# Patient Record
Sex: Male | Born: 1975 | Race: White | Hispanic: No | Marital: Married | Smoking: Current every day smoker
Health system: Southern US, Community
[De-identification: ages and names within clinical notes are randomized; demographics above are authoritative.]

## PROBLEM LIST (undated history)

## (undated) DIAGNOSIS — F1010 Alcohol abuse, uncomplicated: Secondary | ICD-10-CM

## (undated) DIAGNOSIS — F32A Depression, unspecified: Secondary | ICD-10-CM

## (undated) DIAGNOSIS — J189 Pneumonia, unspecified organism: Secondary | ICD-10-CM

## (undated) DIAGNOSIS — K219 Gastro-esophageal reflux disease without esophagitis: Secondary | ICD-10-CM

## (undated) DIAGNOSIS — J40 Bronchitis, not specified as acute or chronic: Secondary | ICD-10-CM

## (undated) DIAGNOSIS — Z87442 Personal history of urinary calculi: Secondary | ICD-10-CM

## (undated) DIAGNOSIS — F329 Major depressive disorder, single episode, unspecified: Secondary | ICD-10-CM

## (undated) DIAGNOSIS — E119 Type 2 diabetes mellitus without complications: Secondary | ICD-10-CM

## (undated) DIAGNOSIS — F419 Anxiety disorder, unspecified: Secondary | ICD-10-CM

## (undated) DIAGNOSIS — Z8489 Family history of other specified conditions: Secondary | ICD-10-CM

## (undated) DIAGNOSIS — F1910 Other psychoactive substance abuse, uncomplicated: Secondary | ICD-10-CM

## (undated) DIAGNOSIS — I10 Essential (primary) hypertension: Secondary | ICD-10-CM

## (undated) DIAGNOSIS — M109 Gout, unspecified: Secondary | ICD-10-CM

## (undated) DIAGNOSIS — R45851 Suicidal ideations: Secondary | ICD-10-CM

## (undated) HISTORY — PX: OTHER SURGICAL HISTORY: SHX169

## (undated) HISTORY — PX: UPPER GASTROINTESTINAL ENDOSCOPY: SHX188

## (undated) NOTE — *Deleted (*Deleted)
01/25/2020 8:50 AM   Alan Eaton 07/18/1975 [id number]  Referring provider: Barbette Reichmann, MD [address] [facility] [place],  [place] [address] No chief complaint on file.   HPI: Alan Eaton is a 40 y.o. male who presents today for evaluation and management of nocturnal enuresis.   -The patient saw Dr. Marcello Fennel back in 09/2019 for bilateral testicle pain. -Pain was better with elevation.  -Exam noted scrotum only had minimal swelling but no erythema, warmth, or tendernes -He reported going to the ED for testicle pain but imaging was stable with some chronic inflammatory changes from prior orchitis.  -He reported some dysuria and no hematuria. -He had some urinary frequency.  -Patient was treated with Levaquin 500 mg daily x 7 days. STI testing was negative.   1. Nocturnal enuresis   PMH: Past Medical History:  Diagnosis Date  . Alcohol abuse   . Bronchitis   . Depression   . Diabetes mellitus without complication (HCC)    lost weight diet mgmt at present  . Drug abuse (HCC)   . Family history of adverse reaction to anesthesia   . GERD (gastroesophageal reflux disease)   . Gout   . Hypertension   . Morbid obesity (HCC)   . Suicide ideation     Surgical History: Past Surgical History:  Procedure Laterality Date  . none    . TRICEPS TENDON REPAIR Right 09/26/2018   Procedure: Debridement and primary repair of partial-thickness right distal triceps tendon tear. ;  Surgeon: Christena Flake, MD;  Location: [facility] ORS;  Service: Orthopedics;  Laterality: Right;  . UPPER GASTROINTESTINAL ENDOSCOPY      Home Medications:  Allergies as of 01/25/2020      Reactions   Bee Venom Swelling   Bactrim [sulfamethoxazole-trimethoprim] Swelling, Rash      Medication List       Accurate as of January 24, 2020  8:50 AM. If you have any questions, ask your nurse or doctor.        albuterol 108 (90 Base) MCG/ACT inhaler Commonly known as: Ventolin HFA  Inhale 2 puffs into the lungs every 4 (four) hours as needed for wheezing or shortness of breath.   allopurinol 100 MG tablet Commonly known as: ZYLOPRIM Take 100 mg by mouth daily.   allopurinol 300 MG tablet Commonly known as: ZYLOPRIM Take 300 mg by mouth daily.   amLODipine 5 MG tablet Commonly known as: NORVASC Take 5 mg by mouth daily.   cloNIDine 0.1 MG tablet Commonly known as: CATAPRES Take 0.1 mg by mouth 2 (two) times daily.   colchicine 0.6 MG tablet Take 1 tablet (0.6 mg total) by mouth daily.   etodolac 500 MG tablet Commonly known as: LODINE Take 500 mg by mouth 2 (two) times daily.   furosemide 80 MG tablet Commonly known as: LASIX Take 80 mg by mouth daily.   gabapentin 300 MG capsule Commonly known as: NEURONTIN TAKE 1 CAPSULE BY MOUTH 3 TIMES DAILY   hydrOXYzine 50 MG tablet Commonly known as: ATARAX/VISTARIL Take 60 mg by mouth daily.   metoprolol succinate 50 MG 24 hr tablet Commonly known as: TOPROL-XL TAKE 1 TABLET BY MOUTH ONCE DAILY   NIFEdipine 60 MG 24 hr tablet Commonly known as: PROCARDIA XL/NIFEDICAL XL Take 1 tablet (60 mg total) by mouth daily.   pantoprazole 40 MG tablet Commonly known as: PROTONIX Take 1 tablet (40 mg total) by mouth daily.   promethazine 25 MG tablet Commonly known as:  PHENERGAN Take 25 mg by mouth as needed for nausea or vomiting.   traZODone 50 MG tablet Commonly known as: DESYREL TAKE 1 TABLET BY MOUTH AT BEDTIME AS NEEDED SLEEP   vitamin B-12 1000 MCG tablet Commonly known as: CYANOCOBALAMIN Take 1 tablet (1,000 mcg total) by mouth daily.   Vivitrol 380 MG Susr Generic drug: Naltrexone 380 mg every 30 (thirty) days.       Allergies:  Allergies  Allergen Reactions  . Bee Venom Swelling  . Bactrim [Sulfamethoxazole-Trimethoprim] Swelling and Rash    Family History: Family History  Problem Relation Age of Onset  . Hypertension Other   . Diabetes Mellitus II Other     Social History:   reports that he has been smoking cigarettes. He has been smoking about 1.00 pack per day. His smokeless tobacco use includes snuff. He reports previous alcohol use. He reports current drug use. Frequency: 1.00 time per week. Drugs: Marijuana and Cocaine.   Physical Exam: There were no vitals taken for this visit.  Constitutional:  Alert and oriented, No acute distress. HEENT: [place] AT, moist mucus membranes.  Trachea midline, no masses. Cardiovascular: No clubbing, cyanosis, or edema. Respiratory: Normal respiratory effort, no increased work of breathing. GI: Abdomen is soft, nontender, nondistended, no abdominal masses GU: No CVA tenderness Lymph: No cervical or inguinal lymphadenopathy. Skin: No rashes, bruises or suspicious lesions. Neurologic: Grossly intact, no focal deficits, moving all 4 extremities. Psychiatric: Normal mood and affect.  Laboratory Data:  Lab Results  Component Value Date   CREATININE 1.49 (H) 10/14/2019    No results found for: PSA  No results found for: TESTOSTERONE  Lab Results  Component Value Date   HGBA1C 6.0 06/14/2016    Urinalysis   Pertinent Imaging: *** No results found for this or any previous visit.  No results found for this or any previous visit.  No results found for this or any previous visit.  No results found for this or any previous visit.  No results found for this or any previous visit.  No results found for this or any previous visit.  No results found for this or any previous visit.  No results found for this or any previous visit.   Assessment & Plan:     No follow-ups on file.  [facility] [address], [address] [place], [place] [address] [phone]  I, Theador Hawthorne, am acting as a scribe for Dr. Lorin Picket C. Stoioff,  {Add Holiday representative

---

## 2007-05-08 ENCOUNTER — Ambulatory Visit: Payer: Self-pay | Admitting: Gastroenterology

## 2007-05-15 ENCOUNTER — Ambulatory Visit: Payer: Self-pay | Admitting: Gastroenterology

## 2008-02-22 ENCOUNTER — Emergency Department: Payer: Self-pay | Admitting: Unknown Physician Specialty

## 2009-04-06 ENCOUNTER — Emergency Department: Payer: Self-pay | Admitting: Emergency Medicine

## 2009-09-01 ENCOUNTER — Emergency Department: Payer: Self-pay | Admitting: Emergency Medicine

## 2010-02-10 ENCOUNTER — Emergency Department: Payer: Self-pay | Admitting: Emergency Medicine

## 2010-02-23 ENCOUNTER — Emergency Department: Payer: Self-pay | Admitting: Emergency Medicine

## 2010-09-12 ENCOUNTER — Emergency Department: Payer: Self-pay | Admitting: Emergency Medicine

## 2012-04-02 ENCOUNTER — Emergency Department: Payer: Self-pay | Admitting: Emergency Medicine

## 2012-08-03 ENCOUNTER — Ambulatory Visit: Payer: Self-pay | Admitting: Gastroenterology

## 2012-08-04 LAB — PATHOLOGY REPORT

## 2012-08-30 ENCOUNTER — Ambulatory Visit: Payer: Self-pay | Admitting: Bariatrics

## 2012-08-30 LAB — CBC WITH DIFFERENTIAL/PLATELET
Basophil #: 0 10*3/uL (ref 0.0–0.1)
Basophil %: 0.2 %
Eosinophil %: 4 %
HCT: 41.1 % (ref 40.0–52.0)
HGB: 13.4 g/dL (ref 13.0–18.0)
Lymphocyte #: 2.7 10*3/uL (ref 1.0–3.6)
Lymphocyte %: 19.3 %
MCH: 28.5 pg (ref 26.0–34.0)
MCHC: 32.7 g/dL (ref 32.0–36.0)
MCV: 87 fL (ref 80–100)
Monocyte %: 6.6 %
Neutrophil #: 9.8 10*3/uL — ABNORMAL HIGH (ref 1.4–6.5)
RBC: 4.71 10*6/uL (ref 4.40–5.90)
WBC: 13.9 10*3/uL — ABNORMAL HIGH (ref 3.8–10.6)

## 2012-08-30 LAB — COMPREHENSIVE METABOLIC PANEL
Albumin: 3.7 g/dL (ref 3.4–5.0)
Alkaline Phosphatase: 77 U/L (ref 50–136)
Calcium, Total: 9.4 mg/dL (ref 8.5–10.1)
Chloride: 105 mmol/L (ref 98–107)
Co2: 27 mmol/L (ref 21–32)
Creatinine: 1.28 mg/dL (ref 0.60–1.30)
EGFR (Non-African Amer.): >60
Glucose: 121 mg/dL — ABNORMAL HIGH (ref 65–99)
Osmolality: 275 (ref 275–301)
Potassium: 3.6 mmol/L (ref 3.5–5.1)
SGOT(AST): 17 U/L (ref 15–37)
SGPT (ALT): 38 U/L (ref 12–78)
Total Protein: 8.4 g/dL — ABNORMAL HIGH (ref 6.4–8.2)

## 2012-08-30 LAB — APTT: Activated PTT: 31.3 secs (ref 23.6–35.9)

## 2012-08-30 LAB — IRON AND TIBC
Iron Bind.Cap.(Total): 339 ug/dL (ref 250–450)
Iron Saturation: 6 %
Iron: 22 ug/dL — ABNORMAL LOW (ref 65–175)
Unbound Iron-Bind.Cap.: 317 ug/dL

## 2012-08-30 LAB — FOLATE: Folic Acid: 40.2 ng/mL (ref 3.1–100.0)

## 2012-08-30 LAB — HEMOGLOBIN A1C: Hemoglobin A1C: 6.3 % (ref 4.2–6.3)

## 2012-08-30 LAB — PROTIME-INR: INR: 1

## 2012-08-30 LAB — MAGNESIUM: Magnesium: 1.5 mg/dL — ABNORMAL LOW

## 2012-08-30 LAB — TSH: Thyroid Stimulating Horm: 1.95 u[IU]/mL

## 2012-08-30 LAB — LIPASE, BLOOD: Lipase: 206 U/L (ref 73–393)

## 2012-09-10 ENCOUNTER — Emergency Department: Payer: Self-pay | Admitting: Internal Medicine

## 2012-09-15 ENCOUNTER — Ambulatory Visit: Payer: Self-pay | Admitting: Family Medicine

## 2012-10-22 ENCOUNTER — Inpatient Hospital Stay: Payer: Self-pay | Admitting: Psychiatry

## 2012-10-22 LAB — URINALYSIS, COMPLETE
Bilirubin,UR: NEGATIVE
Blood: NEGATIVE
Glucose,UR: NEGATIVE mg/dL (ref 0–75)
Ketone: NEGATIVE
Leukocyte Esterase: NEGATIVE
Nitrite: NEGATIVE
Specific Gravity: 1.003 (ref 1.003–1.030)

## 2012-10-22 LAB — CBC
HGB: 14.3 g/dL (ref 13.0–18.0)
MCHC: 33.9 g/dL (ref 32.0–36.0)
MCV: 85 fL (ref 80–100)
Platelet: 333 10*3/uL (ref 150–440)
Platelet: 336 10*3/uL (ref 150–440)
RBC: 4.77 10*6/uL (ref 4.40–5.90)
RDW: 15.5 % — ABNORMAL HIGH (ref 11.5–14.5)
WBC: 12.1 10*3/uL — ABNORMAL HIGH (ref 3.8–10.6)

## 2012-10-22 LAB — COMPREHENSIVE METABOLIC PANEL
Albumin: 3.6 g/dL (ref 3.4–5.0)
Anion Gap: 7 (ref 7–16)
BUN: 6 mg/dL — ABNORMAL LOW (ref 7–18)
Chloride: 108 mmol/L — ABNORMAL HIGH (ref 98–107)
Co2: 27 mmol/L (ref 21–32)
Creatinine: 1.29 mg/dL (ref 0.60–1.30)
EGFR (African American): >60
EGFR (African American): >60
EGFR (Non-African Amer.): >60
EGFR (Non-African Amer.): >60
Glucose: 102 mg/dL — ABNORMAL HIGH (ref 65–99)
Osmolality: 281 (ref 275–301)
Potassium: 3.7 mmol/L (ref 3.5–5.1)
Sodium: 142 mmol/L (ref 136–145)
Total Protein: 8.4 g/dL — ABNORMAL HIGH (ref 6.4–8.2)

## 2012-10-22 LAB — DRUG SCREEN, URINE
MDMA (Ecstasy)Ur Screen: NEGATIVE (ref ?–500)
Methadone, Ur Screen: NEGATIVE (ref ?–300)
Opiate, Ur Screen: NEGATIVE (ref ?–300)

## 2012-11-29 ENCOUNTER — Other Ambulatory Visit: Payer: Self-pay | Admitting: Bariatrics

## 2012-11-29 DIAGNOSIS — R12 Heartburn: Secondary | ICD-10-CM

## 2012-12-05 ENCOUNTER — Other Ambulatory Visit: Payer: Self-pay

## 2012-12-26 ENCOUNTER — Other Ambulatory Visit: Payer: Self-pay

## 2013-01-05 ENCOUNTER — Inpatient Hospital Stay: Admission: RE | Admit: 2013-01-05 | Payer: Self-pay | Source: Ambulatory Visit

## 2013-02-02 ENCOUNTER — Ambulatory Visit: Payer: Self-pay | Admitting: Bariatrics

## 2013-08-17 ENCOUNTER — Ambulatory Visit: Payer: Self-pay | Admitting: Bariatrics

## 2013-10-12 ENCOUNTER — Emergency Department: Payer: Self-pay | Admitting: Emergency Medicine

## 2013-10-12 LAB — CBC
HCT: 41.5 % (ref 40.0–52.0)
HGB: 13.4 g/dL (ref 13.0–18.0)
MCH: 28.9 pg (ref 26.0–34.0)
MCHC: 32.3 g/dL (ref 32.0–36.0)
MCV: 89 fL (ref 80–100)
Platelet: 350 10*3/uL (ref 150–440)
RBC: 4.64 10*6/uL (ref 4.40–5.90)
RDW: 16.1 % — ABNORMAL HIGH (ref 11.5–14.5)
WBC: 14.7 10*3/uL — ABNORMAL HIGH (ref 3.8–10.6)

## 2013-10-12 LAB — COMPREHENSIVE METABOLIC PANEL
ALBUMIN: 3.4 g/dL (ref 3.4–5.0)
ALT: 41 U/L
ANION GAP: 8 (ref 7–16)
Alkaline Phosphatase: 65 U/L
BUN: 5 mg/dL — ABNORMAL LOW (ref 7–18)
Bilirubin,Total: 0.2 mg/dL (ref 0.2–1.0)
Calcium, Total: 9 mg/dL (ref 8.5–10.1)
Chloride: 106 mmol/L (ref 98–107)
Co2: 26 mmol/L (ref 21–32)
Creatinine: 1.29 mg/dL (ref 0.60–1.30)
GLUCOSE: 75 mg/dL (ref 65–99)
Osmolality: 275 (ref 275–301)
Potassium: 3.4 mmol/L — ABNORMAL LOW (ref 3.5–5.1)
SGOT(AST): 28 U/L (ref 15–37)
SODIUM: 140 mmol/L (ref 136–145)
Total Protein: 8.3 g/dL — ABNORMAL HIGH (ref 6.4–8.2)

## 2013-10-12 LAB — URINALYSIS, COMPLETE
BILIRUBIN, UR: NEGATIVE
Bacteria: NONE SEEN
Blood: NEGATIVE
GLUCOSE, UR: NEGATIVE mg/dL (ref 0–75)
Ketone: NEGATIVE
LEUKOCYTE ESTERASE: NEGATIVE
Nitrite: NEGATIVE
Ph: 6 (ref 4.5–8.0)
Protein: NEGATIVE
RBC,UR: NONE SEEN /HPF (ref 0–5)
Specific Gravity: 1.001 (ref 1.003–1.030)
Squamous Epithelial: NONE SEEN
WBC UR: <1 /HPF (ref 0–5)

## 2013-10-12 LAB — CBC WITH DIFFERENTIAL/PLATELET
BASOS ABS: 0.1 10*3/uL (ref 0.0–0.1)
BASOS PCT: 0.7 %
EOS ABS: 0.8 10*3/uL — AB (ref 0.0–0.7)
EOS PCT: 5.4 %
LYMPHS ABS: 3.8 10*3/uL — AB (ref 1.0–3.6)
Lymphocyte %: 25.9 %
Monocyte #: 1.2 x10 3/mm — ABNORMAL HIGH (ref 0.2–1.0)
Monocyte %: 8.2 %
NEUTROS PCT: 59.8 %
Neutrophil #: 8.8 10*3/uL — ABNORMAL HIGH (ref 1.4–6.5)

## 2013-10-12 LAB — ETHANOL
ETHANOL %: 0.161 % — AB (ref 0.000–0.080)
ETHANOL LVL: 161 mg/dL

## 2013-10-12 LAB — PROTIME-INR
INR: 1
Prothrombin Time: 13.1 secs (ref 11.5–14.7)

## 2013-10-12 LAB — PRO B NATRIURETIC PEPTIDE: B-TYPE NATIURETIC PEPTID: 32 pg/mL (ref 0–125)

## 2013-10-12 LAB — TROPONIN I: Troponin-I: <0.02 ng/mL

## 2013-10-12 LAB — D-DIMER(ARMC): D-DIMER: 2047 ng/mL

## 2014-01-26 ENCOUNTER — Emergency Department: Payer: Self-pay | Admitting: Emergency Medicine

## 2014-06-12 DIAGNOSIS — Z008 Encounter for other general examination: Secondary | ICD-10-CM | POA: Diagnosis not present

## 2014-07-10 ENCOUNTER — Ambulatory Visit
Admit: 2014-07-10 | Disposition: A | Discharge status: Home / Self Care | Payer: Self-pay | Attending: Family Medicine | Admitting: Family Medicine

## 2014-07-12 NOTE — Discharge Summary (Signed)
PATIENT NAME:  Alan Eaton, Alan Eaton MR#:  [id number] DATE OF BIRTH:  Jan 17, 1976  HOSPITAL COURSE: See dictated history and physical for details of admission. This 39 year old gentleman was admitted to through the Emergency Room intoxicated and making hostile, agitated statements. The patient was irritable and uncooperative at first. He was admitted and treated for alcohol withdrawal. He was able to detox without difficulty, not requiring any significant amount of medication. No sign of seizures or delirium.   At first, he stayed withdrawn and somewhat isolated and was irritated when he spoke to his family on the phone. I spoke with his mother who reports that the patient has a long-standing problem with drinking and of acting angry and hostile when he is intoxicated. There is no evident history of suicidal behavior. No history of psychosis.   The patient is not reporting a syndrome of major depression and does not appear to be psychotic. No psychiatric medication was started for mood or anxiety treatment during his hospital treatment stay.   The patient was engaged in groups for substance abuse education and had a social work evaluation. The patient also has been educated about the fact that if he continues to drink, he is unlikely to be a candidate for gastric bypass surgery, which is something he had been looking forward to. He expresses an understanding of that.   At the time of discharge, he has calmed down and is no longer making hostile angry statements. His affect is more appropriate. He is agreeable to going to outpatient treatment at [facility] in their intensive outpatient program for substance abuse (Dictation Anomaly)<<He show improvedMISSING TEXT>> insight. He is looking forward to getting home and being with his girlfriend and family.   MENTAL STATUS: Casually dressed, somewhat disheveled gentleman, looks his stated age, cooperative but passive in the interview. Eye contact normal. Psychomotor  activity a little slow. Speech decreased in total amount but easy to understand, more than when he came in. Affect is euthymic and appropriate. Mood is stated as being better. Thoughts appear to be lucid. No loosening of associations or evidence of psychotic thinking. Denies hallucinations. Denies suicidal or homicidal ideation. Judgment and insight are improved. Intelligence probably low average. Alert and oriented x4.   DISCHARGE MEDICATIONS: Reglan 10 mg one tablet with each meal and at bedtime, metoprolol extended release 25 mg once a day, Pepcid 40 mg at night, omeprazole 40 mg at night.   LABORATORY RESULTS: Admission labs showed an alcohol level of 187. TSH normal at 2.0. Chemistry panel showed a slightly elevated glucose at 102, chloride slightly elevated 108, total protein 8.4. Drug screen was negative. CBC: Elevated white count at 13.2 which improved to 12.1 on follow-up draw. Urinalysis unremarkable.   DISPOSITION: The patient is to be discharged home and will follow up with [facility].   DIAGNOSIS, PRINCIPAL AND PRIMARY:  AXIS I: Alcohol dependence.   SECONDARY DIAGNOSES:  AXIS I: Substance-induced mood disorder.  AXIS II: Rule out developmental disorder.  AXIS III: Morbid obesity, gastric motility issues, chronic reflux.  AXIS IV: Severe from chronic impairment and poor relationships with family.  AXIS V: Functioning at time of discharge is 53.    ____________________________ Gonzella Lex, MD jtc:np D: 10/25/2012 14:54:57 ET T: 10/25/2012 20:57:27 ET JOB#: [id number]  cc: Gonzella Lex, MD, <Dictator> Gonzella Lex MD ELECTRONICALLY SIGNED 10/26/2012 11:18

## 2014-07-12 NOTE — H&P (Signed)
PATIENT NAME:  Alan Eaton, Alan Eaton MR#:  [id number] DATE OF BIRTH:  05-23-1975  DATE OF ADMISSION:  10/22/2012  IDENTIFYING INFORMATION AND CHIEF COMPLAINT: A 39 year old man came into the Emergency Room intoxicated making suicidal statements. He tells me today, "I'm just under a lot of stress."   HISTORY OF PRESENT ILLNESS: Information is obtained from the patient and the chart. The patient came into the Emergency Room last night intoxicated. He was stating that he felt like he was under a lot of stress, was very anxious, was at the end of his rope and was having suicidal thoughts. He tells me today that he was having serious suicidal thoughts yesterday but that he is not sure about it today. His mood has been feeling stressed and anxious recently. He blames his anxiety primarily on his interaction with his girlfriend and her family. He is living with his girlfriend and they are both living off his Social Security check. He argues with her and with her family and it gets on his nerves. He has been drinking a little bit more. He tended to minimize it a bit to me but admitted that he was drinking a lot yesterday and says that he drinks whenever he gets his check at the beginning of the month. His mood has been anxious and depressed. His sleep is intermittently poor. He feels chronically sick and has problems with nausea of unclear cause. He is not currently getting any outpatient psychiatric treatment.   PAST PSYCHIATRIC HISTORY: No previous psych admissions. No history of suicide attempts. Denies history of violence. He has been to our hospital multiple times in the past intoxicated. He has been to [facility] for outpatient evaluation but has not followed up with treatment. He has never been in any kind of substance abuse treatment regularly. He says he has never really seriously tried to stop drinking in the past. He denies any history of seizures or delirium tremens. He says that once he was told that he  might have pancreatitis, which worried him, but he has continued drinking. He does not report ever being diagnosed or treated for any other psychiatric condition.   SUBSTANCE ABUSE HISTORY: Been drinking since he was a teenager. Says that he usually binge drinks just a few times a month but when he does gets quite sick and intoxicated. Questionable history of pancreatitis, although he has never been clearly diagnosed. Denies that he abuses any other drugs.   SOCIAL HISTORY: The patient does not work. He gets Disability. He lives with his girlfriend. He has been married in the past, has no children.  PAST MEDICAL HISTORY: The patient is morbidly obese and apparently is scheduled to have a gastric bypass operation coming up sometime soon. He suffers from chronic nausea and vomiting of unclear etiology. He takes several medicines for it, also has high blood pressure.   FAMILY HISTORY: No family history identified.   CURRENT MEDICATIONS: Zofran 8 mg p.o. q. 8 hours p.r.n. nausea, omeprazole 40 mg once a day, Pepcid 40 mg once a day, Phenergan 25 mg p.o. p.r.n. nausea and vomiting, metoclopramide 10 mg at bedtime and atenolol 25 mg once a day.   ALLERGIES: BACTRIM.   REVIEW OF SYSTEMS: Complains of feeling sick to his stomach nauseated, anxious, nervous, feeling unsafe about going home, denies hallucinations. Denies acute suicidal intent or plan.   MENTAL STATUS EXAMINATION: Disheveled overweight gentleman interviewed in a hospital room. He was asleep when I went in but woke up. He was only  passively cooperative with the interview. Eye contact was poor. Psychomotor activity sluggish. Speech quite and decreased in amount. Affect flat. Mood stated as being stressed. Thoughts are lucid but somewhat slow. Denies any hallucinations. No evidence of delusions or delirium. Denies suicidal or homicidal ideation currently. Baseline intelligence somewhat impaired. He said that he has been diagnosed as being "slow" in  the past. Alert and oriented. Judgment and insight adequate at least for the moment.   PHYSICAL EXAMINATION: GENERAL: Morbidly obese gentleman, weighs 360 pounds.  SKIN: No acute skin lesions identified.  HEENT: Pupils equal and reactive. Face symmetric. Oral mucosa dry. Full range of motion at all extremities. Normal gait. Normal strength and reflexes symmetrically and cranial nerves symmetric and normal.  LUNGS: Clear with no wheezes.  HEART: Regular rate and rhythm.  ABDOMEN: Soft, nontender, normal bowel sounds.  VITAL SIGNS: Most recent show a temperature of 97.9, pulse 116, respirations 19, blood pressure 119/67.   LABORATORY RESULTS: Alcohol level when he came in last night 187. TSH normal at 2. Chemistry shows elevated protein 8.4, elevated chloride 108, BUN low at 6. CBC shows white count elevated at 13.2. No anemia. Drug screen negative. Urinalysis unremarkable.  ASSESSMENT: A 39 year old man with alcohol dependence who presented to the hospital making suicidal statements. He is still tachycardiac and feeling a little unsteady and anxious and overwhelmed. He has not been able to stop drinking with referrals for outpatient treatment. The patient will be admitted to the hospital for detox and stabilization.   TREATMENT PLAN: Usual detox protocol in place. Continue outpatient medicine. Try and get collateral history, if possible. Engage him in groups and activities especially focused on substance abuse treatment and discuss further substance abuse options with him.   DIAGNOSIS, PRINCIPAL AND PRIMARY:  AXIS I: Alcohol dependence.   SECONDARY DIAGNOSES: AXIS I: Substance-induced anxiety.   AXIS II: Deferred.   AXIS III: Obesity, nausea chronically of unclear cause.   AXIS IV: Severe from lack of resources and conflicts at home.   AXIS V: Functioning at time of evaluation 65.    ____________________________ Gonzella Lex, MD jtc:cb D: 10/22/2012 16:30:16 ET T: 10/22/2012  17:40:48 ET JOB#: [id number]  cc: Gonzella Lex, MD, <Dictator> Gonzella Lex MD ELECTRONICALLY SIGNED 10/22/2012 18:33

## 2014-07-16 DIAGNOSIS — I10 Essential (primary) hypertension: Secondary | ICD-10-CM | POA: Diagnosis not present

## 2014-07-16 DIAGNOSIS — J302 Other seasonal allergic rhinitis: Secondary | ICD-10-CM | POA: Diagnosis not present

## 2014-07-16 DIAGNOSIS — E119 Type 2 diabetes mellitus without complications: Secondary | ICD-10-CM | POA: Diagnosis not present

## 2014-07-16 DIAGNOSIS — J439 Emphysema, unspecified: Secondary | ICD-10-CM | POA: Diagnosis not present

## 2014-08-08 ENCOUNTER — Other Ambulatory Visit: Payer: Self-pay | Admitting: Family Medicine

## 2014-08-08 DIAGNOSIS — J439 Emphysema, unspecified: Secondary | ICD-10-CM

## 2014-08-15 ENCOUNTER — Ambulatory Visit: Payer: Medicare Other | Attending: Family Medicine

## 2014-10-28 ENCOUNTER — Other Ambulatory Visit: Payer: Self-pay | Admitting: Family Medicine

## 2014-10-28 MED ORDER — METFORMIN HCL 500 MG PO TABS: 500.0000 mg | ORAL_TABLET | Freq: Every day | ORAL | Status: DC

## 2014-11-26 ENCOUNTER — Other Ambulatory Visit: Payer: Self-pay

## 2014-11-26 ENCOUNTER — Other Ambulatory Visit: Payer: Self-pay | Admitting: Family Medicine

## 2014-11-26 DIAGNOSIS — K219 Gastro-esophageal reflux disease without esophagitis: Secondary | ICD-10-CM

## 2014-11-26 MED ORDER — OMEPRAZOLE 20 MG PO CPDR: DELAYED_RELEASE_CAPSULE | ORAL | Status: DC

## 2014-11-26 NOTE — Progress Notes (Signed)
This medication was refilled to [facility]

## 2014-12-23 ENCOUNTER — Other Ambulatory Visit: Payer: Self-pay | Admitting: Family Medicine

## 2015-01-21 ENCOUNTER — Other Ambulatory Visit: Payer: Self-pay | Admitting: Family Medicine

## 2015-01-23 ENCOUNTER — Emergency Department: Payer: Medicare Other

## 2015-01-23 ENCOUNTER — Encounter: Payer: Self-pay | Admitting: Emergency Medicine

## 2015-01-23 ENCOUNTER — Inpatient Hospital Stay
Admission: EM | Admit: 2015-01-23 | Discharge: 2015-01-24 | DRG: 872 | Disposition: A | Discharge status: Home / Self Care | Payer: Medicare Other | Attending: Internal Medicine | Admitting: Internal Medicine

## 2015-01-23 DIAGNOSIS — A419 Sepsis, unspecified organism: Principal | ICD-10-CM | POA: Diagnosis present

## 2015-01-23 DIAGNOSIS — M19072 Primary osteoarthritis, left ankle and foot: Secondary | ICD-10-CM | POA: Diagnosis not present

## 2015-01-23 DIAGNOSIS — L03116 Cellulitis of left lower limb: Secondary | ICD-10-CM | POA: Diagnosis present

## 2015-01-23 DIAGNOSIS — I10 Essential (primary) hypertension: Secondary | ICD-10-CM | POA: Diagnosis not present

## 2015-01-23 DIAGNOSIS — Z6838 Body mass index (BMI) 38.0-38.9, adult: Secondary | ICD-10-CM | POA: Diagnosis not present

## 2015-01-23 DIAGNOSIS — M6281 Muscle weakness (generalized): Secondary | ICD-10-CM | POA: Diagnosis not present

## 2015-01-23 DIAGNOSIS — M79605 Pain in left leg: Secondary | ICD-10-CM | POA: Diagnosis not present

## 2015-01-23 DIAGNOSIS — K219 Gastro-esophageal reflux disease without esophagitis: Secondary | ICD-10-CM | POA: Diagnosis present

## 2015-01-23 DIAGNOSIS — R079 Chest pain, unspecified: Secondary | ICD-10-CM | POA: Diagnosis not present

## 2015-01-23 DIAGNOSIS — M79602 Pain in left arm: Secondary | ICD-10-CM

## 2015-01-23 DIAGNOSIS — E119 Type 2 diabetes mellitus without complications: Secondary | ICD-10-CM | POA: Diagnosis present

## 2015-01-23 DIAGNOSIS — F17210 Nicotine dependence, cigarettes, uncomplicated: Secondary | ICD-10-CM | POA: Diagnosis present

## 2015-01-23 DIAGNOSIS — M1000 Idiopathic gout, unspecified site: Secondary | ICD-10-CM | POA: Diagnosis present

## 2015-01-23 DIAGNOSIS — M10072 Idiopathic gout, left ankle and foot: Secondary | ICD-10-CM | POA: Diagnosis not present

## 2015-01-23 HISTORY — DX: Type 2 diabetes mellitus without complications: E11.9

## 2015-01-23 HISTORY — DX: Essential (primary) hypertension: I10

## 2015-01-23 HISTORY — DX: Gout, unspecified: M10.9

## 2015-01-23 HISTORY — DX: Gastro-esophageal reflux disease without esophagitis: K21.9

## 2015-01-23 LAB — LACTIC ACID, PLASMA
Lactic Acid, Venous: 1.6 mmol/L (ref 0.5–2.0)
Lactic Acid, Venous: 0.7 mmol/L (ref 0.5–2.0)

## 2015-01-23 LAB — CBC
HCT: 37.2 % — ABNORMAL LOW (ref 40.0–52.0)
Hemoglobin: 12.1 g/dL — ABNORMAL LOW (ref 13.0–18.0)
MCH: 29.2 pg (ref 26.0–34.0)
MCHC: 32.7 g/dL (ref 32.0–36.0)
MCV: 89.3 fL (ref 80.0–100.0)
PLATELETS: 284 10*3/uL (ref 150–440)
RBC: 4.16 MIL/uL — ABNORMAL LOW (ref 4.40–5.90)
RDW: 15.3 % — AB (ref 11.5–14.5)
WBC: 15.6 10*3/uL — AB (ref 3.8–10.6)

## 2015-01-23 LAB — BASIC METABOLIC PANEL
Anion gap: 4 — ABNORMAL LOW (ref 5–15)
BUN: 13 mg/dL (ref 6–20)
CHLORIDE: 106 mmol/L (ref 101–111)
CO2: 30 mmol/L (ref 22–32)
CREATININE: 1.22 mg/dL (ref 0.61–1.24)
Calcium: 8.9 mg/dL (ref 8.9–10.3)
GFR calc Af Amer: >60 mL/min (ref >60)
GFR calc non Af Amer: >60 mL/min (ref >60)
GLUCOSE: 106 mg/dL — AB (ref 65–99)
Potassium: 3.8 mmol/L (ref 3.5–5.1)
SODIUM: 140 mmol/L (ref 135–145)

## 2015-01-23 LAB — TROPONIN I
Troponin I: <0.03 ng/mL (ref <0.031)
Troponin I: <0.03 ng/mL (ref <0.031)

## 2015-01-23 LAB — HEMOGLOBIN A1C: Hgb A1c MFr Bld: 6.4 % — ABNORMAL HIGH (ref 4.0–6.0)

## 2015-01-23 LAB — GLUCOSE, CAPILLARY
Glucose-Capillary: 110 mg/dL — ABNORMAL HIGH (ref 65–99)
Glucose-Capillary: 150 mg/dL — ABNORMAL HIGH (ref 65–99)
Glucose-Capillary: 97 mg/dL (ref 65–99)

## 2015-01-23 LAB — URIC ACID: Uric Acid, Serum: 9 mg/dL — ABNORMAL HIGH (ref 4.4–7.6)

## 2015-01-23 LAB — TSH: TSH: 2.132 u[IU]/mL (ref 0.350–4.500)

## 2015-01-23 MED ORDER — SODIUM CHLORIDE 0.9 % IJ SOLN
3.0000 mL | Freq: Two times a day (BID) | INTRAMUSCULAR | Status: DC
  Administered 2015-01-23 – 2015-01-24 (×2): 3 mL via INTRAVENOUS

## 2015-01-23 MED ORDER — DOCUSATE SODIUM 100 MG PO CAPS
100.0000 mg | ORAL_CAPSULE | Freq: Two times a day (BID) | ORAL | Status: DC
  Administered 2015-01-23 – 2015-01-24 (×2): 100 mg via ORAL
  Filled 2015-01-23 (×3): qty 1

## 2015-01-23 MED ORDER — LORATADINE 10 MG PO TABS
10.0000 mg | ORAL_TABLET | Freq: Every day | ORAL | Status: DC
  Administered 2015-01-23 – 2015-01-24 (×2): 10 mg via ORAL
  Filled 2015-01-23 (×2): qty 1

## 2015-01-23 MED ORDER — ONDANSETRON HCL 4 MG/2ML IJ SOLN
4.0000 mg | Freq: Four times a day (QID) | INTRAMUSCULAR | Status: DC | PRN
Start: 2015-01-23 — End: 2015-01-24

## 2015-01-23 MED ORDER — LOSARTAN POTASSIUM 50 MG PO TABS
100.0000 mg | ORAL_TABLET | Freq: Every day | ORAL | Status: DC
  Administered 2015-01-23 – 2015-01-24 (×2): 100 mg via ORAL
  Filled 2015-01-23 (×2): qty 2

## 2015-01-23 MED ORDER — IPRATROPIUM-ALBUTEROL 0.5-2.5 (3) MG/3ML IN SOLN
3.0000 mL | Freq: Four times a day (QID) | RESPIRATORY_TRACT | Status: DC
  Administered 2015-01-23 – 2015-01-24 (×3): 3 mL via RESPIRATORY_TRACT
  Filled 2015-01-23 (×4): qty 3

## 2015-01-23 MED ORDER — MORPHINE SULFATE (PF) 2 MG/ML IV SOLN
2.0000 mg | INTRAVENOUS | Status: DC | PRN
  Administered 2015-01-23: 11:00:00 2 mg via INTRAVENOUS
  Filled 2015-01-23: qty 1

## 2015-01-23 MED ORDER — METOPROLOL SUCCINATE ER 50 MG PO TB24
50.0000 mg | ORAL_TABLET | Freq: Every day | ORAL | Status: DC
Start: 2015-01-23 — End: 2015-01-24
  Administered 2015-01-23 – 2015-01-24 (×2): 50 mg via ORAL
  Filled 2015-01-23 (×2): qty 1

## 2015-01-23 MED ORDER — NICOTINE 21 MG/24HR TD PT24
21.0000 mg | MEDICATED_PATCH | Freq: Every day | TRANSDERMAL | Status: DC
  Filled 2015-01-23 (×2): qty 1

## 2015-01-23 MED ORDER — ENOXAPARIN SODIUM 30 MG/0.3ML ~~LOC~~ SOLN
30.0000 mg | Freq: Two times a day (BID) | SUBCUTANEOUS | Status: DC
  Administered 2015-01-23: 11:00:00 30 mg via SUBCUTANEOUS
  Filled 2015-01-23: qty 0.3

## 2015-01-23 MED ORDER — SODIUM CHLORIDE 0.9 % IV BOLUS (SEPSIS): 1000.0000 mL | Freq: Once | INTRAVENOUS | Status: DC

## 2015-01-23 MED ORDER — VANCOMYCIN HCL 10 G IV SOLR
1500.0000 mg | Freq: Once | INTRAVENOUS | Status: AC
  Administered 2015-01-23: 1500 mg via INTRAVENOUS
  Filled 2015-01-23: qty 1500

## 2015-01-23 MED ORDER — ACETAMINOPHEN 650 MG RE SUPP: 650.0000 mg | Freq: Four times a day (QID) | RECTAL | Status: DC | PRN

## 2015-01-23 MED ORDER — ASPIRIN EC 81 MG PO TBEC
81.0000 mg | DELAYED_RELEASE_TABLET | Freq: Every day | ORAL | Status: DC
  Administered 2015-01-23 – 2015-01-24 (×2): 81 mg via ORAL
  Filled 2015-01-23 (×2): qty 1

## 2015-01-23 MED ORDER — DICYCLOMINE HCL 20 MG PO TABS
20.0000 mg | ORAL_TABLET | Freq: Every day | ORAL | Status: DC
  Administered 2015-01-23 – 2015-01-24 (×2): 20 mg via ORAL
  Filled 2015-01-23 (×2): qty 1

## 2015-01-23 MED ORDER — PANTOPRAZOLE SODIUM 40 MG PO TBEC
40.0000 mg | DELAYED_RELEASE_TABLET | Freq: Two times a day (BID) | ORAL | Status: DC
  Administered 2015-01-23 – 2015-01-24 (×2): 40 mg via ORAL
  Filled 2015-01-23 (×2): qty 1

## 2015-01-23 MED ORDER — PROMETHAZINE HCL 25 MG PO TABS: 25.0000 mg | ORAL_TABLET | Freq: Four times a day (QID) | ORAL | Status: DC | PRN

## 2015-01-23 MED ORDER — PROMETHAZINE HCL 25 MG PO TABS
25.0000 mg | ORAL_TABLET | Freq: Two times a day (BID) | ORAL | Status: DC
  Administered 2015-01-23 – 2015-01-24 (×2): 25 mg via ORAL
  Filled 2015-01-23 (×2): qty 1

## 2015-01-23 MED ORDER — FAMOTIDINE 20 MG PO TABS
40.0000 mg | ORAL_TABLET | Freq: Every day | ORAL | Status: DC
  Administered 2015-01-23: 23:00:00 40 mg via ORAL
  Filled 2015-01-23: qty 2

## 2015-01-23 MED ORDER — ENOXAPARIN SODIUM 40 MG/0.4ML ~~LOC~~ SOLN
40.0000 mg | Freq: Two times a day (BID) | SUBCUTANEOUS | Status: DC
  Administered 2015-01-23 – 2015-01-24 (×2): 40 mg via SUBCUTANEOUS
  Filled 2015-01-23 (×2): qty 0.4

## 2015-01-23 MED ORDER — BENZONATATE 100 MG PO CAPS
100.0000 mg | ORAL_CAPSULE | Freq: Three times a day (TID) | ORAL | Status: DC
  Administered 2015-01-23 – 2015-01-24 (×3): 100 mg via ORAL
  Filled 2015-01-23 (×3): qty 1

## 2015-01-23 MED ORDER — ACETAMINOPHEN 325 MG PO TABS
650.0000 mg | ORAL_TABLET | Freq: Four times a day (QID) | ORAL | Status: DC | PRN
  Administered 2015-01-24: 650 mg via ORAL
  Filled 2015-01-23: qty 2

## 2015-01-23 MED ORDER — INSULIN ASPART 100 UNIT/ML ~~LOC~~ SOLN
0.0000 [IU] | Freq: Three times a day (TID) | SUBCUTANEOUS | Status: DC
  Administered 2015-01-23: 18:00:00 2 [IU] via SUBCUTANEOUS
  Filled 2015-01-23: qty 2

## 2015-01-23 MED ORDER — PANTOPRAZOLE SODIUM 40 MG PO TBEC
40.0000 mg | DELAYED_RELEASE_TABLET | Freq: Every day | ORAL | Status: DC
  Administered 2015-01-23: 11:00:00 40 mg via ORAL
  Filled 2015-01-23: qty 1

## 2015-01-23 MED ORDER — SODIUM CHLORIDE 0.9 % IV BOLUS (SEPSIS)
1000.0000 mL | Freq: Once | INTRAVENOUS | Status: AC
Start: 2015-01-23 — End: 2015-01-23
  Administered 2015-01-23: 1000 mL via INTRAVENOUS
  Filled 2015-01-23: qty 1000

## 2015-01-23 MED ORDER — SODIUM CHLORIDE 0.9 % IV SOLN
INTRAVENOUS | Status: DC
  Administered 2015-01-23 (×2): via INTRAVENOUS

## 2015-01-23 MED ORDER — ONDANSETRON HCL 4 MG PO TABS: 4.0000 mg | ORAL_TABLET | Freq: Four times a day (QID) | ORAL | Status: DC | PRN

## 2015-01-23 MED ORDER — SODIUM CHLORIDE 0.9 % IV SOLN
1500.0000 mg | Freq: Two times a day (BID) | INTRAVENOUS | Status: DC
  Administered 2015-01-23 – 2015-01-24 (×3): 1500 mg via INTRAVENOUS
  Filled 2015-01-23 (×4): qty 1500

## 2015-01-23 MED ORDER — MORPHINE SULFATE (PF) 4 MG/ML IV SOLN
4.0000 mg | Freq: Once | INTRAVENOUS | Status: AC
  Administered 2015-01-23: 4 mg via INTRAVENOUS
  Filled 2015-01-23: qty 1

## 2015-01-23 NOTE — H&P (Signed)
Alan Eaton is an 39 y.o. male.   Chief Complaint: Foot pain HPI: The patient presents emergency department complaining of a tender left foot that has been swelling for the last 3 days. He denies fever, nausea or vomiting. He admits that he walks around barefoot and may have injured his foot but does not specifically recall an incident in which he sustained a wound or laceration. The emergency department the patient was found to have elevated white blood cell count as well as persistent tachycardia which prompted emergency department staff to call for admission.  Past Medical History  Diagnosis Date  . Gout   . GERD (gastroesophageal reflux disease)   . Hypertension     Past Surgical History  Procedure Laterality Date  . None      Family History  Problem Relation Age of Onset  . Hypertension    . Diabetes Mellitus II     Social History:  reports that he has been smoking Cigarettes.  He has been smoking about 0.50 packs per day. He does not have any smokeless tobacco history on file. He reports that he drinks alcohol. His drug history is not on file.  Allergies:  Allergies  Allergen Reactions  . Bactrim [Sulfamethoxazole-Trimethoprim]     Prior to Admission medications   Medication Sig Start Date End Date Taking? Authorizing Provider  dicyclomine (BENTYL) 20 MG tablet TAKE 1 TABLET BY MOUTH ONCE DAILY. 12/23/14  Yes Arlis Porta., MD  losartan (COZAAR) 100 MG tablet TAKE 1 TABLET BY MOUTH ONCE DAILY. 01/21/15  Yes Arlis Porta., MD  metFORMIN (GLUCOPHAGE) 500 MG tablet Take 1 tablet (500 mg total) by mouth daily with breakfast. 10/28/14  Yes Arlis Porta., MD  omeprazole (PRILOSEC) 20 MG capsule Take 1 capsule by mouth twice daily 11/26/14  Yes Arlis Porta., MD  promethazine (PHENERGAN) 25 MG tablet Take 25 mg by mouth every 6 (six) hours as needed for nausea or vomiting.   Yes Historical Provider, MD    Results for orders placed or performed during the  hospital encounter of 01/23/15 (from the past 48 hour(s))  Basic metabolic panel     Status: Abnormal   Collection Time: 01/23/15  2:06 AM  Result Value Ref Range   Sodium 140 135 - 145 mmol/L   Potassium 3.8 3.5 - 5.1 mmol/L   Chloride 106 101 - 111 mmol/L   CO2 30 22 - 32 mmol/L   Glucose, Bld 106 (H) 65 - 99 mg/dL   BUN 13 6 - 20 mg/dL   Creatinine, Ser 1.22 0.61 - 1.24 mg/dL   Calcium 8.9 8.9 - 10.3 mg/dL   GFR calc non Af Amer >60 >60 mL/min   GFR calc Af Amer >60 >60 mL/min    Comment: (NOTE) The eGFR has been calculated using the CKD EPI equation. This calculation has not been validated in all clinical situations. eGFR's persistently <60 mL/min signify possible Chronic Kidney Disease.    Anion gap 4 (L) 5 - 15  CBC     Status: Abnormal   Collection Time: 01/23/15  2:06 AM  Result Value Ref Range   WBC 15.6 (H) 3.8 - 10.6 K/uL   RBC 4.16 (L) 4.40 - 5.90 MIL/uL   Hemoglobin 12.1 (L) 13.0 - 18.0 g/dL   HCT 37.2 (L) 40.0 - 52.0 %   MCV 89.3 80.0 - 100.0 fL   MCH 29.2 26.0 - 34.0 pg   MCHC 32.7 32.0 - 36.0  g/dL   RDW 15.3 (H) 11.5 - 14.5 %   Platelets 284 150 - 440 K/uL  Troponin I     Status: None   Collection Time: 01/23/15  2:06 AM  Result Value Ref Range   Troponin I <0.03 <0.031 ng/mL    Comment:        NO INDICATION OF MYOCARDIAL INJURY.   Uric acid     Status: Abnormal   Collection Time: 01/23/15  2:06 AM  Result Value Ref Range   Uric Acid, Serum 9.0 (H) 4.4 - 7.6 mg/dL  Lactic acid, plasma     Status: None   Collection Time: 01/23/15  4:56 AM  Result Value Ref Range   Lactic Acid, Venous 1.6 0.5 - 2.0 mmol/L  Troponin I     Status: None   Collection Time: 01/23/15  5:10 AM  Result Value Ref Range   Troponin I <0.03 <0.031 ng/mL    Comment:        NO INDICATION OF MYOCARDIAL INJURY.    Dg Chest 2 View  01/23/2015  CLINICAL DATA:  Left upper extremity pain radiating into the chest. EXAM: CHEST  2 VIEW COMPARISON:  01/26/2014 FINDINGS: The heart size  and mediastinal contours are within normal limits. Both lungs are clear. The visualized skeletal structures are unremarkable. IMPRESSION: No active cardiopulmonary disease. Electronically Signed   By: Andreas Newport M.D.   On: 01/23/2015 02:20   Dg Foot Complete Left  01/23/2015  CLINICAL DATA:  Pain and swelling the left foot. EXAM: LEFT FOOT - COMPLETE 3+ VIEW COMPARISON:  None. FINDINGS: Negative for acute fracture or dislocation. Moderate midfoot degenerative changes are present involving the intertarsal and tarsometatarsal articulations. No bone lesion or bony destruction is evident. IMPRESSION: Midfoot degenerative changes.  No acute findings. Electronically Signed   By: Andreas Newport M.D.   On: 01/23/2015 05:00    Review of Systems  Constitutional: Negative for fever and chills.  HENT: Negative for sore throat and tinnitus.   Eyes: Negative for blurred vision and redness.  Respiratory: Negative for cough and shortness of breath.   Cardiovascular: Negative for chest pain, palpitations, orthopnea and PND.  Gastrointestinal: Negative for nausea, vomiting, abdominal pain and diarrhea.  Genitourinary: Negative for dysuria, urgency and frequency.  Musculoskeletal: Negative for myalgias and joint pain.  Skin: Negative for rash.       No lesions  Neurological: Negative for speech change, focal weakness and weakness.  Endo/Heme/Allergies: Does not bruise/bleed easily.       No temperature intolerance  Psychiatric/Behavioral: Negative for depression and suicidal ideas.    Blood pressure 159/79, pulse 110, temperature 98.6 F (37 C), temperature source Oral, resp. rate 22, height 6' (1.829 m), weight 127.007 kg (280 lb), SpO2 100 %. Physical Exam  Nursing note and vitals reviewed. Constitutional: He is oriented to person, place, and time. He appears well-developed and well-nourished. No distress.  HENT:  Head: Normocephalic and atraumatic.  Mouth/Throat: Oropharynx is clear and  moist.  Eyes: Conjunctivae and EOM are normal. Pupils are equal, round, and reactive to light. No scleral icterus.  Neck: Normal range of motion. Neck supple. No JVD present. No tracheal deviation present. No thyromegaly present.  Cardiovascular: Regular rhythm and normal heart sounds.  Exam reveals no gallop and no friction rub.   No murmur heard. Respiratory: Effort normal and breath sounds normal.  GI: Soft. Bowel sounds are normal. He exhibits no distension. There is no tenderness.  Musculoskeletal: Normal range of motion.  He exhibits tenderness. He exhibits no edema.  Lymphadenopathy:    He has no cervical adenopathy.  Neurological: He is alert and oriented to person, place, and time. No cranial nerve deficit.  Skin: Skin is warm and dry. No rash noted. There is erythema.  Psychiatric: He has a normal mood and affect. His behavior is normal. Judgment and thought content normal.     Assessment/Plan This is a 39 year old Caucasian male admitted for sepsis and cellulitis of the left foot. 1. Cellulitis: Limited to dorsum and lateral aspect of left foot at this time. Patient reports a history of gout but can't remember the last time had a gout flare or was taking gout medication. He is received vancomycin in the emergency department which we will continue. 2. Sepsis: The patient meets criteria via leukocytosis and tachycardia. He is hemodynamically stable. We will continue to fluid resuscitate. Follow blood cultures for growth and sensitivities. 3. Diabetes mellitus type II: Check hemoglobin A1c. Hold metformin; sliding scale insulin while hospitalized 4. Hypertension: Continue losartan 5. Morbid obesity: BMI is 38.1; encouraged healthy diet and exercise 6. DVT prophylaxis: Lovenox 7. GI prophylaxis: None The patient is a full code. Time spent on admission orders and patient care possibly 35 minutes.  Harrie Foreman 01/23/2015, 7:10 AM

## 2015-01-23 NOTE — ED Notes (Addendum)
Pt to triage via w/c with no distress noted; reports hx gout; having pain & swelling to left foot for last few days as well as left arm pain radiating into chest; denies any other accomp symptoms

## 2015-01-23 NOTE — Progress Notes (Signed)
Pharmacy Note - Enoxaparin  Patient with orders for enoxaparin 30mg  SQ Q12H for VTE prophylaxis  Body mass index is 45.28 kg/(m^2). Estimated Creatinine Clearance: 127.7 mL/min (by C-G formula based on Cr of 1.22).  Changed to enoxaparin 40mg  SQ M19Q per policy for BMI > [place], PharmD Clinical Pharmacist  01/23/2015 12:07 PM

## 2015-01-23 NOTE — Progress Notes (Signed)
[facility] [place] NAME: Alan Eaton    MR#:  [id number]  DATE OF BIRTH:  November 24, 1975  SUBJECTIVE:  CHIEF COMPLAINT:   Chief Complaint  Patient presents with  . Foot Pain  . Chest Pain  Feeling better.  REVIEW OF SYSTEMS:  Review of Systems  Constitutional: Negative for fever, weight loss, malaise/fatigue and diaphoresis.  HENT: Negative for ear discharge, ear pain, hearing loss, nosebleeds, sore throat and tinnitus.   Eyes: Negative for blurred vision and pain.  Respiratory: Positive for cough and shortness of breath. Negative for hemoptysis and wheezing.   Cardiovascular: Negative for chest pain, palpitations, orthopnea and leg swelling.  Gastrointestinal: Negative for heartburn, nausea, vomiting, abdominal pain, diarrhea, constipation and blood in stool.  Genitourinary: Negative for dysuria, urgency and frequency.  Musculoskeletal: Negative for myalgias and back pain.  Skin: Positive for rash. Negative for itching.  Neurological: Negative for dizziness, tingling, tremors, focal weakness, seizures, weakness and headaches.  Psychiatric/Behavioral: Negative for depression. The patient is not nervous/anxious.    DRUG ALLERGIES:   Allergies  Allergen Reactions  . Bee Venom Swelling  . Bactrim [Sulfamethoxazole-Trimethoprim] Swelling and Rash   VITALS:  Blood pressure 153/92, pulse 111, temperature 97.8 F (36.6 C), temperature source Oral, resp. rate 20, height 6\' 2"  (1.88 m), weight 160.029 kg (352 lb 12.8 oz), SpO2 96 %. PHYSICAL EXAMINATION:  Physical Exam  Constitutional: He is oriented to person, place, and time and well-developed, well-nourished, and in no distress.  HENT:  Head: Normocephalic and atraumatic.  Eyes: Conjunctivae and EOM are normal. Pupils are equal, round, and reactive to light.  Neck: Normal range of motion. Neck supple. No tracheal deviation present. No thyromegaly present.  Cardiovascular:  Normal rate, regular rhythm and normal heart sounds.   Pulmonary/Chest: Effort normal and breath sounds normal. No respiratory distress. He has no wheezes. He exhibits no tenderness.  Abdominal: Soft. Bowel sounds are normal. He exhibits no distension. There is no tenderness.  Musculoskeletal: Normal range of motion.  Neurological: He is alert and oriented to person, place, and time. No cranial nerve deficit.  Skin: Skin is warm and dry. Rash noted. There is erythema.     Psychiatric: Mood and affect normal.   LABORATORY PANEL:   CBC  Recent Labs Lab 01/23/15 0206  WBC 15.6*  HGB 12.1*  HCT 37.2*  PLT 284   ------------------------------------------------------------------------------------------------------------------ Chemistries   Recent Labs Lab 01/23/15 0206  NA 140  K 3.8  CL 106  CO2 30  GLUCOSE 106*  BUN 13  CREATININE 1.22  CALCIUM 8.9   RADIOLOGY:  Dg Chest 2 View  01/23/2015  CLINICAL DATA:  Left upper extremity pain radiating into the chest. EXAM: CHEST  2 VIEW COMPARISON:  01/26/2014 FINDINGS: The heart size and mediastinal contours are within normal limits. Both lungs are clear. The visualized skeletal structures are unremarkable. IMPRESSION: No active cardiopulmonary disease. Electronically Signed   By: Andreas Newport M.D.   On: 01/23/2015 02:20   Dg Foot Complete Left  01/23/2015  CLINICAL DATA:  Pain and swelling the left foot. EXAM: LEFT FOOT - COMPLETE 3+ VIEW COMPARISON:  None. FINDINGS: Negative for acute fracture or dislocation. Moderate midfoot degenerative changes are present involving the intertarsal and tarsometatarsal articulations. No bone lesion or bony destruction is evident. IMPRESSION: Midfoot degenerative changes.  No acute findings. Electronically Signed   By: Andreas Newport M.D.   On: 01/23/2015 05:00   ASSESSMENT AND PLAN:  This is a 39 year old Caucasian male admitted for sepsis and cellulitis of the left foot.  1. Cellulitis:  Limited to dorsum and lateral aspect of left foot at this time. Patient reports a history of gout but can't remember the last time had a gout flare or was taking gout medication. Continue vancomycin, may have gout?   2. Sepsis: present on admission. continue to fluid resuscitate. Follow blood cultures for growth and sensitivities.  3. Diabetes mellitus type II:  hemoglobin A1c 6.4. Hold metformin; sliding scale insulin while hospitalized  4. Hypertension: Continue losartan  5. Morbid obesity: BMI is 38.1; encouraged healthy diet and exercise  6. DVT prophylaxis: Lovenox    All the records are reviewed and case discussed with Care Management/Social Worker. Management plans discussed with the patient, family and they are in agreement.  CODE STATUS: Full Code  TOTAL TIME TAKING CARE OF THIS PATIENT: 35 minutes.   More than 50% of the time was spent in counseling/coordination of care: YES  POSSIBLE D/C IN AM, DEPENDING ON CLINICAL CONDITION.   [facility], Sharee Sturdy M.D on 01/23/2015 at 5:24 PM  Between 7am to 6pm - Pager - [phone]  After 6pm go to [email or web address] - password [email or web address] [email or web address]  Office  [phone]  CC:  Primary care physician; Dicky Doe, MD

## 2015-01-23 NOTE — ED Notes (Signed)
Patient transported to X-ray 

## 2015-01-23 NOTE — ED Notes (Signed)
Pt reports left foot pain for 3 days ,history of gout, reports pain does not feel like in the past, pt reports upon arrival to ER he was having left arm pain, denies any chest pain at present, pt denies any shortness of breath. Pt talks in complete sentences no respiratory distress noted. Pt on cardiac monitor tachycardic, denies any symptoms MD aware.

## 2015-01-23 NOTE — ED Provider Notes (Signed)
[facility] Emergency Department Provider Note REMINDER - THIS NOTE IS NOT A FINAL MEDICAL RECORD UNTIL IT IS SIGNED. UNTIL THEN, THE CONTENT BELOW MAY REFLECT INFORMATION FROM A DOCUMENTATION TEMPLATE, NOT THE ACTUAL PATIENT VISIT. ____________________________________________  Time seen: Approximately 6:02 AM  I have reviewed the triage vital signs and the nursing notes.   HISTORY  Chief Complaint Foot Pain and Chest Pain    HPI Alan Eaton is a 39 y.o. male previous history of hypertension and gout.He presents today with a concern of increasing pain in his left foot for the last 3 days. He states that he has had gout in his foot, but this seems different in the skin and the foot seems red and he wonders if he could have infection. He states that he has not had a falls or injury, but he does notice that he's been having pain across the foot with some significant discomfort with walking. Denies numbness or tingling in the foot. No cold or blue foot.  Patient also reports that a brief episode lasted less than a couple of hours earlier today of pain in his left arm that was shooting in nature, but this is resolved. He denies that he had "chest pain" and has not a shortness of breath. His primary concern at this point is that he is either having gout flare; on his foot.  Patient is notably diabetic. Denies any tick bites or known injury or insect bite.  Past Medical History  Diagnosis Date  . Gout   . GERD (gastroesophageal reflux disease)   . Hypertension     There are no active problems to display for this patient.   History reviewed. No pertinent past surgical history.  Current Outpatient Rx  Name  Route  Sig  Dispense  Refill  . dicyclomine (BENTYL) 20 MG tablet      TAKE 1 TABLET BY MOUTH ONCE DAILY.   30 tablet   6   . losartan (COZAAR) 100 MG tablet      TAKE 1 TABLET BY MOUTH ONCE DAILY.   90 tablet   1   . metFORMIN (GLUCOPHAGE) 500  MG tablet   Oral   Take 1 tablet (500 mg total) by mouth daily with breakfast.   90 tablet   3   . omeprazole (PRILOSEC) 20 MG capsule      Take 1 capsule by mouth twice daily   60 capsule   12   . promethazine (PHENERGAN) 25 MG tablet   Oral   Take 25 mg by mouth every 6 (six) hours as needed for nausea or vomiting.           Allergies Bactrim  No family history on file.  Social History Social History  Substance Use Topics  . Smoking status: Current Every Day Smoker -- 0.50 packs/day    Types: Cigarettes  . Smokeless tobacco: None  . Alcohol Use: Yes     Comment: occasional    Review of Systems Constitutional: No fever/chills Eyes: No visual changes. ENT: No sore throat. Cardiovascular: Denies chest pain. Please see history of present illness Respiratory: Denies shortness of breath. Gastrointestinal: No abdominal pain.  No nausea, no vomiting.  No diarrhea.  No constipation. Genitourinary: Negative for dysuria. Musculoskeletal: Negative for back pain. Skin: Negative for rash. Neurological: Negative for headaches, focal weakness or numbness.  10-point ROS otherwise negative.  ____________________________________________   PHYSICAL EXAM:  VITAL SIGNS: ED Triage Vitals  Enc Vitals Group  BP 01/23/15 0153 160/100 mmHg     Pulse Rate 01/23/15 0153 127     Resp 01/23/15 0153 24     Temp 01/23/15 0153 97.9 F (36.6 C)     Temp Source 01/23/15 0153 Oral     SpO2 01/23/15 0153 95 %     Weight 01/23/15 0153 280 lb (127.007 kg)     Height 01/23/15 0153 6' (1.829 m)     Head Cir --      Peak Flow --      Pain Score 01/23/15 0151 10     Pain Loc --      Pain Edu? --      Excl. in [place]? --    Constitutional: Alert and oriented. Well appearing and in no acute distress. Eyes: Conjunctivae are normal. PERRL. EOMI. Head: Atraumatic. Nose: No congestion/rhinnorhea. Mouth/Throat: Mucous membranes are moist.  Oropharynx non-erythematous. Neck: No stridor.    Cardiovascular: Normal rate, regular rhythm. Grossly normal heart sounds.  Good peripheral circulation. Respiratory: Normal respiratory effort.  No retractions. Lungs CTAB. Gastrointestinal: Soft and nontender. No distention. No abdominal bruits. No CVA tenderness. Musculoskeletal: No lower extremity tenderness nor edema except along the dorsal surface and the lateral aspect of the left foot and ankle. Patient has erythema with mild 1+ edema involving the left foot. Dorsalis pedis pulses and capillary refill normal bilateral. Calf, knee normal. There is no notable joint effusion at the left knee. No joint effusions. Neurologic:  Normal speech and language. No gross focal neurologic deficits are appreciated. Skin:  Skin is warm, dry and intact. No rash noted. Psychiatric: Mood and affect are normal. Speech and behavior are normal.  ____________________________________________   LABS (all labs ordered are listed, but only abnormal results are displayed)  Labs Reviewed  BASIC METABOLIC PANEL - Abnormal; Notable for the following:    Glucose, Bld 106 (*)    Anion gap 4 (*)    All other components within normal limits  CBC - Abnormal; Notable for the following:    WBC 15.6 (*)    RBC 4.16 (*)    Hemoglobin 12.1 (*)    HCT 37.2 (*)    RDW 15.3 (*)    All other components within normal limits  URIC ACID - Abnormal; Notable for the following:    Uric Acid, Serum 9.0 (*)    All other components within normal limits  CULTURE, BLOOD (ROUTINE X 2)  CULTURE, BLOOD (ROUTINE X 2)  TROPONIN I  LACTIC ACID, PLASMA  LACTIC ACID, PLASMA  TROPONIN I   ____________________________________________  EKG  Reviewed and interpreted by me EKG time 2 AM Sinus tachycardia Ventricular rate 140 QRS 65 QTc 410 Sinus tachycardia, no acute ischemic T-wave changes are noted ____________________________________________  RADIOLOGY  DG Foot Complete Left (Final result) Result time: 01/23/15 05:00:47    Final result by Rad Results In Interface (01/23/15 05:00:47)   Narrative:   CLINICAL DATA: Pain and swelling the left foot.  EXAM: LEFT FOOT - COMPLETE 3+ VIEW  COMPARISON: None.  FINDINGS: Negative for acute fracture or dislocation. Moderate midfoot degenerative changes are present involving the intertarsal and tarsometatarsal articulations. No bone lesion or bony destruction is evident.  IMPRESSION: Midfoot degenerative changes. No acute findings.   Electronically Signed By: Andreas Newport M.D. On: 01/23/2015 05:00          DG Chest 2 View (Final result) Result time: 01/23/15 02:20:45   Final result by Rad Results In Interface (01/23/15 02:20:45)  Narrative:   CLINICAL DATA: Left upper extremity pain radiating into the chest.  EXAM: CHEST 2 VIEW  COMPARISON: 01/26/2014  FINDINGS: The heart size and mediastinal contours are within normal limits. Both lungs are clear. The visualized skeletal structures are unremarkable.  IMPRESSION: No active cardiopulmonary disease.     ____________________________________________   PROCEDURES  Procedure(s) performed: None  Critical Care performed: No  ____________________________________________   INITIAL IMPRESSION / ASSESSMENT AND PLAN / ED COURSE  Pertinent labs & imaging results that were available during my care of the patient were reviewed by me and considered in my medical decision making (see chart for details).  No evidence of neurovascular compromise involving the left foot, but concern for infection/cellulitis.  Patient presents with increasing pain over the left foot for 3 days and also a short episode of sharp pain in the left arm, which now seems to be resolved with no elevation of initial troponin and nonischemic appearing 12-lead. Do not believe this represents acute coronary syndrome. My primary concern now is that the patient does have what appears to be cellulitis involving the  left lateral aspect of the foot with associated tachycardia and leukocytosis. He does not have a notable fever, but he is noted to be a diabetic. Based on his leukocytosis, tachycardia which remains after 1 L fluid admit to the hospital though blood cultures have been performed for rule out sepsis and treatment with antibiotics and further evaluation via the hospitalist service. Patient agreeable with the plan. Pain is much better after morphine. ____________________________________________   FINAL CLINICAL IMPRESSION(S) / ED DIAGNOSES  Final diagnoses:  Left arm pain  Cellulitis of left lower extremity  Sepsis, due to unspecified organism [facility])      Delman Kitten, MD 01/23/15 [phone]

## 2015-01-23 NOTE — ED Notes (Signed)
Called pharmacy for Vancomycin

## 2015-01-23 NOTE — Progress Notes (Signed)
ANTIBIOTIC CONSULT NOTE - INITIAL  Pharmacy Consult for vancomycin Indication: cellulitis  Allergies  Allergen Reactions  . Bactrim [Sulfamethoxazole-Trimethoprim]   . Bee Venom Swelling    Patient Measurements: Height: 6\' 2"  (188 cm) Weight: (!) 352 lb 12.8 oz (160.029 kg) IBW/kg (Calculated) : 82.2 Adjusted Body Weight: 113  Vital Signs: Temp: 97.8 F (36.6 C) (11/03 0952) Temp Source: Oral (11/03 0952) BP: 153/92 mmHg (11/03 0952) Pulse Rate: 111 (11/03 0952)  Labs:  Recent Labs  01/23/15 0206  WBC 15.6*  HGB 12.1*  PLT 284  CREATININE 1.22   Estimated Creatinine Clearance: 127.7 mL/min (by C-G formula based on Cr of 1.22). No results for input(s): VANCOTROUGH, VANCOPEAK, VANCORANDOM, GENTTROUGH, GENTPEAK, GENTRANDOM, TOBRATROUGH, TOBRAPEAK, TOBRARND, AMIKACINPEAK, AMIKACINTROU, AMIKACIN in the last 72 hours.   Microbiology: Recent Results (from the past 720 hour(s))  Culture, blood (routine x 2)     Status: None (Preliminary result)   Collection Time: 01/23/15  4:56 AM  Result Value Ref Range Status   Specimen Description BLOOD LEFT HAND  Final   Special Requests BOTTLES DRAWN AEROBIC AND ANAEROBIC 5ML  Final   Culture NO GROWTH < 12 HOURS  Final   Report Status PENDING  Incomplete  Culture, blood (routine x 2)     Status: None (Preliminary result)   Collection Time: 01/23/15  5:08 AM  Result Value Ref Range Status   Specimen Description BLOOD RIGHT HAND  Final   Special Requests BOTTLES DRAWN AEROBIC AND ANAEROBIC 5ML  Final   Culture NO GROWTH < 12 HOURS  Final   Report Status PENDING  Incomplete   Assessment: Pharmacy consulted to dose vancomycin for sepsis/cellulitis in this 39 year old male  PK parameters: SCr: 1.22, CrCl (via normalized CG): 32ml/min Ke: 0.071, t1/2: 9.7h, Vd: 79L (with AdjBW)  Goal of Therapy:  Vancomycin trough level 15-20 mcg/ml  Plan:  Patient received vancomycin 1500mg  IV x 1 this AM in ER. Will follow with vancomycin  1500mg  IV Q12H to start at 12:30 for stacked dosing. Will check trough prior to fourth dose, which should be approaching steady state.  Patient is at high risk for accumulation of drug due to obesity. Will follow closely. Careful dose adjustment and monitoring of renal function.  Pharmacy to follow per consult  Rexene Edison, PharmD Clinical Pharmacist  01/23/2015 11:50 AM

## 2015-01-24 LAB — GLUCOSE, CAPILLARY
Glucose-Capillary: 93 mg/dL (ref 65–99)
Glucose-Capillary: 114 mg/dL — ABNORMAL HIGH (ref 65–99)

## 2015-01-24 LAB — CBC
HCT: 35 % — ABNORMAL LOW (ref 40.0–52.0)
Hemoglobin: 11.2 g/dL — ABNORMAL LOW (ref 13.0–18.0)
MCH: 28.6 pg (ref 26.0–34.0)
MCHC: 32 g/dL (ref 32.0–36.0)
MCV: 89.4 fL (ref 80.0–100.0)
PLATELETS: 266 10*3/uL (ref 150–440)
RBC: 3.92 MIL/uL — AB (ref 4.40–5.90)
RDW: 15.7 % — AB (ref 11.5–14.5)
WBC: 13.7 10*3/uL — AB (ref 3.8–10.6)

## 2015-01-24 LAB — CREATININE, SERUM
Creatinine, Ser: 1.02 mg/dL (ref 0.61–1.24)
GFR calc Af Amer: >60 mL/min (ref >60)
GFR calc non Af Amer: >60 mL/min (ref >60)

## 2015-01-24 MED ORDER — CLINDAMYCIN HCL 300 MG PO CAPS: 300.0000 mg | ORAL_CAPSULE | Freq: Three times a day (TID) | ORAL | Status: DC

## 2015-01-24 MED ORDER — METOPROLOL SUCCINATE ER 100 MG PO TB24: 100.0000 mg | ORAL_TABLET | Freq: Every day | ORAL | Status: DC
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
## 2015-01-24 NOTE — Care Management Important Message (Signed)
Important Message  Patient Details  Name: Alan Eaton MRN: [id number] Date of Birth: 1975/05/28   Medicare Important Message Given:  Yes-second notification given    Shelbie Ammons, RN 01/24/2015, 12:20 PM

## 2015-01-24 NOTE — Plan of Care (Signed)
Problem: Education: Goal: Knowledge of [facility] Education information/materials will improve Outcome: Progressing Education provided on s/s of sepsis/infection.  Handout given to patient on Vancomycin.  Problem: Pain Managment: Goal: General experience of comfort will improve Outcome: Progressing Patient monitored for pain.  No complaints of pain this shift.    Problem: Physical Regulation: Goal: Ability to maintain clinical measurements within normal limits will improve Outcome: Progressing Patient with cellulitis to left ankle/calf area.  Black area marked around cellulitis areas.  Vancomycin given this shift per eMAR.  Redness and areas are improving and diminishing.  Problem: Activity: Goal: Risk for activity intolerance will decrease Outcome: Progressing Patient is a moderate fall risk.  He is up to the bedside commode independently this shift.  Tolerating well.

## 2015-01-24 NOTE — Progress Notes (Signed)
MD making rounds. Discharge orders received. Appointment Scheduled. Instructed to schedule appointment with Dr. Jefm Bryant due to office being closed. IV removed. Prescriptions E-Prescribed to Pharmacy. Discharge paperwork provided, explained, signed and witnessed. Education handouts provided to patient. No unanswered questions. Escorted via wheelchair by [employer] representative. All belongings sent with patient and family.

## 2015-01-24 NOTE — Discharge Instructions (Signed)
Cellulitis Cellulitis is an infection of the skin and the tissue under the skin. The infected area is usually red and tender. This happens most often in the arms and lower legs. HOME CARE   Take your antibiotic medicine as told. Finish the medicine even if you start to feel better.  Keep the infected arm or leg raised (elevated).  Put a warm cloth on the area up to 4 times per day.  Only take medicines as told by your doctor.  Keep all doctor visits as told. GET HELP IF:  You see red streaks on the skin coming from the infected area.  Your red area gets bigger or turns a dark color.  Your bone or joint under the infected area is painful after the skin heals.  Your infection comes back in the same area or different area.  You have a puffy (swollen) bump in the infected area.  You have new symptoms.  You have a fever. GET HELP RIGHT AWAY IF:   You feel very sleepy.  You throw up (vomit) or have watery poop (diarrhea).  You feel sick and have muscle aches and pains.   This information is not intended to replace advice given to you by your health care provider. Make sure you discuss any questions you have with your health care provider.   Document Released: 08/25/2007 Document Revised: 11/27/2014 Document Reviewed: 05/24/2011 Elsevier Interactive Patient Education 2016 [place].  Gout Gout is when your joints become red, sore, and swell (inflamed). This is caused by the buildup of uric acid crystals in the joints. Uric acid is a chemical that is normally in the blood. If the level of uric acid gets too high in the blood, these crystals form in your joints and tissues. Over time, these crystals can form into masses near the joints and tissues. These masses can destroy bone and cause the bone to look misshapen (deformed). HOME CARE   Do not take aspirin for pain.  Only take medicine as told by your doctor.  Rest the joint as much as you can. When in bed, keep sheets  and blankets off painful areas.  Keep the sore joints raised (elevated).  Put warm or cold packs on painful joints. Use of warm or cold packs depends on which works best for you.  Use crutches if the painful joint is in your leg.  Drink enough fluids to keep your pee (urine) clear or pale yellow. Limit alcohol, sugary drinks, and drinks with fructose in them.  Follow your diet instructions. Pay careful attention to how much protein you eat. Include fruits, vegetables, whole grains, and fat-free or low-fat milk products in your daily diet. Talk to your doctor or dietitian about the use of coffee, vitamin C, and cherries. These may help lower uric acid levels.  Keep a healthy body weight. GET HELP RIGHT AWAY IF:   You have watery poop (diarrhea), throw up (vomit), or have any side effects from medicines.  You do not feel better in 24 hours, or you are getting worse.  Your joint becomes suddenly more tender, and you have chills or a fever. MAKE SURE YOU:   Understand these instructions.  Will watch your condition.  Will get help right away if you are not doing well or get worse.   This information is not intended to replace advice given to you by your health care provider. Make sure you discuss any questions you have with your health care provider.   Document  Released: 12/16/2007 Document Revised: 03/29/2014 Document Reviewed: 10/20/2011 Elsevier Interactive Patient Education [employer].

## 2015-01-24 NOTE — Care Management (Signed)
Admitted to this facility with the diagnosis of sepsis (left ankle cellulitis). Lives with girlfriend, [name] [phone]). Last seen Dr. Luan Pulling Tuesday before last. Gets disability for mental problems. No home health. No skilled facility. Uses a cane to aid in ambulation. Got a rolling walker per [place] [facility] during this admission. No falls., Good appetite. Takes care of both basic and instrumental activities of daily living himself, still drives.  "Mother or someone will transport." Discharge today per Dr. Manuella Ghazi, Shelbie Ammons RN MSN CCM Care Management [phone]

## 2015-01-27 MED ORDER — ALLOPURINOL 100 MG PO TABS: 100.0000 mg | ORAL_TABLET | Freq: Every day | ORAL | Status: DC

## 2015-01-27 MED ORDER — COLCHICINE 0.6 MG PO TABS: 0.6000 mg | ORAL_TABLET | Freq: Every day | ORAL | Status: DC

## 2015-01-27 MED ORDER — COLCHICINE 0.6 MG PO TABS: 0.6000 mg | ORAL_TABLET | Freq: Two times a day (BID) | ORAL | Status: DC

## 2015-01-27 NOTE — Discharge Summary (Signed)
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 Gout   . GERD (gastroesophageal reflux disease)   . Hypertension   . Diabetes mellitus without complication [facility])    HOSPITAL COURSE:  39 year old Caucasian male admitted for sepsis and cellulitis of the left foot. Please see Dr Tinnie Gens dictated [facility] & [facility] for further details. He was slowly improving with IV Abx. 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 He recommended Starting Colcrys 0.6 mg twice a day and allopurinol 100 mg by mouth daily after flair has improved. Patient was in agreement with same.  He was feeling much better and was D/C home in stable condition. DISCHARGE CONDITIONS:  stable CONSULTS OBTAINED:  Treatment Team:  Emmaline Kluver., MD DRUG ALLERGIES:   Allergies  Allergen Reactions  . Bee Venom Swelling  . Bactrim [Sulfamethoxazole-Trimethoprim] Swelling and Rash   DISCHARGE MEDICATIONS:   Discharge Medication List as of 01/24/2015  2:29 PM    START taking these medications   Details  clindamycin (CLEOCIN) 300 MG capsule Take 1 capsule (300 mg total) by mouth 3 (three) times daily., Starting 01/24/2015, Until Discontinued, Normal      CONTINUE  these medications which have CHANGED   Details  metoprolol succinate (TOPROL XL) 100 MG 24 hr tablet Take 1 tablet (100 mg total) by mouth daily. Take with or immediately following a meal., Starting 01/24/2015, Until Discontinued, Normal      CONTINUE these medications which have NOT CHANGED   Details  aspirin 81 MG tablet Take 81 mg by mouth daily., Until Discontinued, Historical Med    B Complex-C (B-COMPLEX WITH VITAMIN C) tablet Take 1 tablet by mouth daily., Until Discontinued, Historical Med    dicyclomine (BENTYL) 20 MG tablet TAKE 1 TABLET BY MOUTH ONCE DAILY., Normal    famotidine (PEPCID) 40 MG tablet Take 40 mg by mouth at bedtime., Until Discontinued, Historical Med    losartan (COZAAR) 100 MG tablet TAKE 1 TABLET BY MOUTH ONCE DAILY., Normal    metFORMIN (GLUCOPHAGE) 500 MG tablet Take 1 tablet (500 mg total) by mouth daily with breakfast., Starting 10/28/2014, Until Discontinued, Normal    Multiple Vitamins-Minerals (MULTIVITAMIN WITH MINERALS) tablet Take 1 tablet by mouth daily., Until Discontinued, Historical Med    omeprazole (PRILOSEC) 20 MG capsule Take 1 capsule by mouth twice daily, Normal    promethazine (PHENERGAN) 25 MG tablet Take 25 mg by mouth 2 (two) times daily. , Until Discontinued, Historical Med       DISCHARGE INSTRUCTIONS:   DIET:  Regular diet DISCHARGE CONDITION:  Good ACTIVITY:  Activity as tolerated OXYGEN:  Home Oxygen: No.  Oxygen Delivery: room air DISCHARGE LOCATION:  home   If you experience worsening of your admission symptoms, develop shortness of  breath, life threatening emergency, suicidal or homicidal thoughts you must seek medical attention immediately by calling 911 or calling your MD immediately  if symptoms less severe.  You Must read complete instructions/literature along with all the possible adverse reactions/side effects for all the Medicines you take and that have been prescribed to you. Take any new Medicines after you  have completely understood and accpet all the possible adverse reactions/side effects.   Please note  You were cared for by a hospitalist during your hospital stay. If you have any questions about your discharge medications or the care you received while you were in the hospital after you are discharged, you can call the unit and asked to speak with the hospitalist on call if the hospitalist that took care of you is not available. Once you are discharged, your primary care physician will handle any further medical issues. Please note that NO REFILLS for any discharge medications will be authorized once you are discharged, as it is imperative that you return to your primary care physician (or establish a relationship with a primary care physician if you do not have one) for your aftercare needs so that they can reassess your need for medications and monitor your lab values.    On the day of Discharge: VITAL SIGNS:  Blood pressure 160/94, pulse 99, temperature 98.2 F (36.8 C), temperature source Oral, resp. rate 20, height 6\' 2"  (1.88 m), weight 160.483 kg (353 lb 12.8 oz), SpO2 100 %. PHYSICAL EXAMINATION:  GENERAL:  39 y.o.-year-old patient lying in the bed with no acute distress.  EYES: Pupils equal, round, reactive to light and accommodation. No scleral icterus. Extraocular muscles intact.  HEENT: Head atraumatic, normocephalic. Oropharynx and nasopharynx clear.  NECK:  Supple, no jugular venous distention. No thyroid enlargement, no tenderness.  LUNGS: Normal breath sounds bilaterally, no wheezing, rales,rhonchi or crepitation. No use of accessory muscles of respiration.  CARDIOVASCULAR: S1, S2 normal. No murmurs, rubs, or gallops.  ABDOMEN: Soft, non-tender, non-distended. Bowel sounds present. No organomegaly or mass.  EXTREMITIES: No pedal edema, cyanosis, or clubbing.  NEUROLOGIC: Cranial nerves II through XII are intact. Muscle strength 5/5 in all extremities. Sensation intact. Gait not  checked.  PSYCHIATRIC: The patient is alert and oriented x 3.  SKIN: No obvious rash, lesion, or ulcer.  DATA REVIEW:   CBC  Recent Labs Lab 01/24/15 0422  WBC 13.7*  HGB 11.2*  HCT 35.0*  PLT 266    Chemistries   Recent Labs Lab 01/23/15 0206 01/24/15 0422  NA 140  --   K 3.8  --   CL 106  --   CO2 30  --   GLUCOSE 106*  --   BUN 13  --   CREATININE 1.22 1.02  CALCIUM 8.9  --     Management plans discussed with the patient, family and they are in agreement.  CODE STATUS: Full Code  TOTAL TIME TAKING CARE OF THIS PATIENT: 55 minutes.    [facility], Haylin Camilli M.D on 01/27/2015 at 10:59 AM  Between 7am to 6pm - Pager - [phone]  After 6pm go to [email or web address] - password [email or web address] [facility]  Office  [phone]  CC: Primary care physician; Dicky Doe, MD Liliane Bade MD

## 2015-01-28 LAB — CULTURE, BLOOD (ROUTINE X 2)
CULTURE: NO GROWTH
Culture: NO GROWTH

## 2015-01-29 ENCOUNTER — Inpatient Hospital Stay: Payer: Self-pay | Admitting: Family Medicine

## 2015-01-30 ENCOUNTER — Telehealth: Payer: Self-pay | Admitting: Family Medicine

## 2015-01-30 NOTE — Telephone Encounter (Signed)
On 10/28/14 #90 of Metformin was sent to local pharmacy. Patient only takes 1 tablet daily. There were 3 additional refills left. Patient should not be out of medication. Patient has hfu scheduled on 02/03/15.

## 2015-01-30 NOTE — Telephone Encounter (Signed)
Kim at [facility] said a refill request was faxed over for metformin.  Her call back number is [phone] ext [phone]

## 2015-02-03 ENCOUNTER — Ambulatory Visit (INDEPENDENT_AMBULATORY_CARE_PROVIDER_SITE_OTHER): Payer: Medicare Other | Admitting: Family Medicine

## 2015-02-03 ENCOUNTER — Encounter: Payer: Self-pay | Admitting: Family Medicine

## 2015-02-03 VITALS — BP 126/87 | HR 103 | Temp 98.4°F | Resp 16 | Ht 74.0 in | Wt 344.0 lb

## 2015-02-03 DIAGNOSIS — Z23 Encounter for immunization: Secondary | ICD-10-CM | POA: Diagnosis not present

## 2015-02-03 DIAGNOSIS — L03116 Cellulitis of left lower limb: Secondary | ICD-10-CM | POA: Diagnosis not present

## 2015-02-03 NOTE — Progress Notes (Signed)
Name: Alan Eaton   MRN: [id number]    DOB: January 07, 1976   Date:02/03/2015       Progress Note  Subjective  Chief Complaint  Chief Complaint  Patient presents with  . Recurrent Skin Infections    HPI Here for f/u of hospitalization for cellulitis of L foot.  Hosp[  For 2 days.   Discharged on 01/29/15/(?).  [facility]ew has finished antibiotic (Clindamycin).  Has appt. With Dr. Lindon Romp to f/u L foot and R knee.  Feeling well.  BSs at home running  120-125.  No problem-specific assessment & plan notes found for this encounter.   Past Medical History  Diagnosis Date  . Gout   . GERD (gastroesophageal reflux disease)   . Hypertension   . Diabetes mellitus without complication [facility])     Social History  Substance Use Topics  . Smoking status: Current Every Day Smoker -- 0.50 packs/day    Types: Cigarettes  . Smokeless tobacco: Not on file  . Alcohol Use: Yes     Comment: occasional     Current outpatient prescriptions:  .  aspirin 81 MG tablet, Take 81 mg by mouth daily., Disp: , Rfl:  .  B Complex-C (B-COMPLEX WITH VITAMIN C) tablet, Take 1 tablet by mouth daily., Disp: , Rfl:  .  dicyclomine (BENTYL) 20 MG tablet, TAKE 1 TABLET BY MOUTH ONCE DAILY., Disp: 30 tablet, Rfl: 6 .  famotidine (PEPCID) 40 MG tablet, Take 40 mg by mouth at bedtime., Disp: , Rfl:  .  losartan (COZAAR) 100 MG tablet, TAKE 1 TABLET BY MOUTH ONCE DAILY., Disp: 90 tablet, Rfl: 1 .  metFORMIN (GLUCOPHAGE) 500 MG tablet, Take 1 tablet (500 mg total) by mouth daily with breakfast., Disp: 90 tablet, Rfl: 3 .  metoprolol succinate (TOPROL XL) 100 MG 24 hr tablet, Take 1 tablet (100 mg total) by mouth daily. Take with or immediately following a meal., Disp: 30 tablet, Rfl: 0 .  Multiple Vitamins-Minerals (MULTIVITAMIN WITH MINERALS) tablet, Take 1 tablet by mouth daily., Disp: , Rfl:  .  omeprazole (PRILOSEC) 20 MG capsule, Take 1 capsule by mouth twice daily, Disp: 60 capsule, Rfl: 12 .  promethazine (PHENERGAN)  25 MG tablet, Take 25 mg by mouth 2 (two) times daily. , Disp: , Rfl:   Allergies  Allergen Reactions  . Bee Venom Swelling  . Bactrim [Sulfamethoxazole-Trimethoprim] Swelling and Rash    Review of Systems  Constitutional: Positive for weight loss (desired). Negative for fever, chills and malaise/fatigue.  HENT: Negative for hearing loss.   Eyes: Negative for blurred vision and double vision.  Respiratory: Negative for cough, sputum production, shortness of breath and wheezing.   Cardiovascular: Negative for chest pain, palpitations and leg swelling.  Gastrointestinal: Negative for heartburn, abdominal pain and blood in stool.  Genitourinary: Negative for dysuria, urgency and frequency.  Skin: Negative for rash.       Foot cellulitis healing  Neurological: Negative for weakness and headaches.      Objective  Filed Vitals:   02/03/15 1526  BP: 126/87  Pulse: 103  Temp: 98.4 F (36.9 C)  Resp: 16  Height: 6' 2"  (1.88 m)  Weight: 344 lb (156.037 kg)     Physical Exam  Constitutional: He is well-developed, well-nourished, and in no distress. No distress.  HENT:  Head: Normocephalic and atraumatic.  Cardiovascular: Normal rate, regular rhythm and normal heart sounds.  Exam reveals no gallop and no friction rub.   No murmur heard. Pulmonary/Chest: Effort normal  and breath sounds normal. No respiratory distress. He has no wheezes. He has no rales.  Abdominal: He exhibits no mass. There is no tenderness. There is no rebound and no guarding.  Obese.  Stomach muscular cramp that came with sitting up from exam table and finally slowly resolved.  Musculoskeletal: He exhibits no edema.  Skin:  Cellulitis of L foot has totally resolved.  No heat, swelling, tenderness, redness.      Recent Results (from the past 2160 hour(s))  Basic metabolic panel     Status: Abnormal   Collection Time: 01/23/15  2:06 AM  Result Value Ref Range   Sodium 140 135 - 145 mmol/L   Potassium 3.8  3.5 - 5.1 mmol/L   Chloride 106 101 - 111 mmol/L   CO2 30 22 - 32 mmol/L   Glucose, Bld 106 (H) 65 - 99 mg/dL   BUN 13 6 - 20 mg/dL   Creatinine, Ser 1.22 0.61 - 1.24 mg/dL   Calcium 8.9 8.9 - 10.3 mg/dL   GFR calc non Af Amer >60 >60 mL/min   GFR calc Af Amer >60 >60 mL/min    Comment: (NOTE) The eGFR has been calculated using the CKD EPI equation. This calculation has not been validated in all clinical situations. eGFR's persistently <60 mL/min signify possible Chronic Kidney Disease.    Anion gap 4 (L) 5 - 15  CBC     Status: Abnormal   Collection Time: 01/23/15  2:06 AM  Result Value Ref Range   WBC 15.6 (H) 3.8 - 10.6 K/uL   RBC 4.16 (L) 4.40 - 5.90 MIL/uL   Hemoglobin 12.1 (L) 13.0 - 18.0 g/dL   HCT 37.2 (L) 40.0 - 52.0 %   MCV 89.3 80.0 - 100.0 fL   MCH 29.2 26.0 - 34.0 pg   MCHC 32.7 32.0 - 36.0 g/dL   RDW 15.3 (H) 11.5 - 14.5 %   Platelets 284 150 - 440 K/uL  Troponin I     Status: None   Collection Time: 01/23/15  2:06 AM  Result Value Ref Range   Troponin I <0.03 <0.031 ng/mL    Comment:        NO INDICATION OF MYOCARDIAL INJURY.   Uric acid     Status: Abnormal   Collection Time: 01/23/15  2:06 AM  Result Value Ref Range   Uric Acid, Serum 9.0 (H) 4.4 - 7.6 mg/dL  TSH     Status: None   Collection Time: 01/23/15  2:06 AM  Result Value Ref Range   TSH 2.132 0.350 - 4.500 uIU/mL  Hemoglobin A1c     Status: Abnormal   Collection Time: 01/23/15  2:06 AM  Result Value Ref Range   Hgb A1c MFr Bld 6.4 (H) 4.0 - 6.0 %  Culture, blood (routine x 2)     Status: None   Collection Time: 01/23/15  4:56 AM  Result Value Ref Range   Specimen Description BLOOD LEFT HAND    Special Requests BOTTLES DRAWN AEROBIC AND ANAEROBIC 5ML    Culture NO GROWTH 5 DAYS    Report Status 01/28/2015 FINAL   Lactic acid, plasma     Status: None   Collection Time: 01/23/15  4:56 AM  Result Value Ref Range   Lactic Acid, Venous 1.6 0.5 - 2.0 mmol/L  Culture, blood (routine x 2)      Status: None   Collection Time: 01/23/15  5:08 AM  Result Value Ref Range   Specimen Description  BLOOD RIGHT HAND    Special Requests BOTTLES DRAWN AEROBIC AND ANAEROBIC 5ML    Culture NO GROWTH 5 DAYS    Report Status 01/28/2015 FINAL   Troponin I     Status: None   Collection Time: 01/23/15  5:10 AM  Result Value Ref Range   Troponin I <0.03 <0.031 ng/mL    Comment:        NO INDICATION OF MYOCARDIAL INJURY.   Lactic acid, plasma     Status: None   Collection Time: 01/23/15 10:22 AM  Result Value Ref Range   Lactic Acid, Venous 0.7 0.5 - 2.0 mmol/L  Glucose, capillary     Status: Abnormal   Collection Time: 01/23/15 10:33 AM  Result Value Ref Range   Glucose-Capillary 110 (H) 65 - 99 mg/dL  Glucose, capillary     Status: Abnormal   Collection Time: 01/23/15  5:08 PM  Result Value Ref Range   Glucose-Capillary 150 (H) 65 - 99 mg/dL  Glucose, capillary     Status: None   Collection Time: 01/23/15  9:37 PM  Result Value Ref Range   Glucose-Capillary 97 65 - 99 mg/dL  Creatinine, serum     Status: None   Collection Time: 01/24/15  4:22 AM  Result Value Ref Range   Creatinine, Ser 1.02 0.61 - 1.24 mg/dL   GFR calc non Af Amer >60 >60 mL/min   GFR calc Af Amer >60 >60 mL/min    Comment: (NOTE) The eGFR has been calculated using the CKD EPI equation. This calculation has not been validated in all clinical situations. eGFR's persistently <60 mL/min signify possible Chronic Kidney Disease.   CBC     Status: Abnormal   Collection Time: 01/24/15  4:22 AM  Result Value Ref Range   WBC 13.7 (H) 3.8 - 10.6 K/uL   RBC 3.92 (L) 4.40 - 5.90 MIL/uL   Hemoglobin 11.2 (L) 13.0 - 18.0 g/dL   HCT 35.0 (L) 40.0 - 52.0 %   MCV 89.4 80.0 - 100.0 fL   MCH 28.6 26.0 - 34.0 pg   MCHC 32.0 32.0 - 36.0 g/dL   RDW 15.7 (H) 11.5 - 14.5 %   Platelets 266 150 - 440 K/uL  Glucose, capillary     Status: None   Collection Time: 01/24/15  8:11 AM  Result Value Ref Range   Glucose-Capillary 93  65 - 99 mg/dL  Glucose, capillary     Status: Abnormal   Collection Time: 01/24/15 11:13 AM  Result Value Ref Range   Glucose-Capillary 114 (H) 65 - 99 mg/dL     Assessment & Plan  1. Cellulitis of left lower extremity -resolved  2. Need for influenza vaccination  - Flu Vaccine QUAD 36+ mos PF IM (Fluarix & Fluzone Quad PF)

## 2015-02-03 NOTE — Patient Instructions (Signed)
Continue his current meds and cont. to lose some weight.

## 2015-02-06 ENCOUNTER — Telehealth: Payer: Self-pay | Admitting: *Deleted

## 2015-02-06 NOTE — Telephone Encounter (Signed)
Called patient to see if he requested brace from outside company. Patient girlfriend called back and stated yes.

## 2015-02-24 ENCOUNTER — Other Ambulatory Visit: Payer: Self-pay | Admitting: Family Medicine

## 2015-03-03 DIAGNOSIS — M1A9XX1 Chronic gout, unspecified, with tophus (tophi): Secondary | ICD-10-CM | POA: Diagnosis not present

## 2015-03-03 DIAGNOSIS — Z6841 Body Mass Index (BMI) 40.0 and over, adult: Secondary | ICD-10-CM | POA: Diagnosis not present

## 2015-04-14 DIAGNOSIS — M1A9XX1 Chronic gout, unspecified, with tophus (tophi): Secondary | ICD-10-CM | POA: Diagnosis not present

## 2015-04-24 ENCOUNTER — Other Ambulatory Visit: Payer: Self-pay | Admitting: Family Medicine

## 2015-04-24 ENCOUNTER — Telehealth: Payer: Self-pay

## 2015-04-24 MED ORDER — PROMETHAZINE HCL 25 MG PO TABS: 25.0000 mg | ORAL_TABLET | Freq: Two times a day (BID) | ORAL | Status: DC

## 2015-04-24 NOTE — Telephone Encounter (Signed)
Please review this

## 2015-04-24 NOTE — Telephone Encounter (Signed)
Patients girlfriend called requesting a refill on [name] promethazine 25mg .  Patient has appt on 05/06/15.  Please advise patient with completed. Alan Eaton

## 2015-04-24 NOTE — Telephone Encounter (Signed)
Done.-jh 

## 2015-05-01 ENCOUNTER — Telehealth: Payer: Self-pay | Admitting: Family Medicine

## 2015-05-01 NOTE — Telephone Encounter (Signed)
Dr. Luan Pulling changed the number of times he wanted pt to test.  [employer] had faxed a form for Dr. Luan Pulling to sigh and it was sent back.  He needs to go back and initial and date step 2.  Can you find that form in pt's chart and have it corrected so it can be faxed back

## 2015-05-02 NOTE — Telephone Encounter (Signed)
Pt girlfriend called and advised her the same thing and fax number was given to her waiting for paperwork Nisha.

## 2015-05-02 NOTE — Telephone Encounter (Signed)
Call pt they were faxing at wrong number as per Alan Eaton so advised to fax the form back to [place] at right number.

## 2015-05-05 ENCOUNTER — Other Ambulatory Visit: Payer: Self-pay | Admitting: Emergency Medicine

## 2015-05-05 DIAGNOSIS — S0990XA Unspecified injury of head, initial encounter: Secondary | ICD-10-CM

## 2015-05-05 DIAGNOSIS — J209 Acute bronchitis, unspecified: Secondary | ICD-10-CM | POA: Diagnosis not present

## 2015-05-05 DIAGNOSIS — S060X1A Concussion with loss of consciousness of 30 minutes or less, initial encounter: Secondary | ICD-10-CM | POA: Diagnosis not present

## 2015-05-05 NOTE — Telephone Encounter (Signed)
Will await form

## 2015-05-06 ENCOUNTER — Ambulatory Visit: Payer: Self-pay | Admitting: Family Medicine

## 2015-05-06 ENCOUNTER — Ambulatory Visit: Admission: RE | Admit: 2015-05-06 | Payer: Medicare Other | Source: Ambulatory Visit

## 2015-05-06 ENCOUNTER — Other Ambulatory Visit: Payer: Self-pay | Admitting: Family Medicine

## 2015-05-15 DIAGNOSIS — M1A00X0 Idiopathic chronic gout, unspecified site, without tophus (tophi): Secondary | ICD-10-CM | POA: Diagnosis not present

## 2015-05-23 ENCOUNTER — Emergency Department: Payer: Medicare Other

## 2015-05-23 ENCOUNTER — Inpatient Hospital Stay
Admission: EM | Admit: 2015-05-23 | Discharge: 2015-05-26 | DRG: 917 | Disposition: A | Discharge status: Home / Self Care | Payer: Medicare Other | Attending: Internal Medicine | Admitting: Internal Medicine

## 2015-05-23 ENCOUNTER — Inpatient Hospital Stay: Payer: Medicare Other

## 2015-05-23 DIAGNOSIS — Z91030 Bee allergy status: Secondary | ICD-10-CM

## 2015-05-23 DIAGNOSIS — J9601 Acute respiratory failure with hypoxia: Secondary | ICD-10-CM | POA: Diagnosis not present

## 2015-05-23 DIAGNOSIS — T426X1A Poisoning by other antiepileptic and sedative-hypnotic drugs, accidental (unintentional), initial encounter: Principal | ICD-10-CM | POA: Diagnosis present

## 2015-05-23 DIAGNOSIS — I10 Essential (primary) hypertension: Secondary | ICD-10-CM | POA: Diagnosis present

## 2015-05-23 DIAGNOSIS — Z833 Family history of diabetes mellitus: Secondary | ICD-10-CM | POA: Diagnosis not present

## 2015-05-23 DIAGNOSIS — Z7982 Long term (current) use of aspirin: Secondary | ICD-10-CM | POA: Diagnosis not present

## 2015-05-23 DIAGNOSIS — Z79899 Other long term (current) drug therapy: Secondary | ICD-10-CM

## 2015-05-23 DIAGNOSIS — Z23 Encounter for immunization: Secondary | ICD-10-CM

## 2015-05-23 DIAGNOSIS — F329 Major depressive disorder, single episode, unspecified: Secondary | ICD-10-CM | POA: Diagnosis not present

## 2015-05-23 DIAGNOSIS — Y906 Blood alcohol level of 120-199 mg/100 ml: Secondary | ICD-10-CM | POA: Diagnosis present

## 2015-05-23 DIAGNOSIS — E662 Morbid (severe) obesity with alveolar hypoventilation: Secondary | ICD-10-CM | POA: Diagnosis not present

## 2015-05-23 DIAGNOSIS — R0603 Acute respiratory distress: Secondary | ICD-10-CM

## 2015-05-23 DIAGNOSIS — R0682 Tachypnea, not elsewhere classified: Secondary | ICD-10-CM | POA: Diagnosis not present

## 2015-05-23 DIAGNOSIS — K219 Gastro-esophageal reflux disease without esophagitis: Secondary | ICD-10-CM | POA: Diagnosis not present

## 2015-05-23 DIAGNOSIS — M109 Gout, unspecified: Secondary | ICD-10-CM | POA: Diagnosis present

## 2015-05-23 DIAGNOSIS — Z818 Family history of other mental and behavioral disorders: Secondary | ICD-10-CM

## 2015-05-23 DIAGNOSIS — Z915 Personal history of self-harm: Secondary | ICD-10-CM | POA: Diagnosis not present

## 2015-05-23 DIAGNOSIS — F1290 Cannabis use, unspecified, uncomplicated: Secondary | ICD-10-CM | POA: Diagnosis not present

## 2015-05-23 DIAGNOSIS — F1010 Alcohol abuse, uncomplicated: Secondary | ICD-10-CM | POA: Diagnosis present

## 2015-05-23 DIAGNOSIS — G9340 Encephalopathy, unspecified: Secondary | ICD-10-CM | POA: Diagnosis not present

## 2015-05-23 DIAGNOSIS — E119 Type 2 diabetes mellitus without complications: Secondary | ICD-10-CM | POA: Diagnosis not present

## 2015-05-23 DIAGNOSIS — Z888 Allergy status to other drugs, medicaments and biological substances status: Secondary | ICD-10-CM

## 2015-05-23 DIAGNOSIS — T50902A Poisoning by unspecified drugs, medicaments and biological substances, intentional self-harm, initial encounter: Secondary | ICD-10-CM | POA: Diagnosis not present

## 2015-05-23 DIAGNOSIS — Z8249 Family history of ischemic heart disease and other diseases of the circulatory system: Secondary | ICD-10-CM | POA: Diagnosis not present

## 2015-05-23 DIAGNOSIS — G4730 Sleep apnea, unspecified: Secondary | ICD-10-CM | POA: Diagnosis present

## 2015-05-23 DIAGNOSIS — F1994 Other psychoactive substance use, unspecified with psychoactive substance-induced mood disorder: Secondary | ICD-10-CM | POA: Diagnosis not present

## 2015-05-23 DIAGNOSIS — Z6841 Body Mass Index (BMI) 40.0 and over, adult: Secondary | ICD-10-CM | POA: Diagnosis not present

## 2015-05-23 DIAGNOSIS — T590X1A Toxic effect of nitrogen oxides, accidental (unintentional), initial encounter: Secondary | ICD-10-CM | POA: Diagnosis not present

## 2015-05-23 DIAGNOSIS — J9600 Acute respiratory failure, unspecified whether with hypoxia or hypercapnia: Secondary | ICD-10-CM | POA: Diagnosis not present

## 2015-05-23 DIAGNOSIS — R000 Tachycardia, unspecified: Secondary | ICD-10-CM | POA: Diagnosis present

## 2015-05-23 DIAGNOSIS — R0600 Dyspnea, unspecified: Secondary | ICD-10-CM | POA: Diagnosis present

## 2015-05-23 DIAGNOSIS — J9602 Acute respiratory failure with hypercapnia: Secondary | ICD-10-CM | POA: Diagnosis not present

## 2015-05-23 DIAGNOSIS — F17210 Nicotine dependence, cigarettes, uncomplicated: Secondary | ICD-10-CM | POA: Diagnosis present

## 2015-05-23 DIAGNOSIS — R0602 Shortness of breath: Secondary | ICD-10-CM | POA: Diagnosis not present

## 2015-05-23 DIAGNOSIS — T4272XA Poisoning by unspecified antiepileptic and sedative-hypnotic drugs, intentional self-harm, initial encounter: Secondary | ICD-10-CM | POA: Diagnosis not present

## 2015-05-23 DIAGNOSIS — F10229 Alcohol dependence with intoxication, unspecified: Secondary | ICD-10-CM | POA: Diagnosis not present

## 2015-05-23 DIAGNOSIS — F332 Major depressive disorder, recurrent severe without psychotic features: Secondary | ICD-10-CM | POA: Diagnosis present

## 2015-05-23 DIAGNOSIS — G92 Toxic encephalopathy: Secondary | ICD-10-CM | POA: Diagnosis not present

## 2015-05-23 DIAGNOSIS — T50904A Poisoning by unspecified drugs, medicaments and biological substances, undetermined, initial encounter: Secondary | ICD-10-CM | POA: Diagnosis not present

## 2015-05-23 DIAGNOSIS — T1491 Suicide attempt: Secondary | ICD-10-CM | POA: Diagnosis not present

## 2015-05-23 HISTORY — DX: Alcohol abuse, uncomplicated: F10.10

## 2015-05-23 HISTORY — DX: Suicidal ideations: R45.851

## 2015-05-23 HISTORY — DX: Depression, unspecified: F32.A

## 2015-05-23 HISTORY — DX: Major depressive disorder, single episode, unspecified: F32.9

## 2015-05-23 HISTORY — DX: Morbid (severe) obesity due to excess calories: E66.01

## 2015-05-23 LAB — URINE DRUG SCREEN, QUALITATIVE (ARMC ONLY)
AMPHETAMINES, UR SCREEN: NOT DETECTED
Barbiturates, Ur Screen: NOT DETECTED
Benzodiazepine, Ur Scrn: POSITIVE — AB
CANNABINOID 50 NG, UR ~~LOC~~: NOT DETECTED
COCAINE METABOLITE, UR ~~LOC~~: NOT DETECTED
MDMA (ECSTASY) UR SCREEN: NOT DETECTED
Methadone Scn, Ur: NOT DETECTED
Opiate, Ur Screen: NOT DETECTED
PHENCYCLIDINE (PCP) UR S: NOT DETECTED
Tricyclic, Ur Screen: NOT DETECTED

## 2015-05-23 LAB — CBC WITH DIFFERENTIAL/PLATELET
BASOS ABS: 0.1 10*3/uL (ref 0–0.1)
Basophils Relative: 1 %
Eosinophils Absolute: 0.5 10*3/uL (ref 0–0.7)
Eosinophils Relative: 4 %
HCT: 43.3 % (ref 40.0–52.0)
HEMOGLOBIN: 14.5 g/dL (ref 13.0–18.0)
LYMPHS ABS: 3.7 10*3/uL — AB (ref 1.0–3.6)
LYMPHS PCT: 31 %
MCH: 29.6 pg (ref 26.0–34.0)
MCHC: 33.5 g/dL (ref 32.0–36.0)
MCV: 88.3 fL (ref 80.0–100.0)
Monocytes Absolute: 1 10*3/uL (ref 0.2–1.0)
Monocytes Relative: 8 %
NEUTROS ABS: 6.6 10*3/uL — AB (ref 1.4–6.5)
NEUTROS PCT: 56 %
Platelets: 254 10*3/uL (ref 150–440)
RBC: 4.9 MIL/uL (ref 4.40–5.90)
RDW: 17 % — ABNORMAL HIGH (ref 11.5–14.5)
WBC: 12 10*3/uL — AB (ref 3.8–10.6)

## 2015-05-23 LAB — URINALYSIS COMPLETE WITH MICROSCOPIC (ARMC ONLY)
Bilirubin Urine: NEGATIVE
GLUCOSE, UA: NEGATIVE mg/dL
HGB URINE DIPSTICK: NEGATIVE
KETONES UR: NEGATIVE mg/dL
LEUKOCYTES UA: NEGATIVE
NITRITE: NEGATIVE
Protein, ur: 100 mg/dL — AB
SPECIFIC GRAVITY, URINE: 1.017 (ref 1.005–1.030)
pH: 5 (ref 5.0–8.0)

## 2015-05-23 LAB — COMPREHENSIVE METABOLIC PANEL
ALBUMIN: 3.9 g/dL (ref 3.5–5.0)
ALT: 68 U/L — ABNORMAL HIGH (ref 17–63)
AST: 49 U/L — AB (ref 15–41)
Alkaline Phosphatase: 52 U/L (ref 38–126)
Anion gap: 7 (ref 5–15)
BILIRUBIN TOTAL: 0.2 mg/dL — AB (ref 0.3–1.2)
BUN: 10 mg/dL (ref 6–20)
CHLORIDE: 106 mmol/L (ref 101–111)
CO2: 28 mmol/L (ref 22–32)
Calcium: 9 mg/dL (ref 8.9–10.3)
Creatinine, Ser: 1.22 mg/dL (ref 0.61–1.24)
GFR calc Af Amer: >60 mL/min (ref >60)
GFR calc non Af Amer: >60 mL/min (ref >60)
GLUCOSE: 110 mg/dL — AB (ref 65–99)
POTASSIUM: 3.7 mmol/L (ref 3.5–5.1)
SODIUM: 141 mmol/L (ref 135–145)
Total Protein: 8 g/dL (ref 6.5–8.1)

## 2015-05-23 LAB — BLOOD GAS, VENOUS
ACID-BASE EXCESS: 0.6 mmol/L (ref 0.0–3.0)
Bicarbonate: 27.4 mEq/L (ref 21.0–28.0)
FIO2: 28
O2 SAT: 81.1 %
PCO2 VEN: 52 mmHg (ref 44.0–60.0)
PH VEN: 7.33 (ref 7.320–7.430)
Patient temperature: 37
pO2, Ven: <31 mmHg (ref 30.0–45.0)

## 2015-05-23 LAB — TROPONIN I
Troponin I: <0.03 ng/mL (ref <0.031)
Troponin I: <0.03 ng/mL (ref <0.031)

## 2015-05-23 LAB — GLUCOSE, CAPILLARY
GLUCOSE-CAPILLARY: 107 mg/dL — AB (ref 65–99)
GLUCOSE-CAPILLARY: 97 mg/dL (ref 65–99)
Glucose-Capillary: 81 mg/dL (ref 65–99)
Glucose-Capillary: 101 mg/dL — ABNORMAL HIGH (ref 65–99)

## 2015-05-23 LAB — ACETAMINOPHEN LEVEL
Acetaminophen (Tylenol), Serum: <10 ug/mL — ABNORMAL LOW (ref 10–30)
Acetaminophen (Tylenol), Serum: <10 ug/mL — ABNORMAL LOW (ref 10–30)

## 2015-05-23 LAB — SALICYLATE LEVEL: Salicylate Lvl: <4 mg/dL (ref 2.8–30.0)

## 2015-05-23 LAB — BRAIN NATRIURETIC PEPTIDE: B NATRIURETIC PEPTIDE 5: 122 pg/mL — AB (ref 0.0–100.0)

## 2015-05-23 LAB — FIBRIN DERIVATIVES D-DIMER (ARMC ONLY): Fibrin derivatives D-dimer (ARMC): 1001 — ABNORMAL HIGH (ref 0–499)

## 2015-05-23 LAB — MRSA PCR SCREENING: MRSA BY PCR: NEGATIVE

## 2015-05-23 LAB — ETHANOL: Alcohol, Ethyl (B): 150 mg/dL — ABNORMAL HIGH (ref <5)

## 2015-05-23 MED ORDER — FOLIC ACID 1 MG PO TABS
1.0000 mg | ORAL_TABLET | Freq: Every day | ORAL | Status: DC
  Administered 2015-05-24 – 2015-05-26 (×3): 1 mg via ORAL
  Filled 2015-05-23 (×4): qty 1

## 2015-05-23 MED ORDER — POLYETHYLENE GLYCOL 3350 17 G PO PACK: 17.0000 g | PACK | Freq: Every day | ORAL | Status: DC | PRN

## 2015-05-23 MED ORDER — IOHEXOL 350 MG/ML SOLN
100.0000 mL | Freq: Once | INTRAVENOUS | Status: AC | PRN
  Administered 2015-05-23: 100 mL via INTRAVENOUS

## 2015-05-23 MED ORDER — IPRATROPIUM-ALBUTEROL 0.5-2.5 (3) MG/3ML IN SOLN: 3.0000 mL | RESPIRATORY_TRACT | Status: DC | PRN

## 2015-05-23 MED ORDER — METOPROLOL TARTRATE 25 MG PO TABS
25.0000 mg | ORAL_TABLET | Freq: Two times a day (BID) | ORAL | Status: DC
  Administered 2015-05-23 – 2015-05-26 (×6): 25 mg via ORAL
  Filled 2015-05-23 (×8): qty 1

## 2015-05-23 MED ORDER — LABETALOL HCL 5 MG/ML IV SOLN
10.0000 mg | INTRAVENOUS | Status: DC | PRN
  Filled 2015-05-23: qty 4

## 2015-05-23 MED ORDER — IPRATROPIUM-ALBUTEROL 0.5-2.5 (3) MG/3ML IN SOLN
RESPIRATORY_TRACT | Status: AC
  Administered 2015-05-23: 3 mL via RESPIRATORY_TRACT
  Filled 2015-05-23: qty 3

## 2015-05-23 MED ORDER — PANTOPRAZOLE SODIUM 40 MG IV SOLR
40.0000 mg | INTRAVENOUS | Status: DC
  Administered 2015-05-23 – 2015-05-25 (×3): 40 mg via INTRAVENOUS
  Filled 2015-05-23 (×3): qty 40

## 2015-05-23 MED ORDER — DIAZEPAM 5 MG/ML IJ SOLN: 5.0000 mg | INTRAMUSCULAR | Status: DC | PRN

## 2015-05-23 MED ORDER — ENOXAPARIN SODIUM 40 MG/0.4ML ~~LOC~~ SOLN: 40.0000 mg | SUBCUTANEOUS | Status: DC

## 2015-05-23 MED ORDER — ACETAMINOPHEN 325 MG PO TABS
650.0000 mg | ORAL_TABLET | Freq: Four times a day (QID) | ORAL | Status: DC | PRN
  Administered 2015-05-24: 650 mg via ORAL
  Filled 2015-05-23: qty 2

## 2015-05-23 MED ORDER — ENOXAPARIN SODIUM 40 MG/0.4ML ~~LOC~~ SOLN
40.0000 mg | Freq: Two times a day (BID) | SUBCUTANEOUS | Status: DC
  Administered 2015-05-24 – 2015-05-26 (×6): 40 mg via SUBCUTANEOUS
  Filled 2015-05-23 (×6): qty 0.4

## 2015-05-23 MED ORDER — IPRATROPIUM-ALBUTEROL 0.5-2.5 (3) MG/3ML IN SOLN
3.0000 mL | Freq: Once | RESPIRATORY_TRACT | Status: AC
  Administered 2015-05-23: 3 mL via RESPIRATORY_TRACT

## 2015-05-23 MED ORDER — INSULIN ASPART 100 UNIT/ML ~~LOC~~ SOLN: 0.0000 [IU] | SUBCUTANEOUS | Status: DC

## 2015-05-23 MED ORDER — VITAMIN B-1 100 MG PO TABS
100.0000 mg | ORAL_TABLET | Freq: Every day | ORAL | Status: DC
  Administered 2015-05-24 – 2015-05-26 (×3): 100 mg via ORAL
  Filled 2015-05-23 (×3): qty 1

## 2015-05-23 MED ORDER — SODIUM CHLORIDE 0.9 % IV SOLN
Freq: Once | INTRAVENOUS | Status: AC
  Administered 2015-05-23: 12:00:00 via INTRAVENOUS

## 2015-05-23 MED ORDER — CETYLPYRIDINIUM CHLORIDE 0.05 % MT LIQD
7.0000 mL | Freq: Two times a day (BID) | OROMUCOSAL | Status: DC
  Administered 2015-05-23: 7 mL via OROMUCOSAL

## 2015-05-23 MED ORDER — SODIUM CHLORIDE 0.9% FLUSH
3.0000 mL | Freq: Two times a day (BID) | INTRAVENOUS | Status: DC
  Administered 2015-05-23 – 2015-05-25 (×5): 3 mL via INTRAVENOUS

## 2015-05-23 MED ORDER — DEXMEDETOMIDINE HCL IN NACL 400 MCG/100ML IV SOLN
0.2000 ug/kg/h | INTRAVENOUS | Status: DC
  Administered 2015-05-23: 0.5 ug/kg/h via INTRAVENOUS
  Administered 2015-05-23: 0.3 ug/kg/h via INTRAVENOUS
  Filled 2015-05-23 (×3): qty 100

## 2015-05-23 MED ORDER — NICOTINE 21 MG/24HR TD PT24
21.0000 mg | MEDICATED_PATCH | Freq: Every day | TRANSDERMAL | Status: DC
  Administered 2015-05-23 – 2015-05-24 (×3): 21 mg via TRANSDERMAL
  Filled 2015-05-23 (×4): qty 1

## 2015-05-23 MED ORDER — PNEUMOCOCCAL VAC POLYVALENT 25 MCG/0.5ML IJ INJ
0.5000 mL | INJECTION | INTRAMUSCULAR | Status: DC
Start: 2015-05-24 — End: 2015-05-26
  Filled 2015-05-23: qty 0.5

## 2015-05-23 MED ORDER — CHLORHEXIDINE GLUCONATE 0.12 % MT SOLN
15.0000 mL | Freq: Two times a day (BID) | OROMUCOSAL | Status: DC
  Administered 2015-05-23 (×2): 15 mL via OROMUCOSAL
  Filled 2015-05-23 (×2): qty 15

## 2015-05-23 MED ORDER — MORPHINE SULFATE (PF) 2 MG/ML IV SOLN
2.0000 mg | INTRAVENOUS | Status: DC | PRN
  Administered 2015-05-23: 2 mg via INTRAVENOUS
  Filled 2015-05-23: qty 1

## 2015-05-23 MED ORDER — PIPERACILLIN-TAZOBACTAM 3.375 G IVPB
3.3750 g | Freq: Once | INTRAVENOUS | Status: AC
  Administered 2015-05-23: 3.375 g via INTRAVENOUS
  Filled 2015-05-23: qty 50

## 2015-05-23 MED ORDER — ACETAMINOPHEN 650 MG RE SUPP: 650.0000 mg | Freq: Four times a day (QID) | RECTAL | Status: DC | PRN

## 2015-05-23 MED ORDER — INFLUENZA VAC SPLIT QUAD 0.5 ML IM SUSY: 0.5000 mL | PREFILLED_SYRINGE | INTRAMUSCULAR | Status: DC

## 2015-05-23 MED ORDER — ADULT MULTIVITAMIN W/MINERALS CH
1.0000 | ORAL_TABLET | Freq: Every day | ORAL | Status: DC
  Administered 2015-05-24 – 2015-05-26 (×3): 1 via ORAL
  Filled 2015-05-23 (×3): qty 1

## 2015-05-23 MED ORDER — SODIUM CHLORIDE 0.9 % IV BOLUS (SEPSIS)
1000.0000 mL | Freq: Once | INTRAVENOUS | Status: AC
  Administered 2015-05-23: 1000 mL via INTRAVENOUS

## 2015-05-23 NOTE — Progress Notes (Signed)
Patient resting at this time and calm. Alert talking to sitter who is at bedside.  Precedex drip infusing.  Alert to self and place but has confused conversations with slurred/garbled speech. Stach per cardiac monitor.  O2 sats upper 90's on 3L nasal cannula. RR low to mid 30's.  Patient to wear bipap at night.
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
## 2015-05-23 NOTE — ED Notes (Signed)
IVC/Patient admitted to ICU

## 2015-05-23 NOTE — ED Notes (Signed)
Pts O2 stats dropping to 88% when pt sleeping, Pt placed on 2L, O2 at 97%.

## 2015-05-23 NOTE — ED Notes (Signed)
Pt placed on bipap  
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
## 2015-05-23 NOTE — ED Notes (Signed)
Pt taken to toilet, which is stopped up. Pt defecated and asked for assistance with wiping. This nurse provided assistance as needed, changed linens, and provided pt with clean, dry clothing.

## 2015-05-23 NOTE — ED Provider Notes (Signed)
[facility] Emergency Department Provider Note  ____________________________________________  Time seen: Approximately 2:10 AM  I have reviewed the triage vital signs and the nursing notes.   HISTORY  Chief Complaint Drug Overdose    HPI Alan Eaton is a 40 y.o. male under involuntary commitment for a suicide attempt.  Patienttells me that he's been arguing with his "wife" of 20 years. He tells me that tonight he attempted suicide by taking over 20 Ambien tablets as well as several Lortab tablets. He currently reports that they have done nothing to him. He reports he is suicidal. He reports previous history of suicidality.  Denies any fevers chills or cough. Denies any chest pain nausea or vomiting. Denies any confusion or weakness. Does affirm suicidality.    Past Medical History  Diagnosis Date  . Gout   . GERD (gastroesophageal reflux disease)   . Hypertension   . Diabetes mellitus without complication ([place])   . Depression     Patient Active Problem List   Diagnosis Date Noted  . Sepsis ([place]) 01/23/2015    Past Surgical History  Procedure Laterality Date  . None      Current Outpatient Rx  Name  Route  Sig  Dispense  Refill  . aspirin 81 MG tablet   Oral   Take 81 mg by mouth daily.         . B Complex-C (B-COMPLEX WITH VITAMIN C) tablet   Oral   Take 1 tablet by mouth daily.         Marland Kitchen dicyclomine (BENTYL) 20 MG tablet      TAKE 1 TABLET BY MOUTH ONCE DAILY.   30 tablet   6   . famotidine (PEPCID) 40 MG tablet      TAKE 1 TABLET BY MOUTH AT BEDTIME FOR STOMACH ACID.   30 tablet   6   . losartan (COZAAR) 100 MG tablet      TAKE 1 TABLET BY MOUTH ONCE DAILY.   90 tablet   1   . metFORMIN (GLUCOPHAGE) 500 MG tablet   Oral   Take 1 tablet (500 mg total) by mouth daily with breakfast.   90 tablet   3   . metoprolol succinate (TOPROL XL) 100 MG 24 hr tablet   Oral   Take 1 tablet (100 mg total) by mouth daily.  Take with or immediately following a meal.   30 tablet   0   . Multiple Vitamins-Minerals (MULTIVITAMIN WITH MINERALS) tablet   Oral   Take 1 tablet by mouth daily.         Marland Kitchen omeprazole (PRILOSEC) 20 MG capsule      Take 1 capsule by mouth twice daily   60 capsule   12   . promethazine (PHENERGAN) 25 MG tablet   Oral   Take 1 tablet (25 mg total) by mouth 2 (two) times daily.   60 tablet   3     Allergies Bee venom and Bactrim  Family History  Problem Relation Age of Onset  . Hypertension    . Diabetes Mellitus II      Social History Social History  Substance Use Topics  . Smoking status: Current Every Day Smoker -- 0.50 packs/day    Types: Cigarettes  . Smokeless tobacco: Not on file  . Alcohol Use: Yes     Comment: occasional    Review of Systems Constitutional: No fever/chills Eyes: No visual changes. ENT: No sore throat. Cardiovascular: Denies chest  pain. Respiratory: Denies shortness of breath. Gastrointestinal: No abdominal pain.  No nausea, no vomiting.  No diarrhea.  No constipation. Genitourinary: Negative for dysuria. Musculoskeletal: Negative for back pain. Skin: Negative for rash. Neurological: Negative for headaches, focal weakness or numbness.  Patient does report feeling suicidal. He also reports he uses alcohol regularly and had some tonight.  10-point ROS otherwise negative.  ____________________________________________   PHYSICAL EXAM:  VITAL SIGNS: ED Triage Vitals  Enc Vitals Group     BP 05/23/15 0145 165/110 mmHg     Pulse Rate 05/23/15 0145 133     Resp --      Temp 05/23/15 0145 98.3 F (36.8 C)     Temp Source 05/23/15 0145 Oral     SpO2 05/23/15 0145 96 %     Weight 05/23/15 0145 325 lb (147.419 kg)     Height 05/23/15 0145 6\' 2"  (1.88 m)     Head Cir --      Peak Flow --      Pain Score 05/23/15 0145 10     Pain Loc --      Pain Edu? --      Excl. in [place]? --    Constitutional: Alert and oriented. Disheveled,  but in no distress seated at edge of the bed Eyes: Conjunctivae are normal. PERRL. EOMI. Head: Atraumatic. Nose: No congestion/rhinnorhea. Mouth/Throat: Mucous membranes are moist.  Oropharynx non-erythematous. Neck: No stridor.   Cardiovascular: Tachycardic rate, regular rhythm. Grossly normal heart sounds.  Good peripheral circulation. Respiratory: Normal respiratory effort.  No retractions. Lungs CTAB. Gastrointestinal: Soft and nontender. Morbidly obese. Musculoskeletal: No lower extremity tenderness nor edema.  No joint effusions. Neurologic:  Just slightly slurred speech and language. No gross focal neurologic deficits are appreciated. Skin:  Skin is warm, dry and intact. No rash noted. Psychiatric: Mood and affect are normal. Speech and behavior are normal.  ____________________________________________   LABS (all labs ordered are listed, but only abnormal results are displayed)  Labs Reviewed  COMPREHENSIVE METABOLIC PANEL - Abnormal; Notable for the following:    Glucose, Bld 110 (*)    AST 49 (*)    ALT 68 (*)    Total Bilirubin 0.2 (*)    All other components within normal limits  ETHANOL - Abnormal; Notable for the following:    Alcohol, Ethyl (B) 150 (*)    All other components within normal limits  ACETAMINOPHEN LEVEL - Abnormal; Notable for the following:    Acetaminophen (Tylenol), Serum <10 (*)    All other components within normal limits  CBC WITH DIFFERENTIAL/PLATELET - Abnormal; Notable for the following:    WBC 12.0 (*)    RDW 17.0 (*)    Neutro Abs 6.6 (*)    Lymphs Abs 3.7 (*)    All other components within normal limits  SALICYLATE LEVEL  URINE DRUG SCREEN, QUALITATIVE ([facility] ONLY)  ACETAMINOPHEN LEVEL  SALICYLATE LEVEL  URINALYSIS COMPLETEWITH MICROSCOPIC ([facility] ONLY)   ____________________________________________  EKG  Reviewed and interpreted by me at 1:45 AM Sinus tachycardia Heart rate 135 QRS 80 QTc 440 No evidence of acute ischemic  abnormality is noted, possible old anterior MI. No evidence of acute ST elevation or significant depressions. QTC is normal. No significant elevation in aVR greater than 4 mm. ____________________________________________  RADIOLOGY  DG Chest Port 1 View (Final result) Result time: 05/23/15 02:35:42   Final result by Rad Results In Interface (05/23/15 02:35:42)   Narrative:   CLINICAL DATA: Drug overdose, possible sepsis.  History of hypertension, diabetes.  EXAM: PORTABLE CHEST 1 VIEW  COMPARISON: Chest radiograph January 23, 2015  FINDINGS: Cardiomediastinal silhouette is unremarkable for this low inspiratory portable examination with crowded vasculature markings. The lungs are clear without pleural effusions or focal consolidations. RIGHT costophrenic angle incompletely imaged. Trachea projects midline and there is no pneumothorax. Included soft tissue planes and osseous structures are non-suspicious. Large body habitus  IMPRESSION: Negative portable chest radiograph.   Electronically Signed By: Elon Alas M.D. On: 05/23/2015 02:35    ____________________________________________   PROCEDURES  Procedure(s) performed: None  Critical Care performed: No  ____________________________________________   INITIAL IMPRESSION / ASSESSMENT AND PLAN / ED COURSE  Pertinent labs & imaging results that were available during my care of the patient were reviewed by me and considered in my medical decision making (see chart for details).   Patient presents for suicide attempt. Does also report using alcohol and reportedly overdosing on Ambien and Lortab approximately 2 hours ago, however by clinical exam his speech is just slightly slurred but no acute neurologic deficits, he is fully awake and alert, he is not somnolent, he is not bradypnea, his pupils are not pinpoint. He does not show evidence of obvious acute overdose at this time, however we will check  acetaminophen level now as well as repeat at 4 hours to assure no change or elevation in the setting of a reported Lortab overdose though again the patient does not appear to be somnolent. No clear toxidrome is noted though speech is just slightly slurred and he does report he was drinking.  Given the patient's tachycardia I will obtain a chest x-ray as well as urinalysis to evaluate for infection. He does have a history of sepsis secondary to cellulitis though this is not apparent on today's examination. We will screen carefully and follow the patient closely clinically. The present time he is awake alert and in no distress, compliant with care and cooperative.  Patient under involuntary commitment.  ----------------------------------------- 3:09 AM on 05/23/2015 -----------------------------------------  Ongoing care and disposition assigned to Dr. Marcelene Butte of the ace side. Initial labs are reassuring with negative acetaminophen and Tylenol levels. There is a minimal transaminitis however the patient does have a history of alcohol use and noted current intoxication (ethanol 150) likely explains his slight slurred speech on presentation. The patient will continue to be monitored carefully, I have ordered repeat vital signs, repeat acetaminophen and Tylenol level at 6 AM. Psychiatric consultation has been requested.  Chest x-rays obtained does not demonstrate acute disease.  Plan of care above and ongoing observation by Dr. Marcelene Butte. ____________________________________________   FINAL CLINICAL IMPRESSION(S) / ED DIAGNOSES  Final diagnoses:  Severe episode of recurrent major depressive disorder, without psychotic features (HCC)  Overdose, intentional self-harm, initial encounter (HCC)      Delman Kitten, MD 05/23/15 [phone]

## 2015-05-23 NOTE — ED Notes (Signed)
Resumed care from Teresita. Pt is snoring loudly and his leads, oxygen cannula, etc removed, as pt is moving around in bed a lot.Pt responds to questions, although states he does not know what he took.

## 2015-05-23 NOTE — ED Provider Notes (Addendum)
Patient with evidence of sleep apnea. I will obtain a venous blood gas and temporarily place him on BiPAP.  Patient appears improved, however tachypnea And has obvious sleep apnea and airway obstruction due to his body habitus. Patient may have aspirated due to his overdose, I will give IV Zosyn and recommend admission. CRITICAL CARE Performed by: Earleen Newport    Total critical care time: 30 minutes  Critical care time was exclusive of separately billable procedures and treating other patients.  Critical care was necessary to treat or prevent imminent or life-threatening deterioration.  Critical care was time spent personally by me on the following activities: development of treatment plan with patient and/or surrogate as well as nursing, discussions with consultants, evaluation of patient's response to treatment, examination of patient, obtaining history from patient or surrogate, ordering and performing treatments and interventions, ordering and review of laboratory studies, ordering and review of radiographic studies, pulse oximetry and re-evaluation of patient's condition.   Earleen Newport, MD 05/23/15 1000  Earleen Newport, MD 05/23/15 [phone]

## 2015-05-23 NOTE — H&P (Signed)
[place] [facility] [place] NAME: Alan Eaton    MR#:  [id number]  DATE OF BIRTH:  1975/08/26  DATE OF ADMISSION:  05/23/2015  PRIMARY CARE PHYSICIAN: Alan Doe, MD   REQUESTING/REFERRING PHYSICIAN: Dr. Jimmye Eaton  CHIEF COMPLAINT:   Chief Complaint  Patient presents with  . Drug Overdose    HISTORY OF PRESENT ILLNESS:  Alan Eaton  is a 40 y.o. male with a known history of diabetes, hypertension, morbid obesity, depression with prior suicide attempts and alcohol abuse present. To the emergency room after overdose on Lortab and Ambien. Patient has an IVC in place. He was noticed to be tachycardic and tachypneic. Placed on a BiPAP. Chest x-ray was clear and venous blood gas was fairly normal. Patient is presently drowsy and unable to give me much history. When patient was removed on the BiPAP he continued to be tachypneic. Also his alcohol level was 150.  PAST MEDICAL HISTORY:   Past Medical History  Diagnosis Date  . Gout   . GERD (gastroesophageal reflux disease)   . Hypertension   . Diabetes mellitus without complication ([place])   . Depression   . Alcohol abuse   . Morbid obesity ([place])     PAST SURGICAL HISTORY:   Past Surgical History  Procedure Laterality Date  . None      SOCIAL HISTORY:   Social History  Substance Use Topics  . Smoking status: Current Every Day Smoker -- 0.50 packs/day    Types: Cigarettes  . Smokeless tobacco: Not on file  . Alcohol Use: 0.0 oz/week    0 Standard drinks or equivalent per week     Comment: occasional    FAMILY HISTORY:   Family History  Problem Relation Age of Onset  . Hypertension    . Diabetes Mellitus II      DRUG ALLERGIES:   Allergies  Allergen Reactions  . Bee Venom Swelling  . Bactrim [Sulfamethoxazole-Trimethoprim] Swelling and Rash    REVIEW OF SYSTEMS:   Review of Systems  Unable to perform ROS: mental status change    MEDICATIONS AT HOME:    Prior to Admission medications   Medication Sig Start Date End Date Taking? Authorizing Provider  allopurinol (ZYLOPRIM) 100 MG tablet Take 200 mg by mouth daily.   Yes Historical Provider, MD  famotidine (PEPCID) 40 MG tablet TAKE 1 TABLET BY MOUTH AT BEDTIME FOR STOMACH ACID. 05/06/15  Yes Alan Eaton., MD  metoprolol succinate (TOPROL-XL) 50 MG 24 hr tablet Take 50 mg by mouth daily. Take with or immediately following a meal.   Yes Historical Provider, MD  omeprazole (PRILOSEC) 20 MG capsule Take 1 capsule by mouth twice daily 11/26/14  Yes Alan Eaton., MD  promethazine (PHENERGAN) 25 MG tablet Take 1 tablet (25 mg total) by mouth 2 (two) times daily. 04/24/15  Yes Alan Eaton., MD  aspirin 81 MG tablet Take 81 mg by mouth daily.    Historical Provider, MD  B Complex-C (B-COMPLEX WITH VITAMIN C) tablet Take 1 tablet by mouth daily.    Historical Provider, MD  dicyclomine (BENTYL) 20 MG tablet TAKE 1 TABLET BY MOUTH ONCE DAILY. 12/23/14   Alan Eaton., MD  losartan (COZAAR) 100 MG tablet TAKE 1 TABLET BY MOUTH ONCE DAILY. 01/21/15   Alan Eaton., MD  metFORMIN (GLUCOPHAGE) 500 MG tablet Take 1 tablet (500 mg total) by mouth daily with breakfast. 10/28/14  Alan Eaton., MD  metoprolol succinate (TOPROL XL) 100 MG 24 hr tablet Take 1 tablet (100 mg total) by mouth daily. Take with or immediately following a meal. 01/24/15   Alan Sane, MD  Multiple Vitamins-Minerals (MULTIVITAMIN WITH MINERALS) tablet Take 1 tablet by mouth daily.    Historical Provider, MD     VITAL SIGNS:  Blood pressure 145/79, pulse 116, temperature 98.2 F (36.8 C), temperature source Axillary, resp. rate 33, height 6\' 2"  (1.88 m), weight 147.419 kg (325 lb), SpO2 100 %.  PHYSICAL EXAMINATION:  Physical Exam  GENERAL:  40 y.o.-year-old patient lying in the bed , drowsy and tachypneic. Morbidly obese EYES: Pupils equal, round, reactive to light and accommodation. No scleral icterus.  Extraocular muscles intact.  HEENT: Head atraumatic, normocephalic. Oropharynx and nasopharynx clear. No oropharyngeal erythema, moist oral mucosa  NECK:  Supple, no jugular venous distention. No thyroid enlargement, no tenderness.  LUNGS: Increased work of breathing. Clear on both sides. CARDIOVASCULAR: S1, S2 normal. Tachycardia ABDOMEN: Soft, nontender, nondistended. Bowel sounds present. No organomegaly or mass.  EXTREMITIES: No pedal edema, cyanosis, or clubbing. + 2 pedal & radial pulses b/l.   NEUROLOGIC: Cranial nerves II through XII are intact. No focal Motor or sensory deficits appreciated b/l PSYCHIATRIC: The patient is drowzy SKIN: No obvious rash, lesion, or ulcer.   LABORATORY PANEL:   CBC  Recent Labs Lab 05/23/15 0214  WBC 12.0*  HGB 14.5  HCT 43.3  PLT 254   ------------------------------------------------------------------------------------------------------------------  Chemistries   Recent Labs Lab 05/23/15 0214  NA 141  K 3.7  CL 106  CO2 28  GLUCOSE 110*  BUN 10  CREATININE 1.22  CALCIUM 9.0  AST 49*  ALT 68*  ALKPHOS 52  BILITOT 0.2*   ------------------------------------------------------------------------------------------------------------------  Cardiac Enzymes No results for input(s): TROPONINI in the last 168 hours. ------------------------------------------------------------------------------------------------------------------  RADIOLOGY:  Dg Chest Port 1 View  05/23/2015  CLINICAL DATA:  Drug overdose, possible sepsis. History of hypertension, diabetes. EXAM: PORTABLE CHEST 1 VIEW COMPARISON:  Chest radiograph January 23, 2015 FINDINGS: Cardiomediastinal silhouette is unremarkable for this low inspiratory portable examination with crowded vasculature markings. The lungs are clear without pleural effusions or focal consolidations. RIGHT costophrenic angle incompletely imaged. Trachea projects midline and there is no pneumothorax.  Included soft tissue planes and osseous structures are non-suspicious. Large body habitus IMPRESSION: Negative portable chest radiograph. Electronically Signed   By: Elon Alas M.D.   On: 05/23/2015 02:35     IMPRESSION AND PLAN:   * Acute encephalopathy due to overdose on Ambien and Lortab IVC in place. Consult psychiatry. Admitted to stepdown.  * Acute respiratory failure Will check CTA chest for PE. Tachycardia and tachypnea. Discussed with Dr. Mortimer Fries of pulmonology. Continue BiPAP.  * Diabetes mellitus Sliding scale insulin  * Hypertension Medications on hold as patient is nothing by mouth   * DVT prophylaxis with Lovenox   All the records are reviewed and case discussed with ED provider. Management plans discussed with the patient, family and they are in agreement.  CODE STATUS: FULL  TOTAL CC TIME TAKING CARE OF THIS PATIENT: 40 minutes.   Hillary Bow R M.D on 05/23/2015 at 12:10 PM  Between 7am to 6pm - Pager - [phone]  After 6pm go to [email or web address] - password [email or web address] [facility]  Office  [phone]  CC: Primary care physician; Alan Doe, MD  Note: This dictation was prepared with Dragon dictation along with smaller phrase technology. Any  transcriptional errors that result from this process are unintentional.

## 2015-05-23 NOTE — ED Notes (Signed)
Consult to TTS put on hold due to pts inability to articulate sentences/clear speech. Dr. Wilford Grist (psych) notified.

## 2015-05-23 NOTE — Consult Note (Signed)
PULMONARY / CRITICAL CARE MEDICINE   Name: Alan Eaton MRN: [id number] DOB: [birth year]    ADMISSION DATE:  05/23/2015   CONSULTATION DATE:  05/23/2015  REFERRING MD:  Dr. Darvin Neighbours  CHIEF COMPLAINT:  Drug overdose  HISTORY OF PRESENT ILLNESS: This is a 40 year old white male with a past medical history of hypertension, morbid obesity, type 2 diabetes, alcohol abuse, and depression who presents with a potential drug overdose. History is obtained from ED records, as well as from patient. Per ED records, patient has been arguing with his wife. Patient has been arguing with his wife and decided to attempt suicide by taking about 31 pills including Ambien and Lortab and also drunk a lot of liquor. He has a history of previous suicide attempts.  His toxicology report was negative for acetaminophen, salicylates,but positive for benzodiazepine. At the ED, he dropped his oxygen saturation to the low 90s. He was placed on BiPAP and admitted to the ICU. CCM was consulted for further management. Patient is awake on BiPAP. He denies pain, chest pain, palpitations, nausea and vomiting. He is requesting to have the BiPAP mask removed.   PAST MEDICAL HISTORY :  He  has a past medical history of Gout; GERD (gastroesophageal reflux disease); Hypertension; Diabetes mellitus without complication ([place]); Depression; Alcohol abuse; and Morbid obesity ([place]).  PAST SURGICAL HISTORY: He  has past surgical history that includes none.  Allergies  Allergen Reactions  . Bee Venom Swelling  . Bactrim [Sulfamethoxazole-Trimethoprim] Swelling and Rash    No current facility-administered medications on file prior to encounter.   Current Outpatient Prescriptions on File Prior to Encounter  Medication Sig  . famotidine (PEPCID) 40 MG tablet TAKE 1 TABLET BY MOUTH AT BEDTIME FOR STOMACH ACID.  Marland Kitchen omeprazole (PRILOSEC) 20 MG capsule Take 1 capsule by mouth twice daily  . promethazine (PHENERGAN) 25 MG tablet Take 1  tablet (25 mg total) by mouth 2 (two) times daily.  Marland Kitchen aspirin 81 MG tablet Take 81 mg by mouth daily.  . B Complex-C (B-COMPLEX WITH VITAMIN C) tablet Take 1 tablet by mouth daily.  Marland Kitchen dicyclomine (BENTYL) 20 MG tablet TAKE 1 TABLET BY MOUTH ONCE DAILY.  Marland Kitchen losartan (COZAAR) 100 MG tablet TAKE 1 TABLET BY MOUTH ONCE DAILY.  . metFORMIN (GLUCOPHAGE) 500 MG tablet Take 1 tablet (500 mg total) by mouth daily with breakfast.  . metoprolol succinate (TOPROL XL) 100 MG 24 hr tablet Take 1 tablet (100 mg total) by mouth daily. Take with or immediately following a meal.  . Multiple Vitamins-Minerals (MULTIVITAMIN WITH MINERALS) tablet Take 1 tablet by mouth daily.    FAMILY HISTORY:  His has no family status information on file.   SOCIAL HISTORY: He  reports that he has been smoking Cigarettes.  He has been smoking about 0.50 packs per day. He does not have any smokeless tobacco history on file. He reports that he drinks alcohol. He reports that he uses illicit drugs (Marijuana) about once per week.  REVIEW OF SYSTEMS:   All systems reviewed. Pertinent positives include mild dyspnea, depression, anxiety, and suicidal ideation. All other systems are negative   SUBJECTIVE:   VITAL SIGNS: BP 125/105 mmHg  Pulse 115  Temp(Src) 98.1 F (36.7 C) (Axillary)  Resp 23  Ht 6\' 2"  (1.88 m)  Wt 343 lb 11.2 oz (155.9 kg)  BMI 44.11 kg/m2  SpO2 100%  HEMODYNAMICS:    VENTILATOR SETTINGS: Vent Mode:  [-]  FiO2 (%):  [35 %] 35 %  INTAKE / OUTPUT:    PHYSICAL EXAMINATION: General: Morbidly obese, in mild respiratory distress Neuro: Alert and oriented 3, moves all extremities, no focal deficits HEENT:  normocephalic and nontraumatic, sclera anicteric, PERRLA, oral mucosa dry Cardiovascular:  apical pulse palpable, tachycardic, S1, S2, no murmur, regurg and gallop. Lungs:  mild increase in work of breathing, bilateral airflow, no wheezing or rhonchi Abdomen: Obese, normal bowel sounds, no palpable  organomegaly  Musculoskeletal:  positive range of motion in bilateral upper and lower extremities Skin:  warm and dry  Extremities: +2 pulses bilaterally, no edema  LABS:  BMET  Recent Labs Lab 05/23/15 0214  NA 141  K 3.7  CL 106  CO2 28  BUN 10  CREATININE 1.22  GLUCOSE 110*    Electrolytes  Recent Labs Lab 05/23/15 0214  CALCIUM 9.0    CBC  Recent Labs Lab 05/23/15 0214  WBC 12.0*  HGB 14.5  HCT 43.3  PLT 254    Coag's No results for input(s): APTT, INR in the last 168 hours.  Sepsis Markers No results for input(s): LATICACIDVEN, PROCALCITON, O2SATVEN in the last 168 hours.  ABG No results for input(s): PHART, PCO2ART, PO2ART in the last 168 hours.  Liver Enzymes  Recent Labs Lab 05/23/15 0214  AST 49*  ALT 68*  ALKPHOS 52  BILITOT 0.2*  ALBUMIN 3.9    Cardiac Enzymes No results for input(s): TROPONINI, PROBNP in the last 168 hours.  Glucose  Recent Labs Lab 05/23/15 1345  GLUCAP 97    Imaging Dg Chest Port 1 View  05/23/2015  CLINICAL DATA:  Drug overdose, possible sepsis. History of hypertension, diabetes. EXAM: PORTABLE CHEST 1 VIEW COMPARISON:  Chest radiograph January 23, 2015 FINDINGS: Cardiomediastinal silhouette is unremarkable for this low inspiratory portable examination with crowded vasculature markings. The lungs are clear without pleural effusions or focal consolidations. RIGHT costophrenic angle incompletely imaged. Trachea projects midline and there is no pneumothorax. Included soft tissue planes and osseous structures are non-suspicious. Large body habitus IMPRESSION: Negative portable chest radiograph. Electronically Signed   By: Elon Alas M.D.   On: 05/23/2015 02:35     STUDIES:  05/23/2015: CT chest PE study pending   CULTURES: 05-23-2015 MRSA screen negative  ANTIBIOTICS: 05/23/2015. Zosyn.  SIGNIFICANT EVENTS: 05/23/2015: OD>ED>Acute respiratory  failure>BiPAP>ICU  LINES/TUBES: PIVs  DISCUSSION: His is a 40 year old male presenting with acute respiratory failure secondary to overdose, obesity hypoventilation syndrome, and suicide attempt by overdose. He remains tachycardic and saturating okay on BiPAP.  ASSESSMENT / PLAN:  PULMONARY A: Acute hypoxic respiratory failure secondary to overdose Obesity hypoventilation syndrome ?Apsiration pneumonia R/O PE P:   -Continuous BiPAP and at HS as tolerated and titrated to nasal cannula.  -Titrate BiPAP and supplemental oxygen to keep FiO2 88-94%. - Minimize sedatives -CTA chest PE study -D-dimer level -Nebulized bronchodilators  CARDIOVASCULAR A:  HISTORY OF HYPERTENSION  MORBID OBESITY P: -Hemodynamic monitoring per ICU protocol -Start metoprolol 25 mg bid with holding parameters  RENAL A:   No acute issues P:   -Monitor I/O -Monitor and correct electrolytes -IV fluids  GASTROINTESTINAL A:   H/o GERD P:   -Home dose of PPI  HEMATOLOGIC A:   No acute issues P:  -Lovenox for VTE prevention  INFECTIOUS A:   Possible aspiration pneumonitis P:   -Empiric antibiotics as above -Pan culture of febrile  ENDOCRINE A:   Type 2 DM P:   -Blood glucose monitoring with SSI coverage  NEUROLOGIC A:   Drug overdose-unclear what  substances patient took; tox screen unremarkable except for benzodiazepine.  ETOH abuse Acute metabolic encephalopathy 2/2 OD P:   RASS goal: 0 to -1 -Precedex gtt and CIWA monitoring -Neuro checks -IV Fluids   Psychiatric A Suicide attempt P -Sitter at bedside -Psychiatric consult  FAMILY  - Updates: No family at bedside  - Inter-disciplinary family meet or Palliative Care meeting due prior to discharge   Total CCM time 45 minutes  [facility] ANP-BC Pulmonary and [place] Pager [phone]  05/23/2015, 2:15 PM  Pt seen and examined with NP, agree with assessment and plan.  Acute respiratory failure likely due to suicide attempt- benzo OD Lorrin Mais), now appears to be improving. Will check D-Dimer to rule out PE.  Continue bipap qhs and prn.   -Marda Stalker, M.D.   Critical Care Attestation.  I have personally obtained a history, examined the patient, evaluated laboratory and imaging results, formulated the assessment and plan and placed orders. The Patient requires high complexity decision making for assessment and support, frequent evaluation and titration of therapies, application of advanced monitoring technologies and extensive interpretation of multiple databases. The patient has critical illness that could lead imminently to failure of 1 or more organ systems and requires the highest level of physician preparedness to intervene.  Critical Care Time devoted to patient care services described in this note is 35 minutes and is exclusive of time spent in procedures.

## 2015-05-23 NOTE — ED Notes (Signed)
Pt went to restroom and this nurse noted that he was wheezing. Upon assessment, expiratory wheeze bilaterally. Dr. Jimmye Norman notifed. Received order for duoneb treatment.

## 2015-05-23 NOTE — ED Notes (Signed)
Pt took a mix of 31 pills including ambien and lortab.  Is unsure of what else he took.  Pt trying to kill himself.  States he also drank 4 40s and 2 shots of moonshine.

## 2015-05-23 NOTE — ED Notes (Signed)
Pt has pulled out IV from right hand during his movement.

## 2015-05-24 ENCOUNTER — Encounter: Payer: Self-pay | Admitting: *Deleted

## 2015-05-24 DIAGNOSIS — F332 Major depressive disorder, recurrent severe without psychotic features: Secondary | ICD-10-CM

## 2015-05-24 DIAGNOSIS — F10229 Alcohol dependence with intoxication, unspecified: Secondary | ICD-10-CM

## 2015-05-24 LAB — CBC
HEMATOCRIT: 37.5 % — AB (ref 40.0–52.0)
Hemoglobin: 12.3 g/dL — ABNORMAL LOW (ref 13.0–18.0)
MCH: 29.1 pg (ref 26.0–34.0)
MCHC: 32.7 g/dL (ref 32.0–36.0)
MCV: 88.8 fL (ref 80.0–100.0)
Platelets: 208 10*3/uL (ref 150–440)
RBC: 4.22 MIL/uL — ABNORMAL LOW (ref 4.40–5.90)
RDW: 17.5 % — AB (ref 11.5–14.5)
WBC: 11.4 10*3/uL — ABNORMAL HIGH (ref 3.8–10.6)

## 2015-05-24 LAB — GLUCOSE, CAPILLARY
GLUCOSE-CAPILLARY: 112 mg/dL — AB (ref 65–99)
GLUCOSE-CAPILLARY: 95 mg/dL (ref 65–99)
Glucose-Capillary: 113 mg/dL — ABNORMAL HIGH (ref 65–99)
Glucose-Capillary: 112 mg/dL — ABNORMAL HIGH (ref 65–99)
Glucose-Capillary: 108 mg/dL — ABNORMAL HIGH (ref 65–99)

## 2015-05-24 LAB — BASIC METABOLIC PANEL
Anion gap: 5 (ref 5–15)
BUN: 18 mg/dL (ref 6–20)
CHLORIDE: 110 mmol/L (ref 101–111)
CO2: 29 mmol/L (ref 22–32)
Calcium: 8.8 mg/dL — ABNORMAL LOW (ref 8.9–10.3)
Creatinine, Ser: 1.33 mg/dL — ABNORMAL HIGH (ref 0.61–1.24)
GFR calc Af Amer: >60 mL/min (ref >60)
GLUCOSE: 111 mg/dL — AB (ref 65–99)
POTASSIUM: 4.3 mmol/L (ref 3.5–5.1)
Sodium: 144 mmol/L (ref 135–145)

## 2015-05-24 LAB — TROPONIN I: Troponin I: <0.03 ng/mL (ref <0.031)

## 2015-05-24 LAB — PHOSPHORUS: Phosphorus: 3 mg/dL (ref 2.5–4.6)

## 2015-05-24 LAB — MAGNESIUM: Magnesium: 1.8 mg/dL (ref 1.7–2.4)

## 2015-05-24 MED ORDER — CETYLPYRIDINIUM CHLORIDE 0.05 % MT LIQD
7.0000 mL | Freq: Two times a day (BID) | OROMUCOSAL | Status: DC
  Administered 2015-05-24 – 2015-05-25 (×4): 7 mL via OROMUCOSAL

## 2015-05-24 MED ORDER — INSULIN ASPART 100 UNIT/ML ~~LOC~~ SOLN: 0.0000 [IU] | Freq: Every day | SUBCUTANEOUS | Status: DC

## 2015-05-24 MED ORDER — GUAIFENESIN-DM 100-10 MG/5ML PO SYRP
10.0000 mL | ORAL_SOLUTION | Freq: Four times a day (QID) | ORAL | Status: DC | PRN
  Administered 2015-05-24 – 2015-05-26 (×6): 10 mL via ORAL
  Filled 2015-05-24 (×6): qty 10

## 2015-05-24 MED ORDER — PANTOPRAZOLE SODIUM 40 MG PO TBEC
40.0000 mg | DELAYED_RELEASE_TABLET | Freq: Every day | ORAL | Status: DC
  Administered 2015-05-24 – 2015-05-26 (×3): 40 mg via ORAL
  Filled 2015-05-24 (×3): qty 1

## 2015-05-24 MED ORDER — COLCHICINE 0.6 MG PO TABS
0.6000 mg | ORAL_TABLET | Freq: Every day | ORAL | Status: DC
  Administered 2015-05-24 – 2015-05-26 (×3): 0.6 mg via ORAL
  Filled 2015-05-24 (×3): qty 1

## 2015-05-24 MED ORDER — METFORMIN HCL 500 MG PO TABS
500.0000 mg | ORAL_TABLET | Freq: Every day | ORAL | Status: DC
  Administered 2015-05-24 – 2015-05-26 (×3): 500 mg via ORAL
  Filled 2015-05-24 (×3): qty 1

## 2015-05-24 MED ORDER — MULTI-VITAMIN/MINERALS PO TABS: 1.0000 | ORAL_TABLET | Freq: Every day | ORAL | Status: DC

## 2015-05-24 MED ORDER — ALLOPURINOL 100 MG PO TABS
200.0000 mg | ORAL_TABLET | Freq: Every day | ORAL | Status: DC
  Administered 2015-05-24 – 2015-05-26 (×3): 200 mg via ORAL
  Filled 2015-05-24 (×3): qty 2

## 2015-05-24 MED ORDER — B COMPLEX-C PO TABS
1.0000 | ORAL_TABLET | Freq: Every day | ORAL | Status: DC
  Administered 2015-05-24: 1 via ORAL
  Filled 2015-05-24 (×2): qty 1

## 2015-05-24 MED ORDER — FAMOTIDINE 20 MG PO TABS
40.0000 mg | ORAL_TABLET | Freq: Every day | ORAL | Status: DC
  Administered 2015-05-24 – 2015-05-25 (×2): 40 mg via ORAL
  Filled 2015-05-24 (×2): qty 2

## 2015-05-24 MED ORDER — ASPIRIN EC 81 MG PO TBEC
81.0000 mg | DELAYED_RELEASE_TABLET | Freq: Every day | ORAL | Status: DC
  Administered 2015-05-24 – 2015-05-26 (×3): 81 mg via ORAL
  Filled 2015-05-24 (×3): qty 1

## 2015-05-24 MED ORDER — MENTHOL 3 MG MT LOZG
1.0000 | LOZENGE | OROMUCOSAL | Status: DC | PRN
  Administered 2015-05-24 – 2015-05-25 (×5): 3 mg via ORAL
  Filled 2015-05-24 (×2): qty 9

## 2015-05-24 MED ORDER — PNEUMOCOCCAL VAC POLYVALENT 25 MCG/0.5ML IJ INJ: 0.5000 mL | INJECTION | INTRAMUSCULAR | Status: DC | PRN

## 2015-05-24 MED ORDER — INSULIN ASPART 100 UNIT/ML ~~LOC~~ SOLN
0.0000 [IU] | Freq: Three times a day (TID) | SUBCUTANEOUS | Status: DC
  Filled 2015-05-24: qty 2

## 2015-05-24 NOTE — Progress Notes (Signed)
Patient ID: Alan Eaton, male   DOB: [birth year], 40 y.o.   MRN: [id number] [place] [facility] [place] NAME: Alan Eaton    MR#:  [id number]  DATE OF BIRTH:  [birth year]   came in with overdose on Lortab with suicidal ideation after argument with wife.  Patient to be placed on BiPAP. Sats at present 100%. Felt better. He wants to get off BiPAP.  Currently on nasal cannula oxygen sats are 100%. Low-dose IV Precedex drip. Feeling hungry once a breakfast.  REVIEW OF SYSTEMS:   Review of Systems  Constitutional: Negative for fever, chills and weight loss.  HENT: Negative for ear discharge, ear pain and nosebleeds.   Eyes: Negative for blurred vision, pain and discharge.  Respiratory: Negative for sputum production, shortness of breath, wheezing and stridor.   Cardiovascular: Negative for chest pain, palpitations, orthopnea and PND.  Gastrointestinal: Negative for nausea, vomiting, abdominal pain and diarrhea.  Genitourinary: Negative for urgency and frequency.  Musculoskeletal: Negative for back pain and joint pain.  Neurological: Positive for weakness. Negative for sensory change, speech change and focal weakness.  Psychiatric/Behavioral: Negative for depression and hallucinations. The patient is not nervous/anxious.   All other systems reviewed and are negative.  Tolerating Diet: yes  Tolerating PT:  pending   DRUG ALLERGIES:   Allergies  Allergen Reactions  . Bee Venom Swelling  . Bactrim [Sulfamethoxazole-Trimethoprim] Swelling and Rash    VITALS:  Blood pressure 110/78, pulse 78, temperature 98.4 F (36.9 C), temperature source Axillary, resp. rate 26, height 6\' 2"  (1.88 m), weight 154.6 kg (340 lb 13.3 oz), SpO2 100 %.  PHYSICAL EXAMINATION:   Physical Exam  GENERAL:  40 y.o.-year-old patient lying in the bed with no acute distress.  morbidly obese  EYES: Pupils equal, round, reactive to light and accommodation. No  scleral icterus. Extraocular muscles intact.  HEENT: Head atraumatic, normocephalic. Oropharynx and nasopharynx clear.  NECK:  Supple, no jugular venous distention. No thyroid enlargement, no tenderness. distantath sounds bilaterally, no wheezing, rales, rhonchi. No use of accessory muscles of respiration.  CARDIOVASCULAR: S1, S2 normal. No murmurs, rubs, or gallops.  ABDOMEN: Soft, nontender, nondistended. Bowel sounds present. No organomegaly or mass.  EXTREMITIES: No cyanosis, clubbing or edema b/l.    NEUROLOGIC: Cranial nerves II through XII are intact. No focal Motor or sensory deficits b/l.   PSYCHIATRIC:  patient is alert and oriented x 3.  SKIN: No obvious rash, lesion, or ulcer.   LABORATORY PANEL:  CBC  Recent Labs Lab 05/24/15 0213  WBC 11.4*  HGB 12.3*  HCT 37.5*  PLT 208    Chemistries   Recent Labs Lab 05/23/15 0214 05/24/15 0213  NA 141 144  K 3.7 4.3  CL 106 110  CO2 28 29  GLUCOSE 110* 111*  BUN 10 18  CREATININE 1.22 1.33*  CALCIUM 9.0 8.8*  MG  --  1.8  AST 49*  --   ALT 68*  --   ALKPHOS 52  --   BILITOT 0.2*  --    Cardiac Enzymes  Recent Labs Lab 05/24/15 0213  TROPONINI <0.03   RADIOLOGY:  Ct Angio Chest Pe W/cm &/or Wo Cm  05/23/2015  CLINICAL DATA:  Shortness of breath EXAM: CT ANGIOGRAPHY CHEST WITH CONTRAST TECHNIQUE: Multidetector CT imaging of the chest was performed using the standard protocol during bolus administration of intravenous contrast. Multiplanar CT image reconstructions and MIPs were obtained to evaluate the vascular anatomy. CONTRAST:  196mL OMNIPAQUE IOHEXOL 350 MG/ML SOLN COMPARISON:  05/23/2015 FINDINGS: Mediastinum/Nodes: Thoracic inlet is normal. No significant hilar or mediastinal adenopathy. No significant pericardial effusion. Thoracic aorta shows no dissection or dilatation. There are no filling defects appreciated in the pulmonary arterial system. However, evaluation lower lobe third order branches is  significantly limited by respiratory motion artifact. New motion artifact also compromises evaluation of upper lobe branches. Lungs/Pleura: No pleural effusion. No evidence of infiltrate or consolidation. Evaluation of upper and lower lobe parenchyma limited by motion artifact. Upper abdomen: Hepatic steatosis.  No acute findings. 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 No acute abnormalities. Review of the MIP images confirms the above findings. IMPRESSION: Although no pulmonary emboli are detected, the study is moderately limited by respiratory motion which could obscure small peripheral emboli. Electronically Signed   By: Skipper Cliche M.D.   On: 05/23/2015 18:37   Dg Chest Port 1 View  05/23/2015  CLINICAL DATA:  Drug overdose, possible sepsis. History of hypertension, diabetes. EXAM: PORTABLE CHEST 1 VIEW COMPARISON:  Chest radiograph January 23, 2015 FINDINGS: Cardiomediastinal silhouette is unremarkable for this low inspiratory portable examination with crowded vasculature markings. The lungs are clear without pleural effusions or focal consolidations. RIGHT costophrenic angle incompletely imaged. Trachea projects midline and there is no pneumothorax. Included soft tissue planes and osseous structures are non-suspicious. Large body habitus IMPRESSION: Negative portable chest radiograph. Electronically Signed   By: Elon Alas M.D.   On: 05/23/2015 02:35   ASSESSMENT AND PLAN:  Alan Eaton is a 40 y.o. male with a known history of diabetes, hypertension, morbid obesity, depression with prior suicide attempts and alcohol abuse present. To the emergency room after overdose on Lortab and Ambien. Patient has an IVC in place. He was noticed to be tachycardic and tachypneic  * Acute encephalopathy due to overdose on Ambien and Lortab IVC in place. Consult psychiatry. patient presented awake alert oriented 3.   we'll try to wean him off the Precedex drip.  *  Acute respiratory failure -Now off BiPAP. On these until oxygen. Tachycardia and tachypnea-improved  -Continue BiPAP prn.  * Diabetes mellitus Sliding scale insulin Resume home meds   * HypertensionBlood pressure bit on the lower side. We'll continue to hold her by mouth home meds.  * DVT prophylaxis with Lovenox  *Morbid obesity with obesity hypoventilation syndrome.   patient reports not having CPAP at home. He has sleep apnea. We'll have CM look into this.  Transfer to medical floor later today.  Case discussed with Care Management/Social Worker. Management plans discussed with the patient, family and they are in agreement.  CODE STATUS: full  DVT Prophylaxis: lovnoex  TOTAL TIME TAKING CARE OF THIS PATIENT: 35 minutes.  >50% time spent on counselling and coordination of care  POSSIBLE D/C IN 1 DAYS, DEPENDING ON CLINICAL CONDITION.  Note: This dictation was prepared with Dragon dictation along with smaller phrase technology. Any transcriptional errors that result from this process are unintentional.  Michale Weikel M.D on 05/24/2015 at 7:20 AM  Between 7am to 6pm - Pager - [phone]  After 6pm go to [email or web address] - password [email or web address] [facility]  Office  [phone]  CC: Primary care physician; Dicky Doe, MD

## 2015-05-24 NOTE — Consult Note (Signed)
[facility] Face-to-Face Psychiatry Consult   Reason for Consult:  Suicide attempt Referring Physician:  Fritzi Mandes, M.D  Patient Identification: Alan Eaton MRN:  [id number] Principal Diagnosis: Major depressive disorder recurrent severe without psychotic features.                                      Alcohol use disorder severe Diagnosis:   Patient Active Problem List   Diagnosis Date Noted  . Overdose [T50.901A] 05/23/2015  . Sepsis ([place]) [A41.9] 01/23/2015    Total Time spent with patient: 1 hour  Subjective:   Alan Eaton is a 40 y.o. male patient with morbid obesity and history of depression and prior history of suicide attempt who presented to the emergency room after he overdosed  Lortab and Ambien. Patient was placed on an IVC.     HPI:   He was initially admitted to the ICU due to his tachycardia and tachypnea and was placed on BiPAP. He was transferred from the ICU today and was evaluated in the presence of his mother. During my interview patient reported that his old lady was arguing with him and he took a bunch of pills. He reported that we argue a lot. He reported that she blames a lot on me. Everything is bothering them and most of the time it's related to the kids and sometimes it's because of him. He reported that whenever he drinks she gets upset. He reported that they have been married off and on for the past 13 years. He reported that this is the first time he has tried to hurt himself. He took a bunch of Ambien. Patient reported that he is currently not prescribed Ambien but it was from the previous prescription. He reported that after taking the  the pills, he became dizzy and laid down on the couch and  his wife called the police as he was trying to get her to understand that he is trying to kill himself. Patient reported that he is currently on SSDI and stays at home. Collateral information was obtained from his mother Alan Eaton. She reported that patient has been drinking  a lot and whenever he consumes alcohol he becomes bizarre and start acting weird. She reported that cops have been coming to their house multiple times to talk to him. She reported that he does not want to go to any rehabilitation program. She reported that he has DWI for drinking and driving with a court date of March 29. She wants him to go to a [facility].  She reported that patient does not control his drinking and becomes depressed and loses temper regularly He was minimizing his drinking during this interview. Patient was unable to contract for safety at this time.    Past Psychiatric History:  Patient has previous suicide attempt when he was arguing with his mother. He reported that he took a bunch of pills at that time as well. He is not seeing any psychiatrist at this time   Risk to Self: Suicidal Ideation: Yes-Currently Present Suicidal Intent: Yes-Currently Present Is patient at risk for suicide?: Yes Suicidal Plan?: Yes-Currently Present Specify Current Suicidal Plan: Pt reports taking an overdose of prescription medications. Access to Means: Yes Specify Access to Suicidal Means: Pt has access to pills What has been your use of drugs/alcohol within the last 12 months?: Beer How many times?: 0 Other Self Harm Risks: None identified Triggers for  Past Attempts: Other (Comment) Intentional Self Injurious Behavior: None Risk to Others: Homicidal Ideation: No Thoughts of Harm to Others: No Current Homicidal Intent: No Current Homicidal Plan: No Access to Homicidal Means: No Identified Victim: None identified History of harm to others?: No Assessment of Violence: None Noted Violent Behavior Description: N/A Does patient have access to weapons?: No Criminal Charges Pending?: Yes Describe Pending Criminal Charges: DWI Does patient have a court date: Yes Court Date: 06/18/15 Prior Inpatient Therapy: Prior Inpatient Therapy: No Prior Therapy Dates: N/A Prior Therapy  Facilty/Provider(s): N/A Reason for Treatment: N/A Prior Outpatient Therapy: Prior Outpatient Therapy: No Prior Therapy Dates: N/A Prior Therapy Facilty/Provider(s): N/A Reason for Treatment: N/A Does patient have an ACCT team?: No Does patient have Intensive In-House Services?  : No Does patient have [facility] services? : No Does patient have [facility] services?: No  Past Medical History:  Past Medical History  Diagnosis Date  . Gout   . GERD (gastroesophageal reflux disease)   . Hypertension   . Diabetes mellitus without complication ([place])   . Depression   . Alcohol abuse   . Morbid obesity ([place])   . Suicide ideation     Past Surgical History  Procedure Laterality Date  . None     Family History:  Family History  Problem Relation Age of Onset  . Hypertension    . Diabetes Mellitus II     Family Psychiatric  History: h/o  depression in his father Social History:  History  Alcohol Use  . 0.0 oz/week  . 0 Standard drinks or equivalent per week    Comment: occasional     History  Drug Use  . 1.00 per week  . Special: Marijuana    Social History   Social History  . Marital Status: Divorced    Spouse Name: N/A  . Number of Children: N/A  . Years of Education: N/A   Social History Main Topics  . Smoking status: Current Every Day Smoker -- 0.50 packs/day    Types: Cigarettes  . Smokeless tobacco: None  . Alcohol Use: 0.0 oz/week    0 Standard drinks or equivalent per week     Comment: occasional  . Drug Use: 1.00 per week    Special: Marijuana  . Sexual Activity: Not Asked   Other Topics Concern  . None   Social History Narrative   Additional Social History:   Patient is currently married and lives off and on with his wife for the past 13 years. He is currently has a court date on March 29 for DWI Allergies:   Allergies  Allergen Reactions  . Bee Venom Swelling  . Bactrim [Sulfamethoxazole-Trimethoprim] Swelling and Rash    Labs:  Results for orders  placed or performed during the hospital encounter of 05/23/15 (from the past 48 hour(s))  Comprehensive metabolic panel     Status: Abnormal   Collection Time: 05/23/15  2:14 AM  Result Value Ref Range   Sodium 141 135 - 145 mmol/L   Potassium 3.7 3.5 - 5.1 mmol/L   Chloride 106 101 - 111 mmol/L   CO2 28 22 - 32 mmol/L   Glucose, Bld 110 (H) 65 - 99 mg/dL   BUN 10 6 - 20 mg/dL   Creatinine, Ser 1.22 0.61 - 1.24 mg/dL   Calcium 9.0 8.9 - 10.3 mg/dL   Total Protein 8.0 6.5 - 8.1 g/dL   Albumin 3.9 3.5 - 5.0 g/dL   AST 49 (H) 15 - 41  U/L   ALT 68 (H) 17 - 63 U/L   Alkaline Phosphatase 52 38 - 126 U/L   Total Bilirubin 0.2 (L) 0.3 - 1.2 mg/dL   GFR calc non Af Amer >60 >60 mL/min   GFR calc Af Amer >60 >60 mL/min    Comment: (NOTE) The eGFR has been calculated using the CKD EPI equation. This calculation has not been validated in all clinical situations. eGFR's persistently <60 mL/min signify possible Chronic Kidney Disease.    Anion gap 7 5 - 15  Ethanol     Status: Abnormal   Collection Time: 05/23/15  2:14 AM  Result Value Ref Range   Alcohol, Ethyl (B) 150 (H) <5 mg/dL    Comment:        LOWEST DETECTABLE LIMIT FOR SERUM ALCOHOL IS 5 mg/dL FOR MEDICAL PURPOSES ONLY   Salicylate level     Status: None   Collection Time: 05/23/15  2:14 AM  Result Value Ref Range   Salicylate Lvl <1.3 2.8 - 30.0 mg/dL  Acetaminophen level     Status: Abnormal   Collection Time: 05/23/15  2:14 AM  Result Value Ref Range   Acetaminophen (Tylenol), Serum <10 (L) 10 - 30 ug/mL    Comment:        THERAPEUTIC CONCENTRATIONS VARY SIGNIFICANTLY. A RANGE OF 10-30 ug/mL MAY BE AN EFFECTIVE CONCENTRATION FOR MANY PATIENTS. HOWEVER, SOME ARE BEST TREATED AT CONCENTRATIONS OUTSIDE THIS RANGE. ACETAMINOPHEN CONCENTRATIONS >150 ug/mL AT 4 HOURS AFTER INGESTION AND >50 ug/mL AT 12 HOURS AFTER INGESTION ARE OFTEN ASSOCIATED WITH TOXIC REACTIONS.   CBC with Diff     Status: Abnormal    Collection Time: 05/23/15  2:14 AM  Result Value Ref Range   WBC 12.0 (H) 3.8 - 10.6 K/uL   RBC 4.90 4.40 - 5.90 MIL/uL   Hemoglobin 14.5 13.0 - 18.0 g/dL   HCT 43.3 40.0 - 52.0 %   MCV 88.3 80.0 - 100.0 fL   MCH 29.6 26.0 - 34.0 pg   MCHC 33.5 32.0 - 36.0 g/dL   RDW 17.0 (H) 11.5 - 14.5 %   Platelets 254 150 - 440 K/uL   Neutrophils Relative % 56 %   Neutro Abs 6.6 (H) 1.4 - 6.5 K/uL   Lymphocytes Relative 31 %   Lymphs Abs 3.7 (H) 1.0 - 3.6 K/uL   Monocytes Relative 8 %   Monocytes Absolute 1.0 0.2 - 1.0 K/uL   Eosinophils Relative 4 %   Eosinophils Absolute 0.5 0 - 0.7 K/uL   Basophils Relative 1 %   Basophils Absolute 0.1 0 - 0.1 K/uL  Acetaminophen level     Status: Abnormal   Collection Time: 05/23/15  6:55 AM  Result Value Ref Range   Acetaminophen (Tylenol), Serum <10 (L) 10 - 30 ug/mL  Salicylate level     Status: None   Collection Time: 05/23/15  6:55 AM  Result Value Ref Range   Salicylate Lvl <2.4 2.8 - 30.0 mg/dL  Urine Drug Screen, Qualitative ([facility] only)     Status: Abnormal   Collection Time: 05/23/15  8:16 AM  Result Value Ref Range   Tricyclic, Ur Screen NONE DETECTED NONE DETECTED   Amphetamines, Ur Screen NONE DETECTED NONE DETECTED   MDMA (Ecstasy)Ur Screen NONE DETECTED NONE DETECTED   Cocaine Metabolite,Ur [place] NONE DETECTED NONE DETECTED   Opiate, Ur Screen NONE DETECTED NONE DETECTED   Phencyclidine (PCP) Ur S NONE DETECTED NONE DETECTED   Cannabinoid 50 Ng, Ur [place]  NONE DETECTED NONE DETECTED   Barbiturates, Ur Screen NONE DETECTED NONE DETECTED   Benzodiazepine, Ur Scrn POSITIVE (A) NONE DETECTED   Methadone Scn, Ur NONE DETECTED NONE DETECTED    Comment: (NOTE) 048  Tricyclics, urine               Cutoff 1000 ng/mL 200  Amphetamines, urine             Cutoff 1000 ng/mL 300  MDMA (Ecstasy), urine           Cutoff 500 ng/mL 400  Cocaine Metabolite, urine       Cutoff 300 ng/mL 500  Opiate, urine                   Cutoff 300 ng/mL 600   Phencyclidine (PCP), urine      Cutoff 25 ng/mL 700  Cannabinoid, urine              Cutoff 50 ng/mL 800  Barbiturates, urine             Cutoff 200 ng/mL 900  Benzodiazepine, urine           Cutoff 200 ng/mL 1000 Methadone, urine                Cutoff 300 ng/mL 1100 1200 The urine drug screen provides only a preliminary, unconfirmed 1300 analytical test result and should not be used for non-medical 1400 purposes. Clinical consideration and professional judgment should 1500 be applied to any positive drug screen result due to possible 1600 interfering substances. A more specific alternate chemical method 1700 must be used in order to obtain a confirmed analytical result.  1800 Gas chromato graphy / mass spectrometry (GC/MS) is the preferred 1900 confirmatory method.   Urinalysis complete, with microscopic ([facility] only)     Status: Abnormal   Collection Time: 05/23/15  8:16 AM  Result Value Ref Range   Color, Urine AMBER (A) YELLOW   APPearance CLOUDY (A) CLEAR   Glucose, UA NEGATIVE NEGATIVE mg/dL   Bilirubin Urine NEGATIVE NEGATIVE   Ketones, ur NEGATIVE NEGATIVE mg/dL   Specific Gravity, Urine 1.017 1.005 - 1.030   Hgb urine dipstick NEGATIVE NEGATIVE   pH 5.0 5.0 - 8.0   Protein, ur 100 (A) NEGATIVE mg/dL   Nitrite NEGATIVE NEGATIVE   Leukocytes, UA NEGATIVE NEGATIVE   RBC / HPF 0-5 0 - 5 RBC/hpf   WBC, UA 6-30 0 - 5 WBC/hpf   Bacteria, UA RARE (A) NONE SEEN   Squamous Epithelial / LPF 0-5 (A) NONE SEEN   Mucous PRESENT    Hyaline Casts, UA PRESENT   Blood gas, venous     Status: None   Collection Time: 05/23/15 10:08 AM  Result Value Ref Range   FIO2 28.00    Delivery systems NASAL CANNULA    pH, Ven 7.33 7.320 - 7.430   pCO2, Ven 52 44.0 - 60.0 mmHg   pO2, Ven <31.0 30.0 - 45.0 mmHg   Bicarbonate 27.4 21.0 - 28.0 mEq/L   Acid-Base Excess 0.6 0.0 - 3.0 mmol/L   O2 Saturation 81.1 %   Patient temperature 37.0    Collection site VEIN    Sample type VEIN   MRSA PCR  Screening     Status: None   Collection Time: 05/23/15  1:41 PM  Result Value Ref Range   MRSA by PCR NEGATIVE NEGATIVE    Comment:        The GeneXpert MRSA Assay (  FDA approved for NASAL specimens only), is one component of a comprehensive MRSA colonization surveillance program. It is not intended to diagnose MRSA infection nor to guide or monitor treatment for MRSA infections.   Glucose, capillary     Status: None   Collection Time: 05/23/15  1:45 PM  Result Value Ref Range   Glucose-Capillary 97 65 - 99 mg/dL  Troponin I     Status: None   Collection Time: 05/23/15  1:47 PM  Result Value Ref Range   Troponin I <0.03 <0.031 ng/mL    Comment:        NO INDICATION OF MYOCARDIAL INJURY.   Brain natriuretic peptide     Status: Abnormal   Collection Time: 05/23/15  3:13 PM  Result Value Ref Range   B Natriuretic Peptide 122.0 (H) 0.0 - 100.0 pg/mL  Glucose, capillary     Status: None   Collection Time: 05/23/15  3:26 PM  Result Value Ref Range   Glucose-Capillary 81 65 - 99 mg/dL  Blood gas, arterial     Status: Abnormal (Preliminary result)   Collection Time: 05/23/15  3:35 PM  Result Value Ref Range   FIO2 28.00    Delivery systems PENDING    Mode NO CHARGE    Inspiratory PAP PENDING    Expiratory PAP PENDING    pH, Arterial 7.35 7.350 - 7.450   pCO2 arterial 48 32.0 - 48.0 mmHg   pO2, Arterial 96 83.0 - 108.0 mmHg   Bicarbonate 26.5 21.0 - 28.0 mEq/L   Acid-Base Excess 0.3 0.0 - 3.0 mmol/L   O2 Saturation 97.1 %   Patient temperature 37.0    Oxygen index PENDING    Collection site RIGHT RADIAL    Sample type ARTERIAL DRAW    Allens test (pass/fail) YEAST (A) PASS  Fibrin derivatives D-Dimer ([facility] only)     Status: Abnormal   Collection Time: 05/23/15  4:35 PM  Result Value Ref Range   Fibrin derivatives D-dimer ([facility]) 1001 (H) 0 - 499    Comment: <> Exclusion of Venous Thromboembolism (VTE) - OUTPATIENTS ONLY        (Emergency Department or [place])              0-499 ng/ml (FEU)  : With a low to intermediate pretest                                        probability for VTE this test result                                        excludes the diagnosis of VTE.           > 499 ng/ml (FEU)  : VTE not excluded.  Additional work up                                   for VTE is required.   <>  Testing on Inpatients and Evaluation of Disseminated Intravascular        Coagulation (DIC)             Reference Range:   0-499 ng/ml (FEU)   Glucose, capillary     Status: Abnormal   Collection Time: 05/23/15  8:10 PM  Result Value Ref Range   Glucose-Capillary 101 (H) 65 - 99 mg/dL   Comment 1 Notify RN   Troponin I     Status: None   Collection Time: 05/23/15  8:41 PM  Result Value Ref Range   Troponin I <0.03 <0.031 ng/mL    Comment:        NO INDICATION OF MYOCARDIAL INJURY.   Glucose, capillary     Status: Abnormal   Collection Time: 05/23/15 11:34 PM  Result Value Ref Range   Glucose-Capillary 107 (H) 65 - 99 mg/dL   Comment 1 Notify RN   Troponin I     Status: None   Collection Time: 05/24/15  2:13 AM  Result Value Ref Range   Troponin I <0.03 <0.031 ng/mL    Comment:        NO INDICATION OF MYOCARDIAL INJURY.   Basic metabolic panel     Status: Abnormal   Collection Time: 05/24/15  2:13 AM  Result Value Ref Range   Sodium 144 135 - 145 mmol/L   Potassium 4.3 3.5 - 5.1 mmol/L   Chloride 110 101 - 111 mmol/L   CO2 29 22 - 32 mmol/L   Glucose, Bld 111 (H) 65 - 99 mg/dL   BUN 18 6 - 20 mg/dL   Creatinine, Ser 1.33 (H) 0.61 - 1.24 mg/dL   Calcium 8.8 (L) 8.9 - 10.3 mg/dL   GFR calc non Af Amer >60 >60 mL/min   GFR calc Af Amer >60 >60 mL/min    Comment: (NOTE) The eGFR has been calculated using the CKD EPI equation. This calculation has not been validated in all clinical situations. eGFR's persistently <60 mL/min signify possible Chronic Kidney Disease.    Anion gap 5 5 - 15  CBC     Status: Abnormal   Collection Time:  05/24/15  2:13 AM  Result Value Ref Range   WBC 11.4 (H) 3.8 - 10.6 K/uL   RBC 4.22 (L) 4.40 - 5.90 MIL/uL   Hemoglobin 12.3 (L) 13.0 - 18.0 g/dL   HCT 37.5 (L) 40.0 - 52.0 %   MCV 88.8 80.0 - 100.0 fL   MCH 29.1 26.0 - 34.0 pg   MCHC 32.7 32.0 - 36.0 g/dL   RDW 17.5 (H) 11.5 - 14.5 %   Platelets 208 150 - 440 K/uL  Magnesium     Status: None   Collection Time: 05/24/15  2:13 AM  Result Value Ref Range   Magnesium 1.8 1.7 - 2.4 mg/dL  Phosphorus     Status: None   Collection Time: 05/24/15  2:13 AM  Result Value Ref Range   Phosphorus 3.0 2.5 - 4.6 mg/dL  Glucose, capillary     Status: Abnormal   Collection Time: 05/24/15  3:38 AM  Result Value Ref Range   Glucose-Capillary 113 (H) 65 - 99 mg/dL   Comment 1 Notify RN   Glucose, capillary     Status: Abnormal   Collection Time: 05/24/15  7:22 AM  Result Value Ref Range   Glucose-Capillary 112 (H) 65 - 99 mg/dL   Comment 1 Notify RN   Glucose, capillary     Status: Abnormal   Collection Time: 05/24/15 11:43 AM  Result Value Ref Range   Glucose-Capillary 112 (H) 65 - 99 mg/dL   Comment 1 Notify RN     Current Facility-Administered Medications  Medication Dose Route Frequency Provider Last Rate Last Dose  . acetaminophen (TYLENOL) tablet 650 mg  650 mg Oral Q6H PRN Hillary Bow, MD       Or  . acetaminophen (TYLENOL) suppository 650 mg  650 mg Rectal Q6H PRN Srikar Sudini, MD      . allopurinol (ZYLOPRIM) tablet 200 mg  200 mg Oral Daily Fritzi Mandes, MD   200 mg at 05/24/15 1005  . antiseptic oral rinse (CPC / CETYLPYRIDINIUM CHLORIDE 0.05%) solution 7 mL  7 mL Mouth Rinse BID Fritzi Mandes, MD   7 mL at 05/24/15 1010  . aspirin EC tablet 81 mg  81 mg Oral Daily Fritzi Mandes, MD   81 mg at 05/24/15 1005  . B-complex with vitamin C tablet 1 tablet  1 tablet Oral Daily Fritzi Mandes, MD   1 tablet at 05/24/15 1011  . colchicine tablet 0.6 mg  0.6 mg Oral Daily Fritzi Mandes, MD   0.6 mg at 05/24/15 1007  . enoxaparin (LOVENOX) injection  40 mg  40 mg Subcutaneous Q12H Srikar Sudini, MD   40 mg at 05/24/15 1314  . famotidine (PEPCID) tablet 40 mg  40 mg Oral QHS Fritzi Mandes, MD      . folic acid (FOLVITE) tablet 1 mg  1 mg Oral Daily Mikael Spray, NP   1 mg at 05/24/15 1007  . guaiFENesin-dextromethorphan (ROBITUSSIN DM) 100-10 MG/5ML syrup 10 mL  10 mL Oral Q6H PRN Fritzi Mandes, MD   10 mL at 05/24/15 1003  . insulin aspart (novoLOG) injection 0-5 Units  0-5 Units Subcutaneous QHS Fritzi Mandes, MD      . insulin aspart (novoLOG) injection 0-9 Units  0-9 Units Subcutaneous TID WC Fritzi Mandes, MD   0 Units at 05/24/15 1147  . ipratropium-albuterol (DUONEB) 0.5-2.5 (3) MG/3ML nebulizer solution 3 mL  3 mL Nebulization Q4H PRN Srikar Sudini, MD      . labetalol (NORMODYNE,TRANDATE) injection 10 mg  10 mg Intravenous Q4H PRN Srikar Sudini, MD      . menthol-cetylpyridinium (CEPACOL) lozenge 3 mg  1 lozenge Oral PRN Fritzi Mandes, MD   3 mg at 05/24/15 1156  . metFORMIN (GLUCOPHAGE) tablet 500 mg  500 mg Oral Q breakfast Fritzi Mandes, MD   500 mg at 05/24/15 4315  . metoprolol tartrate (LOPRESSOR) tablet 25 mg  25 mg Oral BID Mikael Spray, NP   25 mg at 05/24/15 1005  . morphine 2 MG/ML injection 2 mg  2 mg Intravenous Q4H PRN Hillary Bow, MD   2 mg at 05/23/15 1900  . multivitamin with minerals tablet 1 tablet  1 tablet Oral Daily Mikael Spray, NP   1 tablet at 05/24/15 1007  . nicotine (NICODERM CQ - dosed in mg/24 hours) patch 21 mg  21 mg Transdermal Daily Mikael Spray, NP   21 mg at 05/24/15 1415  . pantoprazole (PROTONIX) EC tablet 40 mg  40 mg Oral Daily Fritzi Mandes, MD   40 mg at 05/24/15 1006  . pantoprazole (PROTONIX) injection 40 mg  40 mg Intravenous Q24H Laverle Hobby, MD   40 mg at 05/23/15 1523  . pneumococcal 23 valent vaccine (PNU-IMMUNE) injection 0.5 mL  0.5 mL Intramuscular Tomorrow-1000 Srikar Sudini, MD   0.5 mL at 05/24/15 1103  . pneumococcal 23 valent vaccine (PNU-IMMUNE) injection 0.5 mL  0.5  mL Intramuscular Prior to discharge Fritzi Mandes, MD      . polyethylene glycol (MIRALAX / GLYCOLAX) packet 17 g  17 g Oral Daily PRN Hillary Bow, MD      . sodium  chloride flush (NS) 0.9 % injection 3 mL  3 mL Intravenous Q12H Srikar Sudini, MD   3 mL at 05/24/15 1011  . thiamine (VITAMIN B-1) tablet 100 mg  100 mg Oral Daily Mikael Spray, NP   100 mg at 05/24/15 1005    Musculoskeletal: Strength & Muscle Tone: decreased Gait & Station: normal Patient leans: N/A  Psychiatric Specialty Exam: Review of Systems  Neurological: Positive for tremors.  Psychiatric/Behavioral: Positive for depression and substance abuse. The patient is nervous/anxious.     Blood pressure 122/86, pulse 97, temperature 98.4 F (36.9 C), temperature source Axillary, resp. rate 31, height _0  (1.88 m), weight 340 lb 13.3 oz (154.6 kg), SpO2 100 %.Body mass index is 43.74 kg/(m^2).  General Appearance: Casual  Eye Contact::  Fair  Speech:  Slow  Volume:  Decreased  Mood:  Anxious and Depressed  Affect:  Congruent and Depressed  Thought Process:  Coherent  Orientation:  Full (Time, Place, and Person)  Thought Content:  WDL  Suicidal Thoughts:  Yes.  with intent/plan  Homicidal Thoughts:  No  Memory:  Immediate;   Fair  Judgement:  Fair  Insight:  Lacking  Psychomotor Activity:  Psychomotor Retardation  Concentration:  Poor  Recall:  [employer] of Knowledge:Fair  Language: Fair  Akathisia:  No  Handed:  Right  AIMS (if indicated):     Assets:  Communication Skills Desire for Improvement Physical Health Social Support  ADL's:  Intact  Cognition: WNL  Sleep:      Treatment Plan Summary: Daily contact with patient to assess and evaluate symptoms and progress in treatment and Medication management  Disposition: Recommend psychiatric Inpatient admission when medically cleared.   Patient to be sent admitted to the psychiatric inpatient unit when a bed becomes available. Please call the unit  and reconsult with Dr. Weber Cooks on Monday as he do not have any beds available at this time Thank you for allowing me to participate in the care of this patient. Discussed with his mother at length about the treatment options and she is adamant about the patient going to the rehabilitation program and wants to be involved in his care. Her contact information is Alan Eaton [phone] I advised her that she needs to have  release of information and she agreed with the plan     This note was generated in part or whole with voice recognition software. Voice regonition is usually quite accurate but there are transcription errors that can and very often do occur. I apologize for any typographical errors that were not detected and corrected.    Rainey Pines, MD 05/24/2015 2:50 PM

## 2015-05-24 NOTE — Progress Notes (Signed)
Patients wife, significant other called stating that patient had called her and wanted to find out how to handle his court date that was coming up on March 29th 2017.  Conferred with sitter Grayland Ormond that patient requested to phone his mother and he allowed him to make a phone call, however confirmed with patients mother that patient did not call him instead called his significant other.  Explained to the patient that he has to be honest with [place] and he is not allowed to make phone calls at this time since he lied about whom he was calling.  Advised that we are attempting to protect him from the situation and we need him to be honest with [place].  He acknowledged with "Yes" for understanding.

## 2015-05-24 NOTE — Progress Notes (Signed)
Pt declined cpap

## 2015-05-24 NOTE — Progress Notes (Signed)
PULMONARY / CRITICAL CARE MEDICINE   Name: Alan Eaton MRN: [id number] DOB: 12/27/1975    ADMISSION DATE:  05/23/2015   CONSULTATION DATE:  05/23/2015  REFERRING MD:  Dr. Darvin Neighbours  CHIEF COMPLAINT:  Drug overdose  HISTORY OF PRESENT ILLNESS: This is a 40 year old white male with a past medical history of hypertension, morbid obesity, type 2 diabetes, alcohol abuse, and depression who presents with a potential drug overdose. History is obtained from ED records, as well as from patient. Per ED records, patient has been arguing with his wife. Patient has been arguing with his wife and decided to attempt suicide by taking about 31 pills including Ambien and Lortab and also drunk a lot of liquor. He has a history of previous suicide attempts.  His toxicology report was negative for acetaminophen, salicylates,but positive for benzodiazepine. At the ED, he dropped his oxygen saturation to the low 90s. He was placed on BiPAP and admitted to the ICU. CCM was consulted for further management. Patient is awake on BiPAP. He denies pain, chest pain, palpitations, nausea and vomiting. He is requesting to have the BiPAP mask removed.   PAST MEDICAL HISTORY :  He  has a past medical history of Gout; GERD (gastroesophageal reflux disease); Hypertension; Diabetes mellitus without complication ([place]); Depression; Alcohol abuse; Morbid obesity ([place]); and Suicide ideation.  PAST SURGICAL HISTORY: He  has past surgical history that includes none.  Allergies  Allergen Reactions  . Bee Venom Swelling  . Bactrim [Sulfamethoxazole-Trimethoprim] Swelling and Rash    No current facility-administered medications on file prior to encounter.   Current Outpatient Prescriptions on File Prior to Encounter  Medication Sig  . B Complex-C (B-COMPLEX WITH VITAMIN C) tablet Take 1 tablet by mouth daily.  . metFORMIN (GLUCOPHAGE) 500 MG tablet Take 1 tablet (500 mg total) by mouth daily with breakfast.  . Multiple  Vitamins-Minerals (MULTIVITAMIN WITH MINERALS) tablet Take 1 tablet by mouth daily.  . promethazine (PHENERGAN) 25 MG tablet Take 1 tablet (25 mg total) by mouth 2 (two) times daily.    FAMILY HISTORY:  His has no family status information on file.   SOCIAL HISTORY: He  reports that he has been smoking Cigarettes.  He has been smoking about 0.50 packs per day. He does not have any smokeless tobacco history on file. He reports that he drinks alcohol. He reports that he uses illicit drugs (Marijuana) about once per week.  REVIEW OF SYSTEMS:   All systems reviewed. Pertinent positives include mild dyspnea, depression, anxiety, and suicidal ideation. All other systems are negative   SUBJECTIVE:   VITAL SIGNS: BP 122/86 mmHg  Pulse 97  Temp(Src) 98.4 F (36.9 C) (Axillary)  Resp 31  Ht 6\' 2"  (1.88 m)  Wt 340 lb 13.3 oz (154.6 kg)  BMI 43.74 kg/m2  SpO2 100%  HEMODYNAMICS:    VENTILATOR SETTINGS: Vent Mode:  [-]  FiO2 (%):  [35 %] 35 %  INTAKE / OUTPUT: I/O last 3 completed shifts: In: 219.8 [I.V.:219.8] Out: 1150 [Urine:1150]  PHYSICAL EXAMINATION: General: Morbidly obese, in mild respiratory distress Neuro: Alert and oriented 3, moves all extremities, no focal deficits HEENT:  normocephalic and nontraumatic, sclera anicteric, PERRLA, oral mucosa dry Cardiovascular:  apical pulse palpable, tachycardic, S1, S2, no murmur, regurg and gallop. Lungs:  mild increase in work of breathing, bilateral airflow, no wheezing or rhonchi Abdomen: Obese, normal bowel sounds, no palpable organomegaly  Musculoskeletal:  positive range of motion in bilateral upper and lower extremities Skin:  warm and dry  Extremities: +2 pulses bilaterally, no edema  LABS:  BMET  Recent Labs Lab 05/23/15 0214 05/24/15 0213  NA 141 144  K 3.7 4.3  CL 106 110  CO2 28 29  BUN 10 18  CREATININE 1.22 1.33*  GLUCOSE 110* 111*    Electrolytes  Recent Labs Lab 05/23/15 0214 05/24/15 0213   CALCIUM 9.0 8.8*  MG  --  1.8  PHOS  --  3.0    CBC  Recent Labs Lab 05/23/15 0214 05/24/15 0213  WBC 12.0* 11.4*  HGB 14.5 12.3*  HCT 43.3 37.5*  PLT 254 208    Coag's No results for input(s): APTT, INR in the last 168 hours.  Sepsis Markers No results for input(s): LATICACIDVEN, PROCALCITON, O2SATVEN in the last 168 hours.  ABG  Recent Labs Lab 05/23/15 1535  PHART 7.35  PCO2ART 48  PO2ART 96    Liver Enzymes  Recent Labs Lab 05/23/15 0214  AST 49*  ALT 68*  ALKPHOS 52  BILITOT 0.2*  ALBUMIN 3.9    Cardiac Enzymes  Recent Labs Lab 05/23/15 1347 05/23/15 2041 05/24/15 0213  TROPONINI <0.03 <0.03 <0.03    Glucose  Recent Labs Lab 05/23/15 1526 05/23/15 2010 05/23/15 2334 05/24/15 0338 05/24/15 0722 05/24/15 1143  GLUCAP 81 101* 107* 113* 112* 112*    Imaging Ct Angio Chest Pe W/cm &/or Wo Cm  05/23/2015  CLINICAL DATA:  Shortness of breath EXAM: CT ANGIOGRAPHY CHEST WITH CONTRAST TECHNIQUE: Multidetector CT imaging of the chest was performed using the standard protocol during bolus administration of intravenous contrast. Multiplanar CT image reconstructions and MIPs were obtained to evaluate the vascular anatomy. CONTRAST:  113mL OMNIPAQUE IOHEXOL 350 MG/ML SOLN COMPARISON:  05/23/2015 FINDINGS: Mediastinum/Nodes: Thoracic inlet is normal. No significant hilar or mediastinal adenopathy. No significant pericardial effusion. Thoracic aorta shows no dissection or dilatation. There are no filling defects appreciated in the pulmonary arterial system. However, evaluation lower lobe third order branches is significantly limited by respiratory motion artifact. New motion artifact also compromises evaluation of upper lobe branches. Lungs/Pleura: No pleural effusion. No evidence of infiltrate or consolidation. Evaluation of upper and lower lobe parenchyma limited by motion artifact. Upper abdomen: Hepatic steatosis.  No acute findings. 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 No acute abnormalities. Review of the MIP images confirms the above findings. IMPRESSION: Although no pulmonary emboli are detected, the study is moderately limited by respiratory motion which could obscure small peripheral emboli. Electronically Signed   By: Skipper Cliche M.D.   On: 05/23/2015 18:37     STUDIES:  05/23/2015: CT chest PE study pending   CULTURES: 05-23-2015 MRSA screen negative  ANTIBIOTICS: 05/23/2015. Zosyn.  SIGNIFICANT EVENTS: 05/23/2015: OD>ED>Acute respiratory failure>BiPAP>ICU  LINES/TUBES: PIVs  DISCUSSION: His is a 40 year old male presenting with acute respiratory failure secondary to overdose, obesity hypoventilation syndrome, and suicide attempt by overdose. He remains tachycardic and saturating okay on BiPAP.  ASSESSMENT / PLAN:  PULMONARY A: Acute hypoxic respiratory failure secondary to overdose Obesity hypoventilation syndrome ?Apsiration pneumonia R/O PE P:   -Continuous BiPAP and at HS as tolerated and titrated to nasal cannula.  -Titrate BiPAP and supplemental oxygen to keep FiO2 88-94%. - Minimize sedatives -CTA chest PE study -D-dimer level -Nebulized bronchodilators  CARDIOVASCULAR A:  HISTORY OF HYPERTENSION  MORBID OBESITY P: -Hemodynamic monitoring per ICU protocol -Start metoprolol 25 mg bid with holding parameters  RENAL A:   No acute issues P:   -Monitor I/O -Monitor  and correct electrolytes -IV fluids  GASTROINTESTINAL A:   H/o GERD P:   -Home dose of PPI  HEMATOLOGIC A:   No acute issues P:  -Lovenox for VTE prevention  INFECTIOUS A:   Possible aspiration pneumonitis P:   -Empiric antibiotics as above -Pan culture of febrile  ENDOCRINE A:   Type 2 DM P:   -Blood glucose monitoring with SSI coverage  NEUROLOGIC A:   Drug overdose-unclear what substances patient took; tox screen unremarkable except for benzodiazepine.  ETOH  abuse Acute metabolic encephalopathy 2/2 OD P:   RASS goal: 0 to -1 -Precedex gtt and CIWA monitoring -Neuro checks -IV Fluids   Psychiatric A Suicide attempt P -Sitter at bedside -Psychiatric consult  FAMILY  - Updates: No family at bedside  - Inter-disciplinary family meet or Palliative Care meeting due prior to discharge   Total CCM time 45 minutes  Ariyan Sinnett [facility] ANP-BC Pulmonary and [place] Pager [phone]  05/24/2015, 12:09 PM  Pt seen and examined with NP, agree with assessment and plan. Acute respiratory failure likely due to suicide attempt- benzo OD Lorrin Mais), now appears to be improving. Will check D-Dimer to rule out PE.  Continue bipap qhs and prn.   -Marda Stalker, M.D.   Critical Care Attestation.  I have personally obtained a history, examined the patient, evaluated laboratory and imaging results, formulated the assessment and plan and placed orders. The Patient requires high complexity decision making for assessment and support, frequent evaluation and titration of therapies, application of advanced monitoring technologies and extensive interpretation of multiple databases. The patient has critical illness that could lead imminently to failure of 1 or more organ systems and requires the highest level of physician preparedness to intervene.  Critical Care Time devoted to patient care services described in this note is 35 minutes and is exclusive of time spent in procedures.

## 2015-05-24 NOTE — Progress Notes (Signed)
Patient transferred from ICU to Room 159.  Patient quiet and does not communicate much in regards to why was admitted to the hospital.  Patient had bag with clothes, cell phone and pills when he transferred to unit.  Inventoried the medications and sent to pharmacy to hold until discharge.  Patient mother [name] arrived to see patient and to bring clothes and soap.  Confirmed all items safe prior to entry into room.  Patient's mother interjects a lot while attempting to have conversation with son.  Safety sitter present in room with patient.  Advised mother that patient only allowed to have short visits only with individuals who will not further exacerbates the situation, mother states that she understands.

## 2015-05-25 LAB — GLUCOSE, CAPILLARY
GLUCOSE-CAPILLARY: 116 mg/dL — AB (ref 65–99)
Glucose-Capillary: 108 mg/dL — ABNORMAL HIGH (ref 65–99)
Glucose-Capillary: 100 mg/dL — ABNORMAL HIGH (ref 65–99)
Glucose-Capillary: 109 mg/dL — ABNORMAL HIGH (ref 65–99)

## 2015-05-25 MED ORDER — RENA-VITE PO TABS
1.0000 | ORAL_TABLET | Freq: Every day | ORAL | Status: DC
  Administered 2015-05-25 – 2015-05-26 (×2): 1 via ORAL
  Filled 2015-05-25: qty 1

## 2015-05-25 NOTE — Care Management Important Message (Signed)
Important Message  Patient Details  Name: Alan Eaton MRN: [id number] Date of Birth: 1975/05/19   Medicare Important Message Given:  Yes    Kyri Shader A, RN 05/25/2015, 4:35 PM

## 2015-05-25 NOTE — Progress Notes (Signed)
Pt took nicotine patch off early in shift, stating he did not need it. Pt continues to c/o left elbow pain. Medicated with tylenol 650mg  at 23:15, and ace wrap reapplied. No further c/o discomfort voiced. Cont with 1:1 sitter.

## 2015-05-25 NOTE — Progress Notes (Signed)
Patient ID: Alan Eaton, male   DOB: Sep 14, 1975, 40 y.o.   MRN: [id number] [place] [facility] [place] NAME: Alan Eaton    MR#:  [id number]  DATE OF BIRTH:  [birth year]   came in with overdose on Lortab with suicidal ideation after argument with wife.  Doing well. Wants to go home. Eating well Sitter+ No suicidal ideation  REVIEW OF SYSTEMS:   Review of Systems  Constitutional: Negative for fever, chills and weight loss.  HENT: Negative for ear discharge, ear pain and nosebleeds.   Eyes: Negative for blurred vision, pain and discharge.  Respiratory: Negative for sputum production, shortness of breath, wheezing and stridor.   Cardiovascular: Negative for chest pain, palpitations, orthopnea and PND.  Gastrointestinal: Negative for nausea, vomiting, abdominal pain and diarrhea.  Genitourinary: Negative for urgency and frequency.  Musculoskeletal: Negative for back pain and joint pain.  Neurological: Positive for weakness. Negative for sensory change, speech change and focal weakness.  Psychiatric/Behavioral: Negative for depression and hallucinations. The patient is not nervous/anxious.   All other systems reviewed and are negative.  Tolerating Diet: yes  Tolerating PT:  Not needed  DRUG ALLERGIES:   Allergies  Allergen Reactions  . Bee Venom Swelling  . Bactrim [Sulfamethoxazole-Trimethoprim] Swelling and Rash    VITALS:  Blood pressure 165/93, pulse 105, temperature 97.7 F (36.5 C), temperature source Oral, resp. rate 20, height 6\' 2"  (1.88 m), weight 154.042 kg (339 lb 9.6 oz), SpO2 92 %.  PHYSICAL EXAMINATION:   Physical Exam  GENERAL:  40 y.o.-year-old patient lying in the bed with no acute distress.  morbidly obese  EYES: Pupils equal, round, reactive to light and accommodation. No scleral icterus. Extraocular muscles intact.  HEENT: Head atraumatic, normocephalic. Oropharynx and nasopharynx clear.  NECK:  Supple, no  jugular venous distention. No thyroid enlargement, no tenderness. distantath sounds bilaterally, no wheezing, rales, rhonchi. No use of accessory muscles of respiration.  CARDIOVASCULAR: S1, S2 normal. No murmurs, rubs, or gallops.  ABDOMEN: Soft, nontender, nondistended. Bowel sounds present. No organomegaly or mass.  EXTREMITIES: No cyanosis, clubbing or edema b/l.    NEUROLOGIC: Cranial nerves II through XII are intact. No focal Motor or sensory deficits b/l.   PSYCHIATRIC:  patient is alert and oriented x 3.  SKIN: No obvious rash, lesion, or ulcer.   LABORATORY PANEL:  CBC  Recent Labs Lab 05/24/15 0213  WBC 11.4*  HGB 12.3*  HCT 37.5*  PLT 208    Chemistries   Recent Labs Lab 05/23/15 0214 05/24/15 0213  NA 141 144  K 3.7 4.3  CL 106 110  CO2 28 29  GLUCOSE 110* 111*  BUN 10 18  CREATININE 1.22 1.33*  CALCIUM 9.0 8.8*  MG  --  1.8  AST 49*  --   ALT 68*  --   ALKPHOS 52  --   BILITOT 0.2*  --    Cardiac Enzymes  Recent Labs Lab 05/24/15 0213  TROPONINI <0.03   RADIOLOGY:  Ct Angio Chest Pe W/cm &/or Wo Cm  05/23/2015  CLINICAL DATA:  Shortness of breath EXAM: CT ANGIOGRAPHY CHEST WITH CONTRAST TECHNIQUE: Multidetector CT imaging of the chest was performed using the standard protocol during bolus administration of intravenous contrast. Multiplanar CT image reconstructions and MIPs were obtained to evaluate the vascular anatomy. CONTRAST:  148mL OMNIPAQUE IOHEXOL 350 MG/ML SOLN COMPARISON:  05/23/2015 FINDINGS: Mediastinum/Nodes: Thoracic inlet is normal. No significant hilar or mediastinal adenopathy. No significant pericardial  effusion. Thoracic aorta shows no dissection or dilatation. There are no filling defects appreciated in the pulmonary arterial system. However, evaluation lower lobe third order branches is significantly limited by respiratory motion artifact. New motion artifact also compromises evaluation of upper lobe branches. Lungs/Pleura: No pleural  effusion. No evidence of infiltrate or consolidation. Evaluation of upper and lower lobe parenchyma limited by motion artifact. Upper abdomen: Hepatic steatosis.  No acute findings. 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 No acute abnormalities. Review of the MIP images confirms the above findings. IMPRESSION: Although no pulmonary emboli are detected, the study is moderately limited by respiratory motion which could obscure small peripheral emboli. Electronically Signed   By: Alan Eaton M.D.   On: 05/23/2015 18:37   ASSESSMENT AND PLAN:  Alan Eaton is a 40 y.o. male with a known history of diabetes, hypertension, morbid obesity, depression with prior suicide attempts and alcohol abuse present. To the emergency room after overdose on Lortab and Ambien. Patient has an IVC in place. He was noticed to be tachycardic and tachypneic  * Acute encephalopathy due to overdose on Ambien and Lortab IVC in place.  -Consult psychiatry noted. Recommends inpt beh med admit.will have to D/w dr clapacs tomorrow patient presented awake alert oriented 3.   * Acute respiratory failure -Now off BiPAP.on RA Tachycardia and tachypnea-improved   * Diabetes mellitus Sliding scale insulin Resume home meds   * Hypertension  Resume home meds  * DVT prophylaxis with Lovenox  *Morbid obesity with obesity hypoventilation syndrome.   patient reports not having CPAP at home. He has sleep apnea. We'll have CM look into this.   Case discussed with Care Management/Social Worker. Management plans discussed with the patient, family and they are in agreement.  CODE STATUS: full  DVT Prophylaxis: lovnoex  TOTAL TIME TAKING CARE OF THIS PATIENT: 35 minutes.  >50% time spent on counselling and coordination of care  POSSIBLE D/C IN 1 DAYS, DEPENDING ON CLINICAL CONDITION.  Note: This dictation was prepared with Dragon dictation along with smaller phrase technology. Any  transcriptional errors that result from this process are unintentional.  Gavino Fouch M.D on 05/25/2015 at 12:33 PM  Between 7am to 6pm - Pager - [phone]  After 6pm go to [email or web address] - password [email or web address] [facility]  Office  [phone]  CC: Primary care physician; Dicky Doe, MD

## 2015-05-26 DIAGNOSIS — Z818 Family history of other mental and behavioral disorders: Secondary | ICD-10-CM | POA: Diagnosis not present

## 2015-05-26 DIAGNOSIS — I10 Essential (primary) hypertension: Secondary | ICD-10-CM | POA: Diagnosis not present

## 2015-05-26 DIAGNOSIS — Z833 Family history of diabetes mellitus: Secondary | ICD-10-CM | POA: Diagnosis not present

## 2015-05-26 DIAGNOSIS — T426X1A Poisoning by other antiepileptic and sedative-hypnotic drugs, accidental (unintentional), initial encounter: Secondary | ICD-10-CM | POA: Diagnosis not present

## 2015-05-26 DIAGNOSIS — F329 Major depressive disorder, single episode, unspecified: Secondary | ICD-10-CM | POA: Diagnosis not present

## 2015-05-26 DIAGNOSIS — Z79899 Other long term (current) drug therapy: Secondary | ICD-10-CM | POA: Diagnosis not present

## 2015-05-26 DIAGNOSIS — Z91030 Bee allergy status: Secondary | ICD-10-CM | POA: Diagnosis not present

## 2015-05-26 DIAGNOSIS — G4730 Sleep apnea, unspecified: Secondary | ICD-10-CM | POA: Diagnosis not present

## 2015-05-26 DIAGNOSIS — E662 Morbid (severe) obesity with alveolar hypoventilation: Secondary | ICD-10-CM | POA: Diagnosis not present

## 2015-05-26 DIAGNOSIS — Z7982 Long term (current) use of aspirin: Secondary | ICD-10-CM | POA: Diagnosis not present

## 2015-05-26 DIAGNOSIS — F1290 Cannabis use, unspecified, uncomplicated: Secondary | ICD-10-CM | POA: Diagnosis not present

## 2015-05-26 DIAGNOSIS — Z8249 Family history of ischemic heart disease and other diseases of the circulatory system: Secondary | ICD-10-CM | POA: Diagnosis not present

## 2015-05-26 DIAGNOSIS — F1010 Alcohol abuse, uncomplicated: Secondary | ICD-10-CM | POA: Diagnosis not present

## 2015-05-26 DIAGNOSIS — K219 Gastro-esophageal reflux disease without esophagitis: Secondary | ICD-10-CM | POA: Diagnosis not present

## 2015-05-26 DIAGNOSIS — R000 Tachycardia, unspecified: Secondary | ICD-10-CM | POA: Diagnosis not present

## 2015-05-26 DIAGNOSIS — J9601 Acute respiratory failure with hypoxia: Secondary | ICD-10-CM | POA: Diagnosis not present

## 2015-05-26 DIAGNOSIS — G92 Toxic encephalopathy: Secondary | ICD-10-CM | POA: Diagnosis not present

## 2015-05-26 DIAGNOSIS — F332 Major depressive disorder, recurrent severe without psychotic features: Secondary | ICD-10-CM | POA: Diagnosis not present

## 2015-05-26 DIAGNOSIS — F17210 Nicotine dependence, cigarettes, uncomplicated: Secondary | ICD-10-CM | POA: Diagnosis not present

## 2015-05-26 DIAGNOSIS — Z23 Encounter for immunization: Secondary | ICD-10-CM | POA: Diagnosis not present

## 2015-05-26 DIAGNOSIS — R0682 Tachypnea, not elsewhere classified: Secondary | ICD-10-CM | POA: Diagnosis not present

## 2015-05-26 DIAGNOSIS — Z888 Allergy status to other drugs, medicaments and biological substances status: Secondary | ICD-10-CM | POA: Diagnosis not present

## 2015-05-26 DIAGNOSIS — Z915 Personal history of self-harm: Secondary | ICD-10-CM | POA: Diagnosis not present

## 2015-05-26 DIAGNOSIS — E119 Type 2 diabetes mellitus without complications: Secondary | ICD-10-CM | POA: Diagnosis not present

## 2015-05-26 DIAGNOSIS — F1994 Other psychoactive substance use, unspecified with psychoactive substance-induced mood disorder: Secondary | ICD-10-CM

## 2015-05-26 DIAGNOSIS — M109 Gout, unspecified: Secondary | ICD-10-CM | POA: Diagnosis not present

## 2015-05-26 LAB — GLUCOSE, CAPILLARY
GLUCOSE-CAPILLARY: 113 mg/dL — AB (ref 65–99)
Glucose-Capillary: 104 mg/dL — ABNORMAL HIGH (ref 65–99)

## 2015-05-26 MED ORDER — INFLUENZA VAC SPLIT QUAD 0.5 ML IM SUSY
0.5000 mL | PREFILLED_SYRINGE | INTRAMUSCULAR | Status: AC
  Administered 2015-05-26: 0.5 mL via INTRAMUSCULAR
  Filled 2015-05-26: qty 0.5

## 2015-05-26 MED ORDER — RENA-VITE PO TABS: 1.0000 | ORAL_TABLET | Freq: Every day | ORAL | Status: DC

## 2015-05-26 MED ORDER — FOLIC ACID 1 MG PO TABS: 1.0000 mg | ORAL_TABLET | Freq: Every day | ORAL | Status: DC

## 2015-05-26 MED ORDER — THIAMINE HCL 50 MG PO TABS: 50.0000 mg | ORAL_TABLET | Freq: Every day | ORAL | Status: DC

## 2015-05-26 MED ORDER — INFLUENZA VAC SPLIT QUAD 0.5 ML IM SUSY: 0.5000 mL | PREFILLED_SYRINGE | INTRAMUSCULAR | Status: DC

## 2015-05-26 NOTE — Consult Note (Signed)
[facility] Face-to-Face Psychiatry Consult   Reason for Consult:  Consult for this 40 year old man who came to the hospital on Friday the third after taking an overdose of Ambien and possibly other medicine on top of alcohol abuse. Patient has been on the medical service over the weekend. Reevaluate for disposition. Referring Physician:  Posey Pronto Patient Identification: Alan Eaton MRN:  [id number] Principal Diagnosis: Substance induced mood disorder [facility]) Diagnosis:   Patient Active Problem List   Diagnosis Date Noted  . Overdose of sedative or hypnotic [T42.71XA] 05/26/2015  . Alcohol abuse [F10.10] 05/26/2015  . Substance induced mood disorder ([place]) [F19.94] 05/26/2015  . Severe episode of recurrent major depressive disorder, without psychotic features ([place]) [F33.2]   . Alcohol dependence with intoxication with complication ([place]) 0000000   . Overdose [T50.901A] 05/23/2015  . Sepsis ([place]) [A41.9] 01/23/2015    Total Time spent with patient: 1 hour  Subjective:   Alan Eaton is a 39 y.o. male patient admitted with "I had an argument and took some pills".  HPI:  Patient interviewed. Also reviewed chart including initial evaluation in the emergency room and subsequent psychiatric evaluations. Also reviewed older treatments in the hospital including an inpatient psychiatric treatment. Also reviewed labs and vitals. 40 year old man came to the emergency room on Friday. It was reported at the time that he had taken an overdose with a combination of Ambien and Lortab. On interview today the patient denies Lortab saying that all he remembers taking was the Ambien. He says that when he took them he had been in an argument with his girl friend and told her that he was tired of living. On the other hand he immediately told her about the overdose and asked her to call 911. He was cooperative with treatment when he came to the emergency room. Patient tells me that he had no actual wish to die. He says he  was just frustrated because he was having an argument and was intoxicated. He says he had had probably 2 of the 40 ounce size beers. He denies that he had been abusing other drugs. Patient says that when he was not drinking his mood had been fine. He had not been aware of any depression. Had not had suicidal thoughts. Doesn't feel hopeless. He articulates multiple things in his life that are positive that he wants to live for. Denies any hallucinations or psychotic symptoms. He claims that he only drinks a couple of times a month when he gets his check. Denies that he is abusing other drugs. He is not currently getting any kind of psychiatric treatment and is not engaged in any treatment for alcohol abuse. He says that a lot of his stress comes from arguments with his girlfriend and most of the arguments are about his drinking. Since being in the hospital he has had a sitter. Has not acted in a way to suggest hopelessness or thoughts of self-harm.  Medical history: Patient is morbidly obese which has been a chronic problem. Evidently bariatric surgery was considered in the past but he was never deemed an appropriate candidate or never followed up with that. Patient has either diabetes or just some degree of sugar dysregulation from his weight. Chronic gastric reflux. No other active medical problems that he reports.  Social history: Lives with his girlfriend and her son who is 73 years old. He says ever since the son moved in there is been a lot more stress at home. Patient does not work he gets disability. Doesn't  sound like he does very much during the day. Has a good relationship with his mother who appears to be quite concerned about him.  Substance abuse history: Patient has had several prior situations similar to this one in which alcohol played a role in overdose or threatening overdose. His insight into this is poor. No history however of delirium tremens or seizures. Doesn't sound like he is ever  really engaged in substance abuse treatment.  Past Psychiatric History: Patient has a history of behavior problems that seem to be almost exclusively related to intoxication. As far as I can tell he has not been prescribed any medicine for depression or other psychiatric medicine. He denies that he is ever seriously wanted to kill himself. No history of psychosis or mania. Never did engage with any kind of mental health treatment after his last hospital stay.  Risk to Self: Suicidal Ideation: Yes-Currently Present Suicidal Intent: Yes-Currently Present Is patient at risk for suicide?: Yes Suicidal Plan?: Yes-Currently Present Specify Current Suicidal Plan: Pt reports taking an overdose of prescription medications. Access to Means: Yes Specify Access to Suicidal Means: Pt has access to pills What has been your use of drugs/alcohol within the last 12 months?: Beer How many times?: 0 Other Self Harm Risks: None identified Triggers for Past Attempts: Other (Comment) Intentional Self Injurious Behavior: None Risk to Others: Homicidal Ideation: No Thoughts of Harm to Others: No Current Homicidal Intent: No Current Homicidal Plan: No Access to Homicidal Means: No Identified Victim: None identified History of harm to others?: No Assessment of Violence: None Noted Violent Behavior Description: N/A Does patient have access to weapons?: No Criminal Charges Pending?: Yes Describe Pending Criminal Charges: DWI Does patient have a court date: Yes Court Date: 06/18/15 Prior Inpatient Therapy: Prior Inpatient Therapy: No Prior Therapy Dates: N/A Prior Therapy Facilty/Provider(s): N/A Reason for Treatment: N/A Prior Outpatient Therapy: Prior Outpatient Therapy: No Prior Therapy Dates: N/A Prior Therapy Facilty/Provider(s): N/A Reason for Treatment: N/A Does patient have an ACCT team?: No Does patient have Intensive In-House Services?  : No Does patient have [facility] services? : No Does patient  have P4CC services?: No  Past Medical History:  Past Medical History  Diagnosis Date  . Gout   . GERD (gastroesophageal reflux disease)   . Hypertension   . Diabetes mellitus without complication ([place])   . Depression   . Alcohol abuse   . Morbid obesity ([place])   . Suicide ideation     Past Surgical History  Procedure Laterality Date  . None     Family History:  Family History  Problem Relation Age of Onset  . Hypertension    . Diabetes Mellitus II     Family Psychiatric  History: Patient denies any family history of mental illness denies any family history of substance abuse problems or suicide Social History:  History  Alcohol Use  . 0.0 oz/week  . 0 Standard drinks or equivalent per week    Comment: occasional     History  Drug Use  . 1.00 per week  . Special: Marijuana    Social History   Social History  . Marital Status: Divorced    Spouse Name: N/A  . Number of Children: N/A  . Years of Education: N/A   Social History Main Topics  . Smoking status: Current Every Day Smoker -- 0.50 packs/day    Types: Cigarettes  . Smokeless tobacco: None  . Alcohol Use: 0.0 oz/week    0 Standard drinks or  equivalent per week     Comment: occasional  . Drug Use: 1.00 per week    Special: Marijuana  . Sexual Activity: Not Asked   Other Topics Concern  . None   Social History Narrative   Additional Social History:    Allergies:   Allergies  Allergen Reactions  . Bee Venom Swelling  . Bactrim [Sulfamethoxazole-Trimethoprim] Swelling and Rash    Labs:  Results for orders placed or performed during the hospital encounter of 05/23/15 (from the past 48 hour(s))  Glucose, capillary     Status: Abnormal   Collection Time: 05/24/15 11:43 AM  Result Value Ref Range   Glucose-Capillary 112 (H) 65 - 99 mg/dL   Comment 1 Notify RN   Glucose, capillary     Status: Abnormal   Collection Time: 05/24/15  3:54 PM  Result Value Ref Range   Glucose-Capillary 108 (H) 65 -  99 mg/dL   Comment 1 Notify RN   Glucose, capillary     Status: None   Collection Time: 05/24/15  8:56 PM  Result Value Ref Range   Glucose-Capillary 95 65 - 99 mg/dL  Glucose, capillary     Status: Abnormal   Collection Time: 05/25/15  8:28 AM  Result Value Ref Range   Glucose-Capillary 108 (H) 65 - 99 mg/dL   Comment 1 Notify RN   Glucose, capillary     Status: Abnormal   Collection Time: 05/25/15 11:36 AM  Result Value Ref Range   Glucose-Capillary 116 (H) 65 - 99 mg/dL   Comment 1 Notify RN   Glucose, capillary     Status: Abnormal   Collection Time: 05/25/15  3:44 PM  Result Value Ref Range   Glucose-Capillary 100 (H) 65 - 99 mg/dL   Comment 1 Notify RN   Glucose, capillary     Status: Abnormal   Collection Time: 05/25/15  8:44 PM  Result Value Ref Range   Glucose-Capillary 109 (H) 65 - 99 mg/dL  Glucose, capillary     Status: Abnormal   Collection Time: 05/26/15  7:45 AM  Result Value Ref Range   Glucose-Capillary 104 (H) 65 - 99 mg/dL   Comment 1 Notify RN     Current Facility-Administered Medications  Medication Dose Route Frequency Provider Last Rate Last Dose  . acetaminophen (TYLENOL) tablet 650 mg  650 mg Oral Q6H PRN Hillary Bow, MD   650 mg at 05/24/15 2318   Or  . acetaminophen (TYLENOL) suppository 650 mg  650 mg Rectal Q6H PRN Srikar Sudini, MD      . allopurinol (ZYLOPRIM) tablet 200 mg  200 mg Oral Daily Fritzi Mandes, MD   200 mg at 05/26/15 0955  . antiseptic oral rinse (CPC / CETYLPYRIDINIUM CHLORIDE 0.05%) solution 7 mL  7 mL Mouth Rinse BID Fritzi Mandes, MD   7 mL at 05/25/15 2052  . aspirin EC tablet 81 mg  81 mg Oral Daily Fritzi Mandes, MD   81 mg at 05/26/15 0955  . colchicine tablet 0.6 mg  0.6 mg Oral Daily Fritzi Mandes, MD   0.6 mg at 05/26/15 0955  . enoxaparin (LOVENOX) injection 40 mg  40 mg Subcutaneous Q12H Srikar Sudini, MD   40 mg at 05/26/15 0016  . famotidine (PEPCID) tablet 40 mg  40 mg Oral QHS Fritzi Mandes, MD   40 mg at 05/25/15 2051  .  folic acid (FOLVITE) tablet 1 mg  1 mg Oral Daily Mikael Spray, NP   1 mg at  05/26/15 0955  . guaiFENesin-dextromethorphan (ROBITUSSIN DM) 100-10 MG/5ML syrup 10 mL  10 mL Oral Q6H PRN Fritzi Mandes, MD   10 mL at 05/26/15 0955  . insulin aspart (novoLOG) injection 0-5 Units  0-5 Units Subcutaneous QHS Fritzi Mandes, MD   0 Units at 05/24/15 2348  . insulin aspart (novoLOG) injection 0-9 Units  0-9 Units Subcutaneous TID WC Fritzi Mandes, MD   0 Units at 05/24/15 1147  . ipratropium-albuterol (DUONEB) 0.5-2.5 (3) MG/3ML nebulizer solution 3 mL  3 mL Nebulization Q4H PRN Srikar Sudini, MD      . labetalol (NORMODYNE,TRANDATE) injection 10 mg  10 mg Intravenous Q4H PRN Srikar Sudini, MD      . menthol-cetylpyridinium (CEPACOL) lozenge 3 mg  1 lozenge Oral PRN Fritzi Mandes, MD   3 mg at 05/25/15 1415  . metFORMIN (GLUCOPHAGE) tablet 500 mg  500 mg Oral Q breakfast Fritzi Mandes, MD   500 mg at 05/26/15 0956  . metoprolol tartrate (LOPRESSOR) tablet 25 mg  25 mg Oral BID Mikael Spray, NP   25 mg at 05/26/15 0956  . morphine 2 MG/ML injection 2 mg  2 mg Intravenous Q4H PRN Hillary Bow, MD   2 mg at 05/23/15 1900  . multivitamin (RENA-VIT) tablet 1 tablet  1 tablet Oral Daily Fritzi Mandes, MD   1 tablet at 05/26/15 0956  . multivitamin with minerals tablet 1 tablet  1 tablet Oral Daily Mikael Spray, NP   1 tablet at 05/26/15 0955  . nicotine (NICODERM CQ - dosed in mg/24 hours) patch 21 mg  21 mg Transdermal Daily Mikael Spray, NP   21 mg at 05/24/15 1415  . pantoprazole (PROTONIX) EC tablet 40 mg  40 mg Oral Daily Fritzi Mandes, MD   40 mg at 05/26/15 0956  . pantoprazole (PROTONIX) injection 40 mg  40 mg Intravenous Q24H Laverle Hobby, MD   40 mg at 05/25/15 1703  . pneumococcal 23 valent vaccine (PNU-IMMUNE) injection 0.5 mL  0.5 mL Intramuscular Tomorrow-1000 Srikar Sudini, MD   0.5 mL at 05/24/15 1103  . pneumococcal 23 valent vaccine (PNU-IMMUNE) injection 0.5 mL  0.5 mL Intramuscular  Prior to discharge Fritzi Mandes, MD      . polyethylene glycol (MIRALAX / GLYCOLAX) packet 17 g  17 g Oral Daily PRN Srikar Sudini, MD      . sodium chloride flush (NS) 0.9 % injection 3 mL  3 mL Intravenous Q12H Hillary Bow, MD   3 mL at 05/25/15 2051  . thiamine (VITAMIN B-1) tablet 100 mg  100 mg Oral Daily Mikael Spray, NP   100 mg at 05/26/15 [id number]    Musculoskeletal: Strength & Muscle Tone: within normal limits Gait & Station: normal Patient leans: N/A  Psychiatric Specialty Exam: Review of Systems  Constitutional: Negative.   HENT: Negative.   Eyes: Negative.   Respiratory: Negative.   Cardiovascular: Negative.   Gastrointestinal: Positive for heartburn.  Musculoskeletal: Negative.   Skin: Negative.   Neurological: Negative.   Psychiatric/Behavioral: Positive for substance abuse. Negative for depression, suicidal ideas, hallucinations and memory loss. The patient is not nervous/anxious and does not have insomnia.     Blood pressure 148/80, pulse 82, temperature 99.1 F (37.3 C), temperature source Oral, resp. rate 18, height 6\' 2"  (1.88 m), weight 151.093 kg (333 lb 1.6 oz), SpO2 97 %.Body mass index is 42.75 kg/(m^2).  General Appearance: Disheveled  Eye Contact::  Fair  Speech:  Slow  Volume:  Decreased  Mood:  Euthymic  Affect:  Congruent  Thought Process:  Goal Directed  Orientation:  Full (Time, Place, and Person)  Thought Content:  Negative  Suicidal Thoughts:  No  Homicidal Thoughts:  No  Memory:  Immediate;   Good Recent;   Fair Remote;   Fair  Judgement:  Fair  Insight:  Fair  Psychomotor Activity:  Decreased  Concentration:  Fair  Recall:  [employer] of Knowledge:Fair  Language: Fair  Akathisia:  No  Handed:  Right  AIMS (if indicated):     Assets:  Agricultural consultant Housing Resilience Social Support  ADL's:  Intact  Cognition: WNL  Sleep:      Treatment Plan Summary: Plan 40 year old man status post an  overdose that happened while intoxicated. Alcohol level was elevated on admission. He was positive for benzodiazepines but notably was negative for opiates. On the other hand hydrocodone often does not show up on the drug screen. Therefore it's unclear to me whether he overdosed on any opiates or not. It doesn't appear that he has been prescribed any opiates other than some tramadol this past month but he is also not prescribe the Ambien. In any case at this point he denies any suicidal thoughts completely. He is able to articulate positive things in his life and reasons to live. He does not give a history of symptoms of major depression. My conclusion would be the diagnosis would be substance induced mood disorder as the primary diagnosis with alcohol dependence and abuse as well. Patient at this point has resolved the worst of his mood symptoms for the time being. There is little likelihood that he would benefit from inpatient treatment as what he mostly needs is to work on deciding he wants to stop drinking and engaging in substance abuse treatment. Patient does not appear to any longer meet commitment criteria as he does not appear to be an acute danger to himself. I am going to discontinue the IVC and recommend that he may be discharged at the discretion of the hospitalist. At the same time he needs to be given referral information to [facility] at discharge and strongly encouraged to follow-up with outpatient substance abuse treatment. Education done with him about the importance of getting help to stop drinking and the dangers of continued drinking. He agrees to consider the plan. No medication ordered. Discontinued IVC.  Disposition: Patient does not meet criteria for psychiatric inpatient admission. Supportive therapy provided about ongoing stressors.  Alethia Berthold, MD 05/26/2015 11:38 AM

## 2015-05-26 NOTE — Discharge Summary (Signed)
[facility] [place] NAME: Alan Eaton    MR#:  [id number]  DATE OF BIRTH:  1975-10-13  DATE OF ADMISSION:  05/23/2015 ADMITTING PHYSICIAN: Hillary Bow, MD  DATE OF DISCHARGE: 05/26/15  PRIMARY CARE PHYSICIAN: Dicky Doe, MD    ADMISSION DIAGNOSIS:  Respiratory distress [R06.00] SOB (shortness of breath) [R06.02] Overdose, intentional self-harm, initial encounter ([place]) [T50.902A] Severe episode of recurrent major depressive disorder, without psychotic features ([place]) [F33.2]  DISCHARGE DIAGNOSIS:  Substance induced Mood disorder DM-2 HTn obesity  SECONDARY DIAGNOSIS:   Past Medical History  Diagnosis Date  . Gout   . GERD (gastroesophageal reflux disease)   . Hypertension   . Diabetes mellitus without complication ([place])   . Depression   . Alcohol abuse   . Morbid obesity ([place])   . Suicide ideation     HOSPITAL COURSE:   Alan Eaton is a 40 y.o. male with a known history of diabetes, hypertension, morbid obesity, depression with prior suicide attempts and alcohol abuse present. Patient has an IVC in place. He was noticed to be tachycardic and tachypneic  * Acute encephalopathy due to questionable  overdose on Ambien vs substance induced mood disorder IVC in place.  -Consult psychiatry noted. Dr clapacs input noted. Ok from his standpoint for d/c home patient presented awake alert oriented 3.   * Acute respiratory failure -Now off BiPAP.on RA Tachycardia and tachypnea-improved   * Diabetes mellitus Sliding scale insulin Resume home meds   * Hypertension  Resume home meds  * DVT prophylaxis with Lovenox  *Morbid obesity with obesity hypoventilation syndrome.  patient reports not having CPAP at home. He has sleep apnea. We'll have CM look into this.  Pt ok to go home. His IVC has been d/ced by dr clapacs  CONSULTS OBTAINED:  Treatment Team:  Rainey Pines, MD Gonzella Lex, MD  DRUG  ALLERGIES:   Allergies  Allergen Reactions  . Bee Venom Swelling  . Bactrim [Sulfamethoxazole-Trimethoprim] Swelling and Rash    DISCHARGE MEDICATIONS:   Current Discharge Medication List    START taking these medications   Details  folic acid (FOLVITE) 1 MG tablet Take 1 tablet (1 mg total) by mouth daily. Qty: 30 tablet, Refills: 0    multivitamin (RENA-VIT) TABS tablet Take 1 tablet by mouth daily. Qty: 30 tablet, Refills: 0    thiamine 50 MG tablet Take 1 tablet (50 mg total) by mouth daily. Qty: 30 tablet, Refills: 0      CONTINUE these medications which have NOT CHANGED   Details  allopurinol (ZYLOPRIM) 100 MG tablet Take 200 mg by mouth daily.    aspirin EC 81 MG tablet Take 81 mg by mouth daily.    B Complex-C (B-COMPLEX WITH VITAMIN C) tablet Take 1 tablet by mouth daily.    colchicine 0.6 MG tablet Take 0.6 mg by mouth daily.    famotidine (PEPCID) 40 MG tablet Take 40 mg by mouth at bedtime.    losartan (COZAAR) 100 MG tablet Take 100 mg by mouth daily.    metFORMIN (GLUCOPHAGE) 500 MG tablet Take 1 tablet (500 mg total) by mouth daily with breakfast. Qty: 90 tablet, Refills: 3    metoprolol succinate (TOPROL-XL) 50 MG 24 hr tablet Take 50 mg by mouth daily. Take with or immediately following a meal.    Multiple Vitamins-Minerals (MULTIVITAMIN WITH MINERALS) tablet Take 1 tablet by mouth daily.    omeprazole (PRILOSEC) 20 MG capsule Take  20 mg by mouth 2 (two) times daily.    promethazine (PHENERGAN) 25 MG tablet Take 1 tablet (25 mg total) by mouth 2 (two) times daily. Qty: 60 tablet, Refills: 3        If you experience worsening of your admission symptoms, develop shortness of breath, life threatening emergency, suicidal or homicidal thoughts you must seek medical attention immediately by calling 911 or calling your MD immediately  if symptoms less severe.  You Must read complete instructions/literature along with all the possible adverse  reactions/side effects for all the Medicines you take and that have been prescribed to you. Take any new Medicines after you have completely understood and accept all the possible adverse reactions/side effects.   Please note  You were cared for by a hospitalist during your hospital stay. If you have any questions about your discharge medications or the care you received while you were in the hospital after you are discharged, you can call the unit and asked to speak with the hospitalist on call if the hospitalist that took care of you is not available. Once you are discharged, your primary care physician will handle any further medical issues. Please note that NO REFILLS for any discharge medications will be authorized once you are discharged, as it is imperative that you return to your primary care physician (or establish a relationship with a primary care physician if you do not have one) for your aftercare needs so that they can reassess your need for medications and monitor your lab values.   DATA REVIEW:   CBC   Recent Labs Lab 05/24/15 0213  WBC 11.4*  HGB 12.3*  HCT 37.5*  PLT 208    Chemistries   Recent Labs Lab 05/23/15 0214 05/24/15 0213  NA 141 144  K 3.7 4.3  CL 106 110  CO2 28 29  GLUCOSE 110* 111*  BUN 10 18  CREATININE 1.22 1.33*  CALCIUM 9.0 8.8*  MG  --  1.8  AST 49*  --   ALT 68*  --   ALKPHOS 52  --   BILITOT 0.2*  --     Microbiology Results   Recent Results (from the past 240 hour(s))  MRSA PCR Screening     Status: None   Collection Time: 05/23/15  1:41 PM  Result Value Ref Range Status   MRSA by PCR NEGATIVE NEGATIVE Final    Comment:        The GeneXpert MRSA Assay (FDA approved for NASAL specimens only), is one component of a comprehensive MRSA colonization surveillance program. It is not intended to diagnose MRSA infection nor to guide or monitor treatment for MRSA infections.     RADIOLOGY:  No results found.   Management  plans discussed with the patient, family and they are in agreement.  CODE STATUS:     Code Status Orders        Start     Ordered   05/23/15 1206  Full code   Continuous     05/23/15 1206    Code Status History    Date Active Date Inactive Code Status Order ID Comments User Context   01/23/2015 10:00 AM 01/24/2015  6:29 PM Full Code [id number]  Harrie Foreman, MD Inpatient      TOTAL TIME TAKING CARE OF THIS PATIENT: 40 minutes.    Kamariyah Timberlake M.D on 05/26/2015 at 12:20 PM  Between 7am to 6pm - Pager - [phone] After 6pm go to [email or web address] - password [email or web address]  [facility]  Office  [phone]  CC: Primary care physician; Dicky Doe, MD

## 2015-05-26 NOTE — Clinical Social Work Note (Signed)
Clinical Social Work Assessment  Patient Details  Name: Alan Eaton MRN: [id number] Date of Birth: 04/09/1975  Date of referral:  05/26/15               Reason for consult:  Substance Use/ETOH Abuse                Permission sought to share information with:    Permission granted to share information::     Name::        Agency::     Relationship::     Contact Information:     Housing/Transportation Living arrangements for the past 2 months:  Single Family Home Source of Information:  Patient Patient Interpreter Needed:  None Criminal Activity/Legal Involvement Pertinent to Current Situation/Hospitalization:  No - Comment as needed Significant Relationships:  Other Family Members, Significant Other Lives with:  Significant Other Do you feel safe going back to the place where you live?  Yes Need for family participation in patient care:  Yes (Comment)  Care giving concerns: Patient lives in [place] with his girlfriend and 42 y.o step son.    Social Worker assessment / plan:  Holiday representative (CSW) received consult from MD to provide patient with outpatient substance abuse resources. CSW met with patient alone at bedside. Patient was alert and oriented and sitting up on the side of the bed. CSW introduced self and explained role of CSW department. Patient reported that he lives in [place] with his girlfriend and 91 y.o stepson. Patient reported that he receives $648 in SSI per month. Patient has [facility] and Medicaid. Per patient he drinks alcohol but could not tell me how much. Per patient he does not drink everyday just the 1st and 3rd of the month because that is when he gets his money. Patient denied drug use. Patient's main form of transportation is a scooter. Patient was open to substance abuse resources. CSW provided patient with outpatient substance abuse resources. Patient is familiar with [facility] and appeared motivated to follow up with them. CSW also provided  patient with 2 bus passes for [facility]. Please reconsult if future social work needs arise. CSW signing off.    Employment status:  Disabled (Comment on whether or not currently receiving Disability) Insurance information:  Programmer, applications, Medicaid In [place] PT Recommendations:  Not assessed at this time Information / Referral to community resources:  Outpatient Substance Abuse Treatment Options  Patient/Family's Response to care:  Patient appeared motivated to follow up on outpatient substance abuse resources.   Patient/Family's Understanding of and Emotional Response to Diagnosis, Current Treatment, and Prognosis:  Patient was pleasant throughout assessment and thanked CSW for visit.   Emotional Assessment Appearance:  Appears stated age Attitude/Demeanor/Rapport:    Affect (typically observed):  Accepting, Adaptable, Pleasant Orientation:  Oriented to Self, Oriented to Place, Oriented to  Time, Oriented to Situation Alcohol / Substance use:  Alcohol Use Psych involvement (Current and /or in the community):  Yes (Comment)  Discharge Needs  Concerns to be addressed:  No discharge needs identified Readmission within the last 30 days:  No Current discharge risk:  None Barriers to Discharge:  No Barriers Identified   Alan Freshwater, LCSW 05/26/2015, 1:33 PM

## 2015-05-26 NOTE — Progress Notes (Signed)
All meds returned to pt , discharge instructions given to pt pt given 2 bus passes meds returned vitamine B; Allopurinol ; colchicine; phenergan; metformin; omeprazole; ondansetron; bentyl; losartan; aspirin; famotidine; metoprolol; multivitamin

## 2015-05-26 NOTE — Progress Notes (Signed)
Patient ID: Alan Eaton, male   DOB: 1975-04-18, 40 y.o.   MRN: [id number] [facility] [place] NAME: Alan Eaton    MR#:  [id number]  DATE OF BIRTH:  Aug 07, 1975   came in with overdose on Lortab with suicidal ideation after argument with wife.  Doing well. Wants to go home. Eating well Sitter+ No suicidal ideation  REVIEW OF SYSTEMS:   Review of Systems  Constitutional: Negative for fever, chills and weight loss.  HENT: Negative for ear discharge, ear pain and nosebleeds.   Eyes: Negative for blurred vision, pain and discharge.  Respiratory: Negative for sputum production, shortness of breath, wheezing and stridor.   Cardiovascular: Negative for chest pain, palpitations, orthopnea and PND.  Gastrointestinal: Negative for nausea, vomiting, abdominal pain and diarrhea.  Genitourinary: Negative for urgency and frequency.  Musculoskeletal: Negative for back pain and joint pain.  Neurological: Positive for weakness. Negative for sensory change, speech change and focal weakness.  Psychiatric/Behavioral: Negative for depression and hallucinations. The patient is not nervous/anxious.   All other systems reviewed and are negative.  Tolerating Diet: yes  Tolerating PT:  Not needed  DRUG ALLERGIES:   Allergies  Allergen Reactions  . Bee Venom Swelling  . Bactrim [Sulfamethoxazole-Trimethoprim] Swelling and Rash    VITALS:  Blood pressure 148/80, pulse 82, temperature 99.1 F (37.3 C), temperature source Oral, resp. rate 18, height 6\' 2"  (1.88 m), weight 151.093 kg (333 lb 1.6 oz), SpO2 97 %.  PHYSICAL EXAMINATION:   Physical Exam  GENERAL:  40 y.o.-year-old patient lying in the bed with no acute distress.  morbidly obese  EYES: Pupils equal, round, reactive to light and accommodation. No scleral icterus. Extraocular muscles intact.  HEENT: Head atraumatic, normocephalic. Oropharynx and nasopharynx clear.  NECK:  Supple, no  jugular venous distention. No thyroid enlargement, no tenderness. distantath sounds bilaterally, no wheezing, rales, rhonchi. No use of accessory muscles of respiration.  CARDIOVASCULAR: S1, S2 normal. No murmurs, rubs, or gallops.  ABDOMEN: Soft, nontender, nondistended. Bowel sounds present. No organomegaly or mass.  EXTREMITIES: No cyanosis, clubbing or edema b/l.    NEUROLOGIC: Cranial nerves II through XII are intact. No focal Motor or sensory deficits b/l.   PSYCHIATRIC:  patient is alert and oriented x 3.  SKIN: No obvious rash, lesion, or ulcer.   LABORATORY PANEL:  CBC  Recent Labs Lab 05/24/15 0213  WBC 11.4*  HGB 12.3*  HCT 37.5*  PLT 208    Chemistries   Recent Labs Lab 05/23/15 0214 05/24/15 0213  NA 141 144  K 3.7 4.3  CL 106 110  CO2 28 29  GLUCOSE 110* 111*  BUN 10 18  CREATININE 1.22 1.33*  CALCIUM 9.0 8.8*  MG  --  1.8  AST 49*  --   ALT 68*  --   ALKPHOS 52  --   BILITOT 0.2*  --    Cardiac Enzymes  Recent Labs Lab 05/24/15 0213  TROPONINI <0.03   RADIOLOGY:  No results found. ASSESSMENT AND PLAN:  Alan Eaton is a 40 y.o. male with a known history of diabetes, hypertension, morbid obesity, depression with prior suicide attempts and alcohol abuse present. To the emergency room after overdose on Lortab and Ambien. Patient has an IVC in place. He was noticed to be tachycardic and tachypneic  * Acute encephalopathy due to overdose on Ambien and Lortab IVC in place.  -Consult psychiatry noted. Recommends inpt beh med admit.will have to  D/w dr clapacs today patient presented awake alert oriented 3.   * Acute respiratory failure -Now off BiPAP.on RA Tachycardia and tachypnea-improved   * Diabetes mellitus Sliding scale insulin Resume home meds   * Hypertension  Resume home meds  * DVT prophylaxis with Lovenox  *Morbid obesity with obesity hypoventilation syndrome.   patient reports not having CPAP at home. He has sleep apnea.  We'll have CM look into this.  Pt is medically stable for transfer to inpatient psychiatry.   Management plans discussed with the patient and in agreement.  CODE STATUS: full  DVT Prophylaxis: lovnoex  TOTAL TIME TAKING CARE OF THIS PATIENT: 35 minutes.  >50% time spent on counselling and coordination of care   Note: This dictation was prepared with Dragon dictation along with smaller phrase technology. Any transcriptional errors that result from this process are unintentional.  Shamieka Gullo M.D on 05/26/2015 at 8:22 AM  Between 7am to 6pm - Pager - [phone]  After 6pm go to [email or web address] - password [email or web address] [facility]  Office  [phone]  CC: Primary care physician; Dicky Doe, MD

## 2015-06-03 LAB — BLOOD GAS, ARTERIAL
ACID-BASE EXCESS: 0.3 mmol/L (ref 0.0–3.0)
Allens test (pass/fail): POSITIVE — AB
BICARBONATE: 26.5 meq/L (ref 21.0–28.0)
FIO2: 28
O2 Saturation: 97.1 %
PCO2 ART: 48 mmHg (ref 32.0–48.0)
PH ART: 7.35 (ref 7.350–7.450)
PO2 ART: 96 mmHg (ref 83.0–108.0)
Patient temperature: 37

## 2015-06-19 ENCOUNTER — Other Ambulatory Visit: Payer: Self-pay | Admitting: Family Medicine

## 2015-07-07 ENCOUNTER — Telehealth: Payer: Self-pay | Admitting: Family Medicine

## 2015-07-07 ENCOUNTER — Telehealth: Payer: Self-pay

## 2015-07-07 ENCOUNTER — Other Ambulatory Visit: Payer: Self-pay | Admitting: Family Medicine

## 2015-07-07 MED ORDER — METOPROLOL SUCCINATE ER 50 MG PO TB24: 50.0000 mg | ORAL_TABLET | Freq: Every day | ORAL | Status: DC

## 2015-07-07 NOTE — Telephone Encounter (Signed)
I have sent in meds to his mail order pharmacy.  If he needs [facility] instead, let me know.-jh

## 2015-07-07 NOTE — Telephone Encounter (Signed)
Sent refills

## 2015-07-07 NOTE — Telephone Encounter (Signed)
Pt's girlfriend called in needs refill on his metoprolol succinate (TOPROL-XL) 50 MG 24 hr tablet  [facility].  Thanks Con Memos

## 2015-07-08 ENCOUNTER — Other Ambulatory Visit: Payer: Self-pay

## 2015-07-08 MED ORDER — METOPROLOL SUCCINATE ER 50 MG PO TB24: 50.0000 mg | ORAL_TABLET | Freq: Every day | ORAL | Status: DC

## 2015-07-08 NOTE — Telephone Encounter (Signed)
Had to call in 30 tabs to [facility] of Metoprolol and then allow pill pack to do 90 day but patient all out of meds.

## 2015-07-15 ENCOUNTER — Other Ambulatory Visit: Payer: Self-pay | Admitting: Family Medicine

## 2015-08-05 ENCOUNTER — Other Ambulatory Visit: Payer: Self-pay | Admitting: Family Medicine

## 2015-08-05 MED ORDER — ACCU-CHEK SOFTCLIX LANCET DEV MISC: 1.0000 | Freq: Two times a day (BID) | Status: DC

## 2015-08-05 MED ORDER — GLUCOSE BLOOD VI STRP: 1.0000 | ORAL_STRIP | Freq: Two times a day (BID) | Status: DC

## 2015-08-05 MED ORDER — ALCOHOL SWABS PADS: 1.0000 | MEDICATED_PAD | Freq: Two times a day (BID) | Status: DC

## 2015-08-10 ENCOUNTER — Emergency Department: Payer: Medicare Other

## 2015-08-10 ENCOUNTER — Inpatient Hospital Stay
Admission: EM | Admit: 2015-08-10 | Discharge: 2015-08-11 | DRG: 728 | Disposition: A | Discharge status: Home / Self Care | Payer: Medicare Other | Attending: Specialist | Admitting: Specialist

## 2015-08-10 ENCOUNTER — Other Ambulatory Visit: Payer: Self-pay

## 2015-08-10 DIAGNOSIS — R748 Abnormal levels of other serum enzymes: Secondary | ICD-10-CM | POA: Diagnosis present

## 2015-08-10 DIAGNOSIS — F329 Major depressive disorder, single episode, unspecified: Secondary | ICD-10-CM | POA: Diagnosis present

## 2015-08-10 DIAGNOSIS — Z7984 Long term (current) use of oral hypoglycemic drugs: Secondary | ICD-10-CM | POA: Diagnosis not present

## 2015-08-10 DIAGNOSIS — E119 Type 2 diabetes mellitus without complications: Secondary | ICD-10-CM

## 2015-08-10 DIAGNOSIS — N453 Epididymo-orchitis: Secondary | ICD-10-CM

## 2015-08-10 DIAGNOSIS — K219 Gastro-esophageal reflux disease without esophagitis: Secondary | ICD-10-CM | POA: Diagnosis not present

## 2015-08-10 DIAGNOSIS — R1909 Other intra-abdominal and pelvic swelling, mass and lump: Secondary | ICD-10-CM | POA: Diagnosis not present

## 2015-08-10 DIAGNOSIS — N5089 Other specified disorders of the male genital organs: Secondary | ICD-10-CM

## 2015-08-10 DIAGNOSIS — A419 Sepsis, unspecified organism: Secondary | ICD-10-CM

## 2015-08-10 DIAGNOSIS — Z881 Allergy status to other antibiotic agents status: Secondary | ICD-10-CM | POA: Diagnosis not present

## 2015-08-10 DIAGNOSIS — M109 Gout, unspecified: Secondary | ICD-10-CM | POA: Diagnosis not present

## 2015-08-10 DIAGNOSIS — F1010 Alcohol abuse, uncomplicated: Secondary | ICD-10-CM | POA: Diagnosis present

## 2015-08-10 DIAGNOSIS — Z7982 Long term (current) use of aspirin: Secondary | ICD-10-CM | POA: Diagnosis not present

## 2015-08-10 DIAGNOSIS — N452 Orchitis: Secondary | ICD-10-CM | POA: Diagnosis not present

## 2015-08-10 DIAGNOSIS — I10 Essential (primary) hypertension: Secondary | ICD-10-CM | POA: Diagnosis not present

## 2015-08-10 DIAGNOSIS — R600 Localized edema: Secondary | ICD-10-CM | POA: Diagnosis not present

## 2015-08-10 DIAGNOSIS — Z6837 Body mass index (BMI) 37.0-37.9, adult: Secondary | ICD-10-CM | POA: Diagnosis not present

## 2015-08-10 DIAGNOSIS — Z91030 Bee allergy status: Secondary | ICD-10-CM

## 2015-08-10 DIAGNOSIS — F17210 Nicotine dependence, cigarettes, uncomplicated: Secondary | ICD-10-CM | POA: Diagnosis present

## 2015-08-10 LAB — LACTIC ACID, PLASMA
LACTIC ACID, VENOUS: 1.4 mmol/L (ref 0.5–2.0)
Lactic Acid, Venous: 1.3 mmol/L (ref 0.5–2.0)

## 2015-08-10 LAB — COMPREHENSIVE METABOLIC PANEL
ALBUMIN: 3.6 g/dL (ref 3.5–5.0)
ALK PHOS: 67 U/L (ref 38–126)
ALT: 40 U/L (ref 17–63)
AST: 26 U/L (ref 15–41)
Anion gap: 7 (ref 5–15)
BILIRUBIN TOTAL: 0.5 mg/dL (ref 0.3–1.2)
BUN: 13 mg/dL (ref 6–20)
CO2: 28 mmol/L (ref 22–32)
CREATININE: 1.28 mg/dL — AB (ref 0.61–1.24)
Calcium: 9.2 mg/dL (ref 8.9–10.3)
Chloride: 104 mmol/L (ref 101–111)
GFR calc Af Amer: >60 mL/min (ref >60)
GLUCOSE: 98 mg/dL (ref 65–99)
Potassium: 4.4 mmol/L (ref 3.5–5.1)
Sodium: 139 mmol/L (ref 135–145)
TOTAL PROTEIN: 8.7 g/dL — AB (ref 6.5–8.1)

## 2015-08-10 LAB — CBC WITH DIFFERENTIAL/PLATELET
BASOS ABS: 0.1 10*3/uL (ref 0–0.1)
Basophils Relative: 0 %
EOS ABS: 0.5 10*3/uL (ref 0–0.7)
HCT: 36 % — ABNORMAL LOW (ref 40.0–52.0)
Hemoglobin: 11.7 g/dL — ABNORMAL LOW (ref 13.0–18.0)
Lymphocytes Relative: 11 %
Lymphs Abs: 1.9 10*3/uL (ref 1.0–3.6)
MCH: 29 pg (ref 26.0–34.0)
MCHC: 32.6 g/dL (ref 32.0–36.0)
MCV: 88.9 fL (ref 80.0–100.0)
MONO ABS: 1 10*3/uL (ref 0.2–1.0)
Monocytes Relative: 6 %
Neutro Abs: 14.8 10*3/uL — ABNORMAL HIGH (ref 1.4–6.5)
Neutrophils Relative %: 80 %
PLATELETS: 286 10*3/uL (ref 150–440)
RBC: 4.06 MIL/uL — AB (ref 4.40–5.90)
RDW: 16.4 % — AB (ref 11.5–14.5)
WBC: 18.3 10*3/uL — AB (ref 3.8–10.6)

## 2015-08-10 LAB — URINALYSIS COMPLETE WITH MICROSCOPIC (ARMC ONLY)
BILIRUBIN URINE: NEGATIVE
GLUCOSE, UA: NEGATIVE mg/dL
Hgb urine dipstick: NEGATIVE
KETONES UR: NEGATIVE mg/dL
Nitrite: NEGATIVE
Protein, ur: 30 mg/dL — AB
SQUAMOUS EPITHELIAL / LPF: NONE SEEN
Specific Gravity, Urine: 1.01 (ref 1.005–1.030)
pH: 7 (ref 5.0–8.0)

## 2015-08-10 LAB — TROPONIN I
TROPONIN I: 0.06 ng/mL — AB (ref <0.031)
Troponin I: <0.03 ng/mL (ref <0.031)

## 2015-08-10 MED ORDER — ALLOPURINOL 100 MG PO TABS
200.0000 mg | ORAL_TABLET | Freq: Every day | ORAL | Status: DC
  Administered 2015-08-11: 200 mg via ORAL
  Filled 2015-08-10: qty 2

## 2015-08-10 MED ORDER — ONDANSETRON HCL 4 MG/2ML IJ SOLN
4.0000 mg | Freq: Once | INTRAMUSCULAR | Status: AC
  Administered 2015-08-10: 4 mg via INTRAVENOUS
  Filled 2015-08-10: qty 2

## 2015-08-10 MED ORDER — COLCHICINE 0.6 MG PO TABS
0.6000 mg | ORAL_TABLET | Freq: Every day | ORAL | Status: DC
  Administered 2015-08-11: 0.6 mg via ORAL
  Filled 2015-08-10: qty 1

## 2015-08-10 MED ORDER — ALCOHOL SWABS PADS: 1.0000 | MEDICATED_PAD | Freq: Two times a day (BID) | Status: DC

## 2015-08-10 MED ORDER — DOCUSATE SODIUM 100 MG PO CAPS
100.0000 mg | ORAL_CAPSULE | Freq: Two times a day (BID) | ORAL | Status: DC
  Administered 2015-08-10 – 2015-08-11 (×2): 100 mg via ORAL
  Filled 2015-08-10 (×2): qty 1

## 2015-08-10 MED ORDER — INSULIN ASPART 100 UNIT/ML ~~LOC~~ SOLN: 0.0000 [IU] | Freq: Every day | SUBCUTANEOUS | Status: DC

## 2015-08-10 MED ORDER — CEFTRIAXONE SODIUM 1 G IJ SOLR
1.0000 g | Freq: Once | INTRAMUSCULAR | Status: AC
  Administered 2015-08-10: 1 g via INTRAVENOUS
  Filled 2015-08-10: qty 10

## 2015-08-10 MED ORDER — ENOXAPARIN SODIUM 40 MG/0.4ML ~~LOC~~ SOLN: 40.0000 mg | SUBCUTANEOUS | Status: DC

## 2015-08-10 MED ORDER — ASPIRIN EC 81 MG PO TBEC
81.0000 mg | DELAYED_RELEASE_TABLET | Freq: Every day | ORAL | Status: DC
  Administered 2015-08-11: 81 mg via ORAL
  Filled 2015-08-10: qty 1

## 2015-08-10 MED ORDER — SODIUM CHLORIDE 0.9 % IV SOLN
INTRAVENOUS | Status: AC
  Administered 2015-08-10: 23:00:00 via INTRAVENOUS

## 2015-08-10 MED ORDER — MORPHINE SULFATE (PF) 4 MG/ML IV SOLN
4.0000 mg | Freq: Once | INTRAVENOUS | Status: AC
  Administered 2015-08-10: 4 mg via INTRAVENOUS
  Filled 2015-08-10: qty 1

## 2015-08-10 MED ORDER — KETOROLAC TROMETHAMINE 15 MG/ML IJ SOLN: 15.0000 mg | Freq: Four times a day (QID) | INTRAMUSCULAR | Status: DC | PRN

## 2015-08-10 MED ORDER — LOSARTAN POTASSIUM 50 MG PO TABS
100.0000 mg | ORAL_TABLET | Freq: Every day | ORAL | Status: DC
  Administered 2015-08-11: 100 mg via ORAL
  Filled 2015-08-10: qty 2

## 2015-08-10 MED ORDER — ACETAMINOPHEN 325 MG PO TABS: 650.0000 mg | ORAL_TABLET | Freq: Four times a day (QID) | ORAL | Status: DC | PRN

## 2015-08-10 MED ORDER — METOPROLOL SUCCINATE ER 50 MG PO TB24
50.0000 mg | ORAL_TABLET | Freq: Every day | ORAL | Status: DC
  Administered 2015-08-11: 50 mg via ORAL
  Filled 2015-08-10: qty 1

## 2015-08-10 MED ORDER — LEVOFLOXACIN IN D5W 750 MG/150ML IV SOLN
750.0000 mg | Freq: Once | INTRAVENOUS | Status: AC
  Administered 2015-08-10: 750 mg via INTRAVENOUS
  Filled 2015-08-10: qty 150

## 2015-08-10 MED ORDER — RENA-VITE PO TABS
1.0000 | ORAL_TABLET | Freq: Every day | ORAL | Status: DC
  Administered 2015-08-11: 1 via ORAL
  Filled 2015-08-10: qty 1

## 2015-08-10 MED ORDER — PANTOPRAZOLE SODIUM 40 MG PO TBEC
40.0000 mg | DELAYED_RELEASE_TABLET | Freq: Every day | ORAL | Status: DC
  Administered 2015-08-11: 40 mg via ORAL
  Filled 2015-08-10: qty 1

## 2015-08-10 MED ORDER — ACETAMINOPHEN 650 MG RE SUPP: 650.0000 mg | Freq: Four times a day (QID) | RECTAL | Status: DC | PRN

## 2015-08-10 MED ORDER — PROMETHAZINE HCL 25 MG PO TABS
25.0000 mg | ORAL_TABLET | Freq: Two times a day (BID) | ORAL | Status: DC
  Administered 2015-08-10: 25 mg via ORAL
  Filled 2015-08-10 (×2): qty 1

## 2015-08-10 MED ORDER — SODIUM CHLORIDE 0.9 % IV BOLUS (SEPSIS)
1000.0000 mL | Freq: Once | INTRAVENOUS | Status: AC
  Administered 2015-08-10: 1000 mL via INTRAVENOUS

## 2015-08-10 MED ORDER — OXYCODONE HCL 5 MG PO TABS: 5.0000 mg | ORAL_TABLET | Freq: Four times a day (QID) | ORAL | Status: DC | PRN

## 2015-08-10 MED ORDER — VITAMIN B-1 100 MG PO TABS
50.0000 mg | ORAL_TABLET | Freq: Every day | ORAL | Status: DC
  Administered 2015-08-11: 50 mg via ORAL
  Filled 2015-08-10: qty 1

## 2015-08-10 MED ORDER — SODIUM CHLORIDE 0.9% FLUSH
3.0000 mL | Freq: Two times a day (BID) | INTRAVENOUS | Status: DC
  Administered 2015-08-11: 3 mL via INTRAVENOUS

## 2015-08-10 MED ORDER — FOLIC ACID 1 MG PO TABS
1.0000 mg | ORAL_TABLET | Freq: Every day | ORAL | Status: DC
  Administered 2015-08-11: 1 mg via ORAL
  Filled 2015-08-10: qty 1

## 2015-08-10 MED ORDER — ONDANSETRON HCL 4 MG/2ML IJ SOLN: 4.0000 mg | Freq: Four times a day (QID) | INTRAMUSCULAR | Status: DC | PRN

## 2015-08-10 MED ORDER — INSULIN ASPART 100 UNIT/ML ~~LOC~~ SOLN
0.0000 [IU] | Freq: Three times a day (TID) | SUBCUTANEOUS | Status: DC
  Administered 2015-08-11: 1 [IU] via SUBCUTANEOUS
  Filled 2015-08-10: qty 1

## 2015-08-10 MED ORDER — ONDANSETRON HCL 4 MG PO TABS: 4.0000 mg | ORAL_TABLET | Freq: Four times a day (QID) | ORAL | Status: DC | PRN

## 2015-08-10 MED ORDER — LEVOFLOXACIN IN D5W 750 MG/150ML IV SOLN
750.0000 mg | INTRAVENOUS | Status: DC
  Filled 2015-08-10: qty 150

## 2015-08-10 MED ORDER — B COMPLEX-C PO TABS
1.0000 | ORAL_TABLET | Freq: Every day | ORAL | Status: DC
  Administered 2015-08-11: 1 via ORAL
  Filled 2015-08-10: qty 1

## 2015-08-10 MED ORDER — FAMOTIDINE 20 MG PO TABS
40.0000 mg | ORAL_TABLET | Freq: Every day | ORAL | Status: DC
  Administered 2015-08-10: 40 mg via ORAL
  Filled 2015-08-10: qty 2

## 2015-08-10 MED ORDER — ADULT MULTIVITAMIN W/MINERALS CH
1.0000 | ORAL_TABLET | Freq: Every day | ORAL | Status: DC
  Administered 2015-08-11: 1 via ORAL
  Filled 2015-08-10: qty 1

## 2015-08-10 MED ORDER — ENOXAPARIN SODIUM 30 MG/0.3ML ~~LOC~~ SOLN: 30.0000 mg | SUBCUTANEOUS | Status: DC

## 2015-08-10 NOTE — ED Provider Notes (Signed)
[facility] Emergency Department Provider Note   ____________________________________________  Time seen: Approximately 7:25 PM  I have reviewed the triage vital signs and the nursing notes.   HISTORY  Chief Complaint Testicle Pain    HPI Alan Eaton is a 40 y.o. male patient reports testicular pain since Thursday which was 4 days ago. Swollen tender warm and red. Both sides are involved on the right side seems to be worse. Pain is moderate to severe. Denies any nausea vomiting or diarrhea. I should add that the patient does report he vomits occasionally anyway but there is no unusual nausea vomiting or diarrhea. Patient says nothing else hurts on him. She has had a fever up to 103 and possibly higher at home.   Past Medical History  Diagnosis Date  . Gout   . GERD (gastroesophageal reflux disease)   . Hypertension   . Diabetes mellitus without complication ([place])   . Depression   . Alcohol abuse   . Morbid obesity ([place])   . Suicide ideation     Patient Active Problem List   Diagnosis Date Noted  . Overdose of sedative or hypnotic 05/26/2015  . Alcohol abuse 05/26/2015  . Substance induced mood disorder ([place]) 05/26/2015  . Severe episode of recurrent major depressive disorder, without psychotic features ([place])   . Alcohol dependence with intoxication with complication ([place])   . Overdose 05/23/2015  . Sepsis ([place]) 01/23/2015    Past Surgical History  Procedure Laterality Date  . None      Current Outpatient Rx  Name  Route  Sig  Dispense  Refill  . Alcohol Swabs PADS   Does not apply   1 each by Does not apply route 2 (two) times daily.   100 each   12   . allopurinol (ZYLOPRIM) 100 MG tablet   Oral   Take 200 mg by mouth daily.         Marland Kitchen aspirin EC 81 MG tablet   Oral   Take 81 mg by mouth daily.         . B Complex-C (B-COMPLEX WITH VITAMIN C) tablet   Oral   Take 1 tablet by mouth daily.         . colchicine 0.6  MG tablet   Oral   Take 0.6 mg by mouth daily.         . famotidine (PEPCID) 40 MG tablet   Oral   Take 40 mg by mouth at bedtime.         . folic acid (FOLVITE) 1 MG tablet   Oral   Take 1 tablet (1 mg total) by mouth daily.   30 tablet   0   . glucose blood (ACCU-CHEK AVIVA PLUS) test strip   Other   1 each by Other route 2 (two) times daily. Use as instructed   100 each   12   . Lancet Devices (ACCU-CHEK SOFTCLIX) lancets   Other   1 each by Other route 2 (two) times daily. Use as instructed   1 each   12   . losartan (COZAAR) 100 MG tablet   Oral   Take 100 mg by mouth daily.         . metFORMIN (GLUCOPHAGE) 500 MG tablet      Take 1 tablet by mouth once a day as directed   90 tablet   0     Refill [id number]   . metoprolol succinate (TOPROL-XL) 50 MG  24 hr tablet   Oral   Take 1 tablet (50 mg total) by mouth daily. Take with or immediately following a meal.   30 tablet   0     Please Advise: Rx expired. We are pt's new pharmac ...   . Multiple Vitamins-Minerals (MULTIVITAMIN WITH MINERALS) tablet   Oral   Take 1 tablet by mouth daily.         . multivitamin (RENA-VIT) TABS tablet   Oral   Take 1 tablet by mouth daily.   30 tablet   0   . omeprazole (PRILOSEC) 20 MG capsule   Oral   Take 20 mg by mouth 2 (two) times daily.         . promethazine (PHENERGAN) 25 MG tablet   Oral   Take 1 tablet (25 mg total) by mouth 2 (two) times daily.   60 tablet   3   . thiamine 50 MG tablet   Oral   Take 1 tablet (50 mg total) by mouth daily.   30 tablet   0     Allergies Bee venom and Bactrim  Family History  Problem Relation Age of Onset  . Hypertension    . Diabetes Mellitus II      Social History Social History  Substance Use Topics  . Smoking status: Current Every Day Smoker -- 0.50 packs/day    Types: Cigarettes  . Smokeless tobacco: Not on file  . Alcohol Use: 0.0 oz/week    0 Standard drinks or equivalent per week      Comment: occasional    Review of Systems Constitutional:  fever Eyes: No visual changes. ENT: No sore throat. Cardiovascular: Denies chest pain. Respiratory: Denies shortness of breath. Gastrointestinal: No abdominal pain.  No nausea, no vomiting.  No diarrhea.  No constipation. Genitourinary: Negative for dysuria. Musculoskeletal: Negative for back pain. Skin: Negative for rash. Neurological: Negative for headaches, focal weakness or numbness.  10-point ROS otherwise negative.  ____________________________________________   PHYSICAL EXAM:  VITAL SIGNS: ED Triage Vitals  Enc Vitals Group     BP 08/10/15 1801 133/93 mmHg     Pulse Rate 08/10/15 1801 122     Resp 08/10/15 1801 20     Temp 08/10/15 1801 99 F (37.2 C)     Temp Source 08/10/15 1801 Oral     SpO2 08/10/15 1801 100 %     Weight 08/10/15 1801 280 lb (127.007 kg)     Height 08/10/15 1801 6' (1.829 m)     Head Cir --      Peak Flow --      Pain Score 08/10/15 1804 10     Pain Loc --      Pain Edu? --      Excl. in [place]? --     Constitutional: Alert and oriented. Well appearing and in no acute distress. Eyes: Conjunctivae are normal. PERRL. EOMI. Head: Atraumatic. Nose: No congestion/rhinnorhea. Mouth/Throat: Mucous membranes are moist.  Oropharynx non-erythematous. Neck: No stridor.  Cardiovascular: Tachycardia regular rhythm. Grossly normal heart sounds.  Good peripheral circulation. Respiratory: Normal respiratory effort.  No retractions. Lungs CTAB. Gastrointestinal: Soft and nontender. No distention. No abdominal bruits. No CVA tenderness. Genitourinary: Uncircumcised male and testicles are red swollen and the right one is very firm. Do not see any skin defects. Musculoskeletal: No lower extremity tenderness nor edema.  No joint effusions. Neurologic:  Normal speech and language. No gross focal neurologic deficits are appreciated. No gait instability. Skin:  Skin is warm, dry and intact. No rash  noted. Psychiatric: Mood and affect are normal. Speech and behavior are normal.  ____________________________________________   LABS (all labs ordered are listed, but only abnormal results are displayed)  Labs Reviewed  COMPREHENSIVE METABOLIC PANEL - Abnormal; Notable for the following:    Creatinine, Ser 1.28 (*)    Total Protein 8.7 (*)    All other components within normal limits  CBC WITH DIFFERENTIAL/PLATELET - Abnormal; Notable for the following:    WBC 18.3 (*)    RBC 4.06 (*)    Hemoglobin 11.7 (*)    HCT 36.0 (*)    RDW 16.4 (*)    Neutro Abs 14.8 (*)    All other components within normal limits  TROPONIN I - Abnormal; Notable for the following:    Troponin I 0.06 (*)    All other components within normal limits  URINE CULTURE  CULTURE, BLOOD (ROUTINE X 2)  CULTURE, BLOOD (ROUTINE X 2)  URINALYSIS COMPLETEWITH MICROSCOPIC ([facility] ONLY)  LACTIC ACID, PLASMA  LACTIC ACID, PLASMA   ____________________________________________  EKG  EKG read and interpreted by me shows sinus tachycardia rate of 124 left axis no acute ST-T wave changes ____________________________________________  RADIOLOGY  No sign of testicular torsion per radiology patient does have a density epididymis which could be a mass or possibly an abscess. ____________________________________________   PROCEDURES    ____________________________________________   INITIAL IMPRESSION / ASSESSMENT AND PLAN / ED COURSE  Pertinent labs & imaging results that were available during my care of the patient were reviewed by me and considered in my medical decision making (see chart for details).   ____________________________________________   FINAL CLINICAL IMPRESSION(S) / ED DIAGNOSES  Final diagnoses:  Epididymo-orchitis  Sepsis, due to unspecified organism [facility])      NEW MEDICATIONS STARTED DURING THIS VISIT:  New Prescriptions   No medications on file     Note:  This document was  prepared using Dragon voice recognition software and may include unintentional dictation errors.    Nena Polio, MD 08/10/15 2015

## 2015-08-10 NOTE — ED Notes (Addendum)
One set of blood cultures drawn in triage

## 2015-08-10 NOTE — ED Notes (Signed)
Pt requested iv to be started either in his [facility] or hand, multiple unsuccessful attempts made prior to iv start in the [facility]

## 2015-08-10 NOTE — ED Notes (Signed)
Pt reports testicle pain and swelling since Thursday. Fever. Urinary burning and frequency.

## 2015-08-10 NOTE — Progress Notes (Signed)
Pharmacy Antibiotic Note  Alan Eaton is a 40 y.o. male admitted on 08/10/2015 with orchitis.  Pharmacy has been consulted for Levaquin dosing.  Plan: Levaquin 750 mg q 24 hours ordered by MD. OK for current renal function Height: 6' (182.9 cm) Weight: 280 lb (127.007 kg) IBW/kg (Calculated) : 77.6  Temp (24hrs), Avg:99 F (37.2 C), Min:99 F (37.2 C), Max:99 F (37.2 C)   Recent Labs Lab 08/10/15 1809 08/10/15 1944  WBC 18.3*  --   CREATININE 1.28*  --   LATICACIDVEN  --  1.4    Estimated Creatinine Clearance: 104.6 mL/min (by C-G formula based on Cr of 1.28).    Allergies  Allergen Reactions  . Bee Venom Swelling  . Bactrim [Sulfamethoxazole-Trimethoprim] Swelling and Rash    Antimicrobials this admission: Levaquin  >>    >>   Dose adjustments this admission:   Microbiology results: 5/21 BCx: pending 5/21 UCx: pending    5/21 UA: LE(+) NO2(-) WBC 6-30  Thank you for allowing pharmacy to be a part of this patient's care.  Nikolos Billig S 08/10/2015 10:33 PM

## 2015-08-10 NOTE — H&P (Signed)
[facility] [place] NAME: Alan Eaton    MR#:  [id number]  DATE OF BIRTH:  04/30/1975  DATE OF ADMISSION:  08/10/2015  PRIMARY CARE PHYSICIAN: Dicky Doe, MD   REQUESTING/REFERRING PHYSICIAN: Corinna Capra  CHIEF COMPLAINT:  Testicular swelling and pain  HISTORY OF PRESENT ILLNESS:  Alan Eaton  is a 40 y.o. male with a known history of Essential hypertension, gout and diabetes mellitus and other medical problems is presenting to the ED with a chief complaint of worsening of testicular pain for the past 4 days. It was swollen and red. No similar complaints in the past. Patient tried ice packs and analgesics which with no improvement. Denies any history of STDs in the past. He is with a single partner and uses barriers. In the ED ultrasound has revealed right-sided orchitis. Patient was given IV antibiotics in the ED by the ED physician  PAST MEDICAL HISTORY:   Past Medical History  Diagnosis Date  . Gout   . GERD (gastroesophageal reflux disease)   . Hypertension   . Diabetes mellitus without complication ([place])   . Depression   . Alcohol abuse   . Morbid obesity ([place])   . Suicide ideation     PAST SURGICAL HISTOIRY:   Past Surgical History  Procedure Laterality Date  . None      SOCIAL HISTORY:   Social History  Substance Use Topics  . Smoking status: Current Every Day Smoker -- 0.50 packs/day    Types: Cigarettes  . Smokeless tobacco: Not on file  . Alcohol Use: 0.0 oz/week    0 Standard drinks or equivalent per week     Comment: occasional    FAMILY HISTORY:   Family History  Problem Relation Age of Onset  . Hypertension    . Diabetes Mellitus II      DRUG ALLERGIES:   Allergies  Allergen Reactions  . Bee Venom Swelling  . Bactrim [Sulfamethoxazole-Trimethoprim] Swelling and Rash    REVIEW OF SYSTEMS:  CONSTITUTIONAL: No fever, fatigue or weakness.  EYES: No blurred or double vision.   EARS, NOSE, AND THROAT: No tinnitus or ear pain.  RESPIRATORY: No cough, shortness of breath, wheezing or hemoptysis.  CARDIOVASCULAR: No chest pain, orthopnea, edema.  GASTROINTESTINAL: No nausea, vomiting, diarrhea or abdominal pain.  GENITOURINARY: Reporting testicular pain and swelling No dysuria, hematuria.  ENDOCRINE: No polyuria, nocturia,  HEMATOLOGY: No anemia, easy bruising or bleeding SKIN: No rash or lesion. MUSCULOSKELETAL: No joint pain or arthritis.   NEUROLOGIC: No tingling, numbness, weakness.  PSYCHIATRY: No anxiety or depression.   MEDICATIONS AT HOME:   Prior to Admission medications   Medication Sig Start Date End Date Taking? Authorizing Provider  Alcohol Swabs PADS 1 each by Does not apply route 2 (two) times daily. 08/05/15   Arlis Porta., MD  allopurinol (ZYLOPRIM) 100 MG tablet Take 200 mg by mouth daily.    Historical Provider, MD  aspirin EC 81 MG tablet Take 81 mg by mouth daily.    Historical Provider, MD  B Complex-C (B-COMPLEX WITH VITAMIN C) tablet Take 1 tablet by mouth daily.    Historical Provider, MD  colchicine 0.6 MG tablet Take 0.6 mg by mouth daily.    Historical Provider, MD  famotidine (PEPCID) 40 MG tablet Take 40 mg by mouth at bedtime.    Historical Provider, MD  folic acid (FOLVITE) 1 MG tablet Take 1 tablet (1 mg total) by mouth  daily. 05/26/15   Fritzi Mandes, MD  glucose blood (ACCU-CHEK AVIVA PLUS) test strip 1 each by Other route 2 (two) times daily. Use as instructed 08/05/15   Arlis Porta., MD  Lancet Devices [facility]) lancets 1 each by Other route 2 (two) times daily. Use as instructed 08/05/15   Arlis Porta., MD  losartan (COZAAR) 100 MG tablet Take 100 mg by mouth daily.    Historical Provider, MD  metFORMIN (GLUCOPHAGE) 500 MG tablet Take 1 tablet by mouth once a day as directed 06/19/15   Arlis Porta., MD  metoprolol succinate (TOPROL-XL) 50 MG 24 hr tablet Take 1 tablet (50 mg total) by mouth daily.  Take with or immediately following a meal. 07/08/15   Arlis Porta., MD  Multiple Vitamins-Minerals (MULTIVITAMIN WITH MINERALS) tablet Take 1 tablet by mouth daily.    Historical Provider, MD  multivitamin (RENA-VIT) TABS tablet Take 1 tablet by mouth daily. 05/26/15   Fritzi Mandes, MD  omeprazole (PRILOSEC) 20 MG capsule Take 20 mg by mouth 2 (two) times daily.    Historical Provider, MD  promethazine (PHENERGAN) 25 MG tablet Take 1 tablet (25 mg total) by mouth 2 (two) times daily. 04/24/15   Arlis Porta., MD  thiamine 50 MG tablet Take 1 tablet (50 mg total) by mouth daily. 05/26/15   Fritzi Mandes, MD      VITAL SIGNS:  Blood pressure 133/93, pulse 122, temperature 99 F (37.2 C), temperature source Oral, resp. rate 20, height 6' (1.829 m), weight 127.007 kg (280 lb), SpO2 100 %.  PHYSICAL EXAMINATION:  GENERAL:  40 y.o.-year-old patient lying in the bed with no acute distress. Obese EYES: Pupils equal, round, reactive to light and accommodation. No scleral icterus. Extraocular muscles intact.  HEENT: Head atraumatic, normocephalic. Oropharynx and nasopharynx clear.  NECK:  Supple, no jugular venous distention. No thyroid enlargement, no tenderness.  LUNGS: Normal breath sounds bilaterally, no wheezing, rales,rhonchi or crepitation. No use of accessory muscles of respiration.  CARDIOVASCULAR: S1, S2 normal. No murmurs, rubs, or gallops.  ABDOMEN: Soft, nontender, nondistended. Bowel sounds present. No organomegaly or mass.  Genitourinary-right testicular area is tender, erythematous and edematous. Examined patient with the help of chaperone ED RN Eliezer Lofts EXTREMITIES: No pedal edema, cyanosis, or clubbing.  NEUROLOGIC: Cranial nerves II through XII are intact. Muscle strength 5/5 in all extremities. Sensation intact. Gait not checked.  PSYCHIATRIC: The patient is alert and oriented x 3.  SKIN: No obvious rash, lesion, or ulcer.   LABORATORY PANEL:   CBC  Recent Labs Lab  08/10/15 1809  WBC 18.3*  HGB 11.7*  HCT 36.0*  PLT 286   ------------------------------------------------------------------------------------------------------------------  Chemistries   Recent Labs Lab 08/10/15 1809  NA 139  K 4.4  CL 104  CO2 28  GLUCOSE 98  BUN 13  CREATININE 1.28*  CALCIUM 9.2  AST 26  ALT 40  ALKPHOS 67  BILITOT 0.5   ------------------------------------------------------------------------------------------------------------------  Cardiac Enzymes  Recent Labs Lab 08/10/15 1809  TROPONINI 0.06*   ------------------------------------------------------------------------------------------------------------------  RADIOLOGY:  US Scrotum  08/10/2015  CLINICAL DATA:  40 year old male with testicular swelling EXAM: SCROTAL ULTRASOUND DOPPLER ULTRASOUND OF THE TESTICLES TECHNIQUE: Complete ultrasound examination of the testicles, epididymis, and other scrotal structures was performed. Color and spectral Doppler ultrasound were also utilized to evaluate blood flow to the testicles. COMPARISON:  None. FINDINGS: Right testicle Measurements: 6.2 x 3.6 x 4.4 cm. No mass visualized. The testicular parenchyma is  markedly hypervascular and mildly heterogeneous. Left testicle Measurements: 5.6 x 2.8 x 3.4 cm. No mass or microlithiasis visualized. Right epididymis: Normal in size and appearance. Suggestion of a small 4 x 2 x 2 mm hypoechoic lesion within the epididymal body. Left epididymis:  Normal in size and appearance. Hydrocele:  Small right hydrocele with internal septations. Varicocele:  None visualized. Pulsed Doppler interrogation of both testes demonstrates normal low resistance arterial and venous waveforms bilaterally. IMPRESSION: 1. Hypervascular and mildly heterogeneous right testicle concerning for acute orchitis. 2. Small but mildly complex right-sided hydrocele likely reactive and related to the underlying orchitis. 3. Small 4 x 2 mm hypoechoic structure  within the right epididymal body has a nonspecific appearance. This may represent a small solid lesion such as an adenomatoid tumor, or a complex epididymal cyst. Abscess is considered less likely given the absence of significant edema and hypervascularity within the epididymis. Recommend follow-up testicular ultrasound in 4-6 weeks to assess for stability. Electronically Signed   By: Jacqulynn Cadet M.D.   On: 08/10/2015 19:34   [place] Art/ven Flow Abd Pelv Doppler  08/10/2015  CLINICAL DATA:  39 year old male with testicular swelling EXAM: SCROTAL ULTRASOUND DOPPLER ULTRASOUND OF THE TESTICLES TECHNIQUE: Complete ultrasound examination of the testicles, epididymis, and other scrotal structures was performed. Color and spectral Doppler ultrasound were also utilized to evaluate blood flow to the testicles. COMPARISON:  None. FINDINGS: Right testicle Measurements: 6.2 x 3.6 x 4.4 cm. No mass visualized. The testicular parenchyma is markedly hypervascular and mildly heterogeneous. Left testicle Measurements: 5.6 x 2.8 x 3.4 cm. No mass or microlithiasis visualized. Right epididymis: Normal in size and appearance. Suggestion of a small 4 x 2 x 2 mm hypoechoic lesion within the epididymal body. Left epididymis:  Normal in size and appearance. Hydrocele:  Small right hydrocele with internal septations. Varicocele:  None visualized. Pulsed Doppler interrogation of both testes demonstrates normal low resistance arterial and venous waveforms bilaterally. IMPRESSION: 1. Hypervascular and mildly heterogeneous right testicle concerning for acute orchitis. 2. Small but mildly complex right-sided hydrocele likely reactive and related to the underlying orchitis. 3. Small 4 x 2 mm hypoechoic structure within the right epididymal body has a nonspecific appearance. This may represent a small solid lesion such as an adenomatoid tumor, or a complex epididymal cyst. Abscess is considered less likely given the absence of significant  edema and hypervascularity within the epididymis. Recommend follow-up testicular ultrasound in 4-6 weeks to assess for stability. Electronically Signed   By: Jacqulynn Cadet M.D.   On: 08/10/2015 19:34    EKG:   Orders placed or performed during the hospital encounter of 08/10/15  . EKG 12-Lead  . EKG 12-Lead  . ED EKG  . ED EKG    IMPRESSION AND PLAN:   Alan Eaton  is a 40 y.o. male with a known history of Essential hypertension, gout and diabetes mellitus and other medical problems is presenting to the ED with a chief complaint of worsening of testicular pain for the past 4 days. It was swollen and red. No similar complaints in the past. Patient tried ice packs and analgesics which with no improvement. Denies any history of STDs in the past. He is with a single partner and uses barriers. In the ED ultrasound has revealed right-sided orchitis  #Sepsis secondary to acute orchitis Admit to MedSurg floor Gentle hydration with IV fluids, pain management as needed. IV antibiotics levofloxacin. Patient is denying any history of STDs and denies any purulent discharge If no  improvement will consider urology consult and repeat ultrasound.   #Elevated troponin patient denies any chest pain or shortness of breath Monitor patient on telemetry and cycle cardiac biomarkers.  #Essential hypertension continue his home medication Toprol-XL and titrate as needed basis  #Diabetes mellitus sliding scale insulin. Hold metformin. Diabetic diet  Provide GI and DVT prophylaxis    All the records are reviewed and case discussed with ED provider. Management plans discussed with the patient, family and they are in agreement.  CODE STATUS: Full code, moan Ms. Yohey and girlfriend at the healthcare power of attorney  TOTAL TIME TAKING CARE OF THIS PATIENT: 45 minutes.    Nicholes Mango M.D on 08/10/2015 at 8:44 PM  Between 7am to 6pm - Pager - [phone]  After 6pm go to [email or web address] -  password [email or web address] [place] [facility]  Office  [phone]  CC: Primary care physician; Dicky Doe, MD

## 2015-08-10 NOTE — ED Notes (Signed)
Pt c/o testicular pain, testicular swelling, dysuria beginning 5/18.

## 2015-08-11 LAB — CBC
HCT: 32.2 % — ABNORMAL LOW (ref 40.0–52.0)
Hemoglobin: 10.5 g/dL — ABNORMAL LOW (ref 13.0–18.0)
MCH: 28.7 pg (ref 26.0–34.0)
MCHC: 32.7 g/dL (ref 32.0–36.0)
MCV: 87.8 fL (ref 80.0–100.0)
Platelets: 282 10*3/uL (ref 150–440)
RBC: 3.66 MIL/uL — ABNORMAL LOW (ref 4.40–5.90)
RDW: 16.3 % — ABNORMAL HIGH (ref 11.5–14.5)
WBC: 13.7 10*3/uL — AB (ref 3.8–10.6)

## 2015-08-11 LAB — GLUCOSE, CAPILLARY
GLUCOSE-CAPILLARY: 138 mg/dL — AB (ref 65–99)
Glucose-Capillary: 127 mg/dL — ABNORMAL HIGH (ref 65–99)
Glucose-Capillary: 97 mg/dL (ref 65–99)

## 2015-08-11 LAB — BASIC METABOLIC PANEL
Anion gap: 7 (ref 5–15)
BUN: 14 mg/dL (ref 6–20)
CALCIUM: 8.6 mg/dL — AB (ref 8.9–10.3)
CO2: 24 mmol/L (ref 22–32)
CREATININE: 1.24 mg/dL (ref 0.61–1.24)
Chloride: 108 mmol/L (ref 101–111)
GFR calc Af Amer: >60 mL/min (ref >60)
Glucose, Bld: 87 mg/dL (ref 65–99)
POTASSIUM: 4.4 mmol/L (ref 3.5–5.1)
SODIUM: 139 mmol/L (ref 135–145)

## 2015-08-11 LAB — TROPONIN I

## 2015-08-11 LAB — TSH: TSH: 3.129 u[IU]/mL (ref 0.350–4.500)

## 2015-08-11 MED ORDER — LEVOFLOXACIN 500 MG PO TABS: 500.0000 mg | ORAL_TABLET | Freq: Every day | ORAL | Status: DC

## 2015-08-11 MED ORDER — LEVOFLOXACIN IN D5W 500 MG/100ML IV SOLN
500.0000 mg | INTRAVENOUS | Status: DC
  Filled 2015-08-11: qty 100

## 2015-08-11 NOTE — Progress Notes (Signed)
Pharmacy Antibiotic Note  Alan Eaton is a 40 y.o. male admitted on 08/10/2015 with orchitis.  Pharmacy has been consulted for Levaquin dosing.  Plan: Levaquin 750 mg q 24 hours ordered by MD. Will transition to Levaquin 500 mg IV q24h based on indication.    Height: 6' (182.9 cm) Weight: 280 lb (127.007 kg) IBW/kg (Calculated) : 77.6  Temp (24hrs), Avg:98.7 F (37.1 C), Min:98.5 F (36.9 C), Max:99 F (37.2 C)   Recent Labs Lab 08/10/15 1809 08/10/15 1944 08/10/15 2302 08/11/15 0414  WBC 18.3*  --   --  13.7*  CREATININE 1.28*  --   --  1.24  LATICACIDVEN  --  1.4 1.3  --     Estimated Creatinine Clearance: 108 mL/min (by C-G formula based on Cr of 1.24).    Allergies  Allergen Reactions  . Bee Venom Swelling  . Bactrim [Sulfamethoxazole-Trimethoprim] Swelling and Rash    Antimicrobials this admission: Levaquin 5/21  >>    Dose adjustments this admission:   Microbiology results: 5/21 BCx: pending 5/21 UCx: pending    5/21 UA: LE(+) NO2(-) WBC 6-30  Thank you for allowing pharmacy to be a part of this patient's care.  Eliu Batch G 08/11/2015 9:08 AM

## 2015-08-11 NOTE — Discharge Summary (Signed)
[place] [facility] [place] NAME: Alan Eaton    MR#:  [id number]  DATE OF BIRTH:  [birth year]  DATE OF ADMISSION:  08/10/2015 ADMITTING PHYSICIAN: Nicholes Mango, MD  DATE OF DISCHARGE: 08/11/2015  PRIMARY CARE PHYSICIAN: Dicky Doe, MD    ADMISSION DIAGNOSIS:  Testicular swelling [N50.89] Epididymo-orchitis [N45.3] Sepsis, due to unspecified organism [facility]) [A41.9]  DISCHARGE DIAGNOSIS:  Active Problems:   Orchitis of right testicle   SECONDARY DIAGNOSIS:   Past Medical History  Diagnosis Date  . Gout   . GERD (gastroesophageal reflux disease)   . Hypertension   . Diabetes mellitus without complication ([place])   . Depression   . Alcohol abuse   . Morbid obesity ([place])   . Suicide ideation     HOSPITAL COURSE:   40 year old male with past medical history of gout, GERD, morbid obesity, hypertension, diabetes, alcohol abuse, depression who presented to the hospital due to testicular pain and swelling.  1. Acute orchitis-this was the cause of patient's testicular pain and swelling. -Patient's CT scan of the pelvis was consistent with this. He had no clinical evidence of epididymitis. After getting some IV antibiotics patient's clinical symptoms have improved. He is clinically afebrile and hemodynamically stable presently. His white cell count is trending down. -Patient is presently being discharged on oral Levaquin and also supportive care with ice packs and analgesics. He should follow-up with a urologist as an outpatient.  2. Gout-patient had no acute attack while in the hospital. He will continue his allopurinol, colchicine  3. Diabetes type 2 without complication-patient will resume his metformin.  4. GERD-patient will resume his omeprazole.  5. Essential hypertension-patient will resume his metoprolol, losartan.  DISCHARGE CONDITIONS:   Stable  CONSULTS OBTAINED:     DRUG ALLERGIES:   Allergies  Allergen Reactions   . Bee Venom Swelling  . Bactrim [Sulfamethoxazole-Trimethoprim] Swelling and Rash    DISCHARGE MEDICATIONS:   Current Discharge Medication List    START taking these medications   Details  levofloxacin (LEVAQUIN) 500 MG tablet Take 1 tablet (500 mg total) by mouth daily. Qty: 10 tablet, Refills: 0      CONTINUE these medications which have NOT CHANGED   Details  allopurinol (ZYLOPRIM) 100 MG tablet Take 300 mg by mouth daily.     aspirin EC 81 MG tablet Take 81 mg by mouth daily.    B Complex-C (B-COMPLEX WITH VITAMIN C) tablet Take 1 tablet by mouth daily.    colchicine 0.6 MG tablet Take 0.6 mg by mouth daily.    famotidine (PEPCID) 40 MG tablet Take 40 mg by mouth at bedtime.    glucose blood (ACCU-CHEK AVIVA PLUS) test strip 1 each by Other route 2 (two) times daily. Use as instructed Qty: 100 each, Refills: 12    Lancet Devices (ACCU-CHEK SOFTCLIX) lancets 1 each by Other route 2 (two) times daily. Use as instructed Qty: 1 each, Refills: 12    losartan (COZAAR) 100 MG tablet Take 100 mg by mouth daily.    metFORMIN (GLUCOPHAGE) 500 MG tablet Take 1 tablet by mouth once a day as directed Qty: 90 tablet, Refills: 0    metoprolol succinate (TOPROL-XL) 50 MG 24 hr tablet Take 1 tablet (50 mg total) by mouth daily. Take with or immediately following a meal. Qty: 30 tablet, Refills: 0    Multiple Vitamins-Minerals (MULTIVITAMIN WITH MINERALS) tablet Take 1 tablet by mouth daily.    omeprazole (PRILOSEC) 20 MG capsule Take  20 mg by mouth 2 (two) times daily.    promethazine (PHENERGAN) 25 MG tablet Take 1 tablet (25 mg total) by mouth 2 (two) times daily. Qty: 60 tablet, Refills: 3    Alcohol Swabs PADS 1 each by Does not apply route 2 (two) times daily. Qty: 100 each, Refills: 12    folic acid (FOLVITE) 1 MG tablet Take 1 tablet (1 mg total) by mouth daily. Qty: 30 tablet, Refills: 0    multivitamin (RENA-VIT) TABS tablet Take 1 tablet by mouth daily. Qty: 30  tablet, Refills: 0    thiamine 50 MG tablet Take 1 tablet (50 mg total) by mouth daily. Qty: 30 tablet, Refills: 0         DISCHARGE INSTRUCTIONS:   DIET:  Cardiac diet and Diabetic diet  DISCHARGE CONDITION:  Stable  ACTIVITY:  Activity as tolerated  OXYGEN:  Home Oxygen: No.   Oxygen Delivery: room air  DISCHARGE LOCATION:  home   If you experience worsening of your admission symptoms, develop shortness of breath, life threatening emergency, suicidal or homicidal thoughts you must seek medical attention immediately by calling 911 or calling your MD immediately  if symptoms less severe.  You Must read complete instructions/literature along with all the possible adverse reactions/side effects for all the Medicines you take and that have been prescribed to you. Take any new Medicines after you have completely understood and accpet all the possible adverse reactions/side effects.   Please note  You were cared for by a hospitalist during your hospital stay. If you have any questions about your discharge medications or the care you received while you were in the hospital after you are discharged, you can call the unit and asked to speak with the hospitalist on call if the hospitalist that took care of you is not available. Once you are discharged, your primary care physician will handle any further medical issues. Please note that NO REFILLS for any discharge medications will be authorized once you are discharged, as it is imperative that you return to your primary care physician (or establish a relationship with a primary care physician if you do not have one) for your aftercare needs so that they can reassess your need for medications and monitor your lab values.     Today   No further testicular pain.  No fever, N/V.  Clinically doing well.   VITAL SIGNS:  Blood pressure 152/95, pulse 99, temperature 98.6 F (37 C), temperature source Oral, resp. rate 18, height 6' (1.829  m), weight 127.007 kg (280 lb), SpO2 98 %.  I/O:   Intake/Output Summary (Last 24 hours) at 08/11/15 1443 Last data filed at 08/11/15 0133  Gross per 24 hour  Intake      0 ml  Output      0 ml  Net      0 ml    PHYSICAL EXAMINATION:  GENERAL:  40 y.o.-year-old obese patient lying in the bed in no acute distress.  EYES: Pupils equal, round, reactive to light and accommodation. No scleral icterus. Extraocular muscles intact.  HEENT: Head atraumatic, normocephalic. Oropharynx and nasopharynx clear.  NECK:  Supple, no jugular venous distention. No thyroid enlargement, no tenderness.  LUNGS: Normal breath sounds bilaterally, no wheezing, rales,rhonchi. No use of accessory muscles of respiration.  CARDIOVASCULAR: S1, S2 normal. No murmurs, rubs, or gallops.  ABDOMEN: Soft, non-tender, non-distended. Bowel sounds present. No organomegaly or mass.  EXTREMITIES: No pedal edema, cyanosis, or clubbing.  NEUROLOGIC: Cranial nerves  II through XII are intact. No focal motor or sensory defecits b/l.  PSYCHIATRIC: The patient is alert and oriented x 3. Good affect.  SKIN: No obvious rash, lesion, or ulcer.  GU - mild scrotal swelling but no warmth, tenderness.    DATA REVIEW:   CBC  Recent Labs Lab 08/11/15 0414  WBC 13.7*  HGB 10.5*  HCT 32.2*  PLT 282    Chemistries   Recent Labs Lab 08/10/15 1809 08/11/15 0414  NA 139 139  K 4.4 4.4  CL 104 108  CO2 28 24  GLUCOSE 98 87  BUN 13 14  CREATININE 1.28* 1.24  CALCIUM 9.2 8.6*  AST 26  --   ALT 40  --   ALKPHOS 67  --   BILITOT 0.5  --     Cardiac Enzymes  Recent Labs Lab 08/11/15 1115  TROPONINI <0.03    Microbiology Results  Results for orders placed or performed during the hospital encounter of 05/23/15  MRSA PCR Screening     Status: None   Collection Time: 05/23/15  1:41 PM  Result Value Ref Range Status   MRSA by PCR NEGATIVE NEGATIVE Final    Comment:        The GeneXpert MRSA Assay (FDA approved for  NASAL specimens only), is one component of a comprehensive MRSA colonization surveillance program. It is not intended to diagnose MRSA infection nor to guide or monitor treatment for MRSA infections.     RADIOLOGY:  US Scrotum  08/10/2015  CLINICAL DATA:  40 year old male with testicular swelling EXAM: SCROTAL ULTRASOUND DOPPLER ULTRASOUND OF THE TESTICLES TECHNIQUE: Complete ultrasound examination of the testicles, epididymis, and other scrotal structures was performed. Color and spectral Doppler ultrasound were also utilized to evaluate blood flow to the testicles. COMPARISON:  None. FINDINGS: Right testicle Measurements: 6.2 x 3.6 x 4.4 cm. No mass visualized. The testicular parenchyma is markedly hypervascular and mildly heterogeneous. Left testicle Measurements: 5.6 x 2.8 x 3.4 cm. No mass or microlithiasis visualized. Right epididymis: Normal in size and appearance. Suggestion of a small 4 x 2 x 2 mm hypoechoic lesion within the epididymal body. Left epididymis:  Normal in size and appearance. Hydrocele:  Small right hydrocele with internal septations. Varicocele:  None visualized. Pulsed Doppler interrogation of both testes demonstrates normal low resistance arterial and venous waveforms bilaterally. IMPRESSION: 1. Hypervascular and mildly heterogeneous right testicle concerning for acute orchitis. 2. Small but mildly complex right-sided hydrocele likely reactive and related to the underlying orchitis. 3. Small 4 x 2 mm hypoechoic structure within the right epididymal body has a nonspecific appearance. This may represent a small solid lesion such as an adenomatoid tumor, or a complex epididymal cyst. Abscess is considered less likely given the absence of significant edema and hypervascularity within the epididymis. Recommend follow-up testicular ultrasound in 4-6 weeks to assess for stability. Electronically Signed   By: Jacqulynn Cadet M.D.   On: 08/10/2015 19:34   [place] Art/ven Flow Abd Pelv  Doppler  08/10/2015  CLINICAL DATA:  40 year old male with testicular swelling EXAM: SCROTAL ULTRASOUND DOPPLER ULTRASOUND OF THE TESTICLES TECHNIQUE: Complete ultrasound examination of the testicles, epididymis, and other scrotal structures was performed. Color and spectral Doppler ultrasound were also utilized to evaluate blood flow to the testicles. COMPARISON:  None. FINDINGS: Right testicle Measurements: 6.2 x 3.6 x 4.4 cm. No mass visualized. The testicular parenchyma is markedly hypervascular and mildly heterogeneous. Left testicle Measurements: 5.6 x 2.8 x 3.4 cm. No mass or microlithiasis  visualized. Right epididymis: Normal in size and appearance. Suggestion of a small 4 x 2 x 2 mm hypoechoic lesion within the epididymal body. Left epididymis:  Normal in size and appearance. Hydrocele:  Small right hydrocele with internal septations. Varicocele:  None visualized. Pulsed Doppler interrogation of both testes demonstrates normal low resistance arterial and venous waveforms bilaterally. IMPRESSION: 1. Hypervascular and mildly heterogeneous right testicle concerning for acute orchitis. 2. Small but mildly complex right-sided hydrocele likely reactive and related to the underlying orchitis. 3. Small 4 x 2 mm hypoechoic structure within the right epididymal body has a nonspecific appearance. This may represent a small solid lesion such as an adenomatoid tumor, or a complex epididymal cyst. Abscess is considered less likely given the absence of significant edema and hypervascularity within the epididymis. Recommend follow-up testicular ultrasound in 4-6 weeks to assess for stability. Electronically Signed   By: Jacqulynn Cadet M.D.   On: 08/10/2015 19:34      Management plans discussed with the patient, family and they are in agreement.  CODE STATUS:     Code Status Orders        Start     Ordered   08/10/15 2224  Full code   Continuous     08/10/15 2223    Code Status History    Date Active  Date Inactive Code Status Order ID Comments User Context   05/23/2015 12:06 PM 05/26/2015  4:51 PM Full Code [id number]  Hillary Bow, MD ED   01/23/2015 10:00 AM 01/24/2015  6:29 PM Full Code [id number]  Harrie Foreman, MD Inpatient      TOTAL TIME TAKING CARE OF THIS PATIENT: 40 minutes.    Henreitta Leber M.D on 08/11/2015 at 2:43 PM  Between 7am to 6pm - Pager - [phone]  After 6pm go to [email or web address] - password [email or web address] [facility]  Office  [phone]  CC: Primary care physician; Dicky Doe, MD

## 2015-08-11 NOTE — Consult Note (Signed)
   [facility] Inpatient Consult   08/11/2015  Alan Eaton 1975/11/18 [id number]   Patient screened for potential [place] Management services. Patient is eligible for [place]. Electronic medical record reveals there were no identifiable [facility] care management needs at this time. [facility] Care Management services not appropriate at this time. If patient's post hospital needs change please place a [facility] Care Management consult. For questions please contact:   Areonna Bran RN, [place] [facility] Liaison  [phone]) Business Mobile [phone]) Toll free office

## 2015-08-12 LAB — URINE CULTURE
Culture: NO GROWTH
SPECIAL REQUESTS: NORMAL

## 2015-08-15 LAB — CULTURE, BLOOD (ROUTINE X 2)
CULTURE: NO GROWTH
CULTURE: NO GROWTH

## 2015-08-20 ENCOUNTER — Other Ambulatory Visit: Payer: Self-pay | Admitting: Family Medicine

## 2015-08-26 ENCOUNTER — Inpatient Hospital Stay: Payer: Self-pay | Admitting: Family Medicine

## 2015-09-12 DIAGNOSIS — M1A00X0 Idiopathic chronic gout, unspecified site, without tophus (tophi): Secondary | ICD-10-CM | POA: Diagnosis not present

## 2015-09-15 DIAGNOSIS — G8929 Other chronic pain: Secondary | ICD-10-CM | POA: Diagnosis not present

## 2015-09-15 DIAGNOSIS — M25561 Pain in right knee: Secondary | ICD-10-CM | POA: Diagnosis not present

## 2015-09-15 DIAGNOSIS — M25562 Pain in left knee: Secondary | ICD-10-CM | POA: Diagnosis not present

## 2015-09-15 DIAGNOSIS — M1A00X0 Idiopathic chronic gout, unspecified site, without tophus (tophi): Secondary | ICD-10-CM | POA: Diagnosis not present

## 2015-10-06 ENCOUNTER — Encounter: Payer: Self-pay | Admitting: Family Medicine

## 2015-10-27 ENCOUNTER — Emergency Department
Admission: EM | Admit: 2015-10-27 | Discharge: 2015-10-28 | Disposition: A | Discharge status: Home / Self Care | Payer: Medicare Other | Attending: Emergency Medicine | Admitting: Emergency Medicine

## 2015-10-27 DIAGNOSIS — E119 Type 2 diabetes mellitus without complications: Secondary | ICD-10-CM | POA: Diagnosis not present

## 2015-10-27 DIAGNOSIS — F329 Major depressive disorder, single episode, unspecified: Secondary | ICD-10-CM | POA: Diagnosis not present

## 2015-10-27 DIAGNOSIS — I10 Essential (primary) hypertension: Secondary | ICD-10-CM | POA: Diagnosis not present

## 2015-10-27 DIAGNOSIS — E669 Obesity, unspecified: Secondary | ICD-10-CM

## 2015-10-27 DIAGNOSIS — F10230 Alcohol dependence with withdrawal, uncomplicated: Secondary | ICD-10-CM

## 2015-10-27 DIAGNOSIS — F10229 Alcohol dependence with intoxication, unspecified: Secondary | ICD-10-CM | POA: Diagnosis not present

## 2015-10-27 DIAGNOSIS — F1220 Cannabis dependence, uncomplicated: Secondary | ICD-10-CM | POA: Diagnosis not present

## 2015-10-27 DIAGNOSIS — Z7982 Long term (current) use of aspirin: Secondary | ICD-10-CM | POA: Diagnosis not present

## 2015-10-27 DIAGNOSIS — Z7984 Long term (current) use of oral hypoglycemic drugs: Secondary | ICD-10-CM | POA: Diagnosis not present

## 2015-10-27 DIAGNOSIS — Z79899 Other long term (current) drug therapy: Secondary | ICD-10-CM | POA: Insufficient documentation

## 2015-10-27 DIAGNOSIS — F3289 Other specified depressive episodes: Secondary | ICD-10-CM | POA: Diagnosis not present

## 2015-10-27 DIAGNOSIS — Y906 Blood alcohol level of 120-199 mg/100 ml: Secondary | ICD-10-CM | POA: Diagnosis not present

## 2015-10-27 DIAGNOSIS — F17210 Nicotine dependence, cigarettes, uncomplicated: Secondary | ICD-10-CM | POA: Insufficient documentation

## 2015-10-27 DIAGNOSIS — F1910 Other psychoactive substance abuse, uncomplicated: Secondary | ICD-10-CM

## 2015-10-27 DIAGNOSIS — F1010 Alcohol abuse, uncomplicated: Secondary | ICD-10-CM

## 2015-10-27 LAB — CBC WITH DIFFERENTIAL/PLATELET
BASOS PCT: 0 %
Basophils Absolute: 0 10*3/uL (ref 0–0.1)
EOS ABS: 0.5 10*3/uL (ref 0–0.7)
Eosinophils Relative: 5 %
HCT: 44.9 % (ref 40.0–52.0)
HEMOGLOBIN: 15.5 g/dL (ref 13.0–18.0)
Lymphocytes Relative: 45 %
Lymphs Abs: 4.6 10*3/uL — ABNORMAL HIGH (ref 1.0–3.6)
MCH: 30.9 pg (ref 26.0–34.0)
MCHC: 34.5 g/dL (ref 32.0–36.0)
MCV: 89.4 fL (ref 80.0–100.0)
MONOS PCT: 8 %
Monocytes Absolute: 0.8 10*3/uL (ref 0.2–1.0)
NEUTROS PCT: 42 %
Neutro Abs: 4.3 10*3/uL (ref 1.4–6.5)
Platelets: 237 10*3/uL (ref 150–440)
RBC: 5.02 MIL/uL (ref 4.40–5.90)
RDW: 15.8 % — ABNORMAL HIGH (ref 11.5–14.5)
WBC: 10.2 10*3/uL (ref 3.8–10.6)

## 2015-10-27 LAB — COMPREHENSIVE METABOLIC PANEL
ALBUMIN: 4.1 g/dL (ref 3.5–5.0)
ALT: 51 U/L (ref 17–63)
ANION GAP: 8 (ref 5–15)
AST: 38 U/L (ref 15–41)
Alkaline Phosphatase: 59 U/L (ref 38–126)
BUN: 6 mg/dL (ref 6–20)
CALCIUM: 9.1 mg/dL (ref 8.9–10.3)
CHLORIDE: 108 mmol/L (ref 101–111)
CO2: 24 mmol/L (ref 22–32)
CREATININE: 1.11 mg/dL (ref 0.61–1.24)
GFR calc Af Amer: >60 mL/min (ref >60)
GFR calc non Af Amer: >60 mL/min (ref >60)
Glucose, Bld: 86 mg/dL (ref 65–99)
POTASSIUM: 4 mmol/L (ref 3.5–5.1)
SODIUM: 140 mmol/L (ref 135–145)
TOTAL PROTEIN: 7.5 g/dL (ref 6.5–8.1)
Total Bilirubin: 0.3 mg/dL (ref 0.3–1.2)

## 2015-10-27 LAB — URINE DRUG SCREEN, QUALITATIVE (ARMC ONLY)
AMPHETAMINES, UR SCREEN: NOT DETECTED
BENZODIAZEPINE, UR SCRN: NOT DETECTED
Barbiturates, Ur Screen: NOT DETECTED
Cannabinoid 50 Ng, Ur ~~LOC~~: NOT DETECTED
Cocaine Metabolite,Ur ~~LOC~~: NOT DETECTED
MDMA (ECSTASY) UR SCREEN: NOT DETECTED
METHADONE SCREEN, URINE: NOT DETECTED
Opiate, Ur Screen: NOT DETECTED
PHENCYCLIDINE (PCP) UR S: NOT DETECTED
Tricyclic, Ur Screen: NOT DETECTED

## 2015-10-27 LAB — GLUCOSE, CAPILLARY: Glucose-Capillary: 92 mg/dL (ref 65–99)

## 2015-10-27 LAB — ETHANOL: ALCOHOL ETHYL (B): 198 mg/dL — AB (ref <5)

## 2015-10-27 MED ORDER — ONDANSETRON 8 MG PO TBDP
4.0000 mg | ORAL_TABLET | Freq: Once | ORAL | Status: DC
  Filled 2015-10-27: qty 0.5

## 2015-10-27 MED ORDER — PANTOPRAZOLE SODIUM 40 MG PO TBEC
40.0000 mg | DELAYED_RELEASE_TABLET | Freq: Every day | ORAL | Status: DC
  Administered 2015-10-27 – 2015-10-28 (×2): 40 mg via ORAL
  Filled 2015-10-27 (×2): qty 1

## 2015-10-27 MED ORDER — LORAZEPAM 2 MG PO TABS
2.0000 mg | ORAL_TABLET | Freq: Once | ORAL | Status: AC
  Administered 2015-10-27: 2 mg via ORAL
  Filled 2015-10-27: qty 1

## 2015-10-27 MED ORDER — ONDANSETRON HCL 4 MG PO TABS
ORAL_TABLET | ORAL | Status: AC
  Administered 2015-10-27: 4 mg
  Filled 2015-10-27: qty 1

## 2015-10-27 MED ORDER — LORAZEPAM 1 MG PO TABS
1.0000 mg | ORAL_TABLET | ORAL | Status: AC
Start: 2015-10-27 — End: 2015-10-27
  Administered 2015-10-27: 1 mg via ORAL
  Filled 2015-10-27: qty 1

## 2015-10-27 NOTE — ED Notes (Signed)
Patient resting quietly in room. No noted distress or abnormal behaviors noted. Will continue 15 minute checks and observation by security camera for safety. 

## 2015-10-27 NOTE — ED Provider Notes (Signed)
-----------------------------------------   4:40 PM on 10/27/2015 -----------------------------------------  Patient had elevated withdrawal score, tachycardic, nausea and vomiting. He is improving after Ativan however still remains nauseated still not feeling well and still tremulous. We will give additional Ativan now, and the patient is requesting to go to detox. I have placed another consult for our TTS team to evaluate him to evaluate for possible detox placement.   Delman Kitten, MD 10/27/15 [phone]

## 2015-10-27 NOTE — ED Notes (Signed)
BEHAVIORAL HEALTH ROUNDING Patient sleeping: Yes.   Patient alert and oriented: not applicable SLEEPING Behavior appropriate: Yes.  ; If no, describe: SLEEPING Nutrition and fluids offered: No SLEEPING Toileting and hygiene offered: NoSLEEPING Sitter present: not applicable, Q 15 min safety rounds and observation. Law enforcement present: Yes ODS 

## 2015-10-27 NOTE — ED Notes (Signed)
ENVIRONMENTAL ASSESSMENT  Potentially harmful objects out of patient reach: Yes.  Personal belongings secured: Yes.  Patient dressed in hospital provided attire only: Yes.  Plastic bags out of patient reach: Yes.  Patient care equipment (cords, cables, call bells, lines, and drains) shortened, removed, or accounted for: Yes.  Equipment and supplies removed from bottom of stretcher: Yes.  Potentially toxic materials out of patient reach: Yes.  Sharps container removed or out of patient reach: Yes.   BEHAVIORAL HEALTH ROUNDING  Patient sleeping: No.  Patient alert and oriented: yes  Behavior appropriate: Yes. ; If no, describe:  Nutrition and fluids offered: Yes  Toileting and hygiene offered: Yes  Sitter present: not applicable, Q 15 min safety rounds and observation.  Law enforcement present: Yes ODS  Pt reports he has been drinking daily for year. Pt reports tonight he got into an argument with his wife and she told him if he did not get help she was leaving. Police was called to the house and pt decided to come to the ED. Pt asking for help to detox off alcohol and THC.

## 2015-10-27 NOTE — ED Notes (Signed)
Patient asleep in room. No noted distress or abnormal behavior. Will continue 15 minute checks and observation by security cameras for safety. 

## 2015-10-27 NOTE — ED Provider Notes (Addendum)
-----------------------------------------   2:13 PM on 10/27/2015 -----------------------------------------  Seen and evaluated by a psychiatrist. The patient is safe for discharge at this time. No ongoing SI or HI. He will follow closely as an outpatient. Return precautions and follow-up given and understood.   Schuyler Amor, MD 10/27/15 1413  ----------------------------------------- 2:33 PM on 10/27/2015 -----------------------------------------  Patient has become somewhat tachycardic, this is likely due to early withdrawal although clearly earlier he was intoxicated. We'll give him Ativan and reassess prior to possible discharge.   ----------------------------------------- 3:25 PM on 10/27/2015 -----------------------------------------  Signed out to Dr. Jacqualine Code at the end of my shift.   Schuyler Amor, MD 10/27/15 [place], MD 10/27/15 [phone]

## 2015-10-27 NOTE — BH Assessment (Signed)
Patient is pending review with observation unit with [facility] Ria Comment, [facility]). Patient pulse will need to be stabilized before a bed is offered. Writer informed patient's nurse Rod Holler)

## 2015-10-27 NOTE — ED Notes (Signed)
Per Herbert Spires AC-RN at [facility] patient not stable for admission in Obs unit at [facility]. Will continue to watch for possible admission tomorrow.

## 2015-10-27 NOTE — ED Notes (Signed)
Patient was heard regurgitating in room. According to patient, he had a bad reflux episode as evidenced by dark yellow emesis in the emesis bag. Patient did not want lunch. Patient repositioned in the bed with pillows to sit upright. Patient stated that this intervention made him feel better. Will continue to monitor. Maintained on 15 minute checks and observation by security camera for safety.

## 2015-10-27 NOTE — ED Notes (Signed)

## 2015-10-27 NOTE — ED Notes (Signed)
Reported to Dr that pt pulse was 121 and  He was nausea and some vomiting ,pts dc will be held at this time will re eval . Pt offers no other c/o and remains cooeprative

## 2015-10-27 NOTE — ED Triage Notes (Signed)
Pt presents to ED requesting detox from ETOH and THC. Pt states he's currently been seen by [facility], but "they're not doing nothing for me really". Pt states [facility] told him to come to the ER and "they'd pick him up from here". Pt denies any thoughts of SI/HI. Pt reports last drink of ETOH was "10 minutes ago".

## 2015-10-27 NOTE — ED Notes (Signed)
Pt. Alert and oriented, warm and dry, in no distress. Pt. Denies SI, HI, and AVH. Pt denies any withdraw symptoms at time of assessment. Patient laying in bed resting. Pt. Encouraged to let nursing staff know of any concerns or needs.

## 2015-10-27 NOTE — ED Notes (Signed)
ED BHU PLACEMENT JUSTIFICATION Is the patient under IVC or is there intent for IVC: No. Is the patient medically cleared: Yes.   Is there vacancy in the ED BHU: Yes.   Is the population mix appropriate for patient: Yes.   Is the patient awaiting placement in inpatient or outpatient setting: Yes.   Has the patient had a psychiatric consult: Yes.   Survey of unit performed for contraband, proper placement and condition of furniture, tampering with fixtures in bathroom, shower, and each patient room: Yes.  ; Findings: NA APPEARANCE/BEHAVIOR calm, cooperative and adequate rapport can be established NEURO ASSESSMENT Orientation: time, place and person Hallucinations: No.None noted (Hallucinations) Speech: Normal Gait: normal RESPIRATORY ASSESSMENT Normal expansion.  Clear to auscultation.  No rales, rhonchi, or wheezing. CARDIOVASCULAR ASSESSMENT regular rate and rhythm, S1, S2 normal, no murmur, click, rub or gallop GASTROINTESTINAL ASSESSMENT soft, nontender, BS WNL, no r/g EXTREMITIES normal strength, tone, and muscle mass PLAN OF CARE Provide calm/safe environment. Vital signs assessed twice daily. ED BHU Assessment once each 12-hour shift. Collaborate with intake RN daily or as condition indicates. Assure the ED provider has rounded once each shift. Provide and encourage hygiene. Provide redirection as needed. Assess for escalating behavior; address immediately and inform ED provider.  Assess family dynamic and appropriateness for visitation as needed: Yes.  ; If necessary, describe findings: NA Educate the patient/family about BHU procedures/visitation: Yes.  ; If necessary, describe findings: NA  

## 2015-10-27 NOTE — ED Notes (Signed)
Called and notified er charge nurse about cancelled dc and that pt is now on er observation status

## 2015-10-27 NOTE — ED Notes (Signed)
Sandwich and soft drink given.  

## 2015-10-27 NOTE — ED Notes (Signed)
Report called to Sharlee Blew, RN in ED BHU.

## 2015-10-27 NOTE — ED Notes (Addendum)
Patient came to the ED by police stating that he wanted help with alcohol and drugs. Patient reports being depressed recently, not sleeping well, and isolating from other. He denies SI/HI/AVH and pain. Patient is calm and cooperative, currently endorses feeling depressed but denies feelings of anxiety. Patient maintains a flat affect at this time. Patient complains of acid reflux, green emesis bag provided. Will continue to monitor. Patient requested if staff could write note stating that he was in the hospital so that his court date could be excused. Patient notified that this RN would notify supervisor to determine proper protocol. Maintained on 15 minute checks and observation by security camera for safety.

## 2015-10-27 NOTE — ED Notes (Signed)
Pt being looked at for observation unit in [place] . Pt and mother made aware

## 2015-10-27 NOTE — ED Notes (Signed)
ENVIRONMENTAL ASSESSMENT Potentially harmful objects out of patient reach: Yes Personal belongings secured: Yes Patient dressed in hospital provided attire only: Yes Plastic bags out of patient reach: Yes Patient care equipment (cords, cables, call bells, lines, and drains) shortened, removed, or accounted for: Yes Equipment and supplies removed from bottom of stretcher: Yes Potentially toxic materials out of patient reach: Yes Sharps container removed or out of patient reach: Yes  Patient currently in room resting. No signs of distress noted. Maintained on 15 minute checks and observation by security camera for safety.

## 2015-10-27 NOTE — ED Notes (Addendum)
Per nursing supervisor and hospital risk management representative, if patient had court papers stating that his court date was today, he could call the number on the bottom of the paper stating that he was in the hospital and nursing staff could confirm that he was present. Patient advised of this by nursing staff and staff offered to get the court paper from his belongings so that he could call. Patient stated, "I don't feel like doing that right now". Staff asked patient again if he was sure he didn't want to call. Patient asked if staff could call for him and staff stated that was not possible. Staff then prompted patient to call the number again and patient refused.

## 2015-10-27 NOTE — BH Assessment (Signed)
Assessment Note  Alan Eaton is an 40 y.o. male. Alan Eaton arrived to the ED by way of [facility] [employer].  He reports that he contacted them because he needed some help.  He states that he needs help with alcohol or drugs.  He states that he is feeling depressed.  He states that he has been feeling this way "for a while, though I never told no one".  He reports feeling down and sleeping less. He reports a decrease in socialization.  He reports feeling helpless at times. He denied symptoms of anxiety.  He denied having auditory or visual hallucinations.  He denied suicidal or homicidal ideation or intent.  He reports drinking alcohol up to 20 minutes to his visit to the hospital.  He reports drinking 19 -12 once beers.  He reports that he smokes marijuana "but not much".  He reports drinking excessively since the age of 89. He reports current relationship problems.  He states that "I just need the help, I want to come off the alcohol and the drugs".  He has had an overdose episode in March 2017, while he was intoxicated. He is currently not employed and is receiving disability.  Diagnosis: Depression, Substance abuse  Past Medical History:  Past Medical History:  Diagnosis Date  . Alcohol abuse   . Depression   . Diabetes mellitus without complication ([place])   . GERD (gastroesophageal reflux disease)   . Gout   . Hypertension   . Morbid obesity ([place])   . Suicide ideation     Past Surgical History:  Procedure Laterality Date  . none      Family History:  Family History  Problem Relation Age of Onset  . Hypertension    . Diabetes Mellitus II      Social History:  reports that he has been smoking Cigarettes.  He has been smoking about 0.50 packs per day. He has never used smokeless tobacco. He reports that he drinks alcohol. He reports that he uses drugs, including Marijuana, about 1 time per week.  Additional Social History:  Alcohol / Drug Use History of alcohol / drug  use?: Yes Longest period of sobriety (when/how long): 2 weeks Negative Consequences of Use: Personal relationships Withdrawal Symptoms: Diarrhea, Nausea / Vomiting, Sweats Substance #1 Name of Substance 1: Alcohol 1 - Age of First Use: 13 1 - Amount (size/oz): 3-4 quarts 1 - Frequency: daily 1 - Last Use / Amount: 10/27/2015 Substance #2 Name of Substance 2: Marijuana 2 - Age of First Use: 14 2 - Amount (size/oz): unknown "a little bit" " a little could last me a couple of months".  2 - Frequency: "not often" 2 - Last Use / Amount: 10/26/2015  CIWA: CIWA-Ar BP: (!) 151/82 Pulse Rate: (!) 111 COWS:    Allergies:  Allergies  Allergen Reactions  . Bee Venom Swelling  . Bactrim [Sulfamethoxazole-Trimethoprim] Swelling and Rash    Home Medications:  (Not in a hospital admission)  OB/GYN Status:  No LMP for male patient.  General Assessment Data Location of Assessment: [facility] ED TTS Assessment: In system Is this a Tele or Face-to-Face Assessment?: Face-to-Face Is this an Initial Assessment or a Re-assessment for this encounter?: Initial Assessment Marital status: Divorced Ross name: n/a Is patient pregnant?: No Pregnancy Status: No Living Arrangements: Alone (States "thats what its gonna be") Can pt return to current living arrangement?: Yes Admission Status: Voluntary Is patient capable of signing voluntary admission?: Yes Referral Source: Self/Family/Friend Insurance type: [place]  Health Care  Medical Screening Exam ([place]) Medical Exam completed: Yes  Crisis Care Plan Living Arrangements: Alone (States "thats what its gonna be") Legal Guardian: Other: (Self) Name of Psychiatrist: [facility] Name of Therapist: [facility]  Education Status Is patient currently in school?: No Current Grade: n/a Highest grade of school patient has completed: 12th Name of school: [employer] , [place], [place] Contact person: n/a  Risk to self with the past 6 months Suicidal Ideation:  No Has patient been a risk to self within the past 6 months prior to admission? : No Suicidal Intent: No Has patient had any suicidal intent within the past 6 months prior to admission? : No Is patient at risk for suicide?: No Suicidal Plan?: No Has patient had any suicidal plan within the past 6 months prior to admission? : No Access to Means: No What has been your use of drugs/alcohol within the last 12 months?: Use of marijuana and alcohol Previous Attempts/Gestures: Yes How many times?: 1 Other Self Harm Risks: denied Triggers for Past Attempts: Unknown Intentional Self Injurious Behavior: None Family Suicide History: Yes (Uncle) Recent stressful life event(s): Conflict (Comment) (Arguing with his girlfriend) Persecutory voices/beliefs?: No Depression: Yes Depression Symptoms: Loss of interest in usual pleasures, Feeling worthless/self pity (substance use) Substance abuse history and/or treatment for substance abuse?: No Suicide prevention information given to non-admitted patients: Not applicable  Risk to Others within the past 6 months Homicidal Ideation: No Does patient have any lifetime risk of violence toward others beyond the six months prior to admission? : No Thoughts of Harm to Others: No Current Homicidal Intent: No Current Homicidal Plan: No Access to Homicidal Means: No Identified Victim: None identified History of harm to others?: No Assessment of Violence: In distant past Violent Behavior Description: reports it was in the past and would not elaborate on what occurred (reports he has completed all that he had to do for it.) Does patient have access to weapons?: No Criminal Charges Pending?: Yes Describe Pending Criminal Charges: Driving while impared, revocation of license Does patient have a court date: Yes Court Date: 10/27/15 Is patient on probation?: No  Psychosis Hallucinations: None noted Delusions: None noted  Mental Status  Report Appearance/Hygiene: Other (Comment), Disheveled Eye Contact: Fair Motor Activity: Restlessness, Hyperactivity Speech: Slurred Level of Consciousness: Alert Mood: Depressed Affect: Appropriate to circumstance Anxiety Level: None Thought Processes: Coherent Judgement: Unimpaired Orientation: Person, Place, Time, Situation Obsessive Compulsive Thoughts/Behaviors: None  Cognitive Functioning Concentration: Normal Memory: Recent Intact IQ: Average Insight: Fair Impulse Control: Fair Appetite: Fair Sleep: Increased Vegetative Symptoms: None  ADLScreening [facility] Assessment Services) Patient's cognitive ability adequate to safely complete daily activities?: Yes Patient able to express need for assistance with ADLs?: Yes Independently performs ADLs?: Yes (appropriate for developmental age)  Prior Inpatient Therapy Prior Inpatient Therapy: Yes Prior Therapy Dates: 2010 Prior Therapy Facilty/Provider(s): [facility] Reason for Treatment: Depression, alcohol abuse  Prior Outpatient Therapy Prior Outpatient Therapy: Yes Prior Therapy Dates: Currently Prior Therapy Facilty/Provider(s): [facility] Reason for Treatment: Alcohol abuse, depression Does patient have an ACCT team?: No Does patient have Intensive In-House Services?  : No Does patient have [facility] services? : No Does patient have P4CC services?: No  ADL Screening (condition at time of admission) Patient's cognitive ability adequate to safely complete daily activities?: Yes Patient able to express need for assistance with ADLs?: Yes Independently performs ADLs?: Yes (appropriate for developmental age)       Abuse/Neglect Assessment (Assessment to be complete while patient is  alone) Physical Abuse: Denies Verbal Abuse: Denies Sexual Abuse: Denies Exploitation of patient/patient's resources: Denies Self-Neglect: Denies     Regulatory affairs officer (For Healthcare) Does patient have an advance directive?: No Would patient like  information on creating an advanced directive?: No - patient declined information    Additional Information 1:1 In Past 12 Months?: No CIRT Risk: No Elopement Risk: No Does patient have medical clearance?: Yes     Disposition:  Disposition Initial Assessment Completed for this Encounter: Yes Disposition of Patient: Other dispositions  On Site Evaluation by:   Reviewed with Physician:    Elmer Bales 10/27/2015 4:26 AM

## 2015-10-27 NOTE — Discharge Instructions (Addendum)
You have been seen in the Emergency Department (ED)  today for a psychiatric complaint.  You have been evaluated by psychiatry and we believe you are safe to be discharged from the hospital.    Take the librium as prescribed to help with withdrawal symptoms. Do not drink and take Librium at the same time as it may cause you to stop breathing. Return to emergency department if you're having hallucinations, severe nausea or vomiting, severe diarrhea, chest pain, or seizures.  Please return to the Emergency Department (ED)  immediately if you have ANY thoughts of hurting yourself or anyone else, so that we may help you.  Please avoid alcohol and drug use.  Follow up with your doctor and/or therapist as soon as possible regarding today's ED  visit.   You may call crisis hotline for Alan Eaton at [phone].

## 2015-10-27 NOTE — ED Notes (Signed)
Patient mother called asking if nursing staff could write an email to his lawyer to say that he was being treated in the hospital and that this was why he missed his court date. Patient mother did not provide code. Mother informed by nursing staff that supervisor would be notified to see if this was possible. No other information about patient status was provided to mother.

## 2015-10-27 NOTE — ED Provider Notes (Addendum)
Time Seen: Approximately 307 I have reviewed the triage notes  Chief Complaint: Addiction Problem   History of Present Illness: Alan Eaton is a 40 y.o. male who states he has a long history of substance abuse. The patient states that he was referred here by his insurance company for evaluation for alcohol and marijuana addiction treatment. Patient states that he drank approximately 20 minutes prior to my evaluation and had heavy alcohol consumption throughout the day. He states he smoked "" a little "" marijuana. He denies any other illicit drugs. He apparently is been seen by our [facility] but states they are "" not helping him "".   Past Medical History:  Diagnosis Date  . Alcohol abuse   . Depression   . Diabetes mellitus without complication ([place])   . GERD (gastroesophageal reflux disease)   . Gout   . Hypertension   . Morbid obesity ([place])   . Suicide ideation     Patient Active Problem List   Diagnosis Date Noted  . Orchitis of right testicle 08/10/2015  . Overdose of sedative or hypnotic 05/26/2015  . Alcohol abuse 05/26/2015  . Substance induced mood disorder ([place]) 05/26/2015  . Severe episode of recurrent major depressive disorder, without psychotic features ([place])   . Alcohol dependence with intoxication with complication ([place])   . Overdose 05/23/2015  . Sepsis ([place]) 01/23/2015    Past Surgical History:  Procedure Laterality Date  . none      Past Surgical History:  Procedure Laterality Date  . none      Current Outpatient Rx  . Order #: [id number] Class: Normal  . Order #: [id number] Class: Historical Med  . Order #: [id number] Class: Historical Med  . Order #: [id number] Class: Historical Med  . Order #: [id number] Class: Historical Med  . Order #: [id number] Class: Historical Med  . Order #: [id number] Class: Normal  . Order #: [id number] Class: Normal  . Order #: [id number] Class: Normal  . Order #: [id number] Class: Print  . Order #: [id number] Class: Historical Med  .  Order #: [id number] Class: Normal  . Order #: [id number] Class: Normal  . Order #: [id number] Class: Historical Med  . Order #: [id number] Class: Normal  . Order #: [id number] Class: Historical Med  . Order #: [id number] Class: Normal  . Order #: [id number] Class: Normal    Allergies:  Bee venom and Bactrim [sulfamethoxazole-trimethoprim]  Family History: Family History  Problem Relation Age of Onset  . Hypertension    . Diabetes Mellitus II      Social History: Social History  Substance Use Topics  . Smoking status: Current Every Day Smoker    Packs/day: 0.50    Types: Cigarettes  . Smokeless tobacco: Never Used  . Alcohol use 0.0 oz/week     Comment: occasional     Review of Systems:   10 point review of systems was performed and was otherwise negative:  Constitutional: No fever Eyes: No visual disturbances ENT: No sore throat, ear pain Cardiac: No chest pain Respiratory: No shortness of breath, wheezing, or stridor Abdomen: No abdominal pain, no vomiting, No diarrhea Endocrine: No weight loss, No night sweats Extremities: No peripheral edema, cyanosis Skin: No rashes, easy bruising Neurologic: No focal weakness, trouble with speech or swollowing Urologic: No dysuria, Hematuria, or urinary frequency   Physical Exam:  ED Triage Vitals  Enc Vitals Group     BP 10/27/15 0300 (!) 151/82     Pulse Rate 10/27/15 0300 (!) 111     Resp 10/27/15 0300 18     Temp 10/27/15  0300 98.7 F (37.1 C)     Temp Source 10/27/15 0300 Oral     SpO2 10/27/15 0300 95 %     Weight 10/27/15 0300 (!) 380 lb (172.4 kg)     Height 10/27/15 0300 6' 2" (1.88 m)     Head Circumference --      Peak Flow --      Pain Score 10/27/15 0301 0     Pain Loc --      Pain Edu? --      Excl. in GC? --     General: Awake , Alert , and Oriented times 3; GCS 15 Head: Normal cephalic , atraumatic Eyes: Pupils equal , round, reactive to light Nose/Throat: No nasal drainage, patent upper airway without  erythema or exudate.  Neck: Supple, Full range of motion, No anterior adenopathy or palpable thyroid masses Lungs: Clear to ascultation without wheezes , rhonchi, or rales Heart: Regular rate, regular rhythm without murmurs , gallops , or rubs Abdomen: Morbidly obese Soft, non tender without rebound, guarding , or rigidity; bowel sounds positive and symmetric in all 4 quadrants. No organomegaly .        Extremities: 2 plus symmetric pulses. No edema, clubbing or cyanosis Neurologic: normal ambulation, Motor symmetric without deficits, sensory intact Skin: warm, dry, no rashes   Labs:   All laboratory work was reviewed including any pertinent negatives or positives listed below:  Labs Reviewed  ETHANOL  URINE DRUG SCREEN, QUALITATIVE ([facility] ONLY)  Patient's blood alcohol levels are elevated at 198  ED Course:   Patient advised to TTS services some feelings of depression. No current suicidal thoughts or homicidal thoughts.  Psychiatric consultation has been established Clinical Course    Patient will be observed to reaches sobriety and be able to converse with psychiatry. Currently ethanol and urine drug screen levels are pending. I added a comprehensive panel and CBC due to the patient's medical history especially with diabetes etc.   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 Patient's currently here voluntarily and I see no reason to involuntarily commit the patient.            S , MD 10/27/15 0524     S , MD 10/27/15 0625  

## 2015-10-27 NOTE — Consult Note (Signed)
[facility] Consult   Reason for Consult:  Consult for 40 year old man with a history of alcohol abuse Referring Physician:  McShane Patient Identification: Alan Eaton MRN:  [id number] Principal Diagnosis: Alcohol withdrawal [facility]) Diagnosis:   Patient Active Problem List   Diagnosis Date Noted  . Obesity [E66.9] 10/27/2015  . Alcohol withdrawal ([place]) [F10.239] 10/27/2015  . Orchitis of right testicle [N45.2] 08/10/2015  . Overdose of sedative or hypnotic [T42.71XA] 05/26/2015  . Alcohol abuse [F10.10] 05/26/2015  . Substance induced mood disorder ([place] ) [F19.94] 05/26/2015  . Severe episode of recurrent major depressive disorder, without psychotic features ([place]) [F33.2]   . Alcohol dependence with intoxication with complication ([place]) [V69.450]   . Overdose [T50.901A] 05/23/2015  . Sepsis ([place]) [A41.9] 01/23/2015    Total Time spent with patient: 45 minutes  Subjective:   Alan Eaton is a 40 y.o. male patient admitted with "drinking and using marijuana".  HPI:  Patient interviewed. Chart reviewed. Labs and vitals reviewed. 40 year old man with a history of substance abuse problems came into the hospital saying he wanted help with his drinking and drug use. He's been drinking approximately 1812 ounce beers a day. Apparently has been doing that on and off for a long period of time although he says it wasn't long ago that he was able to stop for just a week and there have been times when he was able to stop for longer periods of that in the past. He also is using marijuana regularly. Denies other drug abuse. Apparently the motivation for coming into the hospital instead is girlfriend told him that she was leaving and in part this was related to his substance abuse. Patient denies that he's been feeling particularly depressed. Denies sleep problems. No other specific physical complaints.  Social history: Doesn't work. Gets disability. Lives with his girlfriend and her  son. Pretty limited activity during the day.  Medical history: Patient is morbidly obese suffers from hypertension and gastric reflux.  Substance abuse history: Patient denies any history of alcohol withdrawal seizures. He denied initially having had any inpatient or residential treatment but then admitted that he's had 1 detox here although it sounds like that was also in the context of depression.  Past Psychiatric History: Patient says he's had 1 suicide attempt by overdose which was over a year ago. Recently he's been having no suicidal thoughts at all mood feels stable not getting any outpatient psychiatric treatment.  Risk to Self: Suicidal Ideation: No Suicidal Intent: No Is patient at risk for suicide?: No Suicidal Plan?: No Access to Means: No What has been your use of drugs/alcohol within the last 12 months?: Use of marijuana and alcohol How many times?: 1 Other Self Harm Risks: denied Triggers for Past Attempts: Unknown Intentional Self Injurious Behavior: None Risk to Others: Homicidal Ideation: No Thoughts of Harm to Others: No Current Homicidal Intent: No Current Homicidal Plan: No Access to Homicidal Means: No Identified Victim: None identified History of harm to others?: No Assessment of Violence: In distant past Violent Behavior Description: reports it was in the past and would not elaborate on what occurred (reports he has completed all that he had to do for it.) Does patient have access to weapons?: No Criminal Charges Pending?: Yes Describe Pending Criminal Charges: Driving while impared, revocation of license Does patient have a court date: Yes Court Date: 10/27/15 Prior Inpatient Therapy: Prior Inpatient Therapy: Yes Prior Therapy Dates: 2010 Prior Therapy Facilty/Provider(s): [facility] Reason for Treatment: Depression, alcohol abuse Prior Outpatient  Therapy: Prior Outpatient Therapy: Yes Prior Therapy Dates: Currently Prior Therapy Facilty/Provider(s):  [facility] Reason for Treatment: Alcohol abuse, depression Does patient have an ACCT team?: No Does patient have Intensive In-House Services?  : No Does patient have [facility] services? : No Does patient have P4CC services?: No  Past Medical History:  Past Medical History:  Diagnosis Date  . Alcohol abuse   . Depression   . Diabetes mellitus without complication ([place])   . GERD (gastroesophageal reflux disease)   . Gout   . Hypertension   . Morbid obesity ([place])   . Suicide ideation     Past Surgical History:  Procedure Laterality Date  . none     Family History:  Family History  Problem Relation Age of Onset  . Hypertension    . Diabetes Mellitus II     Family Psychiatric  History: Says both of his grandparents drank too much. Social History:  History  Alcohol Use  . 0.0 oz/week    Comment: occasional     History  Drug Use  . Frequency: 1.0 time per week  . Types: Marijuana    Social History   Social History  . Marital status: Divorced    Spouse name: N/A  . Number of children: N/A  . Years of education: N/A   Social History Main Topics  . Smoking status: Current Every Day Smoker    Packs/day: 0.50    Types: Cigarettes  . Smokeless tobacco: Never Used  . Alcohol use 0.0 oz/week     Comment: occasional  . Drug use:     Frequency: 1.0 time per week    Types: Marijuana  . Sexual activity: Not Asked   Other Topics Concern  . None   Social History Narrative  . None   Additional Social History:    Allergies:   Allergies  Allergen Reactions  . Bee Venom Swelling  . Bactrim [Sulfamethoxazole-Trimethoprim] Swelling and Rash    Labs:  Results for orders placed or performed during the hospital encounter of 10/27/15 (from the past 48 hour(s))  Glucose, capillary     Status: None   Collection Time: 10/27/15  3:30 AM  Result Value Ref Range   Glucose-Capillary 92 65 - 99 mg/dL  Ethanol     Status: Abnormal   Collection Time: 10/27/15  4:48 AM  Result Value  Ref Range   Alcohol, Ethyl (B) 198 (H) <5 mg/dL    Comment:        LOWEST DETECTABLE LIMIT FOR SERUM ALCOHOL IS 5 mg/dL FOR MEDICAL PURPOSES ONLY   Urine Drug Screen, Qualitative ([facility] only)     Status: None   Collection Time: 10/27/15  4:48 AM  Result Value Ref Range   Tricyclic, Ur Screen NONE DETECTED NONE DETECTED   Amphetamines, Ur Screen NONE DETECTED NONE DETECTED   MDMA (Ecstasy)Ur Screen NONE DETECTED NONE DETECTED   Cocaine Metabolite,Ur [place] NONE DETECTED NONE DETECTED   Opiate, Ur Screen NONE DETECTED NONE DETECTED   Phencyclidine (PCP) Ur S NONE DETECTED NONE DETECTED   Cannabinoid 50 Ng, Ur [place] NONE DETECTED NONE DETECTED   Barbiturates, Ur Screen NONE DETECTED NONE DETECTED   Benzodiazepine, Ur Scrn NONE DETECTED NONE DETECTED   Methadone Scn, Ur NONE DETECTED NONE DETECTED    Comment: (NOTE) 492  Tricyclics, urine               Cutoff 1000 ng/mL 200  Amphetamines, urine  Cutoff 1000 ng/mL 300  MDMA (Ecstasy), urine           Cutoff 500 ng/mL 400  Cocaine Metabolite, urine       Cutoff 300 ng/mL 500  Opiate, urine                   Cutoff 300 ng/mL 600  Phencyclidine (PCP), urine      Cutoff 25 ng/mL 700  Cannabinoid, urine              Cutoff 50 ng/mL 800  Barbiturates, urine             Cutoff 200 ng/mL 900  Benzodiazepine, urine           Cutoff 200 ng/mL 1000 Methadone, urine                Cutoff 300 ng/mL 1100 1200 The urine drug screen provides only a preliminary, unconfirmed 1300 analytical test result and should not be used for non-medical 1400 purposes. Clinical consideration and professional judgment should 1500 be applied to any positive drug screen result due to possible 1600 interfering substances. A more specific alternate chemical method 1700 must be used in order to obtain a confirmed analytical result.  1800 Gas chromato graphy / mass spectrometry (GC/MS) is the preferred 1900 confirmatory method.   Comprehensive metabolic panel      Status: None   Collection Time: 10/27/15  4:48 AM  Result Value Ref Range   Sodium 140 135 - 145 mmol/L   Potassium 4.0 3.5 - 5.1 mmol/L   Chloride 108 101 - 111 mmol/L   CO2 24 22 - 32 mmol/L   Glucose, Bld 86 65 - 99 mg/dL   BUN 6 6 - 20 mg/dL   Creatinine, Ser 1.11 0.61 - 1.24 mg/dL   Calcium 9.1 8.9 - 10.3 mg/dL   Total Protein 7.5 6.5 - 8.1 g/dL   Albumin 4.1 3.5 - 5.0 g/dL   AST 38 15 - 41 U/L   ALT 51 17 - 63 U/L   Alkaline Phosphatase 59 38 - 126 U/L   Total Bilirubin 0.3 0.3 - 1.2 mg/dL   GFR calc non Af Amer >60 >60 mL/min   GFR calc Af Amer >60 >60 mL/min    Comment: (NOTE) The eGFR has been calculated using the CKD EPI equation. This calculation has not been validated in all clinical situations. eGFR's persistently <60 mL/min signify possible Chronic Kidney Disease.    Anion gap 8 5 - 15  CBC with Differential/Platelet     Status: Abnormal   Collection Time: 10/27/15  4:48 AM  Result Value Ref Range   WBC 10.2 3.8 - 10.6 K/uL   RBC 5.02 4.40 - 5.90 MIL/uL   Hemoglobin 15.5 13.0 - 18.0 g/dL   HCT 44.9 40.0 - 52.0 %   MCV 89.4 80.0 - 100.0 fL   MCH 30.9 26.0 - 34.0 pg   MCHC 34.5 32.0 - 36.0 g/dL   RDW 15.8 (H) 11.5 - 14.5 %   Platelets 237 150 - 440 K/uL   Neutrophils Relative % 42 %   Neutro Abs 4.3 1.4 - 6.5 K/uL   Lymphocytes Relative 45 %   Lymphs Abs 4.6 (H) 1.0 - 3.6 K/uL   Monocytes Relative 8 %   Monocytes Absolute 0.8 0.2 - 1.0 K/uL   Eosinophils Relative 5 %   Eosinophils Absolute 0.5 0 - 0.7 K/uL   Basophils Relative 0 %   Basophils Absolute  0.0 0 - 0.1 K/uL    Current Facility-Administered Medications  Medication Dose Route Frequency Provider Last Rate Last Dose  . pantoprazole (PROTONIX) EC tablet 40 mg  40 mg Oral Daily Schuyler Amor, MD   40 mg at 10/27/15 0175   Current Outpatient Prescriptions  Medication Sig Dispense Refill  . Alcohol Swabs PADS 1 each by Does not apply route 2 (two) times daily. 100 each 12  . allopurinol  (ZYLOPRIM) 100 MG tablet Take 100 mg by mouth daily.    Marland Kitchen allopurinol (ZYLOPRIM) 300 MG tablet Take 300 mg by mouth daily.    Marland Kitchen aspirin EC 81 MG tablet Take 81 mg by mouth daily.    . B Complex-C (B-COMPLEX WITH VITAMIN C) tablet Take 1 tablet by mouth daily.    . colchicine 0.6 MG tablet Take 0.6 mg by mouth daily as needed.     . dicyclomine (BENTYL) 20 MG tablet Take 20 mg by mouth daily.    . famotidine (PEPCID) 40 MG tablet Take 40 mg by mouth at bedtime.    Marland Kitchen glucose blood (ACCU-CHEK AVIVA PLUS) test strip 1 each by Other route 2 (two) times daily. Use as instructed 100 each 12  . Lancet Devices (ACCU-CHEK SOFTCLIX) lancets 1 each by Other route 2 (two) times daily. Use as instructed 1 each 12  . losartan (COZAAR) 100 MG tablet Take 100 mg by mouth daily.    . meloxicam (MOBIC) 7.5 MG tablet Take 7.5 mg by mouth daily.    . metFORMIN (GLUCOPHAGE) 500 MG tablet Take 1 tablet by mouth once a day as directed 90 tablet 0  . metoprolol succinate (TOPROL-XL) 50 MG 24 hr tablet Take 50 mg by mouth daily. Take with or immediately following a meal.    . Multiple Vitamins-Minerals (MULTIVITAMIN WITH MINERALS) tablet Take 1 tablet by mouth daily.    Marland Kitchen omeprazole (PRILOSEC) 20 MG capsule Take 20 mg by mouth 2 (two) times daily.    . promethazine (PHENERGAN) 25 MG tablet TAKE 1 TABLET BY MOUTH TWICE DAILY 60 tablet 6    Musculoskeletal: Strength & Muscle Tone: decreased Gait & Station: unsteady Patient leans: N/A  Psychiatric Specialty Exam: Physical Exam  Nursing note and vitals reviewed. Constitutional: He appears well-developed and well-nourished.  HENT:  Head: Normocephalic and atraumatic.  Eyes: Conjunctivae are normal. Pupils are equal, round, and reactive to light.  Neck: Normal range of motion.  Cardiovascular: Normal heart sounds.   Respiratory: Effort normal. No respiratory distress.  GI: Soft.  Musculoskeletal: Normal range of motion.  Neurological: He is alert.  Skin: Skin is  warm and dry.  Psychiatric: Judgment normal. His mood appears anxious. His affect is blunt. His speech is delayed. He is slowed. Thought content is not paranoid and not delusional. Cognition and memory are normal. He expresses no suicidal ideation. He expresses no suicidal plans.    Review of Systems  Constitutional: Negative.   HENT: Negative.   Eyes: Negative.   Respiratory: Negative.   Cardiovascular: Negative.   Gastrointestinal: Negative.   Musculoskeletal: Negative.   Skin: Negative.   Neurological: Negative.   Psychiatric/Behavioral: Positive for substance abuse. Negative for depression, hallucinations, memory loss and suicidal ideas. The patient is not nervous/anxious and does not have insomnia.     Blood pressure 132/90, pulse (!) 121, temperature 97.7 F (36.5 C), temperature source Oral, resp. rate 18, height 6' 2"  (1.88 m), weight (!) 172.4 kg (380 lb), SpO2 96 %.Body mass index is 48.79  kg/m.  General Appearance: Disheveled  Eye Contact:  Minimal  Speech:  Slow  Volume:  Decreased  Mood:  Euthymic  Affect:  Constricted  Thought Process:  Goal Directed  Orientation:  Full (Time, Place, and Person)  Thought Content:  Logical  Suicidal Thoughts:  No  Homicidal Thoughts:  No  Memory:  Immediate;   Good Recent;   Fair Remote;   Fair  Judgement:  Fair  Insight:  Fair  Psychomotor Activity:  Decreased  Concentration:  Concentration: Fair  Recall:  [employer] of Knowledge:  Fair  Language:  Fair  Akathisia:  No  Handed:  Right  AIMS (if indicated):     Assets:  Desire for Improvement Housing Resilience  ADL's:  Intact  Cognition:  WNL  Sleep:        Treatment Plan Summary: Plan 40 year old man is requesting help with alcohol withdrawal. Patient was not tremulous or agitated when I spoke with him. Denied any history of alcohol withdrawal seizures. No sign of delirium evident. I don't think there is any indication that he needs inpatient psychiatric treatment.  Case reviewed with emergency room physician. He will be referred to [facility] for outpatient treatment. Subsequently however his pulse has gone up to 121 and his CIWA scale is elevated. He is going to be given some medicine for detox and reassess to see if he needs medical alcohol withdrawal treatment. No need for other psychiatric intervention at this point.  Disposition: Patient does not meet criteria for psychiatric inpatient admission. Supportive therapy provided about ongoing stressors. Refer to IOP.  Alethia Berthold, MD 10/27/2015 2:48 PM

## 2015-10-27 NOTE — BH Assessment (Signed)
Per request of Psych MD (Dr. Weber Cooks), writer provided the patient with information and instructions on how to access [facility] ([facility] [employer]).  Patient states he was already with [facility] and will follow up with them.  Patient denies SI/HI and AV/H.

## 2015-10-28 DIAGNOSIS — F1220 Cannabis dependence, uncomplicated: Secondary | ICD-10-CM | POA: Diagnosis not present

## 2015-10-28 MED ORDER — LOSARTAN POTASSIUM 50 MG PO TABS
100.0000 mg | ORAL_TABLET | Freq: Once | ORAL | Status: AC
  Administered 2015-10-28: 100 mg via ORAL

## 2015-10-28 MED ORDER — METOPROLOL SUCCINATE ER 50 MG PO TB24
ORAL_TABLET | ORAL | Status: AC
  Filled 2015-10-28: qty 1

## 2015-10-28 MED ORDER — METOPROLOL SUCCINATE ER 50 MG PO TB24
50.0000 mg | ORAL_TABLET | Freq: Every day | ORAL | Status: DC
  Administered 2015-10-28: 50 mg via ORAL

## 2015-10-28 MED ORDER — CHLORDIAZEPOXIDE HCL 25 MG PO CAPS: ORAL_CAPSULE | ORAL | 0 refills | Status: DC

## 2015-10-28 MED ORDER — LOSARTAN POTASSIUM 50 MG PO TABS
ORAL_TABLET | ORAL | Status: AC
  Administered 2015-10-28: 12:00:00
  Filled 2015-10-28: qty 2

## 2015-10-28 MED ORDER — CHLORDIAZEPOXIDE HCL 25 MG PO CAPS
50.0000 mg | ORAL_CAPSULE | Freq: Three times a day (TID) | ORAL | Status: DC
  Administered 2015-10-28: 50 mg via ORAL
  Filled 2015-10-28: qty 2

## 2015-10-28 NOTE — ED Provider Notes (Signed)
-----------------------------------------   6:55 AM on 10/28/2015 -----------------------------------------   Blood pressure (!) 148/95, pulse (!) 101, temperature 98 F (36.7 C), temperature source Oral, resp. rate 18, height 6\' 2"  (1.88 m), weight (!) 380 lb (172.4 kg), SpO2 100 %.  The patient had no acute events since last update.  Calm and cooperative at this time.  Disposition is pending per Psychiatry/Behavioral Medicine team recommendations.     Paulette Blanch, MD 10/28/15 [phone]

## 2015-10-28 NOTE — ED Provider Notes (Addendum)
-----------------------------------------   11:20 AM on 10/28/2015 -----------------------------------------   Vitals:   10/28/15 1131 10/28/15 1133  BP: (!) 148/80 (!) 145/80  Pulse: 88 88  Resp: 20   Temp: 98 F (36.7 C)     This is a 40 year old male who presented to the ED requesting detox from alcohol and marijuana. They she was evaluated by psychiatry yesterday and cleared for discharge. TTS team was trying to find possible placement for detox however they have been unable to find placement due to patient's weight. Apparently the beds available cannot tolerate the patient's weight of 380 pounds. Therefore patient will be discharged. Patient has been tachycardic and hypertensive here and received a few rounds of ativan. This morning, I started patient on librium taper for his symptoms with improvement of VS. Patient was also not re-started on his home meds including his anti-hypertensive losartan and toprol which could also be contributing to his tachycardia and HTN. Will give his meds now. Most recent set of vitals WNL. CIWA 0. Patient's labs WNL. Will dc with librium taper. Patient has no h/o complicated withdrawals.   Rudene Re, MD 10/28/15 [place], MD 10/28/15 1210

## 2015-10-28 NOTE — ED Notes (Signed)
Dr er notified about last vs orders received

## 2015-10-28 NOTE — ED Notes (Signed)
Pt states he feels better , he is just  Disappointed  That   he could not get in a detox center or [facility]

## 2015-10-28 NOTE — ED Notes (Signed)
Pt dc with mother with rx and follow up appts , pt in no distress with no c/o on dc

## 2015-11-15 ENCOUNTER — Encounter: Payer: Self-pay | Admitting: Emergency Medicine

## 2015-11-15 ENCOUNTER — Emergency Department
Admission: EM | Admit: 2015-11-15 | Discharge: 2015-11-15 | Disposition: A | Discharge status: Home / Self Care | Payer: Medicare Other | Attending: Emergency Medicine | Admitting: Emergency Medicine

## 2015-11-15 ENCOUNTER — Emergency Department: Payer: Medicare Other

## 2015-11-15 DIAGNOSIS — F17210 Nicotine dependence, cigarettes, uncomplicated: Secondary | ICD-10-CM | POA: Diagnosis not present

## 2015-11-15 DIAGNOSIS — E119 Type 2 diabetes mellitus without complications: Secondary | ICD-10-CM | POA: Insufficient documentation

## 2015-11-15 DIAGNOSIS — J069 Acute upper respiratory infection, unspecified: Secondary | ICD-10-CM | POA: Insufficient documentation

## 2015-11-15 DIAGNOSIS — Z7984 Long term (current) use of oral hypoglycemic drugs: Secondary | ICD-10-CM | POA: Diagnosis not present

## 2015-11-15 DIAGNOSIS — Z79899 Other long term (current) drug therapy: Secondary | ICD-10-CM | POA: Diagnosis not present

## 2015-11-15 DIAGNOSIS — I10 Essential (primary) hypertension: Secondary | ICD-10-CM | POA: Insufficient documentation

## 2015-11-15 DIAGNOSIS — J209 Acute bronchitis, unspecified: Secondary | ICD-10-CM | POA: Diagnosis not present

## 2015-11-15 DIAGNOSIS — Z7982 Long term (current) use of aspirin: Secondary | ICD-10-CM | POA: Diagnosis not present

## 2015-11-15 DIAGNOSIS — R05 Cough: Secondary | ICD-10-CM | POA: Diagnosis not present

## 2015-11-15 HISTORY — DX: Bronchitis, not specified as acute or chronic: J40

## 2015-11-15 MED ORDER — BENZONATATE 100 MG PO CAPS: 200.0000 mg | ORAL_CAPSULE | Freq: Three times a day (TID) | ORAL | 0 refills | Status: DC | PRN

## 2015-11-15 MED ORDER — PREDNISONE 10 MG PO TABS: ORAL_TABLET | ORAL | 0 refills | Status: DC

## 2015-11-15 NOTE — ED Provider Notes (Signed)
[facility] Emergency Department Provider Note   ____________________________________________   First MD Initiated Contact with Patient 11/15/15 1413     (approximate)  I have reviewed the triage vital signs and the nursing notes.   HISTORY  Chief Complaint Cough    HPI Alan Eaton is a 40 y.o. male is here with complaint of nonproductive cough for approximately one week. Patient has a history of bronchitis and currently is using albuterol inhaler twice a day. His mother is also with him and states that he has been taking Robitussin-DM over-the-counter without any relief. Patient denies any fever, chills, throat pain or ear pain. Patient has continued to eat normally. He complains of "coughing all night". Patient denies any shortness of breath or chest pain. In the past when patient has had bronchitis he is taken prednisone and is familiar with it. Patient continues to smoke one pack cigarettes per day. Currently he rates his pain as a 7/10.     Past Medical History:  Diagnosis Date  . Alcohol abuse   . Bronchitis   . Depression   . Diabetes mellitus without complication ([place])   . GERD (gastroesophageal reflux disease)   . Gout   . Hypertension   . Morbid obesity ([place])   . Suicide ideation     Patient Active Problem List   Diagnosis Date Noted  . Obesity 10/27/2015  . Alcohol withdrawal ([place]) 10/27/2015  . Orchitis of right testicle 08/10/2015  . Overdose of sedative or hypnotic 05/26/2015  . Alcohol abuse 05/26/2015  . Substance induced mood disorder ([place]) 05/26/2015  . Severe episode of recurrent major depressive disorder, without psychotic features ([place])   . Alcohol dependence with intoxication with complication ([place])   . Overdose 05/23/2015  . Sepsis ([place]) 01/23/2015    Past Surgical History:  Procedure Laterality Date  . none      Prior to Admission medications   Medication Sig Start Date End Date Taking? Authorizing  Provider  Alcohol Swabs PADS 1 each by Does not apply route 2 (two) times daily. 08/05/15   Arlis Porta., MD  allopurinol (ZYLOPRIM) 100 MG tablet Take 100 mg by mouth daily. 09/15/15   Historical Provider, MD  allopurinol (ZYLOPRIM) 300 MG tablet Take 300 mg by mouth daily.    Historical Provider, MD  aspirin EC 81 MG tablet Take 81 mg by mouth daily.    Historical Provider, MD  B Complex-C (B-COMPLEX WITH VITAMIN C) tablet Take 1 tablet by mouth daily.    Historical Provider, MD  benzonatate (TESSALON PERLES) 100 MG capsule Take 2 capsules (200 mg total) by mouth 3 (three) times daily as needed for cough. 11/15/15 11/14/16  Johnn Hai, PA-C  chlordiazePOXIDE (LIBRIUM) 25 MG capsule 50 mg 3 times a day x 3 days 50 mg 2 times a day for 3 days 50 mg once a day for 3 days stop 10/28/15   Rudene Re, MD  colchicine 0.6 MG tablet Take 0.6 mg by mouth daily as needed.     Historical Provider, MD  dicyclomine (BENTYL) 20 MG tablet Take 20 mg by mouth daily.    Historical Provider, MD  famotidine (PEPCID) 40 MG tablet Take 40 mg by mouth at bedtime.    Historical Provider, MD  glucose blood (ACCU-CHEK AVIVA PLUS) test strip 1 each by Other route 2 (two) times daily. Use as instructed 08/05/15   Arlis Porta., MD  Lancet Devices [facility]) lancets 1 each by  Other route 2 (two) times daily. Use as instructed 08/05/15   Arlis Porta., MD  losartan (COZAAR) 100 MG tablet Take 100 mg by mouth daily.    Historical Provider, MD  meloxicam (MOBIC) 7.5 MG tablet Take 7.5 mg by mouth daily.    Historical Provider, MD  metFORMIN (GLUCOPHAGE) 500 MG tablet Take 1 tablet by mouth once a day as directed 06/19/15   Arlis Porta., MD  metoprolol succinate (TOPROL-XL) 50 MG 24 hr tablet Take 50 mg by mouth daily. Take with or immediately following a meal.    Historical Provider, MD  Multiple Vitamins-Minerals (MULTIVITAMIN WITH MINERALS) tablet Take 1 tablet by mouth daily.     Historical Provider, MD  omeprazole (PRILOSEC) 20 MG capsule Take 20 mg by mouth 2 (two) times daily.    Historical Provider, MD  predniSONE (DELTASONE) 10 MG tablet Take 3 tablets once a day for 3 days. 11/15/15   Johnn Hai, PA-C  promethazine (PHENERGAN) 25 MG tablet TAKE 1 TABLET BY MOUTH TWICE DAILY 08/21/15   Arlis Porta., MD    Allergies Bee venom and Bactrim [sulfamethoxazole-trimethoprim]  Family History  Problem Relation Age of Onset  . Hypertension    . Diabetes Mellitus II      Social History Social History  Substance Use Topics  . Smoking status: Current Every Day Smoker    Packs/day: 0.50    Types: Cigarettes  . Smokeless tobacco: Never Used  . Alcohol use 0.0 oz/week     Comment: occasional    Review of Systems Constitutional: No fever/chills ENT: No sore throat. Cardiovascular: Denies chest pain. Respiratory: Denies shortness of breath.Positive for nonproductive cough. Gastrointestinal: No abdominal pain.  No nausea, no vomiting.   Musculoskeletal: Negative for back pain. Skin: Negative for rash. Neurological: Negative for headaches, focal weakness or numbness.  10-point ROS otherwise negative.  ____________________________________________   PHYSICAL EXAM:  VITAL SIGNS: ED Triage Vitals  Enc Vitals Group     BP 11/15/15 1251 (!) 161/94     Pulse Rate 11/15/15 1251 88     Resp 11/15/15 1251 20     Temp 11/15/15 1251 98.2 F (36.8 C)     Temp src --      SpO2 11/15/15 1251 98 %     Weight 11/15/15 1252 (!) 380 lb (172.4 kg)     Height 11/15/15 1252 6\' 2"  (1.88 m)     Head Circumference --      Peak Flow --      Pain Score 11/15/15 1252 7     Pain Loc --      Pain Edu? --      Excl. in [place]? --     Constitutional: Alert and oriented. Well appearing and in no acute distress. Eyes: Conjunctivae are normal. PERRL. EOMI. Head: Atraumatic. Nose: Mild congestion/no rhinnorhea.   Usually sees the TMs are clear bilaterally. Mouth/Throat:  Mucous membranes are moist.  Oropharynx non-erythematous. Neck: No stridor.   Hematological/Lymphatic/Immunilogical: No cervical lymphadenopathy. Cardiovascular: Normal rate, regular rhythm. Grossly normal heart sounds.  Good peripheral circulation. Respiratory: Normal respiratory effort.  No retractions. Lungs CTAB. No wheezing was noted and patient had good lung sounds throughout. There was a nonproductive course cough noted frequently during his ER visit. Gastrointestinal: Soft and nontender. No distention.  No CVA tenderness. Musculoskeletal: Moves upper and lower extremities without difficulty. Normal gait was noted. Neurologic:  Normal speech and language. No gross focal neurologic deficits are appreciated.  No gait instability. Skin:  Skin is warm, dry and intact. No rash noted. Psychiatric: Mood and affect are normal. Speech and behavior are normal.  ____________________________________________   LABS (all labs ordered are listed, but only abnormal results are displayed)  Labs Reviewed - No data to display   RADIOLOGY  Chest x-ray per radiologist no acute cardiopulmonary disease. I, Johnn Hai, personally viewed and evaluated these images (plain radiographs) as part of my medical decision making, as well as reviewing the written report by the radiologist. ____________________________________________   PROCEDURES  Procedure(s) performed: None  Procedures  Critical Care performed: No  ____________________________________________   INITIAL IMPRESSION / ASSESSMENT AND PLAN / ED COURSE  Pertinent labs & imaging results that were available during my care of the patient were reviewed by me and considered in my medical decision making (see chart for details).    Clinical Course   Patient was reassured that he did not have pneumonia. Patient was placed on prednisone 30 mg for 3 days. He is also to take Tessalon 2 tablets 3 times a day for cough. No narcotic  medication was prescribed for him since he was recently in the emergency room for substance abuse problems. Patient was encouraged to increase his albuterol inhaler from 2 times a day to 4 times a day. He is also made aware that he could use his nebulizer machine as needed as well. He was encouraged to discontinue or decrease smoking as possible. He is to follow-up with his primary care doctor in [place] if any continued problems.  ____________________________________________   FINAL CLINICAL IMPRESSION(S) / ED DIAGNOSES  Final diagnoses:  URI (upper respiratory infection)  Acute bronchitis, unspecified organism  Cigarette smoker      NEW MEDICATIONS STARTED DURING THIS VISIT:  New Prescriptions   BENZONATATE (TESSALON PERLES) 100 MG CAPSULE    Take 2 capsules (200 mg total) by mouth 3 (three) times daily as needed for cough.   PREDNISONE (DELTASONE) 10 MG TABLET    Take 3 tablets once a day for 3 days.     Note:  This document was prepared using Dragon voice recognition software and may include unintentional dictation errors.    Johnn Hai, PA-C 11/15/15 1441    Orbie Pyo, MD 11/20/15 2108

## 2015-11-15 NOTE — ED Triage Notes (Signed)
Cough x 1 week.Marland Kitchen Has been taking robitussin dm and states not working.

## 2015-11-15 NOTE — Discharge Instructions (Signed)
Discontinue taking Robitussin DM for cough. Increase albuterol inhaler to 4 times per day. Begin taking prednisone as directed.  He may see a slight increase in your blood sugars that you are checking her while taking prednisone. Adhere to your diabetic diet while on this medication Tessalon Perles as directed for cough. Follow-up with Dr. Luan Pulling if any continued problems in the next 3-4 days. Discontinue or reduce smoking while you're sick.

## 2015-11-18 ENCOUNTER — Other Ambulatory Visit: Payer: Self-pay | Admitting: Family Medicine

## 2015-12-20 ENCOUNTER — Other Ambulatory Visit: Payer: Self-pay | Admitting: Family Medicine

## 2016-01-02 ENCOUNTER — Inpatient Hospital Stay: Admit: 2016-01-02 | Payer: Self-pay | Admitting: Psychiatry

## 2016-01-02 ENCOUNTER — Emergency Department
Admission: EM | Admit: 2016-01-02 | Discharge: 2016-01-02 | Payer: Medicare Other | Attending: Emergency Medicine | Admitting: Emergency Medicine

## 2016-01-02 ENCOUNTER — Inpatient Hospital Stay
Admit: 2016-01-02 | Discharge: 2016-01-05 | DRG: 881 | Disposition: A | Discharge status: Home / Self Care | Payer: Medicare Other | Source: Intra-hospital | Attending: Psychiatry | Admitting: Psychiatry

## 2016-01-02 ENCOUNTER — Encounter: Payer: Self-pay | Admitting: Emergency Medicine

## 2016-01-02 DIAGNOSIS — Z8249 Family history of ischemic heart disease and other diseases of the circulatory system: Secondary | ICD-10-CM

## 2016-01-02 DIAGNOSIS — Z6841 Body Mass Index (BMI) 40.0 and over, adult: Secondary | ICD-10-CM | POA: Insufficient documentation

## 2016-01-02 DIAGNOSIS — Z79899 Other long term (current) drug therapy: Secondary | ICD-10-CM | POA: Diagnosis not present

## 2016-01-02 DIAGNOSIS — F1010 Alcohol abuse, uncomplicated: Secondary | ICD-10-CM | POA: Diagnosis present

## 2016-01-02 DIAGNOSIS — F17210 Nicotine dependence, cigarettes, uncomplicated: Secondary | ICD-10-CM | POA: Diagnosis present

## 2016-01-02 DIAGNOSIS — Z833 Family history of diabetes mellitus: Secondary | ICD-10-CM

## 2016-01-02 DIAGNOSIS — I10 Essential (primary) hypertension: Secondary | ICD-10-CM | POA: Diagnosis present

## 2016-01-02 DIAGNOSIS — F419 Anxiety disorder, unspecified: Secondary | ICD-10-CM

## 2016-01-02 DIAGNOSIS — R45851 Suicidal ideations: Secondary | ICD-10-CM

## 2016-01-02 DIAGNOSIS — F410 Panic disorder [episodic paroxysmal anxiety] without agoraphobia: Secondary | ICD-10-CM | POA: Diagnosis present

## 2016-01-02 DIAGNOSIS — F332 Major depressive disorder, recurrent severe without psychotic features: Secondary | ICD-10-CM | POA: Diagnosis present

## 2016-01-02 DIAGNOSIS — Z7984 Long term (current) use of oral hypoglycemic drugs: Secondary | ICD-10-CM | POA: Diagnosis not present

## 2016-01-02 DIAGNOSIS — F17200 Nicotine dependence, unspecified, uncomplicated: Secondary | ICD-10-CM | POA: Diagnosis present

## 2016-01-02 DIAGNOSIS — Z23 Encounter for immunization: Secondary | ICD-10-CM

## 2016-01-02 DIAGNOSIS — M109 Gout, unspecified: Secondary | ICD-10-CM | POA: Diagnosis present

## 2016-01-02 DIAGNOSIS — K219 Gastro-esophageal reflux disease without esophagitis: Secondary | ICD-10-CM | POA: Diagnosis present

## 2016-01-02 DIAGNOSIS — F411 Generalized anxiety disorder: Secondary | ICD-10-CM | POA: Diagnosis present

## 2016-01-02 DIAGNOSIS — E119 Type 2 diabetes mellitus without complications: Secondary | ICD-10-CM | POA: Diagnosis present

## 2016-01-02 DIAGNOSIS — F4321 Adjustment disorder with depressed mood: Secondary | ICD-10-CM | POA: Insufficient documentation

## 2016-01-02 DIAGNOSIS — Z7952 Long term (current) use of systemic steroids: Secondary | ICD-10-CM

## 2016-01-02 DIAGNOSIS — Z883 Allergy status to other anti-infective agents status: Secondary | ICD-10-CM

## 2016-01-02 DIAGNOSIS — Z7982 Long term (current) use of aspirin: Secondary | ICD-10-CM

## 2016-01-02 DIAGNOSIS — Z91030 Bee allergy status: Secondary | ICD-10-CM

## 2016-01-02 LAB — CBC WITH DIFFERENTIAL/PLATELET
BASOS ABS: 0.1 10*3/uL (ref 0–0.1)
BASOS PCT: 1 %
EOS PCT: 4 %
Eosinophils Absolute: 0.3 10*3/uL (ref 0–0.7)
HCT: 43.3 % (ref 40.0–52.0)
Hemoglobin: 14.7 g/dL (ref 13.0–18.0)
Lymphocytes Relative: 31 %
Lymphs Abs: 2.8 10*3/uL (ref 1.0–3.6)
MCH: 30.5 pg (ref 26.0–34.0)
MCHC: 34 g/dL (ref 32.0–36.0)
MCV: 89.7 fL (ref 80.0–100.0)
MONO ABS: 0.8 10*3/uL (ref 0.2–1.0)
MONOS PCT: 9 %
Neutro Abs: 5 10*3/uL (ref 1.4–6.5)
Neutrophils Relative %: 55 %
PLATELETS: 191 10*3/uL (ref 150–440)
RBC: 4.82 MIL/uL (ref 4.40–5.90)
RDW: 15 % — AB (ref 11.5–14.5)
WBC: 9 10*3/uL (ref 3.8–10.6)

## 2016-01-02 LAB — COMPREHENSIVE METABOLIC PANEL
ALT: 68 U/L — ABNORMAL HIGH (ref 17–63)
ANION GAP: 7 (ref 5–15)
AST: 44 U/L — AB (ref 15–41)
Albumin: 4.3 g/dL (ref 3.5–5.0)
Alkaline Phosphatase: 49 U/L (ref 38–126)
BUN: 9 mg/dL (ref 6–20)
CHLORIDE: 106 mmol/L (ref 101–111)
CO2: 25 mmol/L (ref 22–32)
Calcium: 9.3 mg/dL (ref 8.9–10.3)
Creatinine, Ser: 1.32 mg/dL — ABNORMAL HIGH (ref 0.61–1.24)
GFR calc Af Amer: >60 mL/min (ref >60)
Glucose, Bld: 94 mg/dL (ref 65–99)
POTASSIUM: 3.9 mmol/L (ref 3.5–5.1)
Sodium: 138 mmol/L (ref 135–145)
TOTAL PROTEIN: 7.7 g/dL (ref 6.5–8.1)
Total Bilirubin: 0.5 mg/dL (ref 0.3–1.2)

## 2016-01-02 LAB — URINE DRUG SCREEN, QUALITATIVE (ARMC ONLY)
AMPHETAMINES, UR SCREEN: NOT DETECTED
Barbiturates, Ur Screen: NOT DETECTED
Benzodiazepine, Ur Scrn: POSITIVE — AB
CANNABINOID 50 NG, UR ~~LOC~~: NOT DETECTED
COCAINE METABOLITE, UR ~~LOC~~: NOT DETECTED
MDMA (ECSTASY) UR SCREEN: NOT DETECTED
METHADONE SCREEN, URINE: NOT DETECTED
Opiate, Ur Screen: NOT DETECTED
Phencyclidine (PCP) Ur S: NOT DETECTED
TRICYCLIC, UR SCREEN: NOT DETECTED

## 2016-01-02 LAB — ETHANOL

## 2016-01-02 MED ORDER — ALBUTEROL SULFATE HFA 108 (90 BASE) MCG/ACT IN AERS: 2.0000 | INHALATION_SPRAY | RESPIRATORY_TRACT | Status: DC | PRN

## 2016-01-02 MED ORDER — LOSARTAN POTASSIUM 50 MG PO TABS: 100.0000 mg | ORAL_TABLET | Freq: Every day | ORAL | Status: DC

## 2016-01-02 MED ORDER — FAMOTIDINE 20 MG PO TABS
ORAL_TABLET | ORAL | Status: AC
  Administered 2016-01-02: 20 mg via ORAL
  Filled 2016-01-02: qty 1

## 2016-01-02 MED ORDER — METOPROLOL SUCCINATE ER 50 MG PO TB24: 50.0000 mg | ORAL_TABLET | Freq: Every day | ORAL | Status: DC

## 2016-01-02 MED ORDER — MELOXICAM 7.5 MG PO TABS: 7.5000 mg | ORAL_TABLET | Freq: Every day | ORAL | Status: DC

## 2016-01-02 MED ORDER — ALLOPURINOL 100 MG PO TABS: 100.0000 mg | ORAL_TABLET | Freq: Every day | ORAL | Status: DC

## 2016-01-02 MED ORDER — LORAZEPAM 1 MG PO TABS
2.0000 mg | ORAL_TABLET | Freq: Once | ORAL | Status: AC
  Administered 2016-01-02: 2 mg via ORAL
  Filled 2016-01-02: qty 2

## 2016-01-02 MED ORDER — ASPIRIN EC 81 MG PO TBEC: 81.0000 mg | DELAYED_RELEASE_TABLET | Freq: Every day | ORAL | Status: DC

## 2016-01-02 MED ORDER — FAMOTIDINE 20 MG PO TABS
20.0000 mg | ORAL_TABLET | Freq: Two times a day (BID) | ORAL | Status: DC
  Administered 2016-01-02: 20 mg via ORAL

## 2016-01-02 MED ORDER — METFORMIN HCL 500 MG PO TABS: 500.0000 mg | ORAL_TABLET | Freq: Every day | ORAL | Status: DC

## 2016-01-02 NOTE — ED Notes (Signed)
BEHAVIORAL HEALTH ROUNDING Patient sleeping: No. Patient alert and oriented: yes Behavior appropriate: Yes.  ; If no, describe:  Nutrition and fluids offered: Yes  Toileting and hygiene offered: Yes  Sitter present: q 15 min checks Law enforcement present: Yes  

## 2016-01-02 NOTE — BH Assessment (Signed)
Assessment Note  Alan Eaton is an 40 y.o. male who presents to the ER from [place] because of chest pains and HTN. While leaving [facility] he started to report SI. Patient states he is depressed due to his dog dying on yesterday. He had the dog for approximately seven in a half years. Per his report, he's been inpatient several times. His last admission was approximately two years ago. He was admitted due to a suicide attempt. He cut his wrist and had to get sutures.  Patient also reports of having history of alcohol and cannabis use. Currently at [facility] in their [facility] for it. He hasn't used any substances in 6 months.  Patient denies HI and AV/H. He also denies involvement with the legal system DSS.   Diagnosis: Depression                    Alcohol Use Disorder  Past Medical History:  Past Medical History:  Diagnosis Date  . Alcohol abuse   . Bronchitis   . Depression   . Diabetes mellitus without complication ([place])   . GERD (gastroesophageal reflux disease)   . Gout   . Hypertension   . Morbid obesity ([place])   . Suicide ideation     Past Surgical History:  Procedure Laterality Date  . none      Family History:  Family History  Problem Relation Age of Onset  . Hypertension    . Diabetes Mellitus II      Social History:  reports that he has been smoking Cigarettes.  He has been smoking about 0.50 packs per day. He has never used smokeless tobacco. He reports that he drinks alcohol. He reports that he uses drugs, including Marijuana, about 1 time per week.  Additional Social History:  Alcohol / Drug Use Pain Medications: See PTA Prescriptions: See PTA Over the Counter: See PTA History of alcohol / drug use?: Yes Longest period of sobriety (when/how long): "60 days" Negative Consequences of Use: Financial, Personal relationships Withdrawal Symptoms:  (Reports of none) Substance #1 Name of Substance 1: Alcohol 1 - Age of First Use: Teenager 1 - Amount (size/oz): "As  much as I can get" 1 - Frequency: 3 to 4 days a week 1 - Duration: "I really can't say" 1 - Last Use / Amount: 06/2015  CIWA: CIWA-Ar BP: (!) 173/107 Pulse Rate: 90 COWS:    Allergies:  Allergies  Allergen Reactions  . Bee Venom Swelling  . Bactrim [Sulfamethoxazole-Trimethoprim] Swelling and Rash    Home Medications:  (Not in a hospital admission)  OB/GYN Status:  No LMP for male patient.  General Assessment Data Location of Assessment: [facility] ED TTS Assessment: In system Is this a Tele or Face-to-Face Assessment?: Face-to-Face Is this an Initial Assessment or a Re-assessment for this encounter?: Initial Assessment Marital status: Single Maiden name: n/a Is patient pregnant?: No Pregnancy Status: No Living Arrangements: Parent Can pt return to current living arrangement?: Yes Admission Status: Voluntary Is patient capable of signing voluntary admission?: Yes Referral Source: Self/Family/Friend Insurance type: [facility] MCR  Medical Screening Exam ([place]) Medical Exam completed: Yes  Crisis Care Plan Living Arrangements: Parent Legal Guardian: Other: (None) Name of Psychiatrist: Dr. Randel Books ([place]) Name of Therapist: [facility] ([facility])  Education Status Is patient currently in school?: No Current Grade: n/a Highest grade of school patient has completed: [employer] Degree Name of school: n/a Contact person: n/a  Risk to self with the past 6 months Suicidal Ideation:  Yes-Currently Present Has patient been a risk to self within the past 6 months prior to admission? : Yes Suicidal Intent: No-Not Currently/Within Last 6 Months Has patient had any suicidal intent within the past 6 months prior to admission? : No Is patient at risk for suicide?: No Suicidal Plan?: No-Not Currently/Within Last 6 Months Has patient had any suicidal plan within the past 6 months prior to admission? : Yes Access to Means: Yes Specify Access to Suicidal Means: Medications What has been  your use of drugs/alcohol within the last 12 months?: Alcohol and Cannabis Previous Attempts/Gestures: Yes How many times?: 2 Other Self Harm Risks: Reports of none Triggers for Past Attempts: Other personal contacts, Family contact, Other (Comment) (Broke up with girlfriend) Intentional Self Injurious Behavior: None Family Suicide History: No Recent stressful life event(s): Other (Comment) (Dog died) Persecutory voices/beliefs?: No Depression: Yes Depression Symptoms: Feeling worthless/self pity, Loss of interest in usual pleasures, Guilt, Tearfulness, Isolating, Fatigue Substance abuse history and/or treatment for substance abuse?: Yes Suicide prevention information given to non-admitted patients: Not applicable  Risk to Others within the past 6 months Homicidal Ideation: No Does patient have any lifetime risk of violence toward others beyond the six months prior to admission? : No Thoughts of Harm to Others: No Current Homicidal Intent: No Current Homicidal Plan: No Access to Homicidal Means: No Identified Victim: Reports of none History of harm to others?: No Assessment of Violence: None Noted Violent Behavior Description: Reports of none Does patient have access to weapons?: No Criminal Charges Pending?: No Does patient have a court date: No Is patient on probation?: No  Psychosis Hallucinations: None noted Delusions: None noted  Mental Status Report Appearance/Hygiene: In scrubs, Unremarkable Eye Contact: Good Motor Activity: Freedom of movement, Unremarkable Speech: Logical/coherent, Unremarkable Level of Consciousness: Alert Mood: Depressed, Helpless, Sad, Pleasant Affect: Euphoric, Sad Anxiety Level: Minimal Thought Processes: Coherent, Relevant Judgement: Unimpaired Orientation: Person, Place, Time Obsessive Compulsive Thoughts/Behaviors: Minimal  Cognitive Functioning Concentration: Normal Memory: Recent Intact, Remote Intact IQ: Average Insight:  Fair Impulse Control: Poor Appetite: Good Weight Loss: 0 Weight Gain: 0 Sleep: No Change Total Hours of Sleep: 8 Vegetative Symptoms: None  ADLScreening [facility] Assessment Services) Patient's cognitive ability adequate to safely complete daily activities?: Yes Patient able to express need for assistance with ADLs?: Yes Independently performs ADLs?: Yes (appropriate for developmental age)  Prior Inpatient Therapy Prior Inpatient Therapy: Yes Prior Therapy Dates: 10/2012 & other dates are he didn't remember Prior Therapy Facilty/Provider(s): [facility] and facility in [place] Reason for Treatment: Suicidal  Prior Outpatient Therapy Prior Outpatient Therapy: Yes Prior Therapy Dates: Current Prior Therapy Facilty/Provider(s): [facility] Reason for Treatment: [facility] Does patient have an ACCT team?: No Does patient have Intensive In-House Services?  : No Does patient have [facility] services? : No Does patient have P4CC services?: No  ADL Screening (condition at time of admission) Patient's cognitive ability adequate to safely complete daily activities?: Yes Is the patient deaf or have difficulty hearing?: No Does the patient have difficulty seeing, even when wearing glasses/contacts?: No Does the patient have difficulty concentrating, remembering, or making decisions?: No Patient able to express need for assistance with ADLs?: Yes Does the patient have difficulty dressing or bathing?: No Independently performs ADLs?: Yes (appropriate for developmental age) Does the patient have difficulty walking or climbing stairs?: No Weakness of Legs: None Weakness of Arms/Hands: None  Home Assistive Devices/Equipment Home Assistive Devices/Equipment: None  Therapy Consults (therapy consults require a physician order) PT Evaluation Needed: No OT  Evalulation Needed: No SLP Evaluation Needed: No Abuse/Neglect Assessment (Assessment to be complete while patient is alone) Physical Abuse: Denies Verbal Abuse:  Denies Sexual Abuse: Denies Exploitation of patient/patient's resources: Denies Self-Neglect: Denies Values / Beliefs Cultural Requests During Hospitalization: None Spiritual Requests During Hospitalization: None Consults Spiritual Care Consult Needed: No Social Work Consult Needed: No      Additional Information 1:1 In Past 12 Months?: No CIRT Risk: No Elopement Risk: No Does patient have medical clearance?: Yes  Child/Adolescent Assessment Running Away Risk: Denies (Patient is an adult)  Disposition:     On Site Evaluation by:   Reviewed with Physician:    Gunnar Fusi MS, LCAS, [place], [place], CCSI Therapeutic Triage Specialist 01/02/2016 6:44 PM

## 2016-01-02 NOTE — ED Triage Notes (Signed)
Reports trying to go to [place] today but sent here because of htn

## 2016-01-02 NOTE — ED Notes (Signed)
Pt initially denied SI. Pt told RN that he had to put his dog down yesterday and that he would like to be with his dog. Pt denied having a plan but confirmed he had thoughts of hurting himself in order to be with his dog.

## 2016-01-02 NOTE — ED Provider Notes (Addendum)
[facility] Emergency Department Provider Note        Time seen: ----------------------------------------- 3:19 PM on 01/02/2016 -----------------------------------------    I have reviewed the triage vital signs and the nursing notes.   HISTORY  Chief Complaint Hypertension    HPI Alan Eaton is a 40 y.o. male who presents to the ER for evaluation regarding suicidal ideation. Patient states his dog died and he is having thoughts of wanting to be with the dog. Patient used to have a drinking disorder but is now sober due to treatment by [facility]. He denies recent illness or changes in his medicines. Patient states he feels very anxious, currently does not have suicidal thoughts but has had them recently.   Past Medical History:  Diagnosis Date  . Alcohol abuse   . Bronchitis   . Depression   . Diabetes mellitus without complication ([place])   . GERD (gastroesophageal reflux disease)   . Gout   . Hypertension   . Morbid obesity ([place])   . Suicide ideation     Patient Active Problem List   Diagnosis Date Noted  . Obesity 10/27/2015  . Alcohol withdrawal ([place]) 10/27/2015  . Orchitis of right testicle 08/10/2015  . Overdose of sedative or hypnotic 05/26/2015  . Alcohol abuse 05/26/2015  . Substance induced mood disorder ([place]) 05/26/2015  . Severe episode of recurrent major depressive disorder, without psychotic features ([place])   . Alcohol dependence with intoxication with complication ([place])   . Overdose 05/23/2015  . Sepsis ([place]) 01/23/2015    Past Surgical History:  Procedure Laterality Date  . none      Allergies Bee venom and Bactrim [sulfamethoxazole-trimethoprim]  Social History Social History  Substance Use Topics  . Smoking status: Current Every Day Smoker    Packs/day: 0.50    Types: Cigarettes  . Smokeless tobacco: Never Used  . Alcohol use 0.0 oz/week     Comment: occasional    Review of Systems Constitutional: Negative for  fever. Cardiovascular: Negative for chest pain. Respiratory: Negative for shortness of breath. Gastrointestinal: Negative for abdominal pain, vomiting and diarrhea. Genitourinary: Negative for dysuria. Musculoskeletal: Negative for back pain. Skin: Negative for rash. Neurological: Negative for headaches, focal weakness or numbness. Psychiatric: Positive for suicidal ideation, anxiety  10-point ROS otherwise negative.  ____________________________________________   PHYSICAL EXAM:  VITAL SIGNS: ED Triage Vitals  Enc Vitals Group     BP 01/02/16 1444 (!) 173/107     Pulse Rate 01/02/16 1444 90     Resp 01/02/16 1444 20     Temp 01/02/16 1444 97.9 F (36.6 C)     Temp Source 01/02/16 1444 Oral     SpO2 01/02/16 1444 95 %     Weight 01/02/16 1436 (!) 331 lb (150.1 kg)     Height 01/02/16 1436 6\' 1"  (1.854 m)     Head Circumference --      Peak Flow --      Pain Score --      Pain Loc --      Pain Edu? --      Excl. in [place]? --     Constitutional: Alert and oriented. Anxious, no acute distress Eyes: Conjunctivae are normal. PERRL. Normal extraocular movements. ENT   Head: Normocephalic and atraumatic.   Nose: No congestion/rhinnorhea.   Mouth/Throat: Mucous membranes are moist.   Neck: No stridor. Cardiovascular: Normal rate, regular rhythm. No murmurs, rubs, or gallops. Respiratory: Normal respiratory effort without tachypnea nor retractions. Breath sounds are  clear and equal bilaterally. No wheezes/rales/rhonchi. Gastrointestinal: Soft and nontender. Normal bowel sounds Musculoskeletal: Nontender with normal range of motion in all extremities. No lower extremity tenderness nor edema. Neurologic:  Normal speech and language. No gross focal neurologic deficits are appreciated.  Skin:  Skin is warm, dry and intact. No rash noted. Psychiatric: Anxious mood and affect ____________________________________________  EKG: Interpreted by me. Sinus rhythm rate of 77  bpm, normal PR interval, normal QRS, normal QT intervals. Normal axis.  ____________________________________________  ED COURSE:  Pertinent labs & imaging results that were available during my care of the patient were reviewed by me and considered in my medical decision making (see chart for details). Clinical Course  Patient is in mild distress from anxiety and suicidal ideation.  Procedures ____________________________________________   LABS (pertinent positives/negatives)  Labs Reviewed  CBC WITH DIFFERENTIAL/PLATELET - Abnormal; Notable for the following:       Result Value   RDW 15.0 (*)    All other components within normal limits  COMPREHENSIVE METABOLIC PANEL - Abnormal; Notable for the following:    Creatinine, Ser 1.32 (*)    AST 44 (*)    ALT 68 (*)    All other components within normal limits  URINE DRUG SCREEN, QUALITATIVE ([facility] ONLY) - Abnormal; Notable for the following:    Benzodiazepine, Ur Scrn POSITIVE (*)    All other components within normal limits  ETHANOL    ____________________________________________  FINAL ASSESSMENT AND PLAN  Suicidal ideation, anxiety  Plan: Patient with labs as dictated above. Patient was given oral Ativan to treat his anxiety at this time. We will consult psychiatry for evaluation. Currently he is medically clear for psychiatric evaluation. Patient care checked out to Dr. Marcelene [place] for final disposition.    Earleen Newport, MD   Note: This dictation was prepared with Dragon dictation. Any transcriptional errors that result from this process are unintentional    Earleen Newport, MD 01/02/16 1551    Earleen Newport, MD 01/02/16 1728

## 2016-01-02 NOTE — ED Notes (Signed)
First call for report, RN to call back per [facility]

## 2016-01-02 NOTE — Consult Note (Signed)
[facility] Face-to-Face Psychiatry Consult   Reason for Consult:  Consult for this 40 year old man with a history of alcohol abuse and mood disorder who is now claiming to be suicidal Referring Physician: Jimmye Norman Patient Identification: Alan Eaton MRN:  [id number] Principal Diagnosis: Adjustment disorder with depressed mood Diagnosis:   Patient Active Problem List   Diagnosis Date Noted  . Adjustment disorder with depressed mood [F43.21] 01/02/2016  . Obesity [E66.9] 10/27/2015  . Alcohol withdrawal ([place]) [F10.239] 10/27/2015  . Orchitis of right testicle [N45.2] 08/10/2015  . Overdose of sedative or hypnotic [T42.71XA] 05/26/2015  . Alcohol abuse [F10.10] 05/26/2015  . Substance induced mood disorder ([place]) [F19.94] 05/26/2015  . Severe episode of recurrent major depressive disorder, without psychotic features ([place]) [F33.2]   . Alcohol dependence with intoxication with complication ([place]) [N56.213]   . Overdose [T50.901A] 05/23/2015  . Sepsis ([place]) [A41.9] 01/23/2015    Total Time spent with patient: 1 hour  Subjective:   Alan Eaton is a 40 y.o. male patient admitted with "I'm not doing too good today".  HPI:  This is a 40 year old man with a history of alcohol abuse and mood symptoms. He was sent to the hospital by [facility] today because of high blood pressure and chest pain. Those problems of been evaluated and taking care of him and then he said that he was having suicidal thoughts. Apparently his dog had to be put down by the vet because of chronic illness today. He feels like he is so overwhelmed with sadness that he can't go on anymore. He claims multiple times that if we send him back home that he will probably just keep calling mobile crisis and having himself brought to the emergency room because otherwise he thinks he might kill himself. Denies homicidal ideation. Denies any psychosis or hallucinations. He claims that he has been going regularly to [place] for outpatient substance  abuse treatment and that he has not had any alcohol or drugs in 60 days. He does not report any other particular stressor.  Social history: Lives with his mother. Goes to [place] 3 days a week for intensive outpatient. Doesn't have much other activity.  Medical history: Morbid obesity. Mild diabetes. Hypertension. Gout  Substance abuse history: Long-standing problem with alcohol abuse at times needing detox. He says he has now been off of alcohol for 2 months. Despite that he tells me that what he really wants is for [place] to keep him in the hospital for "those 28 days that you do here"  Past Psychiatric History: Patient has had several presentations to the psychiatric service in the past usually in the context of his ongoing alcohol abuse. He says he does have a history of 1 suicide attempt in the past. He apparently is not taking any antidepressant medicine according to his medicine list right now. He doesn't think that medicines have ever been clearly helpful. Denies any history of psychosis.  Risk to Self: Is patient at risk for suicide?: No Risk to Others:   Prior Inpatient Therapy:   Prior Outpatient Therapy:    Past Medical History:  Past Medical History:  Diagnosis Date  . Alcohol abuse   . Bronchitis   . Depression   . Diabetes mellitus without complication ([place])   . GERD (gastroesophageal reflux disease)   . Gout   . Hypertension   . Morbid obesity ([place])   . Suicide ideation     Past Surgical History:  Procedure Laterality Date  . none     Family  History:  Family History  Problem Relation Age of Onset  . Hypertension    . Diabetes Mellitus II     Family Psychiatric  History: Positive for depression Social History:  History  Alcohol Use  . 0.0 oz/week    Comment: occasional     History  Drug Use  . Frequency: 1.0 time per week  . Types: Marijuana    Social History   Social History  . Marital status: Divorced    Spouse name: N/A  . Number of children: N/A  .  Years of education: N/A   Social History Main Topics  . Smoking status: Current Every Day Smoker    Packs/day: 0.50    Types: Cigarettes  . Smokeless tobacco: Never Used  . Alcohol use 0.0 oz/week     Comment: occasional  . Drug use:     Frequency: 1.0 time per week    Types: Marijuana  . Sexual activity: Not Asked   Other Topics Concern  . None   Social History Narrative  . None   Additional Social History:    Allergies:   Allergies  Allergen Reactions  . Bee Venom Swelling  . Bactrim [Sulfamethoxazole-Trimethoprim] Swelling and Rash    Labs:  Results for orders placed or performed during the hospital encounter of 01/02/16 (from the past 48 hour(s))  CBC with Differential/Platelet     Status: Abnormal   Collection Time: 01/02/16  4:19 PM  Result Value Ref Range   WBC 9.0 3.8 - 10.6 K/uL   RBC 4.82 4.40 - 5.90 MIL/uL   Hemoglobin 14.7 13.0 - 18.0 g/dL   HCT 43.3 40.0 - 52.0 %   MCV 89.7 80.0 - 100.0 fL   MCH 30.5 26.0 - 34.0 pg   MCHC 34.0 32.0 - 36.0 g/dL   RDW 15.0 (H) 11.5 - 14.5 %   Platelets 191 150 - 440 K/uL   Neutrophils Relative % 55 %   Neutro Abs 5.0 1.4 - 6.5 K/uL   Lymphocytes Relative 31 %   Lymphs Abs 2.8 1.0 - 3.6 K/uL   Monocytes Relative 9 %   Monocytes Absolute 0.8 0.2 - 1.0 K/uL   Eosinophils Relative 4 %   Eosinophils Absolute 0.3 0 - 0.7 K/uL   Basophils Relative 1 %   Basophils Absolute 0.1 0 - 0.1 K/uL  Comprehensive metabolic panel     Status: Abnormal   Collection Time: 01/02/16  4:19 PM  Result Value Ref Range   Sodium 138 135 - 145 mmol/L   Potassium 3.9 3.5 - 5.1 mmol/L   Chloride 106 101 - 111 mmol/L   CO2 25 22 - 32 mmol/L   Glucose, Bld 94 65 - 99 mg/dL   BUN 9 6 - 20 mg/dL   Creatinine, Ser 1.32 (H) 0.61 - 1.24 mg/dL   Calcium 9.3 8.9 - 10.3 mg/dL   Total Protein 7.7 6.5 - 8.1 g/dL   Albumin 4.3 3.5 - 5.0 g/dL   AST 44 (H) 15 - 41 U/L   ALT 68 (H) 17 - 63 U/L   Alkaline Phosphatase 49 38 - 126 U/L   Total Bilirubin  0.5 0.3 - 1.2 mg/dL   GFR calc non Af Amer >60 >60 mL/min   GFR calc Af Amer >60 >60 mL/min    Comment: (NOTE) The eGFR has been calculated using the CKD EPI equation. This calculation has not been validated in all clinical situations. eGFR's persistently <60 mL/min signify possible Chronic Kidney Disease.  Anion gap 7 5 - 15  Ethanol     Status: None   Collection Time: 01/02/16  4:19 PM  Result Value Ref Range   Alcohol, Ethyl (B) <5 <5 mg/dL    Comment:        LOWEST DETECTABLE LIMIT FOR SERUM ALCOHOL IS 5 mg/dL FOR MEDICAL PURPOSES ONLY     Current Facility-Administered Medications  Medication Dose Route Frequency Provider Last Rate Last Dose  . albuterol (PROVENTIL HFA;VENTOLIN HFA) 108 (90 Base) MCG/ACT inhaler 2 puff  2 puff Inhalation Q4H PRN Gonzella Lex, MD      . allopurinol (ZYLOPRIM) tablet 100 mg  100 mg Oral Daily Gonzella Lex, MD      . aspirin EC tablet 81 mg  81 mg Oral Daily Gonzella Lex, MD      . famotidine (PEPCID) tablet 20 mg  20 mg Oral BID Gonzella Lex, MD      . losartan (COZAAR) tablet 100 mg  100 mg Oral Daily Gonzella Lex, MD      . meloxicam (MOBIC) tablet 7.5 mg  7.5 mg Oral Daily Gonzella Lex, MD      . Derrill Memo ON 01/03/2016] metFORMIN (GLUCOPHAGE) tablet 500 mg  500 mg Oral Q breakfast Gonzella Lex, MD      . metoprolol succinate (TOPROL-XL) 24 hr tablet 50 mg  50 mg Oral Daily Gonzella Lex, MD       Current Outpatient Prescriptions  Medication Sig Dispense Refill  . Alcohol Swabs PADS 1 each by Does not apply route 2 (two) times daily. 100 each 12  . allopurinol (ZYLOPRIM) 100 MG tablet Take 100 mg by mouth daily.    Marland Kitchen allopurinol (ZYLOPRIM) 300 MG tablet Take 300 mg by mouth daily.    Marland Kitchen aspirin EC 81 MG tablet Take 81 mg by mouth daily.    . B Complex-C (B-COMPLEX WITH VITAMIN C) tablet Take 1 tablet by mouth daily.    . benzonatate (TESSALON PERLES) 100 MG capsule Take 2 capsules (200 mg total) by mouth 3 (three) times daily  as needed for cough. 40 capsule 0  . chlordiazePOXIDE (LIBRIUM) 25 MG capsule 50 mg 3 times a day x 3 days 50 mg 2 times a day for 3 days 50 mg once a day for 3 days stop 35 capsule 0  . colchicine 0.6 MG tablet Take 0.6 mg by mouth daily as needed.     . dicyclomine (BENTYL) 20 MG tablet Take 20 mg by mouth daily.    . famotidine (PEPCID) 40 MG tablet Take 40 mg by mouth at bedtime.    Marland Kitchen glucose blood (ACCU-CHEK AVIVA PLUS) test strip 1 each by Other route 2 (two) times daily. Use as instructed 100 each 12  . Lancet Devices (ACCU-CHEK SOFTCLIX) lancets 1 each by Other route 2 (two) times daily. Use as instructed 1 each 12  . losartan (COZAAR) 100 MG tablet Take 100 mg by mouth daily.    . meloxicam (MOBIC) 7.5 MG tablet Take 7.5 mg by mouth daily.    . metFORMIN (GLUCOPHAGE) 500 MG tablet TAKE 1 TABLET BY MOUTH ONCE DAILY AFTER A MEAL. 90 tablet 3  . metoprolol succinate (TOPROL-XL) 50 MG 24 hr tablet Take 50 mg by mouth daily. Take with or immediately following a meal.    . Multiple Vitamins-Minerals (MULTIVITAMIN WITH MINERALS) tablet Take 1 tablet by mouth daily.    Marland Kitchen omeprazole (PRILOSEC) 20 MG capsule Take 20  mg by mouth 2 (two) times daily.    . predniSONE (DELTASONE) 10 MG tablet Take 3 tablets once a day for 3 days. 9 tablet 0  . promethazine (PHENERGAN) 25 MG tablet TAKE 1 TABLET BY MOUTH TWICE DAILY 60 tablet 6  . VENTOLIN HFA 108 (90 Base) MCG/ACT inhaler INHALE 2 PUFFS BY MOUTH EVERY 4 TO 6 HOURS AS NEEDED. 18 g 12    Musculoskeletal: Strength & Muscle Tone: within normal limits Gait & Station: normal Patient leans: N/A  Psychiatric Specialty Exam: Physical Exam  Nursing note and vitals reviewed. Constitutional: He appears well-developed and well-nourished.    HENT:  Head: Normocephalic and atraumatic.  Eyes: Conjunctivae are normal. Pupils are equal, round, and reactive to light.  Neck: Normal range of motion.  Cardiovascular: Regular rhythm and normal heart sounds.    Respiratory: Effort normal.  GI: Soft.  Musculoskeletal: Normal range of motion.  Neurological: He is alert.  Skin: Skin is warm and dry.  Psychiatric: His speech is normal and behavior is normal. Cognition and memory are normal. He expresses impulsivity. He exhibits a depressed mood. He expresses suicidal ideation.    Review of Systems  Constitutional: Negative.   HENT: Negative.   Eyes: Negative.   Respiratory: Negative.   Cardiovascular: Negative.   Gastrointestinal: Negative.   Musculoskeletal: Negative.   Skin: Negative.   Neurological: Negative.   Psychiatric/Behavioral: Positive for depression and suicidal ideas. Negative for hallucinations, memory loss and substance abuse. The patient is nervous/anxious. The patient does not have insomnia.     Blood pressure (!) 173/107, pulse 90, temperature 97.9 F (36.6 C), temperature source Oral, resp. rate 20, height 6' 1"  (1.854 m), weight (!) 150.1 kg (331 lb), SpO2 95 %.Body mass index is 43.67 kg/m.  General Appearance: Casual  Eye Contact:  Minimal  Speech:  Normal Rate  Volume:  Decreased  Mood:  Depressed  Affect:  Appropriate  Thought Process:  Goal Directed  Orientation:  Full (Time, Place, and Person)  Thought Content:  Logical  Suicidal Thoughts:  Yes.  with intent/plan  Homicidal Thoughts:  No  Memory:  Immediate;   Good Recent;   Fair Remote;   Fair  Judgement:  Impaired  Insight:  Shallow  Psychomotor Activity:  Normal  Concentration:  Concentration: Fair  Recall:  [employer] of Knowledge:  Fair  Language:  Fair  Akathisia:  No  Handed:  Right  AIMS (if indicated):     Assets:  Desire for Improvement Housing Social Support  ADL's:  Intact  Cognition:  Impaired,  Mild  Sleep:        Treatment Plan Summary: Daily contact with patient to assess and evaluate symptoms and progress in treatment, Medication management and Plan This is a 40 year old man who is coming to our emergency room claiming that he  is actively suicidal if he is not admitted to the hospital on account of his dog having to be put to sleep at the vet. He doesn't look all that sad and upset. He is persistent however and insisting that he will keep calling mobile crisis if we don't admit him to the hospital. I'm not sure if there is some kind of secondary gain going on here but it's not obvious. I will go ahead and put in admission orders to admit him to the hospital. Continue outpatient medicine. We will try and get some labs done. Case reviewed with TTS and emergency room physician. Usual 15 minute checks in place. I'm  not adding any other psychiatric medicine right now since he is claiming that he had not been depressed up until his dog being put to sleep today.  Disposition: Recommend psychiatric Inpatient admission when medically cleared. Supportive therapy provided about ongoing stressors.  Alethia Berthold, MD 01/02/2016 5:04 PM

## 2016-01-03 DIAGNOSIS — F4321 Adjustment disorder with depressed mood: Principal | ICD-10-CM

## 2016-01-03 LAB — LIPID PANEL
CHOL/HDL RATIO: 3.9 ratio
CHOLESTEROL: 140 mg/dL (ref 0–200)
HDL: 36 mg/dL — AB (ref >40)
LDL Cholesterol: 77 mg/dL (ref 0–99)
Triglycerides: 133 mg/dL (ref <150)
VLDL: 27 mg/dL (ref 0–40)

## 2016-01-03 LAB — TSH: TSH: 2.377 u[IU]/mL (ref 0.350–4.500)

## 2016-01-03 MED ORDER — PNEUMOCOCCAL VAC POLYVALENT 25 MCG/0.5ML IJ INJ
0.5000 mL | INJECTION | INTRAMUSCULAR | Status: AC
  Administered 2016-01-04: 0.5 mL via INTRAMUSCULAR
  Filled 2016-01-03: qty 0.5

## 2016-01-03 MED ORDER — METOPROLOL SUCCINATE ER 50 MG PO TB24
50.0000 mg | ORAL_TABLET | Freq: Every day | ORAL | Status: DC
  Administered 2016-01-03 – 2016-01-05 (×3): 50 mg via ORAL
  Filled 2016-01-03 (×3): qty 1

## 2016-01-03 MED ORDER — INFLUENZA VAC SPLIT QUAD 0.5 ML IM SUSY
0.5000 mL | PREFILLED_SYRINGE | INTRAMUSCULAR | Status: AC
  Administered 2016-01-04: 0.5 mL via INTRAMUSCULAR
  Filled 2016-01-03: qty 0.5

## 2016-01-03 MED ORDER — ALUM & MAG HYDROXIDE-SIMETH 200-200-20 MG/5ML PO SUSP: 30.0000 mL | ORAL | Status: DC | PRN

## 2016-01-03 MED ORDER — ALBUTEROL SULFATE HFA 108 (90 BASE) MCG/ACT IN AERS
2.0000 | INHALATION_SPRAY | RESPIRATORY_TRACT | Status: DC | PRN
  Administered 2016-01-04: 2 via RESPIRATORY_TRACT
  Filled 2016-01-03: qty 6.7

## 2016-01-03 MED ORDER — ACETAMINOPHEN 325 MG PO TABS: 650.0000 mg | ORAL_TABLET | Freq: Four times a day (QID) | ORAL | Status: DC | PRN

## 2016-01-03 MED ORDER — FAMOTIDINE 20 MG PO TABS
20.0000 mg | ORAL_TABLET | Freq: Two times a day (BID) | ORAL | Status: DC
  Administered 2016-01-03 – 2016-01-05 (×5): 20 mg via ORAL
  Filled 2016-01-03 (×5): qty 1

## 2016-01-03 MED ORDER — ALLOPURINOL 100 MG PO TABS
100.0000 mg | ORAL_TABLET | Freq: Every day | ORAL | Status: DC
  Administered 2016-01-03 – 2016-01-05 (×3): 100 mg via ORAL
  Filled 2016-01-03 (×3): qty 1

## 2016-01-03 MED ORDER — HYDROXYZINE HCL 25 MG PO TABS: 25.0000 mg | ORAL_TABLET | Freq: Three times a day (TID) | ORAL | Status: DC | PRN

## 2016-01-03 MED ORDER — METFORMIN HCL 500 MG PO TABS
500.0000 mg | ORAL_TABLET | Freq: Every day | ORAL | Status: DC
  Administered 2016-01-03 – 2016-01-05 (×3): 500 mg via ORAL
  Filled 2016-01-03 (×3): qty 1

## 2016-01-03 MED ORDER — TRAZODONE HCL 100 MG PO TABS: 100.0000 mg | ORAL_TABLET | Freq: Every evening | ORAL | Status: DC | PRN

## 2016-01-03 MED ORDER — LOSARTAN POTASSIUM 25 MG PO TABS
100.0000 mg | ORAL_TABLET | Freq: Every day | ORAL | Status: DC
  Administered 2016-01-03 – 2016-01-05 (×3): 100 mg via ORAL
  Filled 2016-01-03 (×3): qty 4

## 2016-01-03 MED ORDER — ASPIRIN EC 81 MG PO TBEC
81.0000 mg | DELAYED_RELEASE_TABLET | Freq: Every day | ORAL | Status: DC
  Administered 2016-01-03 – 2016-01-05 (×3): 81 mg via ORAL
  Filled 2016-01-03 (×3): qty 1

## 2016-01-03 MED ORDER — MAGNESIUM HYDROXIDE 400 MG/5ML PO SUSP: 30.0000 mL | Freq: Every day | ORAL | Status: DC | PRN

## 2016-01-03 MED ORDER — MELOXICAM 7.5 MG PO TABS
7.5000 mg | ORAL_TABLET | Freq: Every day | ORAL | Status: DC
  Administered 2016-01-03 – 2016-01-05 (×3): 7.5 mg via ORAL
  Filled 2016-01-03 (×3): qty 1

## 2016-01-03 NOTE — [facility] Group Notes (Signed)
[facility] Group Notes:  (Nursing/MHT/Case Management/Adjunct)  Date:  01/03/2016  Time:  9:39 PM  Type of Therapy:  Evening Wrap-up Group  Participation Level:  Did Not Attend  Participation Quality:  N/A  Affect:  N/A  Cognitive:  N/A  Insight:  None  Engagement in Group:  None  Modes of Intervention:  Discussion  Summary of Progress/Problems:  Levonne Spiller 01/03/2016, 9:39 PM

## 2016-01-03 NOTE — Tx Team (Signed)
Initial Treatment Plan 01/03/2016 2:00 AM Alan Eaton [id number]    PATIENT STRESSORS: Loss of Dog and Best Friend   PATIENT STRENGTHS: Ability for insight Average or above average intelligence Communication skills   PATIENT IDENTIFIED PROBLEMS:   SI  Ineffective individual coping      "Down because I lost my dog and my best friend"           DISCHARGE CRITERIA:  Improved stabilization in mood, thinking, and/or behavior  PRELIMINARY DISCHARGE PLAN: Return to previous living arrangement  PATIENT/FAMILY INVOLVEMENT: This treatment plan has been presented to and reviewed with the patient, Alan Eaton, and/or family member,.  The patient and family have been given the opportunity to ask questions and make suggestions.  Amie Portland, RN 01/03/2016, 2:00 AM

## 2016-01-03 NOTE — [facility] Suicide Risk Assessment (Signed)
[place] INPATIENT:  Family/Significant Other Suicide Prevention Education  Suicide Prevention Education:  Education Completed; Melburn Popper, Mother, [phone],  (name of family member/significant other) has been identified by the patient as the family member/significant other with whom the patient will be residing, and identified as the person(s) who will aid the patient in the event of a mental health crisis (suicidal ideations/suicide attempt).  With written consent from the patient, the family member/significant other has been provided the following suicide prevention education, prior to the and/or following the discharge of the patient.  The suicide prevention education provided includes the following:  Suicide risk factors  Suicide prevention and interventions  National Suicide Hotline telephone number  [facility] assessment telephone number  [facility] Emergency Assistance [place] and/or Residential Mobile Crisis Unit telephone number  Request made of family/significant other to:  Remove weapons (e.g., guns, rifles, knives), all items previously/currently identified as safety concern.    Remove drugs/medications (over-the-counter, prescriptions, illicit drugs), all items previously/currently identified as a safety concern.  The family member/significant other verbalizes understanding of the suicide prevention education information provided.  The family member/significant other agrees to remove the items of safety concern listed above.   Carloyn Jaeger Lyfe Monger 01/03/2016, 4:13 PM

## 2016-01-03 NOTE — Progress Notes (Addendum)
Patient with sad affect, cooperative behavior with meals, meds and plan of care. At times patient is intrusive with staff and sarcastic when limit is set. Patient continues to make requests for candy and cologne from locker, and states " I know I've been here before", when limit is set. Overly friendly with staff at times and limit set. Patient's Mother phones requesting what clothes items she can bring in. No SI/HI at this time. Safety maintained. Provided a patient with information on positive coping skills rt death of a pet. Patient dog was put down yesterday. Patient's Mother into visit, drops off clothes. Patient showers and safety maintained. No distress, no complaint.

## 2016-01-03 NOTE — H&P (Signed)
Psychiatric Admission Assessment Adult  Patient Identification: Alan Eaton MRN:  [id number] Date of Evaluation:  01/03/2016 Chief Complaint:  Adjustment Disorder with Depressed Mood Adjustment Disorder with Depressed Mood Diagnosis:   Patient Active Problem List   Diagnosis Date Noted  . Adjustment disorder with depressed mood [F43.21] 01/02/2016  . Obesity [E66.9] 10/27/2015  . Alcohol withdrawal ([place]) [F10.239] 10/27/2015  . Orchitis of right testicle [N45.2] 08/10/2015  . Overdose of sedative or hypnotic [T42.71XA] 05/26/2015  . Alcohol abuse [F10.10] 05/26/2015  . Substance induced mood disorder ([place]) [F19.94] 05/26/2015  . Severe episode of recurrent major depressive disorder, without psychotic features ([place]) [F33.2]   . Alcohol dependence with intoxication with complication ([place]) 0000000   . Overdose [T50.901A] 05/23/2015  . Sepsis ([place]) [A41.9] 01/23/2015   History of Present Illness: This is a 40 year old Are greatly obese male with history of alcohol abuse and mood symptoms who presented to the hospital from [facility] and to get medical clearance. Patient reported that he has been attending group therapy for the past 2 months and has been doing well. He reported that he has been taking care of himself and then he was evaluated he expressed suicidal thoughts.  He was feeling depressed because his dog of 7 years was put down as he was sick.   During my interview patient reported that he was feeling sad and overwhelmed and thought that he cannot go through it anymore. He reported in the ER multiple times that if he would be sent back home he will not be able to handle this crisis. . Denies homicidal ideation. Denies any psychosis or hallucinations. He claims that he has been going regularly to [place] for outpatient substance abuse treatment and that he has not had any alcohol or drugs in 60 days. He does not report any other particular stressor. During my interview patient reported that  he wants to be transferred to the medical floor as he thinks that he is doing better and he came here only for his blood pressure check.  He denied having any perceptual disturbances. He reported that he is sober now.   Associated Signs/Symptoms: Depression Symptoms:  depressed mood, psychomotor retardation, fatigue, feelings of worthlessness/guilt, anxiety, panic attacks, disturbed sleep, weight gain, (Hypo) Manic Symptoms:  Impulsivity, Labiality of Mood, Anxiety Symptoms:  Excessive Worry, Psychotic Symptoms:  none PTSD Symptoms: Negative NA Total Time spent with patient: 1 hour  Past Psychiatric History:   Patient has had several presentations to the psychiatric service in the past usually in the context of his ongoing alcohol abuse. He says he does have a history of 1 suicide attempt in the past. He apparently is not taking any antidepressant medicine according to his medicine list right now. He doesn't think that medicines have ever been clearly helpful. Denies any history of psychosis  Is the patient at risk to self? Yes.    Has the patient been a risk to self in the past 6 months? No.  Has the patient been a risk to self within the distant past? No.  Is the patient a risk to others? No.  Has the patient been a risk to others in the past 6 months? No.  Has the patient been a risk to others within the distant past? No.   Prior Inpatient Therapy:   Prior Outpatient Therapy:    Alcohol Screening: 1. How often do you have a drink containing alcohol?: Never 3. How often do you have six or more drinks on one occasion?:  Never 4. How often during the last year have you found that you were not able to stop drinking once you had started?: Never 5. How often during the last year have you failed to do what was normally expected from you becasue of drinking?: Never 6. How often during the last year have you needed a first drink in the morning to get yourself going after a heavy  drinking session?: Never 7. How often during the last year have you had a feeling of guilt of remorse after drinking?: Never 8. How often during the last year have you been unable to remember what happened the night before because you had been drinking?: Never 9. Have you or someone else been injured as a result of your drinking?: No 10. Has a relative or friend or a doctor or another health worker been concerned about your drinking or suggested you cut down?: No Alcohol Use Disorder Identification Test Final Score (AUDIT): 0 Brief Intervention: AUDIT score less than 7 or less-screening does not suggest unhealthy drinking-brief intervention not indicated Substance Abuse History in the last 12 months:  Yes.   Consequences of Substance Abuse: Negative NA Previous Psychotropic Medications:  Psychological Evaluations: Past Medical History:  Past Medical History:  Diagnosis Date  . Alcohol abuse   . Bronchitis   . Depression   . Diabetes mellitus without complication ([place])   . GERD (gastroesophageal reflux disease)   . Gout   . Hypertension   . Morbid obesity ([place])   . Suicide ideation     Past Surgical History:  Procedure Laterality Date  . none     Family History:  Family History  Problem Relation Age of Onset  . Hypertension    . Diabetes Mellitus II     Family Psychiatric  History: alcohol  Tobacco Screening: Have you used any form of tobacco in the last 30 days? (Cigarettes, Smokeless Tobacco, Cigars, and/or Pipes): Yes Tobacco use, Select all that apply: 5 or more cigarettes per day Are you interested in Tobacco Cessation Medications?: No, patient refused Counseled patient on smoking cessation including recognizing danger situations, developing coping skills and basic information about quitting provided: Refused/Declined practical counseling Social History:  History  Alcohol Use  . 0.0 oz/week    Comment: occasional     History  Drug Use  . Frequency: 1.0 time per  week  . Types: Marijuana    Additional Social History: Marital status: Single Are you sexually active?: No What is your sexual orientation?: Heterosexual Has your sexual activity been affected by drugs, alcohol, medication, or emotional stress?: No Does patient have children?: Yes How is patient's relationship with their children?: Hasn't seen them, doesn't really want to talk about it.                         Allergies:   Allergies  Allergen Reactions  . Bee Venom Swelling  . Bactrim [Sulfamethoxazole-Trimethoprim] Swelling and Rash   Lab Results:  Results for orders placed or performed during the hospital encounter of 01/02/16 (from the past 48 hour(s))  Lipid panel     Status: Abnormal   Collection Time: 01/03/16  6:51 AM  Result Value Ref Range   Cholesterol 140 0 - 200 mg/dL   Triglycerides 133 <150 mg/dL   HDL 36 (L) >40 mg/dL   Total CHOL/HDL Ratio 3.9 RATIO   VLDL 27 0 - 40 mg/dL   LDL Cholesterol 77 0 - 99 mg/dL  Comment:        Total Cholesterol/HDL:CHD Risk Coronary Heart Disease Risk Table                     Men   Women  1/2 Average Risk   3.4   3.3  Average Risk       5.0   4.4  2 X Average Risk   9.6   7.1  3 X Average Risk  23.4   11.0        Use the calculated Patient Ratio above and the CHD Risk Table to determine the patient's CHD Risk.        ATP III CLASSIFICATION (LDL):  <100     mg/dL   Optimal  100-129  mg/dL   Near or Above                    Optimal  130-159  mg/dL   Borderline  160-189  mg/dL   High  >190     mg/dL   Very High   TSH     Status: None   Collection Time: 01/03/16  6:51 AM  Result Value Ref Range   TSH 2.377 0.350 - 4.500 uIU/mL    Comment: Performed by a 3rd Generation assay with a functional sensitivity of <=0.01 uIU/mL.    Blood Alcohol level:  Lab Results  Component Value Date   ETH <5 01/02/2016   ETH 198 (H) [id number]    Metabolic Disorder Labs:  Lab Results  Component Value Date   HGBA1C 6.4  (H) 01/23/2015   No results found for: PROLACTIN Lab Results  Component Value Date   CHOL 140 01/03/2016   TRIG 133 01/03/2016   HDL 36 (L) 01/03/2016   CHOLHDL 3.9 01/03/2016   VLDL 27 01/03/2016   LDLCALC 77 01/03/2016    Current Medications: Current Facility-Administered Medications  Medication Dose Route Frequency Provider Last Rate Last Dose  . acetaminophen (TYLENOL) tablet 650 mg  650 mg Oral Q6H PRN Gonzella Lex, MD      . albuterol (PROVENTIL HFA;VENTOLIN HFA) 108 (90 Base) MCG/ACT inhaler 2 puff  2 puff Inhalation Q4H PRN Gonzella Lex, MD      . allopurinol (ZYLOPRIM) tablet 100 mg  100 mg Oral Daily Gonzella Lex, MD   100 mg at 01/03/16 0838  . alum & mag hydroxide-simeth (MAALOX/MYLANTA) 200-200-20 MG/5ML suspension 30 mL  30 mL Oral Q4H PRN Gonzella Lex, MD      . aspirin EC tablet 81 mg  81 mg Oral Daily Gonzella Lex, MD   81 mg at 01/03/16 0837  . famotidine (PEPCID) tablet 20 mg  20 mg Oral BID Gonzella Lex, MD   20 mg at 01/03/16 [id number]  . hydrOXYzine (ATARAX/VISTARIL) tablet 25 mg  25 mg Oral TID PRN Gonzella Lex, MD      . Derrill Memo ON 01/04/2016] Influenza vac split quadrivalent PF (FLUARIX) injection 0.5 mL  0.5 mL Intramuscular Tomorrow-1000 Jolanta B Pucilowska, MD      . losartan (COZAAR) tablet 100 mg  100 mg Oral Daily Gonzella Lex, MD   100 mg at 01/03/16 0837  . magnesium hydroxide (MILK OF MAGNESIA) suspension 30 mL  30 mL Oral Daily PRN Gonzella Lex, MD      . meloxicam (MOBIC) tablet 7.5 mg  7.5 mg Oral Daily Gonzella Lex, MD   7.5 mg at 01/03/16 0837  . metFORMIN (  GLUCOPHAGE) tablet 500 mg  500 mg Oral Q breakfast Gonzella Lex, MD   500 mg at 01/03/16 0836  . metoprolol succinate (TOPROL-XL) 24 hr tablet 50 mg  50 mg Oral Daily Gonzella Lex, MD   50 mg at 01/03/16 0837  . [START ON 01/04/2016] pneumococcal 23 valent vaccine (PNU-IMMUNE) injection 0.5 mL  0.5 mL Intramuscular Tomorrow-1000 Jolanta B Pucilowska, MD      . traZODone  (DESYREL) tablet 100 mg  100 mg Oral QHS PRN Gonzella Lex, MD       PTA Medications: Prescriptions Prior to Admission  Medication Sig Dispense Refill Last Dose  . Alcohol Swabs PADS 1 each by Does not apply route 2 (two) times daily. 100 each 12 unknown at Unknown time  . allopurinol (ZYLOPRIM) 100 MG tablet Take 100 mg by mouth daily.   unknown  . allopurinol (ZYLOPRIM) 300 MG tablet Take 300 mg by mouth daily.   unknown  . aspirin EC 81 MG tablet Take 81 mg by mouth daily.   unknown at Unknown time  . B Complex-C (B-COMPLEX WITH VITAMIN C) tablet Take 1 tablet by mouth daily.   unknown at Unknown time  . benzonatate (TESSALON PERLES) 100 MG capsule Take 2 capsules (200 mg total) by mouth 3 (three) times daily as needed for cough. 40 capsule 0   . chlordiazePOXIDE (LIBRIUM) 25 MG capsule 50 mg 3 times a day x 3 days 50 mg 2 times a day for 3 days 50 mg once a day for 3 days stop 35 capsule 0   . colchicine 0.6 MG tablet Take 0.6 mg by mouth daily as needed.    prn  . dicyclomine (BENTYL) 20 MG tablet Take 20 mg by mouth daily.   unknown  . famotidine (PEPCID) 40 MG tablet Take 40 mg by mouth at bedtime.   unknown at Unknown time  . glucose blood (ACCU-CHEK AVIVA PLUS) test strip 1 each by Other route 2 (two) times daily. Use as instructed 100 each 12 unknown at Unknown time  . Lancet Devices (ACCU-CHEK SOFTCLIX) lancets 1 each by Other route 2 (two) times daily. Use as instructed 1 each 12 unknown at Unknown time  . losartan (COZAAR) 100 MG tablet Take 100 mg by mouth daily.   unknown at Unknown time  . meloxicam (MOBIC) 7.5 MG tablet Take 7.5 mg by mouth daily.   unknown at Unknown time  . metFORMIN (GLUCOPHAGE) 500 MG tablet TAKE 1 TABLET BY MOUTH ONCE DAILY AFTER A MEAL. 90 tablet 3   . metoprolol succinate (TOPROL-XL) 50 MG 24 hr tablet Take 50 mg by mouth daily. Take with or immediately following a meal.   unknown  . Multiple Vitamins-Minerals (MULTIVITAMIN WITH MINERALS) tablet Take  1 tablet by mouth daily.   unknown at Unknown time  . omeprazole (PRILOSEC) 20 MG capsule Take 20 mg by mouth 2 (two) times daily.   unknown at Unknown time  . predniSONE (DELTASONE) 10 MG tablet Take 3 tablets once a day for 3 days. 9 tablet 0   . promethazine (PHENERGAN) 25 MG tablet TAKE 1 TABLET BY MOUTH TWICE DAILY 60 tablet 6 unknown  . VENTOLIN HFA 108 (90 Base) MCG/ACT inhaler INHALE 2 PUFFS BY MOUTH EVERY 4 TO 6 HOURS AS NEEDED. 18 g 12     Musculoskeletal: Strength & Muscle Tone: within normal limits Gait & Station: normal Patient leans: N/A  Psychiatric Specialty Exam: Physical Exam  ROS  Blood pressure Marland Kitchen)  161/95, pulse 95, temperature 98 F (36.7 C), temperature source Oral, resp. rate 20, height 6\' 2"  (1.88 m), weight (!) 326 lb (147.9 kg), SpO2 99 %.Body mass index is 41.86 kg/m.  General Appearance: Fairly Groomed  Eye Contact:  Fair  Speech:  Clear and Coherent  Volume:  Normal  Mood:  Anxious  Affect:  Constricted  Thought Process:  Coherent  Orientation:  Full (Time, Place, and Person)  Thought Content:  Logical  Suicidal Thoughts:  No  Homicidal Thoughts:  No  Memory:  Immediate;   Fair Recent;   Fair Remote;   Fair  Judgement:  Impaired  Insight:  Shallow  Psychomotor Activity:  Normal  Concentration:  Concentration: Fair and Attention Span: Fair  Recall:  [employer] of Knowledge:  Poor  Language:  Fair  Akathisia:  No  Handed:  Right  AIMS (if indicated):     Assets:  Armed forces logistics/support/administrative officer Social Support  ADL's:  Intact  Cognition:  WNL  Sleep:  Number of Hours: 4.15    Treatment Plan Summary: Daily contact with patient to assess and evaluate symptoms and progress in treatment and Medication management  Observation Level/Precautions:  Continuous Observation  Laboratory:  Chemistry Profile  Psychotherapy:    Medications:    Consultations:    Discharge Concerns:    Estimated LOS:  Other:     Physician Treatment Plan for Primary  Diagnosis: <principal problem not specified> Long Term Goal(s): Improvement in symptoms so as ready for discharge  Short Term Goals: Ability to verbalize feelings will improve  Physician Treatment Plan for Secondary Diagnosis: Active Problems:   Adjustment disorder with depressed mood  Long Term Goal(s): Improvement in symptoms so as ready for discharge  Short Term Goals: Ability to verbalize feelings will improve  I certify that inpatient services furnished can reasonably be expected to improve the patient's condition.    Rainey Pines, MD 10/14/20174:29 PM
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
## 2016-01-03 NOTE — BHH Group Notes (Signed)
LCSW Group Therapy  Saturday 10/14/20107 Participation: Active Therapeutic goals: Group discussed relationship between thoughts, feelings, and behavior.  Group members asked to identify how these were relevant to their admission into the hospital and look at opportunities to change patterns of thinking/behavior.  Reaction: Pt able to meet above therapeutic goals. Dossie Arbour, MSW, LCSW
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
## 2016-01-04 LAB — HEMOGLOBIN A1C
HEMOGLOBIN A1C: 6 % — AB (ref 4.8–5.6)
MEAN PLASMA GLUCOSE: 126 mg/dL

## 2016-01-04 NOTE — Progress Notes (Signed)
Patient with sad affect, cooperative behavior with meals, meds and plan of care. No SI/HI at this time. Verbalizes needs appropriately, pleasant with staff. Therapy groups encouraged. Patient states he is thinking about his discharge. States he attends [employer] services and is due to graduate soon. States he "would like to discharge soon so he can get back to his day program" Safety maintained.

## 2016-01-04 NOTE — B[facility] Group Notes (Signed)
[facility] Group Notes:  (Nursing/MHT/Case Management/Adjunct)  Date:  01/04/2016  Time:  9:50 PM  Type of Therapy:  Group Therapy  Participation Level:  Active  Participation Quality:  Appropriate  Affect:  Appropriate  Cognitive:  Appropriate  Insight:  Appropriate  Engagement in Group:  Engaged  Modes of Intervention:  Discussion  Summary of Progress/Problems:  Kandis Fantasia 01/04/2016, 9:50 PM

## 2016-01-04 NOTE — Progress Notes (Signed)
[facility] MD Progress Note  01/04/2016 10:32 PM Alan Eaton  MRN:  [id number] Subjective:    This is a 40 year old moderately  obese male with history of alcohol abuse and mood symptoms who presented to the hospital from [facility] and to get medical clearance. Patient reported that he has been attending group therapy for the past 2 months and has been doing well. He reported that he has been taking care of himself and then he was evaluated he expressed suicidal thoughts.  He was feeling depressed because his dog of 7 years was put down as he was sick.   During my interview patient may focus on being discharged. He reported that he is doing well and has been helping other patients on the unit. He appeared calm during the interview. He remains focused on his discharge planning. We discussed about his medications. He reported that he is not having any side effects or any withdrawal symptoms at this time. He slept well. He has been compliant with them.   He denied having any perceptual disturbances. He reported that he is sober now.  Principal Problem: Adjustment disorder with depressed mood Diagnosis:   Patient Active Problem List   Diagnosis Date Noted  . Adjustment disorder with depressed mood [F43.21] 01/02/2016  . Obesity [E66.9] 10/27/2015  . Alcohol withdrawal ([place]) [F10.239] 10/27/2015  . Orchitis of right testicle [N45.2] 08/10/2015  . Overdose of sedative or hypnotic [T42.71XA] 05/26/2015  . Alcohol abuse [F10.10] 05/26/2015  . Substance induced mood disorder ([place]) [F19.94] 05/26/2015  . Severe episode of recurrent major depressive disorder, without psychotic features ([place]) [F33.2]   . Alcohol dependence with intoxication with complication ([place]) 0000000   . Overdose [T50.901A] 05/23/2015  . Sepsis ([place]) [A41.9] 01/23/2015   Total Time spent with patient: 20 minutes  Past Psychiatric History:  Patient has had several presentations to the psychiatric service in the past usually in the  context of his ongoing alcohol abuse. He says he does have a history of 1 suicide attempt in the past. He apparently is not taking any antidepressant medicine according to his medicine list right now. He doesn't think that medicines have ever been clearly helpful. Denies any history of psychosis  Past Medical History:  Past Medical History:  Diagnosis Date  . Alcohol abuse   . Bronchitis   . Depression   . Diabetes mellitus without complication ([place])   . GERD (gastroesophageal reflux disease)   . Gout   . Hypertension   . Morbid obesity ([place])   . Suicide ideation     Past Surgical History:  Procedure Laterality Date  . none     Family History:  Family History  Problem Relation Age of Onset  . Hypertension    . Diabetes Mellitus II     Family Psychiatric  History:   Social History:  History  Alcohol Use  . 0.0 oz/week    Comment: occasional     History  Drug Use  . Frequency: 1.0 time per week  . Types: Marijuana    Social History   Social History  . Marital status: Divorced    Spouse name: N/A  . Number of children: N/A  . Years of education: N/A   Social History Main Topics  . Smoking status: Current Every Day Smoker    Packs/day: 0.50    Types: Cigarettes  . Smokeless tobacco: Never Used  . Alcohol use 0.0 oz/week     Comment: occasional  . Drug use:  Frequency: 1.0 time per week    Types: Marijuana  . Sexual activity: Not Asked   Other Topics Concern  . None   Social History Narrative  . None   Additional Social History:                         Sleep: Fair  Appetite:  Fair  Current Medications: Current Facility-Administered Medications  Medication Dose Route Frequency Provider Last Rate Last Dose  . acetaminophen (TYLENOL) tablet 650 mg  650 mg Oral Q6H PRN Gonzella Lex, MD      . albuterol (PROVENTIL HFA;VENTOLIN HFA) 108 (90 Base) MCG/ACT inhaler 2 puff  2 puff Inhalation Q4H PRN Gonzella Lex, MD   2 puff at 01/04/16  0950  . allopurinol (ZYLOPRIM) tablet 100 mg  100 mg Oral Daily Gonzella Lex, MD   100 mg at 01/04/16 0946  . alum & mag hydroxide-simeth (MAALOX/MYLANTA) 200-200-20 MG/5ML suspension 30 mL  30 mL Oral Q4H PRN Gonzella Lex, MD      . aspirin EC tablet 81 mg  81 mg Oral Daily Gonzella Lex, MD   81 mg at 01/04/16 0945  . famotidine (PEPCID) tablet 20 mg  20 mg Oral BID Gonzella Lex, MD   20 mg at 01/04/16 1715  . hydrOXYzine (ATARAX/VISTARIL) tablet 25 mg  25 mg Oral TID PRN Gonzella Lex, MD      . losartan (COZAAR) tablet 100 mg  100 mg Oral Daily Gonzella Lex, MD   100 mg at 01/04/16 0945  . magnesium hydroxide (MILK OF MAGNESIA) suspension 30 mL  30 mL Oral Daily PRN Gonzella Lex, MD      . meloxicam (MOBIC) tablet 7.5 mg  7.5 mg Oral Daily Gonzella Lex, MD   7.5 mg at 01/04/16 0946  . metFORMIN (GLUCOPHAGE) tablet 500 mg  500 mg Oral Q breakfast Gonzella Lex, MD   500 mg at 01/04/16 0946  . metoprolol succinate (TOPROL-XL) 24 hr tablet 50 mg  50 mg Oral Daily Gonzella Lex, MD   50 mg at 01/04/16 0945  . pneumococcal 23 valent vaccine (PNU-IMMUNE) injection 0.5 mL  0.5 mL Intramuscular Tomorrow-1000 Jolanta B Pucilowska, MD      . traZODone (DESYREL) tablet 100 mg  100 mg Oral QHS PRN Gonzella Lex, MD        Lab Results:  Results for orders placed or performed during the hospital encounter of 01/02/16 (from the past 48 hour(s))  Hemoglobin A1c     Status: Abnormal   Collection Time: 01/03/16  6:51 AM  Result Value Ref Range   Hgb A1c MFr Bld 6.0 (H) 4.8 - 5.6 %    Comment: (NOTE)         Pre-diabetes: 5.7 - 6.4         Diabetes: >6.4         Glycemic control for adults with diabetes: <7.0    Mean Plasma Glucose 126 mg/dL    Comment: (NOTE) Performed At: [facility] [place], [place] [id number] Lindon Romp MD [id number]   Lipid panel     Status: Abnormal   Collection Time: 01/03/16  6:51 AM  Result Value Ref Range   Cholesterol  140 0 - 200 mg/dL   Triglycerides 133 <150 mg/dL   HDL 36 (L) >40 mg/dL   Total CHOL/HDL Ratio 3.9 RATIO   VLDL 27 0 -  40 mg/dL   LDL Cholesterol 77 0 - 99 mg/dL    Comment:        Total Cholesterol/HDL:CHD Risk Coronary Heart Disease Risk Table                     Men   Women  1/2 Average Risk   3.4   3.3  Average Risk       5.0   4.4  2 X Average Risk   9.6   7.1  3 X Average Risk  23.4   11.0        Use the calculated Patient Ratio above and the CHD Risk Table to determine the patient's CHD Risk.        ATP III CLASSIFICATION (LDL):  <100     mg/dL   Optimal  100-129  mg/dL   Near or Above                    Optimal  130-159  mg/dL   Borderline  160-189  mg/dL   High  >190     mg/dL   Very High   TSH     Status: None   Collection Time: 01/03/16  6:51 AM  Result Value Ref Range   TSH 2.377 0.350 - 4.500 uIU/mL    Comment: Performed by a 3rd Generation assay with a functional sensitivity of <=0.01 uIU/mL.    Blood Alcohol level:  Lab Results  Component Value Date   ETH <5 01/02/2016   ETH 198 (H) [id number]    Metabolic Disorder Labs: Lab Results  Component Value Date   HGBA1C 6.0 (H) 01/03/2016   MPG 126 01/03/2016   No results found for: PROLACTIN Lab Results  Component Value Date   CHOL 140 01/03/2016   TRIG 133 01/03/2016   HDL 36 (L) 01/03/2016   CHOLHDL 3.9 01/03/2016   VLDL 27 01/03/2016   LDLCALC 77 01/03/2016    Physical Findings: AIMS:  , ,  ,  ,    CIWA:    COWS:     Musculoskeletal: Strength & Muscle Tone: within normal limits Gait & Station: normal Patient leans: N/A  Psychiatric Specialty Exam: Physical Exam  ROS  Blood pressure (!) 152/95, pulse 76, temperature 98.3 F (36.8 C), temperature source Oral, resp. rate 18, height 6\' 2"  (1.88 m), weight (!) 326 lb (147.9 kg), SpO2 98 %.Body mass index is 41.86 kg/m.  General Appearance: Casual  Eye Contact:  Fair  Speech:  Normal Rate  Volume:  Normal  Mood:  Anxious  Affect:   Congruent  Thought Process:  Goal Directed  Orientation:  Full (Time, Place, and Person)  Thought Content:  Logical  Suicidal Thoughts:  No  Homicidal Thoughts:  No  Memory:  Immediate;   Fair Recent;   Fair Remote;   Fair  Judgement:  Fair  Insight:  Fair  Psychomotor Activity:  Normal  Concentration:  Concentration: Fair and Attention Span: Fair  Recall:  [employer] of Knowledge:  Fair  Language:  Fair  Akathisia:  No  Handed:  Right  AIMS (if indicated):     Assets:  Communication Skills Desire for Improvement Social Support  ADL's:  Intact  Cognition:  WNL  Sleep:  Number of Hours: 5.15     Treatment Plan Summary: Medication management  Rainey Pines, MD 01/04/2016, 10:32 PM

## 2016-01-04 NOTE — Progress Notes (Signed)
Pt denies SI/HI/AVH. Affect sad at times but seen smiling and interacting with peers in dayroom. Did not attend evening group. Continues to endorse depression over loss of pet. No physical complaints. Voices no additional concerns at this time. Encouragement and support provided. Safety maintained. Will continue to monitor.

## 2016-01-04 NOTE — BHH Group Notes (Signed)
[place] LCSW Group Therapy  01/04/2016 2:45 PM  Type of Therapy:  Group Therapy  Participation Level:  Minimal  Participation Quality:  Attentive  Affect: Appropriate   Cognitive:  Alert   Insight:  Limited  Engagement in Therapy:  Limited  Modes of Intervention:  Discussion, Education, Socialization and Support  Summary of Progress/Problems: Balance in life: Patients will discuss the concept of balance and how it looks and feels to be unbalanced. Pt will identify areas in their life that is unbalanced and ways to become more balanced.  Pt attended group and stayed the entire time.  He sat quietly and listened to other group members share.   [place] MSW, [place]  01/04/2016, 2:45 PM

## 2016-01-05 ENCOUNTER — Encounter: Payer: Self-pay | Admitting: Psychiatry

## 2016-01-05 DIAGNOSIS — I10 Essential (primary) hypertension: Secondary | ICD-10-CM | POA: Diagnosis present

## 2016-01-05 DIAGNOSIS — K219 Gastro-esophageal reflux disease without esophagitis: Secondary | ICD-10-CM | POA: Diagnosis present

## 2016-01-05 DIAGNOSIS — M109 Gout, unspecified: Secondary | ICD-10-CM | POA: Diagnosis present

## 2016-01-05 DIAGNOSIS — E119 Type 2 diabetes mellitus without complications: Secondary | ICD-10-CM

## 2016-01-05 DIAGNOSIS — Z23 Encounter for immunization: Secondary | ICD-10-CM | POA: Diagnosis not present

## 2016-01-05 DIAGNOSIS — F17200 Nicotine dependence, unspecified, uncomplicated: Secondary | ICD-10-CM | POA: Diagnosis present

## 2016-01-05 MED ORDER — TRAZODONE HCL 100 MG PO TABS: 100.0000 mg | ORAL_TABLET | Freq: Every evening | ORAL | 1 refills | Status: DC | PRN

## 2016-01-05 MED ORDER — HYDROXYZINE HCL 25 MG PO TABS: 25.0000 mg | ORAL_TABLET | Freq: Three times a day (TID) | ORAL | 1 refills | Status: DC | PRN

## 2016-01-05 NOTE — BHH Suicide Risk Assessment (Signed)
[facility] Discharge Suicide Risk Assessment   Principal Problem: Adjustment disorder with depressed mood Discharge Diagnoses:  Patient Active Problem List   Diagnosis Date Noted  . HTN (hypertension) [I10] 01/05/2016  . Gout [M10.9] 01/05/2016  . GERD (gastroesophageal reflux disease) [K21.9] 01/05/2016  . Diabetes ([place]) [E11.9] 01/05/2016  . Tobacco use disorder [F17.200] 01/05/2016  . Adjustment disorder with depressed mood [F43.21] 01/02/2016  . Obesity [E66.9] 10/27/2015  . Alcohol withdrawal ([place]) [F10.239] 10/27/2015  . Orchitis of right testicle [N45.2] 08/10/2015  . Overdose of sedative or hypnotic [T42.71XA] 05/26/2015  . Alcohol use disorder, moderate, in early remission ([place]) [F10.21] 05/26/2015  . Substance induced mood disorder ([place]) [F19.94] 05/26/2015  . Severe episode of recurrent major depressive disorder, without psychotic features ([place]) [F33.2]   . Alcohol dependence with intoxication with complication ([place]) 0000000   . Overdose [T50.901A] 05/23/2015  . Sepsis ([place]) [A41.9] 01/23/2015    Total Time spent with patient: 30 minutes  Musculoskeletal: Strength & Muscle Tone: within normal limits Gait & Station: normal Patient leans: N/A  Psychiatric Specialty Exam: Review of Systems  Psychiatric/Behavioral: The patient is nervous/anxious.   All other systems reviewed and are negative.   Blood pressure (!) 153/100, pulse 94, temperature 97.6 F (36.4 C), temperature source Oral, resp. rate 18, height 6\' 2"  (1.88 m), weight (!) 147.9 kg (326 lb), SpO2 98 %.Body mass index is 41.86 kg/m.  General Appearance: Casual  Eye Contact::  Good  Speech:  Clear and Coherent409  Volume:  Normal  Mood:  Anxious  Affect:  Appropriate  Thought Process:  Goal Directed and Descriptions of Associations: Intact  Orientation:  Full (Time, Place, and Person)  Thought Content:  WDL  Suicidal Thoughts:  No  Homicidal Thoughts:  No  Memory:  Immediate;   Fair Recent;    Fair Remote;   Fair  Judgement:  Fair  Insight:  Present  Psychomotor Activity:  Normal  Concentration:  Fair  Recall:  [employer] of [place]  Language: Fair  Akathisia:  No  Handed:  Right  AIMS (if indicated):     Assets:  Communication Skills Desire for Improvement Financial Resources/Insurance Housing Physical Health Resilience Social Support  Sleep:  Number of Hours: 5.15  Cognition: WNL  ADL's:  Intact   Mental Status Per Nursing Assessment::   On Admission:     Demographic Factors:  Male and Caucasian  Loss Factors: Loss of significant relationship  Historical Factors: Prior suicide attempts and Impulsivity  Risk Reduction Factors:   Sense of responsibility to family, Living with another person, especially a relative, Positive social support and Positive therapeutic relationship  Continued Clinical Symptoms:  Depression:   Comorbid alcohol abuse/dependence Impulsivity Alcohol/Substance Abuse/Dependencies  Cognitive Features That Contribute To Risk:  None    Suicide Risk:  Minimal: No identifiable suicidal ideation.  Patients presenting with no risk factors but with morbid ruminations; may be classified as minimal risk based on the severity of the depressive symptoms  Follow-up Information    [facility]. Go on 01/05/2016.   Why:  9:00am, hospital follow up with [facility]. Contact information: [place] [id number] [phone] [place] Of Care/Follow-up recommendations:  Activity:  as tolerated. Diet:  low sodium heart healthy ADA diet. Other:  keep follow up appointments.  Orson Slick, MD 01/05/2016, 7:28 AM

## 2016-01-05 NOTE — Progress Notes (Signed)
Patient denies SI/HI, denies A/V hallucinations. Patient verbalizes understanding of discharge instructions, follow up care and prescriptions. Patient given all belongings from  locker. Patient escorted out by staff, transported by family. 

## 2016-01-05 NOTE — Progress Notes (Signed)
  [facility] Adult Case Management Discharge Plan :  Will you be returning to the same living situation after discharge:  Yes,  home with family. At discharge, do you have transportation home?: Yes,  mother will pick pt up. Do you have the ability to pay for your medications: Yes,  Medicaid/Medicare.  Release of information consent forms completed and in the chart;  [name] needed at discharge.  Patient to Follow up at: Follow-up Information    [facility]. Go on 01/06/2016.   Why:  9:00am, Please arrive to your hospital follow up appointment with [facility]. If you have any questions or concerns, contact [facility]. Contact information: [place] [id number] [phone] FAX           Next level of care provider has access to [place] and Suicide Prevention discussed: Yes,  SPE completed with pt and family.  Have you used any form of tobacco in the last 30 days? (Cigarettes, Smokeless Tobacco, Cigars, and/or Pipes): Yes  Has patient been referred to the Quitline?: Patient refused referral  Patient has been referred for addiction treatment: Yes - [facility].  Emilie Rutter, MSW, LCSW-A 01/05/2016, 11:10 AM

## 2016-01-05 NOTE — Discharge Summary (Signed)
Physician Discharge Summary Note  Patient:  Alan Eaton is an 40 y.o., male MRN:  [id number] DOB:  [birth year] Patient phone:  [phone] (home)  Patient address:   [address] [place] [address],  Total Time spent with patient: 30 minutes  Date of Admission:  01/02/2016 Date of Discharge: 19/16/2017  Reason for Admission:  Suicidal ideation.  This is a 40 year old Are greatly obese male with history of alcohol abuse and mood symptoms who presented to the hospital from [facility] and to get medical clearance. Patient reported that he has been attending group therapy for the past 2 months and has been doing well. He reported that he has been taking care of himself and then he was evaluated he expressed suicidal thoughts.  He was feeling depressed because his dog of 7 years was put down as he was sick.   During my interview patient reported that he was feeling sad and overwhelmed and thought that he cannot go through it anymore. He reported in the ER multiple times that if he would be sent back home he will not be able to handle this crisis. . Denies homicidal ideation. Denies any psychosis or hallucinations. He claims that he has been going regularly to [facility] for outpatient substance abuse treatment and that he has not had any alcohol or drugs in 60 days. He does not report any other particular stressor. During my interview patient reported that he wants to be transferred to the medical floor as he thinks that he is doing better and he came here only for his blood pressure check.  He denied having any perceptual disturbances. He reported that he is sober now.  Associated Signs/Symptoms: Depression Symptoms:  depressed mood, psychomotor retardation, fatigue, feelings of worthlessness/guilt, anxiety, panic attacks, disturbed sleep, weight gain, (Hypo) Manic Symptoms:  Impulsivity, Labiality of Mood, Anxiety Symptoms:  Excessive Worry, Psychotic Symptoms:  none PTSD Symptoms:  Negative  Past Psychiatric History:  Patient has had several presentations to the psychiatric service in the past usually in the context of his ongoing alcohol abuse. He says he does have a history of 1 suicide attempt in the past. He apparently is not taking any antidepressant medicine according to his medicine list right now. He doesn't think that medicines have ever been clearly helpful. Denies any history of psychosis  Family Psychiatric  History: alcohol.  Principal Problem: Adjustment disorder with depressed mood Discharge Diagnoses: Patient Active Problem List   Diagnosis Date Noted  . HTN (hypertension) [I10] 01/05/2016  . Gout [M10.9] 01/05/2016  . GERD (gastroesophageal reflux disease) [K21.9] 01/05/2016  . Diabetes ([place]) [E11.9] 01/05/2016  . Tobacco use disorder [F17.200] 01/05/2016  . Adjustment disorder with depressed mood [F43.21] 01/02/2016  . Obesity [E66.9] 10/27/2015  . Alcohol withdrawal ([place]) [F10.239] 10/27/2015  . Orchitis of right testicle [N45.2] 08/10/2015  . Overdose of sedative or hypnotic [T42.71XA] 05/26/2015  . Alcohol use disorder, moderate, in early remission ([place]) [F10.21] 05/26/2015  . Substance induced mood disorder ([place]) [F19.94] 05/26/2015  . Severe episode of recurrent major depressive disorder, without psychotic features ([place]) [F33.2]   . Alcohol dependence with intoxication with complication ([place]) 0000000   . Overdose [T50.901A] 05/23/2015  . Sepsis ([place]) [A41.9] 01/23/2015    Past Medical History:  Past Medical History:  Diagnosis Date  . Alcohol abuse   . Bronchitis   . Depression   . Diabetes mellitus without complication ([place])   . GERD (gastroesophageal reflux disease)   . Gout   . Hypertension   .  Morbid obesity ([place])   . Suicide ideation     Past Surgical History:  Procedure Laterality Date  . none     Family History:  Family History  Problem Relation Age of Onset  . Hypertension    . Diabetes Mellitus II      Social History:  History  Alcohol Use  . 0.0 oz/week    Comment: occasional     History  Drug Use  . Frequency: 1.0 time per week  . Types: Marijuana    Social History   Social History  . Marital status: Divorced    Spouse name: N/A  . Number of children: N/A  . Years of education: N/A   Social History Main Topics  . Smoking status: Current Every Day Smoker    Packs/day: 0.50    Types: Cigarettes  . Smokeless tobacco: Never Used  . Alcohol use 0.0 oz/week     Comment: occasional  . Drug use:     Frequency: 1.0 time per week    Types: Marijuana  . Sexual activity: Not Asked   Other Topics Concern  . None   Social History Narrative  . None    Hospital Course:   [name] is a 40 year old male with a history od depression, anxiety, mood instability and alcoholism admidde for suicidal ideation after his dog passed away.  1. Suicidal ideation. This has resolved. The patient is abe to contract for safety. He is forward thinking and optimistic about the future.  2. Mood. The patient declined antidepressants. He was prescribed Hydroxyzine for anxiety and Trazodone for sleep.  3. HTN. He is on Metoprolol, Losartan and ASA.  4. Gout. He is on Allopurinol.  5. Diabetes. He is on Metformin.  6. GERD. He is on Pepcid.  7. Alcohol abuse. He has been sober lately. He will continue with [facility] IOP program.  8. Smoking. Nicotine patch is available.  9. Disposition. He will be discharged to home. He will follow up with [facility].  Physical Findings: AIMS:  , ,  ,  ,    CIWA:    COWS:     Musculoskeletal: Strength & Muscle Tone: within normal limits Gait & Station: normal Patient leans: N/A  Psychiatric Specialty Exam: Physical Exam  Nursing note and vitals reviewed.   Review of Systems  Psychiatric/Behavioral: Positive for substance abuse. The patient is nervous/anxious.   All other systems reviewed and are negative.   Blood pressure (!) 153/100, pulse 94,  temperature 97.6 F (36.4 C), temperature source Oral, resp. rate 18, height 6\' 2"  (1.88 m), weight (!) 147.9 kg (326 lb), SpO2 98 %.Body mass index is 41.86 kg/m.  See SRA.                                                  Sleep:  Number of Hours: 5.15     Have you used any form of tobacco in the last 30 days? (Cigarettes, Smokeless Tobacco, Cigars, and/or Pipes): Yes  Has this patient used any form of tobacco in the last 30 days? (Cigarettes, Smokeless Tobacco, Cigars, and/or Pipes) Yes, Yes, A prescription for an FDA-approved tobacco cessation medication was offered at discharge and the patient refused  Blood Alcohol level:  Lab Results  Component Value Date   [facility] <5 01/02/2016   ETH 198 (H) 10/27/2015  Metabolic Disorder Labs:  Lab Results  Component Value Date   HGBA1C 6.0 (H) 01/03/2016   MPG 126 01/03/2016   No results found for: PROLACTIN Lab Results  Component Value Date   CHOL 140 01/03/2016   TRIG 133 01/03/2016   HDL 36 (L) 01/03/2016   CHOLHDL 3.9 01/03/2016   VLDL 27 01/03/2016   LDLCALC 77 01/03/2016    See Psychiatric Specialty Exam and Suicide Risk Assessment completed by Attending Physician prior to discharge.  Discharge destination:  Home  Is patient on multiple antipsychotic therapies at discharge:  No   Has Patient had three or more failed trials of antipsychotic monotherapy by history:  No  Recommended Plan for Multiple Antipsychotic Therapies: NA  Discharge Instructions    Diet - low sodium heart healthy    Complete by:  As directed    Increase activity slowly    Complete by:  As directed        Medication List    STOP taking these medications   Alcohol Swabs Pads   benzonatate 100 MG capsule Commonly known as:  TESSALON PERLES   chlordiazePOXIDE 25 MG capsule Commonly known as:  LIBRIUM   colchicine 0.6 MG tablet   dicyclomine 20 MG tablet Commonly known as:  BENTYL   omeprazole 20 MG  capsule Commonly known as:  PRILOSEC   predniSONE 10 MG tablet Commonly known as:  DELTASONE   promethazine 25 MG tablet Commonly known as:  PHENERGAN     TAKE these medications     Indication  accu-chek softclix lancets 1 each by Other route 2 (two) times daily. Use as instructed  Indication:  diabetes   allopurinol 300 MG tablet Commonly known as:  ZYLOPRIM Take 300 mg by mouth daily.  Indication:  Primary Gout   allopurinol 100 MG tablet Commonly known as:  ZYLOPRIM Take 100 mg by mouth daily.  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  Indication:  Inflammation   B-complex with vitamin C tablet Take 1 tablet by mouth daily.  Indication:  general health   famotidine 40 MG tablet Commonly known as:  PEPCID Take 40 mg by mouth at bedtime.  Indication:  Heartburn   glucose blood test strip Commonly known as:  ACCU-CHEK AVIVA PLUS 1 each by Other route 2 (two) times daily. Use as instructed  Indication:  diabetes   hydrOXYzine 25 MG tablet Commonly known as:  ATARAX/VISTARIL Take 1 tablet (25 mg total) by mouth 3 (three) times daily as needed for anxiety.  Indication:  Anxiety Neurosis   losartan 100 MG tablet Commonly known as:  COZAAR Take 100 mg by mouth daily.  Indication:  High Blood Pressure Disorder   meloxicam 7.5 MG tablet Commonly known as:  MOBIC Take 7.5 mg by mouth daily.  Indication:  Joint Damage causing Pain and Loss of Function   metFORMIN 500 MG tablet Commonly known as:  GLUCOPHAGE TAKE 1 TABLET BY MOUTH ONCE DAILY AFTER A MEAL.  Indication:  Type 2 Diabetes   metoprolol succinate 50 MG 24 hr tablet Commonly known as:  TOPROL-XL Take 50 mg by mouth daily. Take with or immediately following a meal.  Indication:  High Blood Pressure Disorder   multivitamin with minerals tablet Take 1 tablet by mouth daily.  Indication:  general health   traZODone 100 MG tablet Commonly  known as:  DESYREL Take 1 tablet (100 mg total) by mouth  at bedtime as needed for sleep.  Indication:  Trouble Sleeping   VENTOLIN HFA 108 (90 Base) MCG/ACT inhaler Generic drug:  albuterol INHALE 2 PUFFS BY MOUTH EVERY 4 TO 6 HOURS AS NEEDED.  Indication:  Chronic Obstructive Lung Disease      Follow-up Information    [facility]. Go on 01/05/2016.   Why:  9:00am, hospital follow up with [facility]. Contact information: [place] [id number] [phone] FAX           Follow-up recommendations:  Activity:  as tolerated. Diet:  low sodium heart healthy ADA diet. Other:  keep follow up appointments.  Comments:    Signed: Orson Slick, MD 01/05/2016, 7:33 AM

## 2016-01-05 NOTE — Progress Notes (Signed)
Observed pt in the dayroom watching television with staff and peers. Alert and oriented x 4, respirations even and unlabored, gait steady and unassisted, no acute distress noted. Denies SI/HI/AVH at this time. No complaints voiced. Pt stated "I had a good day today. I feel a lot better." Denies feeling anxious or depressed. No complaints voiced. Asked pt if he wanted the Flu and Pneumococcal vaccine in the morning and he stated "i'd better take it now. I don't like needles and I want to get it over with." Flu vaccine placed in pts left deltoid, Pneumococcal vaccine placed in right deltoid. Pt tolerated it well. Remains on q15 min observation checks for safety. Will continue to monitor.

## 2016-01-10 ENCOUNTER — Emergency Department
Admission: EM | Admit: 2016-01-10 | Discharge: 2016-01-11 | Disposition: A | Discharge status: Other Acute Inpatient Hospital | Payer: Medicare Other | Attending: Emergency Medicine | Admitting: Emergency Medicine

## 2016-01-10 ENCOUNTER — Encounter: Payer: Self-pay | Admitting: Emergency Medicine

## 2016-01-10 DIAGNOSIS — F17200 Nicotine dependence, unspecified, uncomplicated: Secondary | ICD-10-CM | POA: Diagnosis present

## 2016-01-10 DIAGNOSIS — Z79899 Other long term (current) drug therapy: Secondary | ICD-10-CM | POA: Insufficient documentation

## 2016-01-10 DIAGNOSIS — I10 Essential (primary) hypertension: Secondary | ICD-10-CM | POA: Insufficient documentation

## 2016-01-10 DIAGNOSIS — F17210 Nicotine dependence, cigarettes, uncomplicated: Secondary | ICD-10-CM | POA: Insufficient documentation

## 2016-01-10 DIAGNOSIS — Z7984 Long term (current) use of oral hypoglycemic drugs: Secondary | ICD-10-CM | POA: Insufficient documentation

## 2016-01-10 DIAGNOSIS — Z046 Encounter for general psychiatric examination, requested by authority: Secondary | ICD-10-CM | POA: Diagnosis present

## 2016-01-10 DIAGNOSIS — F10920 Alcohol use, unspecified with intoxication, uncomplicated: Secondary | ICD-10-CM

## 2016-01-10 DIAGNOSIS — R45851 Suicidal ideations: Secondary | ICD-10-CM

## 2016-01-10 DIAGNOSIS — Z7982 Long term (current) use of aspirin: Secondary | ICD-10-CM | POA: Insufficient documentation

## 2016-01-10 DIAGNOSIS — F329 Major depressive disorder, single episode, unspecified: Secondary | ICD-10-CM | POA: Diagnosis not present

## 2016-01-10 DIAGNOSIS — F10120 Alcohol abuse with intoxication, uncomplicated: Secondary | ICD-10-CM | POA: Insufficient documentation

## 2016-01-10 DIAGNOSIS — M109 Gout, unspecified: Secondary | ICD-10-CM | POA: Diagnosis present

## 2016-01-10 DIAGNOSIS — E119 Type 2 diabetes mellitus without complications: Secondary | ICD-10-CM | POA: Diagnosis not present

## 2016-01-10 DIAGNOSIS — K219 Gastro-esophageal reflux disease without esophagitis: Secondary | ICD-10-CM | POA: Diagnosis present

## 2016-01-10 DIAGNOSIS — F32A Depression, unspecified: Secondary | ICD-10-CM
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
## 2016-01-10 NOTE — ED Notes (Signed)
TTS at bedside for consult

## 2016-01-10 NOTE — ED Triage Notes (Signed)
Pt denies SI/HI 

## 2016-01-10 NOTE — ED Triage Notes (Signed)
Pt presents to ED c/o "wanting to talk to somebody about family problems." Requesting to speak to therapist. Pt is obviously intoxicated, slurring speech, endorses drinking "a couple of 82s," unable to obtain a clear story secondary to intoxication. Recently seen at [facility], states he had been sober for 70 days.

## 2016-01-10 NOTE — ED Notes (Signed)
MD at bedside. 

## 2016-01-11 ENCOUNTER — Inpatient Hospital Stay
Admit: 2016-01-11 | Discharge: 2016-01-13 | DRG: 885 | Disposition: A | Discharge status: Home / Self Care | Payer: Medicare Other | Source: Intra-hospital | Attending: Psychiatry | Admitting: Psychiatry

## 2016-01-11 ENCOUNTER — Encounter: Payer: Self-pay | Admitting: Psychiatry

## 2016-01-11 DIAGNOSIS — F17210 Nicotine dependence, cigarettes, uncomplicated: Secondary | ICD-10-CM | POA: Diagnosis present

## 2016-01-11 DIAGNOSIS — M109 Gout, unspecified: Secondary | ICD-10-CM | POA: Diagnosis present

## 2016-01-11 DIAGNOSIS — F6810 Factitious disorder, unspecified: Secondary | ICD-10-CM | POA: Diagnosis present

## 2016-01-11 DIAGNOSIS — E669 Obesity, unspecified: Secondary | ICD-10-CM | POA: Diagnosis present

## 2016-01-11 DIAGNOSIS — K219 Gastro-esophageal reflux disease without esophagitis: Secondary | ICD-10-CM | POA: Diagnosis present

## 2016-01-11 DIAGNOSIS — F32A Depression, unspecified: Secondary | ICD-10-CM

## 2016-01-11 DIAGNOSIS — Z888 Allergy status to other drugs, medicaments and biological substances status: Secondary | ICD-10-CM

## 2016-01-11 DIAGNOSIS — I10 Essential (primary) hypertension: Secondary | ICD-10-CM | POA: Diagnosis present

## 2016-01-11 DIAGNOSIS — E119 Type 2 diabetes mellitus without complications: Secondary | ICD-10-CM | POA: Diagnosis present

## 2016-01-11 DIAGNOSIS — Z833 Family history of diabetes mellitus: Secondary | ICD-10-CM | POA: Diagnosis not present

## 2016-01-11 DIAGNOSIS — Z7984 Long term (current) use of oral hypoglycemic drugs: Secondary | ICD-10-CM

## 2016-01-11 DIAGNOSIS — Z91030 Bee allergy status: Secondary | ICD-10-CM

## 2016-01-11 DIAGNOSIS — F332 Major depressive disorder, recurrent severe without psychotic features: Secondary | ICD-10-CM | POA: Diagnosis present

## 2016-01-11 DIAGNOSIS — F329 Major depressive disorder, single episode, unspecified: Secondary | ICD-10-CM

## 2016-01-11 DIAGNOSIS — Z8249 Family history of ischemic heart disease and other diseases of the circulatory system: Secondary | ICD-10-CM | POA: Diagnosis not present

## 2016-01-11 DIAGNOSIS — F1020 Alcohol dependence, uncomplicated: Secondary | ICD-10-CM

## 2016-01-11 DIAGNOSIS — Z79899 Other long term (current) drug therapy: Secondary | ICD-10-CM

## 2016-01-11 DIAGNOSIS — F4489 Other dissociative and conversion disorders: Secondary | ICD-10-CM

## 2016-01-11 DIAGNOSIS — R45851 Suicidal ideations: Secondary | ICD-10-CM | POA: Diagnosis present

## 2016-01-11 DIAGNOSIS — F17200 Nicotine dependence, unspecified, uncomplicated: Secondary | ICD-10-CM | POA: Diagnosis present

## 2016-01-11 LAB — CBC WITH DIFFERENTIAL/PLATELET
Basophils Absolute: 0.1 10*3/uL (ref 0–0.1)
Basophils Relative: 1 %
EOS PCT: 5 %
Eosinophils Absolute: 0.6 10*3/uL (ref 0–0.7)
HEMATOCRIT: 42.9 % (ref 40.0–52.0)
HEMOGLOBIN: 14.7 g/dL (ref 13.0–18.0)
LYMPHS ABS: 4.6 10*3/uL — AB (ref 1.0–3.6)
LYMPHS PCT: 40 %
MCH: 30.6 pg (ref 26.0–34.0)
MCHC: 34.1 g/dL (ref 32.0–36.0)
MCV: 89.6 fL (ref 80.0–100.0)
Monocytes Absolute: 0.8 10*3/uL (ref 0.2–1.0)
Monocytes Relative: 7 %
NEUTROS PCT: 47 %
Neutro Abs: 5.3 10*3/uL (ref 1.4–6.5)
Platelets: 230 10*3/uL (ref 150–440)
RBC: 4.79 MIL/uL (ref 4.40–5.90)
RDW: 15 % — ABNORMAL HIGH (ref 11.5–14.5)
WBC: 11.4 10*3/uL — AB (ref 3.8–10.6)

## 2016-01-11 LAB — GLUCOSE, CAPILLARY
GLUCOSE-CAPILLARY: 84 mg/dL (ref 65–99)
Glucose-Capillary: 99 mg/dL (ref 65–99)

## 2016-01-11 LAB — URINE DRUG SCREEN, QUALITATIVE (ARMC ONLY)
Amphetamines, Ur Screen: NOT DETECTED
BARBITURATES, UR SCREEN: NOT DETECTED
Benzodiazepine, Ur Scrn: NOT DETECTED
CANNABINOID 50 NG, UR ~~LOC~~: NOT DETECTED
COCAINE METABOLITE, UR ~~LOC~~: NOT DETECTED
MDMA (ECSTASY) UR SCREEN: NOT DETECTED
Methadone Scn, Ur: NOT DETECTED
OPIATE, UR SCREEN: NOT DETECTED
PHENCYCLIDINE (PCP) UR S: NOT DETECTED
Tricyclic, Ur Screen: NOT DETECTED

## 2016-01-11 LAB — COMPREHENSIVE METABOLIC PANEL
ALK PHOS: 51 U/L (ref 38–126)
ALT: 46 U/L (ref 17–63)
AST: 29 U/L (ref 15–41)
Albumin: 4.1 g/dL (ref 3.5–5.0)
Anion gap: 10 (ref 5–15)
BUN: 9 mg/dL (ref 6–20)
CALCIUM: 8.8 mg/dL — AB (ref 8.9–10.3)
CO2: 23 mmol/L (ref 22–32)
CREATININE: 1.39 mg/dL — AB (ref 0.61–1.24)
Chloride: 102 mmol/L (ref 101–111)
Glucose, Bld: 98 mg/dL (ref 65–99)
Potassium: 3.6 mmol/L (ref 3.5–5.1)
Sodium: 135 mmol/L (ref 135–145)
Total Bilirubin: 0.6 mg/dL (ref 0.3–1.2)
Total Protein: 7.7 g/dL (ref 6.5–8.1)

## 2016-01-11 LAB — ETHANOL
Alcohol, Ethyl (B): 174 mg/dL — ABNORMAL HIGH (ref <5)
Alcohol, Ethyl (B): 228 mg/dL — ABNORMAL HIGH (ref <5)

## 2016-01-11 LAB — ACETAMINOPHEN LEVEL

## 2016-01-11 LAB — SALICYLATE LEVEL

## 2016-01-11 MED ORDER — TRAZODONE HCL 100 MG PO TABS
100.0000 mg | ORAL_TABLET | Freq: Every evening | ORAL | Status: DC | PRN
  Administered 2016-01-12: 100 mg via ORAL
  Filled 2016-01-11: qty 1

## 2016-01-11 MED ORDER — ALUM & MAG HYDROXIDE-SIMETH 200-200-20 MG/5ML PO SUSP: 30.0000 mL | ORAL | Status: DC | PRN

## 2016-01-11 MED ORDER — HYDROXYZINE HCL 25 MG PO TABS: 25.0000 mg | ORAL_TABLET | Freq: Three times a day (TID) | ORAL | Status: DC | PRN

## 2016-01-11 MED ORDER — TRAZODONE HCL 100 MG PO TABS: 100.0000 mg | ORAL_TABLET | Freq: Every evening | ORAL | Status: DC | PRN

## 2016-01-11 MED ORDER — LOSARTAN POTASSIUM 25 MG PO TABS
100.0000 mg | ORAL_TABLET | Freq: Every day | ORAL | Status: DC
  Administered 2016-01-11 – 2016-01-13 (×3): 100 mg via ORAL
  Filled 2016-01-11 (×3): qty 4

## 2016-01-11 MED ORDER — MELOXICAM 7.5 MG PO TABS
7.5000 mg | ORAL_TABLET | Freq: Every day | ORAL | Status: DC
  Administered 2016-01-11 – 2016-01-13 (×3): 7.5 mg via ORAL
  Filled 2016-01-11 (×3): qty 1

## 2016-01-11 MED ORDER — B COMPLEX-C PO TABS
1.0000 | ORAL_TABLET | Freq: Every day | ORAL | Status: DC
  Administered 2016-01-11 – 2016-01-13 (×3): 1 via ORAL
  Filled 2016-01-11 (×3): qty 1

## 2016-01-11 MED ORDER — ADULT MULTIVITAMIN W/MINERALS CH
1.0000 | ORAL_TABLET | Freq: Every day | ORAL | Status: DC
  Administered 2016-01-11 – 2016-01-13 (×3): 1 via ORAL
  Filled 2016-01-11 (×3): qty 1

## 2016-01-11 MED ORDER — MAGNESIUM HYDROXIDE 400 MG/5ML PO SUSP: 30.0000 mL | Freq: Every day | ORAL | Status: DC | PRN

## 2016-01-11 MED ORDER — METFORMIN HCL 500 MG PO TABS
1000.0000 mg | ORAL_TABLET | Freq: Two times a day (BID) | ORAL | Status: DC
  Administered 2016-01-11 – 2016-01-13 (×5): 1000 mg via ORAL
  Filled 2016-01-11 (×5): qty 2

## 2016-01-11 MED ORDER — ACETAMINOPHEN 325 MG PO TABS: 650.0000 mg | ORAL_TABLET | Freq: Four times a day (QID) | ORAL | Status: DC | PRN

## 2016-01-11 MED ORDER — FAMOTIDINE 20 MG PO TABS
40.0000 mg | ORAL_TABLET | Freq: Every day | ORAL | Status: DC
  Administered 2016-01-11 – 2016-01-12 (×2): 40 mg via ORAL
  Filled 2016-01-11 (×2): qty 2

## 2016-01-11 MED ORDER — ASPIRIN EC 81 MG PO TBEC
81.0000 mg | DELAYED_RELEASE_TABLET | Freq: Every day | ORAL | Status: DC
  Administered 2016-01-11 – 2016-01-13 (×3): 81 mg via ORAL
  Filled 2016-01-11 (×3): qty 1

## 2016-01-11 MED ORDER — MULTI-VITAMIN/MINERALS PO TABS
1.0000 | ORAL_TABLET | Freq: Every day | ORAL | Status: DC
  Filled 2016-01-11: qty 1

## 2016-01-11 MED ORDER — CITALOPRAM HYDROBROMIDE 20 MG PO TABS
20.0000 mg | ORAL_TABLET | Freq: Every day | ORAL | Status: DC
  Administered 2016-01-11 – 2016-01-12 (×2): 20 mg via ORAL
  Filled 2016-01-11 (×2): qty 1

## 2016-01-11 MED ORDER — ALLOPURINOL 100 MG PO TABS
100.0000 mg | ORAL_TABLET | Freq: Every day | ORAL | Status: DC
  Administered 2016-01-11 – 2016-01-13 (×3): 100 mg via ORAL
  Filled 2016-01-11 (×3): qty 1

## 2016-01-11 MED ORDER — ALBUTEROL SULFATE HFA 108 (90 BASE) MCG/ACT IN AERS
2.0000 | INHALATION_SPRAY | Freq: Four times a day (QID) | RESPIRATORY_TRACT | Status: DC | PRN
  Filled 2016-01-11: qty 6.7

## 2016-01-11 MED ORDER — METOPROLOL SUCCINATE ER 50 MG PO TB24
50.0000 mg | ORAL_TABLET | Freq: Every day | ORAL | Status: DC
  Administered 2016-01-11 – 2016-01-13 (×3): 50 mg via ORAL
  Filled 2016-01-11 (×3): qty 1

## 2016-01-11 NOTE — [facility] Group Notes (Signed)
[facility] LCSW Group Therapy  01/11/2016 3:47 PM  Type of Therapy:  Group Therapy  Participation Level:  Minimal  Participation Quality:  Attentive  Affect:  Approriate  Cognitive:  Lacking  Insight:  Lacking  Engagement in Therapy:  Limited  Modes of Intervention:  Discussion, Education and Support  Summary of Progress/Problems:Communications: Patients identify how individuals communicate with one another appropriately and inappropriately. Patients will be guided to discuss their thoughts, feelings, and behaviors related to barriers when communicating. The group will process together ways to execute positive and appropriate communications.   Takyah Ciaramitaro G. Carroll, [place] 01/11/2016, 3:52 PM
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
## 2016-01-11 NOTE — BH Assessment (Signed)
Patient is to be admitted to [facility] by Dr. Bary Leriche.  Attending Physician will be Dr. Jerilee Hoh.   Patient has been assigned to room 323, by [place].   Intake Paper Work has been signed and placed on patient chart.  ER staff is aware of the admission Estill Bamberg, ER Sect.; Dr. Karma Greaser, ER MD; Claiborne Billings, Patient's Nurse & Claiborne Billings, Patient Access).

## 2016-01-11 NOTE — [facility] Suicide Risk Assessment (Signed)
[facility] Admission Suicide Risk Assessment   Nursing information obtained from:  Patient Demographic factors:  Male Current Mental Status:  NA Loss Factors:  Loss of significant relationship Historical Factors:  Prior suicide attempts Risk Reduction Factors:  Living with another person, especially a relative  Total Time spent with patient: 1 hour Principal Problem: <principal problem not specified> Diagnosis:   Patient Active Problem List   Diagnosis Date Noted  . HTN (hypertension) [I10] 01/05/2016  . Gout [M10.9] 01/05/2016  . GERD (gastroesophageal reflux disease) [K21.9] 01/05/2016  . Diabetes ([place]) [E11.9] 01/05/2016  . Tobacco use disorder [F17.200] 01/05/2016  . Adjustment disorder with depressed mood [F43.21] 01/02/2016  . Obesity [E66.9] 10/27/2015  . Alcohol withdrawal ([place]) [F10.239] 10/27/2015  . Orchitis of right testicle [N45.2] 08/10/2015  . Overdose of sedative or hypnotic [T42.71XA] 05/26/2015  . Alcohol use disorder, moderate, in early remission ([place]) [F10.21] 05/26/2015  . Substance induced mood disorder ([place]) [F19.94] 05/26/2015  . Severe episode of recurrent major depressive disorder, without psychotic features ([place]) [F33.2]   . Alcohol dependence with intoxication with complication ([place]) 0000000   . Overdose [T50.901A] 05/23/2015  . Sepsis ([place]) [A41.9] 01/23/2015   Subjective Data: Suicidal ideation, alcohol use.  Continued Clinical Symptoms:  Alcohol Use Disorder Identification Test Final Score (AUDIT): 5 The "Alcohol Use Disorders Identification Test", Guidelines for Use in Primary Care, Second Edition.  World Pharmacologist [facility]). Score between 0-7:  no or low risk or alcohol related problems. Score between 8-15:  moderate risk of alcohol related problems. Score between 16-19:  high risk of alcohol related problems. Score 20 or above:  warrants further diagnostic evaluation for alcohol dependence and treatment.   CLINICAL FACTORS:   Depression:    Comorbid alcohol abuse/dependence Impulsivity Alcohol/Substance Abuse/Dependencies   Musculoskeletal: Strength & Muscle Tone: within normal limits Gait & Station: normal Patient leans: N/A  Psychiatric Specialty Exam: Physical Exam  Nursing note and vitals reviewed.   Review of Systems  Psychiatric/Behavioral: Positive for depression and substance abuse.  All other systems reviewed and are negative.   Blood pressure (!) 145/90, pulse (!) 112, temperature 98.2 F (36.8 C), temperature source Oral, resp. rate 18, height 6\' 1"  (1.854 m), weight 104.3 kg (230 lb), SpO2 100 %.Body mass index is 30.34 kg/m.  General Appearance: Disheveled  Eye Contact:  Fair  Speech:  Clear and Coherent  Volume:  Normal  Mood:  Anxious, Depressed and Worthless  Affect:  Blunt  Thought Process:  Goal Directed  Orientation:  Full (Time, Place, and Person)  Thought Content:  WDL  Suicidal Thoughts:  No  Homicidal Thoughts:  No  Memory:  Immediate;   Fair Recent;   Fair Remote;   Fair  Judgement:  Poor  Insight:  Lacking  Psychomotor Activity:  Decreased  Concentration:  Concentration: Fair and Attention Span: Fair  Recall:  [employer] of Knowledge:  Fair  Language:  Fair  Akathisia:  No  Handed:  Right  AIMS (if indicated):     Assets:  Communication Skills Desire for Improvement Housing Resilience Social Support  ADL's:  Intact  Cognition:  WNL  Sleep:  Number of Hours: 0      COGNITIVE FEATURES THAT CONTRIBUTE TO RISK:  None    SUICIDE RISK:   Moderate:  Frequent suicidal ideation with limited intensity, and duration, some specificity in terms of plans, no associated intent, good self-control, limited dysphoria/symptomatology, some risk factors present, and identifiable protective factors, including available and accessible social support.  PLAN OF CARE: Hospital admission, medication management, substance abuse counseling, discharge planning.  Mr. Rosten is a 40 year old  male with a history od depression, anxiety, mood instability and alcoholism admidde for suicidal ideation after his dog passed away.  1. Suicidal ideation. The patient is abe to contract for safety in the hospital.   2. Mood. The patient is ready to start antidepressants. We will prescribe Celexa for depression and Trazodone for sleep.  3. HTN. He is on Metoprolol, Losartan and ASA.  4. Gout. He is on Allopurinol.  5. Diabetes. He is on Metformin, ADA diet and blood glucose monitoring..  6. GERD. He is on Pepcid.  7. Alcohol abuse. The patient relapsed onalcohiol on the night of admission. No detox necessary. He has been sober lately. He will continue with [facility] IOP program.  8. Smoking. Nicotine patch is available.  9. Disposition. He will be discharged to home. He will follow up with [facility].  I certify that inpatient services furnished can reasonably be expected to improve the patient's condition.  Orson Slick, MD 01/11/2016, 12:54 PM
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
## 2016-01-11 NOTE — ED Notes (Signed)
Receiving rn aware of need for urine sample.

## 2016-01-11 NOTE — Progress Notes (Signed)
Affect remains sad but smiles upon approach. He denies SI/HI/AVH.  Minimal interaction with peers and staff.  Tends to isolate at most times.  Support and encouragement offered.  Safety maintained. He appears to be resting in the bed quietly.

## 2016-01-11 NOTE — H&P (Signed)
Psychiatric Admission Assessment Adult  Patient Identification: Alan Eaton MRN:  [id number] Date of Evaluation:  01/11/2016 Chief Complaint:  DEPRESSION Principal Diagnosis: <principal problem not specified> Diagnosis:   Patient Active Problem List   Diagnosis Date Noted  . HTN (hypertension) [I10] 01/05/2016  . Gout [M10.9] 01/05/2016  . GERD (gastroesophageal reflux disease) [K21.9] 01/05/2016  . Diabetes ([place]) [E11.9] 01/05/2016  . Tobacco use disorder [F17.200] 01/05/2016  . Adjustment disorder with depressed mood [F43.21] 01/02/2016  . Obesity [E66.9] 10/27/2015  . Alcohol withdrawal ([place]) [F10.239] 10/27/2015  . Orchitis of right testicle [N45.2] 08/10/2015  . Overdose of sedative or hypnotic [T42.71XA] 05/26/2015  . Alcohol use disorder, moderate, in early remission ([place]) [F10.21] 05/26/2015  . Substance induced mood disorder ([place]) [F19.94] 05/26/2015  . Severe episode of recurrent major depressive disorder, without psychotic features ([place]) [F33.2]   . Alcohol dependence with intoxication with complication ([place]) 0000000   . Overdose [T50.901A] 05/23/2015  . Sepsis ([place]) [A41.9] 01/23/2015   History of Present Illness:   Identifying data.  [name] is a 40 y.o. male  with a history of depression and alcoholism.   Chief complaint. "It was a drunk talk."   History of present illness. Information was obtained from the patient and the chart. The patient has a long history of depression, mood instability, suicide attempts and alcoholism. He has been sober for over 70 days participating in outpatient substance abuse treatment program at [facility]. On the day of admission he relapsed on alcohol and came to the emergency room asking for help reporting suicidal ideation. He has just learn, and this was confirmed by our nursing staff while the patient was in the emergency room, that his ex-wife committed extended suicide. She killed herself and one of the kids. The other child is  hospitalized. The patient feels sad and none he has never been interested in taking antidepressants but now is ready to start something. He reports poor sleep, anhedonia, feeling of guilt hopelessness worthlessness, poor energy and concentration, and heightened anxiety and crying spells. He denies psychotic symptoms or symptoms suggestive of bipolar mania. He just relapsed on alcohol. He does not use other drugs.   Past psychiatric history. Long history of alcoholism, depression, and mood instability. There was at least one suicide attempt by overdose. He was tried on Prozac with no improvement. Up until recently he declined treatment for depression.  Family psychiatric history. Depression.  Social history. He lives with his mother.  Total Time spent with patient: 1 hour  Is the patient at risk to self? Yes.    Has the patient been a risk to self in the past 6 months? No.  Has the patient been a risk to self within the distant past? Yes.    Is the patient a risk to others? No.  Has the patient been a risk to others in the past 6 months? No.  Has the patient been a risk to others within the distant past? No.   Prior Inpatient Therapy:   Prior Outpatient Therapy:    Alcohol Screening: 1. How often do you have a drink containing alcohol?: Monthly or less 2. How many drinks containing alcohol do you have on a typical day when you are drinking?: 1 or 2 3. How often do you have six or more drinks on one occasion?: Less than monthly Preliminary Score: 1 4. How often during the last year have you found that you were not able to stop drinking once you had started?: Less  than monthly 5. How often during the last year have you failed to do what was normally expected from you becasue of drinking?: Never 6. How often during the last year have you needed a first drink in the morning to get yourself going after a heavy drinking session?: Never 7. How often during the last year have you had a feeling of  guilt of remorse after drinking?: Never 8. How often during the last year have you been unable to remember what happened the night before because you had been drinking?: Never 9. Have you or someone else been injured as a result of your drinking?: No 10. Has a relative or friend or a doctor or another health worker been concerned about your drinking or suggested you cut down?: Yes, but not in the last year Alcohol Use Disorder Identification Test Final Score (AUDIT): 5 Brief Intervention: AUDIT score less than 7 or less-screening does not suggest unhealthy drinking-brief intervention not indicated Substance Abuse History in the last 12 months:  Yes.   Consequences of Substance Abuse: Negative Previous Psychotropic Medications: Yes  Psychological Evaluations: No  Past Medical History:  Past Medical History:  Diagnosis Date  . Alcohol abuse   . Bronchitis   . Depression   . Diabetes mellitus without complication ([place])   . GERD (gastroesophageal reflux disease)   . Gout   . Hypertension   . Morbid obesity ([place])   . Suicide ideation     Past Surgical History:  Procedure Laterality Date  . none     Family History:  Family History  Problem Relation Age of Onset  . Hypertension    . Diabetes Mellitus II     Tobacco Screening: Have you used any form of tobacco in the last 30 days? (Cigarettes, Smokeless Tobacco, Cigars, and/or Pipes): Yes Tobacco use, Select all that apply: 5 or more cigarettes per day Are you interested in Tobacco Cessation Medications?: No, patient refused Counseled patient on smoking cessation including recognizing danger situations, developing coping skills and basic information about quitting provided: Yes Social History:  History  Alcohol Use  . 0.0 oz/week    Comment: occasional     History  Drug Use  . Frequency: 1.0 time per week  . Types: Marijuana    Additional Social History: Marital status: Single Are you sexually active?: No What is your  sexual orientation?: Heterosexual Has your sexual activity been affected by drugs, alcohol, medication, or emotional stress?: No Does patient have children?: Yes How many children?: 2 How is patient's relationship with their children?: Patient recently learned that his ex-wife killed one of his daughters and the other is in critical care. The wife then committed suicide.                          Allergies:   Allergies  Allergen Reactions  . Bee Venom Swelling  . Bactrim [Sulfamethoxazole-Trimethoprim] Swelling and Rash   Lab Results:  Results for orders placed or performed during the hospital encounter of 01/11/16 (from the past 48 hour(s))  Urine Drug Screen, Qualitative ([facility] only)     Status: None   Collection Time: 01/11/16 10:56 AM  Result Value Ref Range   Tricyclic, Ur Screen NONE DETECTED NONE DETECTED   Amphetamines, Ur Screen NONE DETECTED NONE DETECTED   MDMA (Ecstasy)Ur Screen NONE DETECTED NONE DETECTED   Cocaine Metabolite,Ur [place] NONE DETECTED NONE DETECTED   Opiate, Ur Screen NONE DETECTED NONE DETECTED   Phencyclidine (  PCP) Ur S NONE DETECTED NONE DETECTED   Cannabinoid 50 Ng, Ur [place] NONE DETECTED NONE DETECTED   Barbiturates, Ur Screen NONE DETECTED NONE DETECTED   Benzodiazepine, Ur Scrn NONE DETECTED NONE DETECTED   Methadone Scn, Ur NONE DETECTED NONE DETECTED    Comment: (NOTE) [id number]  Tricyclics, urine               Cutoff 1000 ng/mL 200  Amphetamines, urine             Cutoff 1000 ng/mL 300  MDMA (Ecstasy), urine           Cutoff 500 ng/mL 400  Cocaine Metabolite, urine       Cutoff 300 ng/mL 500  Opiate, urine                   Cutoff 300 ng/mL 600  Phencyclidine (PCP), urine      Cutoff 25 ng/mL 700  Cannabinoid, urine              Cutoff 50 ng/mL 800  Barbiturates, urine             Cutoff 200 ng/mL 900  Benzodiazepine, urine           Cutoff 200 ng/mL 1000 Methadone, urine                Cutoff 300 ng/mL 1100 1200 The urine drug screen provides  only a preliminary, unconfirmed 1300 analytical test result and should not be used for non-medical 1400 purposes. Clinical consideration and professional judgment should 1500 be applied to any positive drug screen result due to possible 1600 interfering substances. A more specific alternate chemical method 1700 must be used in order to obtain a confirmed analytical result.  1800 Gas chromato graphy / mass spectrometry (GC/MS) is the preferred 1900 confirmatory method.   Glucose, capillary     Status: None   Collection Time: 01/11/16 12:00 PM  Result Value Ref Range   Glucose-Capillary 99 65 - 99 mg/dL    Blood Alcohol level:  Lab Results  Component Value Date   ETH 174 (H) 01/11/2016   ETH 228 (H) [id number]    Metabolic Disorder Labs:  Lab Results  Component Value Date   HGBA1C 6.0 (H) 01/03/2016   MPG 126 01/03/2016   No results found for: PROLACTIN Lab Results  Component Value Date   CHOL 140 01/03/2016   TRIG 133 01/03/2016   HDL 36 (L) 01/03/2016   CHOLHDL 3.9 01/03/2016   VLDL 27 01/03/2016   LDLCALC 77 01/03/2016    Current Medications: Current Facility-Administered Medications  Medication Dose Route Frequency Provider Last Rate Last Dose  . acetaminophen (TYLENOL) tablet 650 mg  650 mg Oral Q6H PRN Zakyla Tonche B Alys Dulak, MD      . albuterol (PROVENTIL HFA;VENTOLIN HFA) 108 (90 Base) MCG/ACT inhaler 2 puff  2 puff Inhalation Q6H PRN Nupur Hohman B Takako Minckler, MD      . allopurinol (ZYLOPRIM) tablet 100 mg  100 mg Oral Daily Bethany Hirt B Weldon Nouri, MD   100 mg at 01/11/16 1021  . alum & mag hydroxide-simeth (MAALOX/MYLANTA) 200-200-20 MG/5ML suspension 30 mL  30 mL Oral Q4H PRN Alliya Marcon B Alvena Kiernan, MD      . aspirin EC tablet 81 mg  81 mg Oral Daily Ocia Simek B Momo Braun, MD   81 mg at 01/11/16 1021  . B-complex with vitamin C tablet 1 tablet  1 tablet Oral Daily Luann Aspinwall B Keiko Myricks, MD   1 tablet at  01/11/16 1021  . famotidine (PEPCID) tablet 40 mg  40 mg Oral QHS  Lular Letson B Kerrilynn Derenzo, MD      . hydrOXYzine (ATARAX/VISTARIL) tablet 25 mg  25 mg Oral TID PRN Kincade Granberg B Carolann Brazell, MD      . losartan (COZAAR) tablet 100 mg  100 mg Oral Daily Allysson Rinehimer B Jabarie Pop, MD   100 mg at 01/11/16 1022  . magnesium hydroxide (MILK OF MAGNESIA) suspension 30 mL  30 mL Oral Daily PRN Romanda Turrubiates B Mildreth Reek, MD      . meloxicam (MOBIC) tablet 7.5 mg  7.5 mg Oral Daily Malissie Musgrave B Creighton Longley, MD   7.5 mg at 01/11/16 1021  . metFORMIN (GLUCOPHAGE) tablet 1,000 mg  1,000 mg Oral BID WC Ebubechukwu Jedlicka B Ladine Kiper, MD   1,000 mg at 01/11/16 1021  . metoprolol succinate (TOPROL-XL) 24 hr tablet 50 mg  50 mg Oral Daily Emersynn Deatley B Mystery Schrupp, MD   50 mg at 01/11/16 1021  . multivitamin with minerals tablet 1 tablet  1 tablet Oral Daily Kanton Kamel B Sheri Prows, MD      . traZODone (DESYREL) tablet 100 mg  100 mg Oral QHS PRN Clovis Fredrickson, MD       PTA Medications: Prescriptions Prior to Admission  Medication Sig Dispense Refill Last Dose  . allopurinol (ZYLOPRIM) 100 MG tablet Take 100 mg by mouth daily.   unknown  . allopurinol (ZYLOPRIM) 300 MG tablet Take 300 mg by mouth daily.   unknown  . aspirin EC 81 MG tablet Take 81 mg by mouth daily.   unknown at Unknown time  . B Complex-C (B-COMPLEX WITH VITAMIN C) tablet Take 1 tablet by mouth daily.   unknown at Unknown time  . famotidine (PEPCID) 40 MG tablet Take 40 mg by mouth at bedtime.   unknown at Unknown time  . glucose blood (ACCU-CHEK AVIVA PLUS) test strip 1 each by Other route 2 (two) times daily. Use as instructed 100 each 12 unknown at Unknown time  . hydrOXYzine (ATARAX/VISTARIL) 25 MG tablet Take 1 tablet (25 mg total) by mouth 3 (three) times daily as needed for anxiety. 90 tablet 1   . Lancet Devices (ACCU-CHEK SOFTCLIX) lancets 1 each by Other route 2 (two) times daily. Use as instructed 1 each 12 unknown at Unknown time  . losartan (COZAAR) 100 MG tablet Take 100 mg by mouth daily.   unknown at Unknown time  .  meloxicam (MOBIC) 7.5 MG tablet Take 7.5 mg by mouth daily.   unknown at Unknown time  . metFORMIN (GLUCOPHAGE) 500 MG tablet TAKE 1 TABLET BY MOUTH ONCE DAILY AFTER A MEAL. 90 tablet 3   . metoprolol succinate (TOPROL-XL) 50 MG 24 hr tablet Take 50 mg by mouth daily. Take with or immediately following a meal.   unknown  . Multiple Vitamins-Minerals (MULTIVITAMIN WITH MINERALS) tablet Take 1 tablet by mouth daily.   unknown at Unknown time  . traZODone (DESYREL) 100 MG tablet Take 1 tablet (100 mg total) by mouth at bedtime as needed for sleep. 30 tablet 1   . VENTOLIN HFA 108 (90 Base) MCG/ACT inhaler INHALE 2 PUFFS BY MOUTH EVERY 4 TO 6 HOURS AS NEEDED. 18 g 12     Musculoskeletal: Strength & Muscle Tone: within normal limits Gait & Station: normal Patient leans: N/A  Psychiatric Specialty Exam: I reviewed physical exam performed in the Emergency Room and agree with the findings. Physical Exam  Nursing note and vitals reviewed.   Review of Systems  Psychiatric/Behavioral: Positive for depression, substance abuse and suicidal ideas.  All other systems reviewed and are negative.   Blood pressure (!) 145/90, pulse (!) 112, temperature 98.2 F (36.8 C), temperature source Oral, resp. rate 18, height 6\' 1"  (1.854 m), weight 104.3 kg (230 lb), SpO2 100 %.Body mass index is 30.34 kg/m.  See SRA.                                                  Sleep:  Number of Hours: 0    Treatment Plan Summary: Daily contact with patient to assess and evaluate symptoms and progress in treatment and Medication management   Mr. Ghafoor is a 40 year old male with a history od depression, anxiety, mood instability and alcoholism admidde for suicidal ideation after his dog passed away.  1. Suicidal ideation. The patient is abe to contract for safety in the hospital.   2. Mood. The patient is ready to start antidepressants. We will prescribe Celexa for depression and Trazodone for  sleep.  3. HTN. He is on Metoprolol, Losartan and ASA.  4. Gout. He is on Allopurinol.  5. Diabetes. He is on Metformin, ADA diet and blood glucose monitoring..  6. GERD. He is on Pepcid.  7. Alcohol abuse. The patient relapsed onalcohiol on the night of admission. No detox necessary. He has been sober lately. He will continue with [facility] IOP program.  8. Smoking. Nicotine patch is available.  9. Disposition. He will be discharged to home. He will follow up with [facility].  Observation Level/Precautions:  15 minute checks  Laboratory:  CBC Chemistry Profile UDS UA  Psychotherapy:    Medications:    Consultations:    Discharge Concerns:    Estimated LOS:  Other:     Physician Treatment Plan for Primary Diagnosis: <principal problem not specified> Long Term Goal(s): Improvement in symptoms so as ready for discharge  Short Term Goals: Ability to identify changes in lifestyle to reduce recurrence of condition will improve, Ability to verbalize feelings will improve, Ability to disclose and discuss suicidal ideas, Ability to demonstrate self-control will improve, Ability to identify and develop effective coping behaviors will improve, Ability to maintain clinical measurements within normal limits will improve and Compliance with prescribed medications will improve  Physician Treatment Plan for Secondary Diagnosis: Active Problems:   Severe episode of recurrent major depressive disorder, without psychotic features ([facility])   Alcohol dependence with intoxication with complication ([place])   Substance induced mood disorder ([place])   Obesity   HTN (hypertension)   Gout   GERD (gastroesophageal reflux disease)   Diabetes ([place])   Tobacco use disorder  Long Term Goal(s): Improvement in symptoms so as ready for discharge  Short Term Goals: Ability to identify changes in lifestyle to reduce recurrence of condition will improve, Ability to demonstrate self-control will improve and Ability to  identify triggers associated with substance abuse/mental health issues will improve  I certify that inpatient services furnished can reasonably be expected to improve the patient's condition.    Orson Slick, MD 10/22/20171:03 PM

## 2016-01-11 NOTE — Progress Notes (Signed)
Affect sad but smiles upon approach.  Denies SI/HI/AVH.  Minimal interaction with peers and staff.  Tends to isolate.  Support and encouragement offered.  Safety maintained.

## 2016-01-11 NOTE — ED Notes (Signed)
Pt was given urinal and demonstrated steady gait to toliet. Pt then spilled entire urinal on floor. Will attempt to get urine sample the next time pt urinates.
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
## 2016-01-11 NOTE — BH Assessment (Signed)
Writer discuss patient with the On Call Attending Physician for [place] (Dr. Bary Leriche), he meets inpatient criteria. She will put admission orders in for the patient to be transferred to the Unit.  Discussed patient with [place] Nurse [facility]), when he is medically cleared (BAC must be under 200), continue to be non-aggressive, he will be able to transfer to the Unit.  Writer informed ER MD (Dr. Karma Greaser) what was discussed about the patient. He will have a repeat blood draw for the patient in "couple of hours." Writer also informed the patient's nurse Claiborne Billings) of the plan as well.

## 2016-01-11 NOTE — Tx Team (Signed)
Initial Treatment Plan 01/11/2016 6:27 AM [name] [id number]    PATIENT STRESSORS: Loss of child   PATIENT STRENGTHS: Average or above average intelligence  Ability for insight   PATIENT IDENTIFIED PROBLEMS: Depression  Grief                   DISCHARGE CRITERIA:  Improved stabilization in mood, thinking, and/or behavior  PRELIMINARY DISCHARGE PLAN: Outpatient therapy  PATIENT/FAMILY INVOLVEMENT: This treatment plan has been presented to and reviewed with the patient, Alan Eaton, and/or family member.  The patient and family have been given the opportunity to ask questions and make suggestions.  Isabella Bowens, RN 01/11/2016, 6:27 AM

## 2016-01-12 DIAGNOSIS — F4489 Other dissociative and conversion disorders: Secondary | ICD-10-CM

## 2016-01-12 LAB — GLUCOSE, CAPILLARY
GLUCOSE-CAPILLARY: 106 mg/dL — AB (ref 65–99)
GLUCOSE-CAPILLARY: 100 mg/dL — AB (ref 65–99)
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
## 2016-01-12 NOTE — [facility] Group Notes (Signed)
[place] LCSW Group Therapy   01/12/2016 1pm Type of Therapy: Group Therapy   Participation Level: Active   Participation Quality: Attentive, Sharing and Supportive   Affect: Depressed and Flat   Cognitive: Alert and Oriented   Insight: Developing/Improving and Engaged   Engagement in Therapy: Developing/Improving and Engaged   Modes of Intervention: Clarification, Confrontation, Discussion, Education, Exploration,  Limit-setting, Orientation, Problem-solving, Rapport Building, Art therapist, Socialization and Support   Summary of Progress/Problems: Pt identified obstacles faced currently and processed barriers involved in overcoming these obstacles. Pt identified steps necessary for overcoming these obstacles and explored motivation (internal and external) for facing these difficulties head on. Pt further identified one area of concern in their lives and chose a goal to focus on for today.  Pt shared the pt has "a problem' with alcohol and other substances and recently relapsed after "problems".  Pt was a vague historian.  Pt presented as amiable, pleasant and calm, but would not speak directly to the CSW, only talking to the CSW when the pt's eyes were averted.  Pt shared his goal is to remain sober and that a barrier to that goal is himself.  Pt demonstrated insight into his alcoholism and addiction and did not speak of the tragedy involving his family, per the pt' notes, instead, blaming himself for making poor choices.  Pt was polite and cooperative with the CSW and other group members and focused and attentive to the topics discussed and the sharing of others.   Alphonse Guild. Anne-Marie Genson, LCSWA, LCAS

## 2016-01-12 NOTE — Progress Notes (Signed)
Recreation Therapy Notes  INPATIENT RECREATION THERAPY ASSESSMENT  Patient Details Name: Alan Eaton MRN: [id number] DOB: June 29, 1975 Today's Date: Jan 18, 2016  Patient Stressors: Death (Ex-girlfriend killed one of his children and injured the other before she killed herself)  Coping Skills:   Isolate, Exercise, Art/Dance, Talking, Music, Sports  Personal Challenges: Anger, Self-Esteem/Confidence, Stress Management, Substance Abuse, Trusting Others  Leisure Interests (2+):  Individual - Other (Comment) (Karaoke, Warehouse manager)  Awareness of Community Resources:  Yes  Community Resources:  [employer], Other (Comment) Optician, dispensing)  Current Use: No  If no, Barriers?: Other (Comment) (Lack of motivation to go)  Patient Strengths:  Helpful, clean  Patient Identified Areas of Improvement:  Anger, stress  Current Recreation Participation:  Karaoke with mom  Patient Goal for Hospitalization:  To get better and get over the stress  [place] of Residence:  [place] of Residence:  [place]   Current SI (including self-harm):  No  Current HI:  No  Consent to Intern Participation: N/A   Leonette Monarch, LRT/CTRS 2016/01/18, 2:21 PM

## 2016-01-12 NOTE — Care Management (Signed)
I joined I[place] in his group this morning. He shared some but still seemed reserved. I think he is still trying to figure out his next move. We talked some afterwards, but he said nothing in regards to group or how he was currently feeling. I again reminded him of all the services we offer as a Publishing copy ( Advance Directive, Counseling, Literature, Prayer and et Ronney Asters).

## 2016-01-12 NOTE — Progress Notes (Signed)
Patient is pleasant & cooperative.Denies suicidal or homicidal ideations & any signs of withdrawals.Appropriate with staff & peers.Compliant with medications.Attended groups.Appetite & energy level good.Support & encouragement given.Safety maintained.

## 2016-01-12 NOTE — Progress Notes (Signed)
Recreation Therapy Notes  Date: 10.23.17 Time: 1:00 pm Location: Craft Room  Group Topic: Wellness  Goal Area(s) Addresses:  Patient will identify at least one item per dimension of health. Patient will examine areas they are deficient in.  Behavioral Response: Attentive, Interactive  Intervention: 6 Dimensions of Health  Activity: Patients were given a definition worksheet with the 6 Dimensions of Health. Patients were given a worksheet with each dimension listed and were instructed to write at least one item that they were currently doing under each dimension. LRT encouraged patients to think of 2-3 items.  Education: LRT educated patients on ways to improve each dimension.  Education Outcome: Acknowledges education/In group clarification offered  Clinical Observations/Feedback: Patient completed activity by writing at least one item in each dimension. Patient contributed to group discussion by stating what area he was giving enough attention to, what area he was not giving enough attention to, what he could do to improve certain dimensions, how this activity relates to his admission, and how this activity relates to his d/c.  Leonette Monarch, LRT/CTRS 01/12/2016 1:57 PM

## 2016-01-13 DIAGNOSIS — F32A Depression, unspecified: Secondary | ICD-10-CM

## 2016-01-13 DIAGNOSIS — F1020 Alcohol dependence, uncomplicated: Secondary | ICD-10-CM

## 2016-01-13 DIAGNOSIS — F6810 Factitious disorder, unspecified: Secondary | ICD-10-CM

## 2016-01-13 DIAGNOSIS — F329 Major depressive disorder, single episode, unspecified: Secondary | ICD-10-CM

## 2016-01-13 LAB — GLUCOSE, CAPILLARY
GLUCOSE-CAPILLARY: 92 mg/dL (ref 65–99)
GLUCOSE-CAPILLARY: 128 mg/dL — AB (ref 65–99)

## 2016-01-13 MED ORDER — CITALOPRAM HYDROBROMIDE 20 MG PO TABS: 20.0000 mg | ORAL_TABLET | Freq: Every day | ORAL | 0 refills | Status: DC

## 2016-01-13 NOTE — [facility] Group Notes (Signed)
Goals Group Date/Time: 01/13/2016 9:00 AM Type of Therapy and Topic: Group Therapy: Goals Group: SMART Goals   Participation Level: Moderate  Description of Group:    The purpose of a daily goals group is to assist and guide patients in setting recovery/wellness-related goals. The objective is to set goals as they relate to the crisis in which they were admitted. Patients will be using SMART goal modalities to set measurable goals. Characteristics of realistic goals will be discussed and patients will be assisted in setting and processing how one will reach their goal. Facilitator will also assist patients in applying interventions and coping skills learned in psycho-education groups to the SMART goal and process how one will achieve defined goal.   Therapeutic Goals:   -Patients will develop and document one goal related to or their crisis in which brought them into treatment.  -Patients will be guided by LCSW using SMART goal setting modality in how to set a measurable, attainable, realistic and time sensitive goal.  -Patients will process barriers in reaching goal.  -Patients will process interventions in how to overcome and successful in reaching goal.   Patient's Goal:  Pt shared the pt's goal is to "return to work" at discharge and reports the pt's goal will be assisted by the pt's job interview that is scheduled immediately after discharge.  Pt share a barrier to the pt's goal is fear.  Pt seems to have made great improvement during group, as evidenced by increased sharing, sharing at length when prompted and pt presents as happy and increasingly energetic, as compared to previous sessions.      Therapeutic Modalities:  Motivational Interviewing  Art gallery manager  SMART goals setting   Alphonse Guild. Dionna Wiedemann, LCSWA, LCAS

## 2016-01-13 NOTE — Progress Notes (Signed)
Recreation Therapy Notes  INPATIENT RECREATION TR PLAN  Patient Details Name: Alan Eaton MRN: [id number] DOB: 07/23/1975 Today's Date: 01/13/2016  Rec Therapy Plan Is patient appropriate for Therapeutic Recreation?: Yes Treatment times per week: At least once a week TR Treatment/Interventions: 1:1 session, Group participation (Comment) (Appropriate participation in daily recreational therapy tx)  Discharge Criteria Pt will be discharged from therapy if:: Treatment goals are met, Discharged Treatment plan/goals/alternatives discussed and agreed upon by:: Patient/family  Discharge Summary Short term goals set: See Care Plan Short term goals met: Complete Progress toward goals comments: One-to-one attended Which groups?: Wellness, Other (Comment) (Self-expression) One-to-one attended: Anger management, stress management Reason goals not met: N/A Therapeutic equipment acquired: None Reason patient discharged from therapy: Discharge from hospital Pt/family agrees with progress & goals achieved: Yes Date patient discharged from therapy: 01/13/16   Leonette Monarch, LRT/CTRS 01/13/2016, 4:40 PM

## 2016-01-13 NOTE — Progress Notes (Signed)
  [facility] Adult Case Management Discharge Plan :  Will you be returning to the same living situation after discharge:  Yes,    At discharge, do you have transportation home?: Yes,    Do you have the ability to pay for your medications: Yes,     Release of information consent forms completed and in the chart;  [name] needed at discharge.  Patient to Follow up at: Follow-up Information    [facility]. Go on 01/14/2016.   Why:  Walk in for Hosptial Follow up appointment between 8am-3pm.  At this appointment they will schedule you with Medication Management appointment with psychiatrist. Contact information: [place] [id number] [phone] [place]          Next level of care provider has access to [place] and Suicide Prevention discussed: Yes,     Have you used any form of tobacco in the last 30 days? (Cigarettes, Smokeless Tobacco, Cigars, and/or Pipes): Yes  Has patient been referred to the Quitline?: Patient refused referral  Patient has been referred for addiction treatment: Yes  August Saucer, MSW, LCSW 01/13/2016, 10:59 AM

## 2016-01-13 NOTE — Progress Notes (Signed)
Recreation Therapy Notes  Date: 10.24.17 Time: 2:00 pm Location: Craft Room  Group Topic: Self-expression  Goal Area(s) Addresses:  Patient will be able to identify a color that represents each emotion. Patient will verbalize benefit of using art as a means of self-expression. Patient will verbalize one emotion experienced while participating in activity.  Behavioral Response: Attentive, Interactive  Intervention: The Colors Within Me  Activity: Patients were give blank face worksheet and were instructed to pick a color for each emotion they were feeling and to show on the worksheet how much of that emotion they were feeling.  Education: LRT educated patients on other forms of self-expression.  Education Outcome: Acknowledges education/In group clarification offered   Clinical Observations/Feedback: Patient completed activity by picking a color for each emotion and showing on the worksheet how much of that emotion he was feeling. Patient contributed to group discussion by stating how his emotions affect his treatment in the hospital, and that it was helpful to see his emotions on paper.  Leonette Monarch, LRT/CTRS 01/13/2016 4:07 PM

## 2016-01-13 NOTE — Progress Notes (Signed)
D: Minimal interaction with staff. Flat affect. Denies SI/HI/AVH. States he needs to D/C so he can get to his meeting at [facility]. Visible in the milieu. Interacts with peers.  A: Medication given with education. Encouragement provided.  R: Patient was compliant with medication. He has remained calm and cooperative. Safety maintained with 15 min checks.

## 2016-01-13 NOTE — Discharge Summary (Signed)
Physician Discharge Summary Note  Patient:  Alan Eaton is an 40 y.o., male MRN:  [id number] DOB:  1976-01-18 Patient phone:  [phone] (home)  Patient address:   [address] [place] [address],  Total Time spent with patient: 30 minutes  Date of Admission:  01/11/2016 Date of Discharge: 01/13/16  Reason for Admission:  SI  Principal Problem: Depression Discharge Diagnoses: Patient Active Problem List   Diagnosis Date Noted  . 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 Unspecified depressive disorder [F32.9] 01/13/2016  . Alcohol use disorder, severe, dependence ([place]) [F10.20] 01/13/2016  . 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 HTN (hypertension) [I10] 01/05/2016  . Gout [M10.9] 01/05/2016  . GERD (gastroesophageal reflux disease) [K21.9] 01/05/2016  . Diabetes ([place]) [E11.9] 01/05/2016  . Tobacco use disorder [F17.200] 01/05/2016  . Obesity [E66.9] 10/27/2015    History of Present Illness:   Identifying data.  Alan Eaton a 40 y.o.male with a history of depression and alcoholism.   Chief complaint. "It was a drunk talk."   History of present illness. Information was obtained from the patient and the chart. The patient has a long history of depression, mood instability, suicide attempts and alcoholism. He has been sober for over 70 days participating in outpatient substance abuse treatment program at [facility]. On the day of admission he relapsed on alcohol and came to the emergency room asking for help reporting suicidal ideation. He has just learn, and this was confirmed by our nursing staff while the patient was in the emergency room, that his ex-wife committed extended suicide. She killed herself and one of the kids. The other child is hospitalized. The patient feels sad and none he has never been interested in taking antidepressants but now is ready to start something. He reports poor sleep, anhedonia, feeling of guilt hopelessness worthlessness, poor  energy and concentration, and heightened anxiety and crying spells. He denies psychotic symptoms or symptoms suggestive of bipolar mania. He just relapsed on alcohol. He does not use other drugs.   Past psychiatric history. Long history of alcoholism, depression, and mood instability. There was at least one suicide attempt by overdose. He was tried on Prozac with no improvement. Up until recently he declined treatment for depression.  Family psychiatric history. Depression.  Social history. He lives with his mother.  Past Medical History:  Past Medical History:  Diagnosis Date  . Alcohol abuse   . Bronchitis   . Depression   . Diabetes mellitus without complication ([place])   . GERD (gastroesophageal reflux disease)   . Gout   . Hypertension   . Morbid obesity ([place])   . Suicide ideation     Past Surgical History:  Procedure Laterality Date  . none     Family History:  Family History  Problem Relation Age of Onset  . Hypertension    . Diabetes Mellitus II      Social History:  History  Alcohol Use  . 0.0 oz/week    Comment: occasional     History  Drug Use  . Frequency: 1.0 time per week  . Types: Marijuana    Social History   Social History  . Marital status: Divorced    Spouse name: N/A  . Number of children: N/A  . Years of education: N/A   Social History Main Topics  . Smoking status: Current Every Day Smoker    Packs/day: 0.50    Types: Cigarettes  . Smokeless tobacco: Never Used  . Alcohol use 0.0 oz/week  Comment: occasional  . Drug use:     Frequency: 1.0 time per week    Types: Marijuana  . Sexual activity: Not Asked   Other Topics Concern  . None   Social History Narrative  . None    Hospital Course:    Alan Eaton is a 40 year old male with a history od depression, anxiety, mood instability and alcoholism.  He was admitted about a week ago after he became depressed and suicidal because his dog passed away. He comes back again is  stating that his wife and was purging and kill one of his children and kill herself and one of his other children is in the hospital. Apparently this information is completely inaccurate. Patient has a tendency of making up stories like this per [facility] collateral information.   1. Suicidal ideation. The patient is abe to contract for safety in the hospital.   2. Mood. The patient is ready to start antidepressants. We will prescribe Celexa for depression and Trazodone for sleep.  3. HTN. He is on Metoprolol, Losartan and ASA.  4. Gout. He is on Allopurinol.  5. Diabetes. He is on Metformin, ADA diet and blood glucose monitoring..  6. GERD. He is on Pepcid.  7. Alcohol abuse. The patient relapsed onalcohiol on the night of admission. No detox necessary. He has been sober lately. He will continue with [facility] IOP program.  8. Smoking. Nicotine patch is available.  9. Disposition. He will be discharged to home. He will follow up with [facility].  Collateral information was obtained from the patient's mother by our Education officer, museum. It was confirmed that the information he provided about the murder suicide is completely inaccurate. Patient does not have any family other than an uncle in [place] and he does not have any children.  Patient was confronted about this he just stated that his mother was not aware of the details saying he just didn't want to talk about it anymore.  Collateral information obtained from [facility] the patient has made up stories there about being a fireman which is also inaccurate  On discharge patient reports feeling significantly improved. Denies suicidality, homicidality or auditory or visual hallucinations. He denies side effects from medications. He denies any physical complaints. He has not displayed any disruptive or unsafe behaviors here in the unit. He has been compliant with groups and medications.  Staff who has been working with the patient does not have any  concerns about the patient's safety upon discharge.   Physical Findings: AIMS:  , ,  ,  ,    CIWA:    COWS:     Musculoskeletal: Strength & Muscle Tone: within normal limits Gait & Station: normal Patient leans: N/A  Psychiatric Specialty Exam: Physical Exam  Constitutional: He is oriented to person, place, and time. He appears well-developed and well-nourished.  HENT:  Head: Normocephalic and atraumatic.  Eyes: EOM are normal.  Neck: Normal range of motion.  Respiratory: Effort normal.  Musculoskeletal: Normal range of motion.  Neurological: He is alert and oriented to person, place, and time.    Review of Systems  Constitutional: Negative.   HENT: Negative.   Eyes: Negative.   Respiratory: Negative.   Cardiovascular: Negative.   Gastrointestinal: Negative.   Genitourinary: Negative.   Musculoskeletal: Negative.   Skin: Negative.   Neurological: Negative.   Endo/Heme/Allergies: Negative.   Psychiatric/Behavioral: Positive for depression and substance abuse.    Blood pressure (!) 157/99, pulse 74, temperature 97.7 F (36.5 C),  temperature source Oral, resp. rate 18, height _0  (1.854 m), weight 104.3 kg (230 lb), SpO2 100 %.Body mass index is 30.34 kg/m.  General Appearance: Well Groomed  Eye Contact:  Good  Speech:  Clear and Coherent  Volume:  Normal  Mood:  Euthymic  Affect:  Appropriate  Thought Process:  Linear and Descriptions of Associations: Intact  Orientation:  Full (Time, Place, and Person)  Thought Content:  Hallucinations: None  Suicidal Thoughts:  No  Homicidal Thoughts:  No  Memory:  Immediate;   Fair Recent;   Fair Remote;   Fair  Judgement:  Poor  Insight:  Shallow  Psychomotor Activity:  Normal  Concentration:  Concentration: Fair and Attention Span: Fair  Recall:  [employer] of Knowledge:  Fair  Language:  Fair  Akathisia:  No  Handed:    AIMS (if indicated):     Assets:  Communication Skills Physical Health  ADL's:  Intact   Cognition:  WNL  Sleep:  Number of Hours: 6.25     Have you used any form of tobacco in the last 30 days? (Cigarettes, Smokeless Tobacco, Cigars, and/or Pipes): Yes  Has this patient used any form of tobacco in the last 30 days? (Cigarettes, Smokeless Tobacco, Cigars, and/or Pipes) Yes, Yes, A prescription for an FDA-approved tobacco cessation medication was offered at discharge and the patient refused  Blood Alcohol level:  Lab Results  Component Value Date   ETH 174 (H) 01/11/2016   ETH 228 (H) 52/77/8242    Metabolic Disorder Labs:  Lab Results  Component Value Date   HGBA1C 6.0 (H) 01/03/2016   MPG 126 01/03/2016   No results found for: PROLACTIN Lab Results  Component Value Date   CHOL 140 01/03/2016   TRIG 133 01/03/2016   HDL 36 (L) 01/03/2016   CHOLHDL 3.9 01/03/2016   VLDL 27 01/03/2016   LDLCALC 77 01/03/2016   Results for ARMANI, BRAR (MRN [id number]) as of 01/13/2016 08:56  Ref. Range 01/02/2016 16:19 01/03/2016 06:51 01/11/2016 00:11  Sodium Latest Ref Range: 135 - 145 mmol/L 138  135  Potassium Latest Ref Range: 3.5 - 5.1 mmol/L 3.9  3.6  Chloride Latest Ref Range: 101 - 111 mmol/L 106  102  CO2 Latest Ref Range: 22 - 32 mmol/L 25  23  Mean Plasma Glucose Latest Units: mg/dL  126   BUN Latest Ref Range: 6 - 20 mg/dL 9  9  Creatinine Latest Ref Range: 0.61 - 1.24 mg/dL 1.32 (H)  1.39 (H)  Calcium Latest Ref Range: 8.9 - 10.3 mg/dL 9.3  8.8 (L)  EGFR (Non-African Amer.) Latest Ref Range: >60 mL/min >60  >60  EGFR (African American) Latest Ref Range: >60 mL/min >60  >60  Glucose Latest Ref Range: 65 - 99 mg/dL 94  98  Anion gap Latest Ref Range: 5 - _1 Alkaline Phosphatase Latest Ref Range: 38 - 126 U/L 49  51  Albumin Latest Ref Range: 3.5 - 5.0 g/dL 4.3  4.1  AST Latest Ref Range: 15 - 41 U/L 44 (H)  29  ALT Latest Ref Range: 17 - 63 U/L 68 (H)  46  Total Protein Latest Ref Range: 6.5 - 8.1 g/dL 7.7  7.7  Total Bilirubin Latest Ref Range:  0.3 - 1.2 mg/dL 0.5  0.6  Cholesterol Latest Ref Range: 0 - 200 mg/dL  140   Triglycerides Latest Ref Range: <150 mg/dL  133   HDL Cholesterol Latest Ref Range: >  40 mg/dL  36 (L)   LDL (calc) Latest Ref Range: 0 - 99 mg/dL  77   VLDL Latest Ref Range: 0 - 40 mg/dL  27   Total CHOL/HDL Ratio Latest Units: RATIO  3.9   WBC Latest Ref Range: 3.8 - 10.6 K/uL 9.0  11.4 (H)  RBC Latest Ref Range: 4.40 - 5.90 MIL/uL 4.82  4.79  Hemoglobin Latest Ref Range: 13.0 - 18.0 g/dL 14.7  14.7  HCT Latest Ref Range: 40.0 - 52.0 % 43.3  42.9  MCV Latest Ref Range: 80.0 - 100.0 fL 89.7  89.6  MCH Latest Ref Range: 26.0 - 34.0 pg 30.5  30.6  MCHC Latest Ref Range: 32.0 - 36.0 g/dL 34.0  34.1  RDW Latest Ref Range: 11.5 - 14.5 % 15.0 (H)  15.0 (H)  Platelets Latest Ref Range: 150 - 440 K/uL 191  230  Neutrophils Latest Units: % 55  47  Lymphocytes Latest Units: % 31  40  Monocytes Relative Latest Units: % 9  7  Eosinophil Latest Units: % 4  5  Basophil Latest Units: % 1  1  NEUT# Latest Ref Range: 1.4 - 6.5 K/uL 5.0  5.3  Lymphocyte # Latest Ref Range: 1.0 - 3.6 K/uL 2.8  4.6 (H)  Monocyte # Latest Ref Range: 0.2 - 1.0 K/uL 0.8  0.8  Eosinophils Absolute Latest Ref Range: 0 - 0.7 K/uL 0.3  0.6  Basophils Absolute Latest Ref Range: 0 - 0.1 K/uL 0.1  0.1  Acetaminophen (Tylenol), S Latest Ref Range: 10 - 30 ug/mL   <03 (L)  Salicylate Lvl Latest Ref Range: 2.8 - 30.0 mg/dL   <7.0  Hemoglobin A1C Latest Ref Range: 4.8 - 5.6 %  6.0 (H)   TSH Latest Ref Range: 0.350 - 4.500 uIU/mL  2.377    Results for ALEE, KATEN (MRN [id number]) as of 01/13/2016 08:56  Ref. Range 01/11/2016 00:11 01/11/2016 03:11 01/11/2016 10:56  Alcohol, Ethyl (B) Latest Ref Range: <5 mg/dL 228 (H) 174 (H)   Amphetamines, Ur Screen Latest Ref Range: NONE DETECTED    NONE DETECTED  Barbiturates, Ur Screen Latest Ref Range: NONE DETECTED    NONE DETECTED  Benzodiazepine, Ur Scrn Latest Ref Range: NONE DETECTED    NONE DETECTED   Cocaine Metabolite,Ur [place] Latest Ref Range: NONE DETECTED    NONE DETECTED  Methadone Scn, Ur Latest Ref Range: NONE DETECTED    NONE DETECTED  MDMA (Ecstasy)Ur Screen Latest Ref Range: NONE DETECTED    NONE DETECTED  Cannabinoid 50 Ng, Ur [place] Latest Ref Range: NONE DETECTED    NONE DETECTED  Opiate, Ur Screen Latest Ref Range: NONE DETECTED    NONE DETECTED  Phencyclidine (PCP) Ur S Latest Ref Range: NONE DETECTED    NONE DETECTED  Tricyclic, Ur Screen Latest Ref Range: NONE DETECTED    NONE DETECTED   See Psychiatric Specialty Exam and Suicide Risk Assessment completed by Attending Physician prior to discharge.  Discharge destination:  Home  Is patient on multiple antipsychotic therapies at discharge:  No   Has Patient had three or more failed trials of antipsychotic monotherapy by history:  No  Recommended Plan for Multiple Antipsychotic Therapies: NA  Discharge Instructions    Diet - low sodium heart healthy    Complete by:  As directed        Medication List    STOP taking these medications   accu-chek softclix lancets   B-complex with vitamin C tablet   glucose  blood test strip Commonly known as:  ACCU-CHEK AVIVA PLUS   hydrOXYzine 25 MG tablet Commonly known as:  ATARAX/VISTARIL   multivitamin with minerals tablet     TAKE these medications     Indication  allopurinol 100 MG tablet Commonly known as:  ZYLOPRIM Take 100 mg by mouth daily. What changed:  Another medication with the same name was removed. Continue taking this medication, and follow the directions you see here.  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  Indication:  Inflammation   citalopram 20 MG tablet Commonly known as:  CELEXA Take 1 tablet (20 mg total) by mouth at bedtime.  Indication:  depression   famotidine 40 MG tablet Commonly known as:  PEPCID Take 40 mg by mouth at bedtime.  Indication:  Heartburn   losartan 100  MG tablet Commonly known as:  COZAAR Take 100 mg by mouth daily.  Indication:  High Blood Pressure Disorder   meloxicam 7.5 MG tablet Commonly known as:  MOBIC Take 7.5 mg by mouth daily.  Indication:  Joint Damage causing Pain and Loss of Function   metFORMIN 500 MG tablet Commonly known as:  GLUCOPHAGE TAKE 1 TABLET BY MOUTH ONCE DAILY AFTER A MEAL.  Indication:  Type 2 Diabetes   metoprolol succinate 50 MG 24 hr tablet Commonly known as:  TOPROL-XL Take 50 mg by mouth daily. Take with or immediately following a meal.  Indication:  High Blood Pressure Disorder   traZODone 100 MG tablet Commonly known as:  DESYREL Take 1 tablet (100 mg total) by mouth at bedtime as needed for sleep.  Indication:  Trouble Sleeping   VENTOLIN HFA 108 (90 Base) MCG/ACT inhaler Generic drug:  albuterol INHALE 2 PUFFS BY MOUTH EVERY 4 TO 6 HOURS AS NEEDED.  Indication:  Chronic Obstructive Lung Disease       >30 minutes. >50 % of the time was spent in coordination of care  Signed: Hildred Priest, MD 01/13/2016, 8:55 AM

## 2016-01-13 NOTE — [facility] Suicide Risk Assessment (Signed)
[facility] INPATIENT:  Family/Significant Other Suicide Prevention Education  Suicide Prevention Education:  Education Completed; Melburn Popper, Pt's mother, [phone],  (name of family member/significant other) has been identified by the patient as the family member/significant other with whom the patient will be residing, and identified as the person(s) who will aid the patient in the event of a mental health crisis (suicidal ideations/suicide attempt).  With written consent from the patient, the family member/significant other has been provided the following suicide prevention education, prior to the and/or following the discharge of the patient.  The suicide prevention education provided includes the following:  Suicide risk factors  Suicide prevention and interventions  National Suicide Hotline telephone number  [facility] assessment telephone number  [facility] Emergency Assistance [place] and/or Residential Mobile Crisis Unit telephone number  Request made of family/significant other to:  Remove weapons (e.g., guns, rifles, knives), all items previously/currently identified as safety concern.    Remove drugs/medications (over-the-counter, prescriptions, illicit drugs), all items previously/currently identified as a safety concern.  The family member/significant other verbalizes understanding of the suicide prevention education information provided.  The family member/significant other agrees to remove the items of safety concern listed above.  August Saucer, MSW, LCSW 01/13/2016, 11:00 AM

## 2016-01-13 NOTE — Progress Notes (Signed)
Provided and reviewed discharge paperwork and prescriptions. Verified understanding by use of teach back method.Verbalized understanding. Pt specific belongings returned to include: sunglasses, keys, personal photos, open food package (pork skins), shoe strings. Pt's mother to pick up pt after 1600. Safety maintained with every 15 minute checks. Will continue to monitor.

## 2016-01-13 NOTE — Plan of Care (Signed)
Problem: Safety: Goal: Periods of time without injury will increase Outcome: Progressing Patient has been without injury during this shift.

## 2016-01-13 NOTE — [facility] Suicide Risk Assessment (Signed)
[facility] Discharge Suicide Risk Assessment   Principal Problem: Depression Discharge Diagnoses:  Patient Active Problem List   Diagnosis Date Noted  . 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 Unspecified depressive disorder [F32.9] 01/13/2016  . Alcohol use disorder, severe, dependence ([place]) [F10.20] 01/13/2016  . 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 HTN (hypertension) [I10] 01/05/2016  . Gout [M10.9] 01/05/2016  . GERD (gastroesophageal reflux disease) [K21.9] 01/05/2016  . Diabetes ([place]) [E11.9] 01/05/2016  . Tobacco use disorder [F17.200] 01/05/2016  . Obesity [E66.9] 10/27/2015      Psychiatric Specialty Exam: ROS  Blood pressure (!) 157/99, pulse 74, temperature 97.7 F (36.5 C), temperature source Oral, resp. rate 18, height 6\' 1"  (1.854 m), weight 104.3 kg (230 lb), SpO2 100 %.Body mass index is 30.34 kg/m.                                                       Mental Status Per Nursing Assessment::   On Admission:  NA  Demographic Factors:  Male, Caucasian, Low socioeconomic status and Unemployed  Loss Factors: Financial problems/change in socioeconomic status  Historical Factors: Impulsivity  Risk Reduction Factors:   Positive social support  Continued Clinical Symptoms:  Alcohol/Substance Abuse/Dependencies Previous Psychiatric Diagnoses and Treatments  Cognitive Features That Contribute To Risk:  Closed-mindedness    Suicide Risk:  Minimal: No identifiable suicidal ideation.  Patients presenting with no risk factors but with morbid ruminations; may be classified as minimal risk based on the severity of the depressive symptoms     Hildred Priest, MD 01/13/2016, 8:54 AM

## 2016-01-13 NOTE — Progress Notes (Signed)
Pt awake, alert, oriented, smiling this morning. Appropriately interacts with peers/staff. Pleasant, calm and cooperative. Smiles on approach. Denies SI/HI/AVH, pain. States he came to the hospital because he was with some friends and relapsed by drinking alcohol. Reports good sleep last night with the help of medication. Good appetite, normal energy level, good concentration. Rates depression 0/10, anxiety 0/10, hopelessness 0/10 (low 0-10 high). Denies withdrawing from alcohol and drugs, no withdrawal effects noted. Medication complaint.   Safety maintained with every 15 minute checks. Support and encouragement provided with use of therapeutic communication. Medication administered as ordered with education. Will continue to monitor.

## 2016-01-13 NOTE — Plan of Care (Signed)
Problem: [facility] Participation in Recreation Therapeutic Interventions Goal: STG-Patient will identify at least five coping skills for ** STG: Coping Skills - Within 3 treatment sessions, patient will verbalize at least 5 coping skills for anger in one treatment session to increase anger management skills post d/c.  Outcome: Completed/Met Date Met: 01/13/16 Treatment Session 1; Completed 1 out of 1: At approximately 11:35 am, LRT met with patient in craft room. Patient verbalized 5 coping skills for anger. Patient verbalized what triggers him to get angry, how his body responds to anger, and how he is going to remind himself to use his coping skills. LRT provided suggestions as well.  Leonette Monarch, LRT/CTRS 10.24.17 11:53 am Goal: STG-Other Recreation Therapy Goal (Specify) STG: Stress Management - Within 3 treatment sessions, patient will verbalize understanding of the stress management techniques in one treatment session to increase stress management skills post d/c.  Outcome: Completed/Met Date Met: 01/13/16 Treatment Session 1; Completed 1 out of 1: At approximately 11:35 am, LRT met with patient in craft room. LRT educated and provided patient with handouts on stress management techniques. Patient verbalized understanding. LRT encouraged patient to read over and practice the stress management techniques.  Leonette Monarch, LRT/CTRS 10.24.17 11:54 am

## 2016-01-20 ENCOUNTER — Other Ambulatory Visit: Payer: Self-pay | Admitting: Family Medicine

## 2016-01-20 ENCOUNTER — Encounter: Payer: Self-pay | Admitting: Family Medicine

## 2016-01-20 ENCOUNTER — Ambulatory Visit (INDEPENDENT_AMBULATORY_CARE_PROVIDER_SITE_OTHER): Payer: Medicare Other | Admitting: Family Medicine

## 2016-01-20 ENCOUNTER — Encounter: Payer: Self-pay | Admitting: *Deleted

## 2016-01-20 VITALS — BP 152/97 | HR 114 | Temp 99.0°F | Resp 18 | Ht 74.0 in | Wt 327.0 lb

## 2016-01-20 DIAGNOSIS — J020 Streptococcal pharyngitis: Secondary | ICD-10-CM

## 2016-01-20 DIAGNOSIS — J439 Emphysema, unspecified: Secondary | ICD-10-CM

## 2016-01-20 DIAGNOSIS — J029 Acute pharyngitis, unspecified: Secondary | ICD-10-CM | POA: Diagnosis not present

## 2016-01-20 LAB — POCT RAPID STREP A (OFFICE): Rapid Strep A Screen: POSITIVE — AB

## 2016-01-20 MED ORDER — PENICILLIN V POTASSIUM 500 MG PO TABS: 500.0000 mg | ORAL_TABLET | Freq: Four times a day (QID) | ORAL | 0 refills | Status: DC

## 2016-01-20 MED ORDER — RESPIRATORY THERAPY SUPPLIES DEVI: 1.0000 | 0 refills | Status: DC | PRN

## 2016-01-20 NOTE — Progress Notes (Signed)
Name: Alan Eaton   MRN: [id number]    DOB: 04-Sep-1975   Date:01/20/2016       Progress Note  Subjective  Chief Complaint  Chief Complaint  Patient presents with  . Sore Throat    HPI Sore throat for 24 hrs.  Painful to swallow.  Neck is sore.  No fever.  No chills/aches.  No cough  No problem-specific Assessment & Plan notes found for this encounter.   Past Medical History:  Diagnosis Date  . Alcohol abuse   . Bronchitis   . Depression   . Diabetes mellitus without complication ([place])   . GERD (gastroesophageal reflux disease)   . Gout   . Hypertension   . Morbid obesity ([place])   . Suicide ideation     Social History  Substance Use Topics  . Smoking status: Current Every Day Smoker    Packs/day: 0.50    Types: Cigarettes  . Smokeless tobacco: Never Used  . Alcohol use 0.0 oz/week     Comment: occasional     Current Outpatient Prescriptions:  .  allopurinol (ZYLOPRIM) 100 MG tablet, Take 100 mg by mouth daily., Disp: , Rfl:  .  aspirin EC 81 MG tablet, Take 81 mg by mouth daily., Disp: , Rfl:  .  citalopram (CELEXA) 20 MG tablet, Take 1 tablet (20 mg total) by mouth at bedtime., Disp: 30 tablet, Rfl: 0 .  colchicine 0.6 MG tablet, Take 0.6 mg by mouth daily as needed., Disp: , Rfl:  .  famotidine (PEPCID) 40 MG tablet, Take 40 mg by mouth at bedtime., Disp: , Rfl:  .  losartan (COZAAR) 100 MG tablet, Take 100 mg by mouth daily., Disp: , Rfl:  .  meloxicam (MOBIC) 7.5 MG tablet, Take 7.5 mg by mouth daily., Disp: , Rfl:  .  metFORMIN (GLUCOPHAGE) 500 MG tablet, TAKE 1 TABLET BY MOUTH ONCE DAILY AFTER A MEAL., Disp: 90 tablet, Rfl: 3 .  metoprolol succinate (TOPROL-XL) 50 MG 24 hr tablet, Take 50 mg by mouth daily. Take with or immediately following a meal., Disp: , Rfl:  .  traZODone (DESYREL) 100 MG tablet, Take 1 tablet (100 mg total) by mouth at bedtime as needed for sleep., Disp: 30 tablet, Rfl: 1 .  VENTOLIN HFA 108 (90 Base) MCG/ACT inhaler, INHALE 2 PUFFS  BY MOUTH EVERY 4 TO 6 HOURS AS NEEDED., Disp: 18 g, Rfl: 12  Allergies  Allergen Reactions  . Bee Venom Swelling  . Bactrim [Sulfamethoxazole-Trimethoprim] Swelling and Rash    Review of Systems  Constitutional: Positive for malaise/fatigue and weight loss. Negative for chills and fever.  HENT: Positive for sore throat. Negative for congestion and hearing loss.   Eyes: Negative.   Respiratory: Negative.   Cardiovascular: Negative.   Gastrointestinal: Negative.   Genitourinary: Negative.   Musculoskeletal: Negative.   Skin: Negative for rash.  Neurological: Negative for weakness and headaches.      Objective  Vitals:   01/20/16 1518  BP: (!) 152/97  Pulse: (!) 114  Resp: 18  Temp: 99 F (37.2 C)  TempSrc: Oral  Weight: (!) 327 lb (148.3 kg)  Height: 6' 2" (1.88 m)     Physical Exam  Constitutional: He is well-developed, well-nourished, and in no distress. No distress.  HENT:  Head: Normocephalic and atraumatic.  Right Ear: External ear normal.  Left Ear: External ear normal.  Nose: Nose normal.  Mouth/Throat: Posterior oropharyngeal edema and posterior oropharyngeal erythema present. No oropharyngeal exudate.  Cardiovascular: Normal rate,  regular rhythm and normal heart sounds.   Pulmonary/Chest: Effort normal and breath sounds normal. No respiratory distress. He has no wheezes. He has no rales.  Vitals reviewed.     Recent Results (from the past 2160 hour(s))  Glucose, capillary     Status: None   Collection Time: 10/27/15  3:30 AM  Result Value Ref Range   Glucose-Capillary 92 65 - 99 mg/dL  Ethanol     Status: Abnormal   Collection Time: 10/27/15  4:48 AM  Result Value Ref Range   Alcohol, Ethyl (B) 198 (H) <5 mg/dL    Comment:        LOWEST DETECTABLE LIMIT FOR SERUM ALCOHOL IS 5 mg/dL FOR MEDICAL PURPOSES ONLY   Urine Drug Screen, Qualitative ([facility] only)     Status: None   Collection Time: 10/27/15  4:48 AM  Result Value Ref Range   Tricyclic,  Ur Screen NONE DETECTED NONE DETECTED   Amphetamines, Ur Screen NONE DETECTED NONE DETECTED   MDMA (Ecstasy)Ur Screen NONE DETECTED NONE DETECTED   Cocaine Metabolite,Ur [place] NONE DETECTED NONE DETECTED   Opiate, Ur Screen NONE DETECTED NONE DETECTED   Phencyclidine (PCP) Ur S NONE DETECTED NONE DETECTED   Cannabinoid 50 Ng, Ur [place] NONE DETECTED NONE DETECTED   Barbiturates, Ur Screen NONE DETECTED NONE DETECTED   Benzodiazepine, Ur Scrn NONE DETECTED NONE DETECTED   Methadone Scn, Ur NONE DETECTED NONE DETECTED    Comment: (NOTE) 161  Tricyclics, urine               Cutoff 1000 ng/mL 200  Amphetamines, urine             Cutoff 1000 ng/mL 300  MDMA (Ecstasy), urine           Cutoff 500 ng/mL 400  Cocaine Metabolite, urine       Cutoff 300 ng/mL 500  Opiate, urine                   Cutoff 300 ng/mL 600  Phencyclidine (PCP), urine      Cutoff 25 ng/mL 700  Cannabinoid, urine              Cutoff 50 ng/mL 800  Barbiturates, urine             Cutoff 200 ng/mL 900  Benzodiazepine, urine           Cutoff 200 ng/mL 1000 Methadone, urine                Cutoff 300 ng/mL 1100 1200 The urine drug screen provides only a preliminary, unconfirmed 1300 analytical test result and should not be used for non-medical 1400 purposes. Clinical consideration and professional judgment should 1500 be applied to any positive drug screen result due to possible 1600 interfering substances. A more specific alternate chemical method 1700 must be used in order to obtain a confirmed analytical result.  1800 Gas chromato graphy / mass spectrometry (GC/MS) is the preferred 1900 confirmatory method.   Comprehensive metabolic panel     Status: None   Collection Time: 10/27/15  4:48 AM  Result Value Ref Range   Sodium 140 135 - 145 mmol/L   Potassium 4.0 3.5 - 5.1 mmol/L   Chloride 108 101 - 111 mmol/L   CO2 24 22 - 32 mmol/L   Glucose, Bld 86 65 - 99 mg/dL   BUN 6 6 - 20 mg/dL   Creatinine, Ser 1.11 0.61 - 1.24  mg/dL  Calcium 9.1 8.9 - 10.3 mg/dL   Total Protein 7.5 6.5 - 8.1 g/dL   Albumin 4.1 3.5 - 5.0 g/dL   AST 38 15 - 41 U/L   ALT 51 17 - 63 U/L   Alkaline Phosphatase 59 38 - 126 U/L   Total Bilirubin 0.3 0.3 - 1.2 mg/dL   GFR calc non Af Amer >60 >60 mL/min   GFR calc Af Amer >60 >60 mL/min    Comment: (NOTE) The eGFR has been calculated using the CKD EPI equation. This calculation has not been validated in all clinical situations. eGFR's persistently <60 mL/min signify possible Chronic Kidney Disease.    Anion gap 8 5 - 15  CBC with Differential/Platelet     Status: Abnormal   Collection Time: 10/27/15  4:48 AM  Result Value Ref Range   WBC 10.2 3.8 - 10.6 K/uL   RBC 5.02 4.40 - 5.90 MIL/uL   Hemoglobin 15.5 13.0 - 18.0 g/dL   HCT 44.9 40.0 - 52.0 %   MCV 89.4 80.0 - 100.0 fL   MCH 30.9 26.0 - 34.0 pg   MCHC 34.5 32.0 - 36.0 g/dL   RDW 15.8 (H) 11.5 - 14.5 %   Platelets 237 150 - 440 K/uL   Neutrophils Relative % 42 %   Neutro Abs 4.3 1.4 - 6.5 K/uL   Lymphocytes Relative 45 %   Lymphs Abs 4.6 (H) 1.0 - 3.6 K/uL   Monocytes Relative 8 %   Monocytes Absolute 0.8 0.2 - 1.0 K/uL   Eosinophils Relative 5 %   Eosinophils Absolute 0.5 0 - 0.7 K/uL   Basophils Relative 0 %   Basophils Absolute 0.0 0 - 0.1 K/uL  CBC with Differential/Platelet     Status: Abnormal   Collection Time: 01/02/16  4:19 PM  Result Value Ref Range   WBC 9.0 3.8 - 10.6 K/uL   RBC 4.82 4.40 - 5.90 MIL/uL   Hemoglobin 14.7 13.0 - 18.0 g/dL   HCT 43.3 40.0 - 52.0 %   MCV 89.7 80.0 - 100.0 fL   MCH 30.5 26.0 - 34.0 pg   MCHC 34.0 32.0 - 36.0 g/dL   RDW 15.0 (H) 11.5 - 14.5 %   Platelets 191 150 - 440 K/uL   Neutrophils Relative % 55 %   Neutro Abs 5.0 1.4 - 6.5 K/uL   Lymphocytes Relative 31 %   Lymphs Abs 2.8 1.0 - 3.6 K/uL   Monocytes Relative 9 %   Monocytes Absolute 0.8 0.2 - 1.0 K/uL   Eosinophils Relative 4 %   Eosinophils Absolute 0.3 0 - 0.7 K/uL   Basophils Relative 1 %   Basophils  Absolute 0.1 0 - 0.1 K/uL  Comprehensive metabolic panel     Status: Abnormal   Collection Time: 01/02/16  4:19 PM  Result Value Ref Range   Sodium 138 135 - 145 mmol/L   Potassium 3.9 3.5 - 5.1 mmol/L   Chloride 106 101 - 111 mmol/L   CO2 25 22 - 32 mmol/L   Glucose, Bld 94 65 - 99 mg/dL   BUN 9 6 - 20 mg/dL   Creatinine, Ser 1.32 (H) 0.61 - 1.24 mg/dL   Calcium 9.3 8.9 - 10.3 mg/dL   Total Protein 7.7 6.5 - 8.1 g/dL   Albumin 4.3 3.5 - 5.0 g/dL   AST 44 (H) 15 - 41 U/L   ALT 68 (H) 17 - 63 U/L   Alkaline Phosphatase 49 38 - 126 U/L  Total Bilirubin 0.5 0.3 - 1.2 mg/dL   GFR calc non Af Amer >60 >60 mL/min   GFR calc Af Amer >60 >60 mL/min    Comment: (NOTE) The eGFR has been calculated using the CKD EPI equation. This calculation has not been validated in all clinical situations. eGFR's persistently <60 mL/min signify possible Chronic Kidney Disease.    Anion gap 7 5 - 15  Ethanol     Status: None   Collection Time: 01/02/16  4:19 PM  Result Value Ref Range   Alcohol, Ethyl (B) <5 <5 mg/dL    Comment:        LOWEST DETECTABLE LIMIT FOR SERUM ALCOHOL IS 5 mg/dL FOR MEDICAL PURPOSES ONLY   Urine Drug Screen, Qualitative ([facility] only)     Status: Abnormal   Collection Time: 01/02/16  4:21 PM  Result Value Ref Range   Tricyclic, Ur Screen NONE DETECTED NONE DETECTED   Amphetamines, Ur Screen NONE DETECTED NONE DETECTED   MDMA (Ecstasy)Ur Screen NONE DETECTED NONE DETECTED   Cocaine Metabolite,Ur [place] NONE DETECTED NONE DETECTED   Opiate, Ur Screen NONE DETECTED NONE DETECTED   Phencyclidine (PCP) Ur S NONE DETECTED NONE DETECTED   Cannabinoid 50 Ng, Ur [place] NONE DETECTED NONE DETECTED   Barbiturates, Ur Screen NONE DETECTED NONE DETECTED   Benzodiazepine, Ur Scrn POSITIVE (A) NONE DETECTED   Methadone Scn, Ur NONE DETECTED NONE DETECTED    Comment: (NOTE) 517  Tricyclics, urine               Cutoff 1000 ng/mL 200  Amphetamines, urine             Cutoff 1000 ng/mL 300   MDMA (Ecstasy), urine           Cutoff 500 ng/mL 400  Cocaine Metabolite, urine       Cutoff 300 ng/mL 500  Opiate, urine                   Cutoff 300 ng/mL 600  Phencyclidine (PCP), urine      Cutoff 25 ng/mL 700  Cannabinoid, urine              Cutoff 50 ng/mL 800  Barbiturates, urine             Cutoff 200 ng/mL 900  Benzodiazepine, urine           Cutoff 200 ng/mL 1000 Methadone, urine                Cutoff 300 ng/mL 1100 1200 The urine drug screen provides only a preliminary, unconfirmed 1300 analytical test result and should not be used for non-medical 1400 purposes. Clinical consideration and professional judgment should 1500 be applied to any positive drug screen result due to possible 1600 interfering substances. A more specific alternate chemical method 1700 must be used in order to obtain a confirmed analytical result.  1800 Gas chromato graphy / mass spectrometry (GC/MS) is the preferred 1900 confirmatory method.   Hemoglobin A1c     Status: Abnormal   Collection Time: 01/03/16  6:51 AM  Result Value Ref Range   Hgb A1c MFr Bld 6.0 (H) 4.8 - 5.6 %    Comment: (NOTE)         Pre-diabetes: 5.7 - 6.4         Diabetes: >6.4         Glycemic control for adults with diabetes: <7.0    Mean Plasma Glucose 126 mg/dL  Comment: (NOTE) Performed At: [facility] [place], [place] [id number] Lindon Romp MD OE:[id number]   Lipid panel     Status: Abnormal   Collection Time: 01/03/16  6:51 AM  Result Value Ref Range   Cholesterol 140 0 - 200 mg/dL   Triglycerides 133 <150 mg/dL   HDL 36 (L) >40 mg/dL   Total CHOL/HDL Ratio 3.9 RATIO   VLDL 27 0 - 40 mg/dL   LDL Cholesterol 77 0 - 99 mg/dL    Comment:        Total Cholesterol/HDL:CHD Risk Coronary Heart Disease Risk Table                     Men   Women  1/2 Average Risk   3.4   3.3  Average Risk       5.0   4.4  2 X Average Risk   9.6   7.1  3 X Average Risk  23.4   11.0        Use the  calculated Patient Ratio above and the CHD Risk Table to determine the patient's CHD Risk.        ATP III CLASSIFICATION (LDL):  <100     mg/dL   Optimal  100-129  mg/dL   Near or Above                    Optimal  130-159  mg/dL   Borderline  160-189  mg/dL   High  >190     mg/dL   Very High   TSH     Status: None   Collection Time: 01/03/16  6:51 AM  Result Value Ref Range   TSH 2.377 0.350 - 4.500 uIU/mL    Comment: Performed by a 3rd Generation assay with a functional sensitivity of <=0.01 uIU/mL.  Ethanol     Status: Abnormal   Collection Time: 01/11/16 12:11 AM  Result Value Ref Range   Alcohol, Ethyl (B) 228 (H) <5 mg/dL    Comment:        LOWEST DETECTABLE LIMIT FOR SERUM ALCOHOL IS 5 mg/dL FOR MEDICAL PURPOSES ONLY   CBC with Differential/Platelet     Status: Abnormal   Collection Time: 01/11/16 12:11 AM  Result Value Ref Range   WBC 11.4 (H) 3.8 - 10.6 K/uL   RBC 4.79 4.40 - 5.90 MIL/uL   Hemoglobin 14.7 13.0 - 18.0 g/dL   HCT 42.9 40.0 - 52.0 %   MCV 89.6 80.0 - 100.0 fL   MCH 30.6 26.0 - 34.0 pg   MCHC 34.1 32.0 - 36.0 g/dL   RDW 15.0 (H) 11.5 - 14.5 %   Platelets 230 150 - 440 K/uL   Neutrophils Relative % 47 %   Neutro Abs 5.3 1.4 - 6.5 K/uL   Lymphocytes Relative 40 %   Lymphs Abs 4.6 (H) 1.0 - 3.6 K/uL   Monocytes Relative 7 %   Monocytes Absolute 0.8 0.2 - 1.0 K/uL   Eosinophils Relative 5 %   Eosinophils Absolute 0.6 0 - 0.7 K/uL   Basophils Relative 1 %   Basophils Absolute 0.1 0 - 0.1 K/uL  Comprehensive metabolic panel     Status: Abnormal   Collection Time: 01/11/16 12:11 AM  Result Value Ref Range   Sodium 135 135 - 145 mmol/L   Potassium 3.6 3.5 - 5.1 mmol/L   Chloride 102 101 - 111 mmol/L   CO2 23 22 - 32  mmol/L   Glucose, Bld 98 65 - 99 mg/dL   BUN 9 6 - 20 mg/dL   Creatinine, Ser 1.39 (H) 0.61 - 1.24 mg/dL   Calcium 8.8 (L) 8.9 - 10.3 mg/dL   Total Protein 7.7 6.5 - 8.1 g/dL   Albumin 4.1 3.5 - 5.0 g/dL   AST 29 15 - 41 U/L   ALT  46 17 - 63 U/L   Alkaline Phosphatase 51 38 - 126 U/L   Total Bilirubin 0.6 0.3 - 1.2 mg/dL   GFR calc non Af Amer >60 >60 mL/min   GFR calc Af Amer >60 >60 mL/min    Comment: (NOTE) The eGFR has been calculated using the CKD EPI equation. This calculation has not been validated in all clinical situations. eGFR's persistently <60 mL/min signify possible Chronic Kidney Disease.    Anion gap 10 5 - 15  Salicylate level     Status: None   Collection Time: 01/11/16 12:11 AM  Result Value Ref Range   Salicylate Lvl <7.9 2.8 - 30.0 mg/dL  Acetaminophen level     Status: Abnormal   Collection Time: 01/11/16 12:11 AM  Result Value Ref Range   Acetaminophen (Tylenol), Serum <10 (L) 10 - 30 ug/mL    Comment:        THERAPEUTIC CONCENTRATIONS VARY SIGNIFICANTLY. A RANGE OF 10-30 ug/mL MAY BE AN EFFECTIVE CONCENTRATION FOR MANY PATIENTS. HOWEVER, SOME ARE BEST TREATED AT CONCENTRATIONS OUTSIDE THIS RANGE. ACETAMINOPHEN CONCENTRATIONS >150 ug/mL AT 4 HOURS AFTER INGESTION AND >50 ug/mL AT 12 HOURS AFTER INGESTION ARE OFTEN ASSOCIATED WITH TOXIC REACTIONS.   Ethanol     Status: Abnormal   Collection Time: 01/11/16  3:11 AM  Result Value Ref Range   Alcohol, Ethyl (B) 174 (H) <5 mg/dL    Comment:        LOWEST DETECTABLE LIMIT FOR SERUM ALCOHOL IS 5 mg/dL FOR MEDICAL PURPOSES ONLY   Urine Drug Screen, Qualitative ([facility] only)     Status: None   Collection Time: 01/11/16 10:56 AM  Result Value Ref Range   Tricyclic, Ur Screen NONE DETECTED NONE DETECTED   Amphetamines, Ur Screen NONE DETECTED NONE DETECTED   MDMA (Ecstasy)Ur Screen NONE DETECTED NONE DETECTED   Cocaine Metabolite,Ur [place] NONE DETECTED NONE DETECTED   Opiate, Ur Screen NONE DETECTED NONE DETECTED   Phencyclidine (PCP) Ur S NONE DETECTED NONE DETECTED   Cannabinoid 50 Ng, Ur [place] NONE DETECTED NONE DETECTED   Barbiturates, Ur Screen NONE DETECTED NONE DETECTED   Benzodiazepine, Ur Scrn NONE DETECTED NONE DETECTED    Methadone Scn, Ur NONE DETECTED NONE DETECTED    Comment: (NOTE) 024  Tricyclics, urine               Cutoff 1000 ng/mL 200  Amphetamines, urine             Cutoff 1000 ng/mL 300  MDMA (Ecstasy), urine           Cutoff 500 ng/mL 400  Cocaine Metabolite, urine       Cutoff 300 ng/mL 500  Opiate, urine                   Cutoff 300 ng/mL 600  Phencyclidine (PCP), urine      Cutoff 25 ng/mL 700  Cannabinoid, urine              Cutoff 50 ng/mL 800  Barbiturates, urine  Cutoff 200 ng/mL 900  Benzodiazepine, urine           Cutoff 200 ng/mL 1000 Methadone, urine                Cutoff 300 ng/mL 1100 1200 The urine drug screen provides only a preliminary, unconfirmed 1300 analytical test result and should not be used for non-medical 1400 purposes. Clinical consideration and professional judgment should 1500 be applied to any positive drug screen result due to possible 1600 interfering substances. A more specific alternate chemical method 1700 must be used in order to obtain a confirmed analytical result.  1800 Gas chromato graphy / mass spectrometry (GC/MS) is the preferred 1900 confirmatory method.   Glucose, capillary     Status: None   Collection Time: 01/11/16 12:00 PM  Result Value Ref Range   Glucose-Capillary 99 65 - 99 mg/dL  Glucose, capillary     Status: None   Collection Time: 01/11/16  4:22 PM  Result Value Ref Range   Glucose-Capillary 84 65 - 99 mg/dL  Glucose, capillary     Status: Abnormal   Collection Time: 01/12/16  6:52 AM  Result Value Ref Range   Glucose-Capillary 106 (H) 65 - 99 mg/dL  Glucose, capillary     Status: Abnormal   Collection Time: 01/12/16  4:17 PM  Result Value Ref Range   Glucose-Capillary 100 (H) 65 - 99 mg/dL  Glucose, capillary     Status: None   Collection Time: 01/13/16  6:33 AM  Result Value Ref Range   Glucose-Capillary 92 65 - 99 mg/dL  Glucose, capillary     Status: Abnormal   Collection Time: 01/13/16 11:24 AM  Result Value  Ref Range   Glucose-Capillary 128 (H) 65 - 99 mg/dL  POCT rapid strep A     Status: Abnormal   Collection Time: 01/20/16  4:24 PM  Result Value Ref Range   Rapid Strep A Screen Positive (A) Negative     Assessment & Plan 1. Sore throat  - POCT rapid strep A-+  2. Strep throat  - penicillin v potassium (VEETID) 500 MG tablet; Take 1 tablet (500 mg total) by mouth 4 (four) times daily.  Dispense: 40 tablet; Refill: 0

## 2016-01-21 ENCOUNTER — Other Ambulatory Visit: Payer: Self-pay | Admitting: Family Medicine

## 2016-03-31 ENCOUNTER — Emergency Department
Admission: EM | Admit: 2016-03-31 | Discharge: 2016-04-01 | Disposition: A | Discharge status: Home / Self Care | Payer: Medicare Other | Attending: Emergency Medicine | Admitting: Emergency Medicine

## 2016-03-31 ENCOUNTER — Encounter: Payer: Self-pay | Admitting: *Deleted

## 2016-03-31 DIAGNOSIS — Y909 Presence of alcohol in blood, level not specified: Secondary | ICD-10-CM | POA: Diagnosis not present

## 2016-03-31 DIAGNOSIS — I10 Essential (primary) hypertension: Secondary | ICD-10-CM | POA: Insufficient documentation

## 2016-03-31 DIAGNOSIS — Z79899 Other long term (current) drug therapy: Secondary | ICD-10-CM | POA: Insufficient documentation

## 2016-03-31 DIAGNOSIS — Z7984 Long term (current) use of oral hypoglycemic drugs: Secondary | ICD-10-CM | POA: Insufficient documentation

## 2016-03-31 DIAGNOSIS — E119 Type 2 diabetes mellitus without complications: Secondary | ICD-10-CM | POA: Diagnosis not present

## 2016-03-31 DIAGNOSIS — F1010 Alcohol abuse, uncomplicated: Secondary | ICD-10-CM

## 2016-03-31 DIAGNOSIS — F17210 Nicotine dependence, cigarettes, uncomplicated: Secondary | ICD-10-CM | POA: Diagnosis not present

## 2016-03-31 MED ORDER — FOLIC ACID 1 MG PO TABS
1.0000 mg | ORAL_TABLET | Freq: Once | ORAL | Status: AC
  Administered 2016-03-31: 1 mg via ORAL
  Filled 2016-03-31: qty 1

## 2016-03-31 MED ORDER — VITAMIN B-1 100 MG PO TABS
100.0000 mg | ORAL_TABLET | Freq: Once | ORAL | Status: AC
  Administered 2016-03-31: 100 mg via ORAL
  Filled 2016-03-31: qty 1

## 2016-03-31 NOTE — ED Provider Notes (Signed)
[facility] Emergency Department Provider Note  ____________________________________________   I have reviewed the triage vital signs and the nursing notes.   HISTORY  Chief Complaint Alcohol Problem and Addiction Problem    HPI Alan Eaton is a 41 y.o. male who has a long history of alcohol abuse. He presents today with no SI or HI, he states that he would like to be evaluated for the possibility of detox. He has had detox before.     Past Medical History:  Diagnosis Date  . Alcohol abuse   . Bronchitis   . Depression   . Diabetes mellitus without complication ([place])   . GERD (gastroesophageal reflux disease)   . Gout   . Hypertension   . Morbid obesity ([place])   . Suicide ideation     Patient Active Problem List   Diagnosis Date Noted  . Strep throat 01/20/2016  . Factitious disorder 01/13/2016  . Unspecified depressive disorder 01/13/2016  . Alcohol use disorder, severe, dependence ([place]) 01/13/2016  . 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 HTN (hypertension) 01/05/2016  . Gout 01/05/2016  . GERD (gastroesophageal reflux disease) 01/05/2016  . Diabetes ([place]) 01/05/2016  . Tobacco use disorder 01/05/2016  . Obesity 10/27/2015    Past Surgical History:  Procedure Laterality Date  . none      Prior to Admission medications   Medication Sig Start Date End Date Taking? Authorizing Provider  allopurinol (ZYLOPRIM) 100 MG tablet Take 100 mg by mouth daily. 09/15/15   Historical Provider, MD  aspirin EC 81 MG tablet Take 81 mg by mouth daily.    Historical Provider, MD  citalopram (CELEXA) 20 MG tablet Take 1 tablet (20 mg total) by mouth at bedtime. 01/13/16   Hildred Priest, MD  colchicine 0.6 MG tablet Take 0.6 mg by mouth daily as needed. 09/15/15   Historical Provider, MD  famotidine (PEPCID) 40 MG tablet Take 40 mg by mouth at bedtime.    Historical Provider, MD  losartan (COZAAR) 100 MG tablet TAKE 1 TABLET BY MOUTH ONCE  DAILY 01/22/16   Arlis Porta., MD  meloxicam (MOBIC) 7.5 MG tablet Take 7.5 mg by mouth daily.    Historical Provider, MD  metFORMIN (GLUCOPHAGE) 500 MG tablet TAKE 1 TABLET BY MOUTH ONCE DAILY AFTER A MEAL. 12/22/15   Arlis Porta., MD  metoprolol succinate (TOPROL-XL) 50 MG 24 hr tablet Take 50 mg by mouth daily. Take with or immediately following a meal.    Historical Provider, MD  penicillin v potassium (VEETID) 500 MG tablet Take 1 tablet (500 mg total) by mouth 4 (four) times daily. 01/20/16   Arlis Porta., MD  Respiratory Therapy Supplies DEVI 1 each by Does not apply route as needed. Please dispense as nebulizer tubing supplies. 01/20/16   Arlis Porta., MD  traZODone (DESYREL) 100 MG tablet Take 1 tablet (100 mg total) by mouth at bedtime as needed for sleep. 01/05/16   Clovis Fredrickson, MD  VENTOLIN HFA 108 (90 Base) MCG/ACT inhaler INHALE 2 PUFFS BY MOUTH EVERY 4 TO 6 HOURS AS NEEDED. 11/18/15   Arlis Porta., MD    Allergies Bee venom and Bactrim [sulfamethoxazole-trimethoprim]  Family History  Problem Relation Age of Onset  . Hypertension    . Diabetes Mellitus II      Social History Social History  Substance Use Topics  . Smoking status: Current Every Day Smoker    Packs/day: 0.50    Types:  Cigarettes  . Smokeless tobacco: Never Used  . Alcohol use 0.0 oz/week     Comment: occasional    Review of Systems Constitutional: No fever/chills Eyes: No visual changes. ENT: No sore throat. No stiff neck no neck pain Cardiovascular: Denies chest pain. Respiratory: Denies shortness of breath. Gastrointestinal:   no vomiting.  No diarrhea.  No constipation. Genitourinary: Negative for dysuria. Musculoskeletal: Negative lower extremity swelling Skin: Negative for rash. Neurological: Negative for severe headaches, focal weakness or numbness. 10-point ROS otherwise negative.  ____________________________________________   PHYSICAL  EXAM:  VITAL SIGNS: ED Triage Vitals  Enc Vitals Group     BP 03/31/16 1841 (!) 142/78     Pulse Rate 03/31/16 1841 100     Resp 03/31/16 1841 16     Temp 03/31/16 1841 97.4 F (36.3 C)     Temp Source 03/31/16 1841 Oral     SpO2 03/31/16 1841 99 %     Weight 03/31/16 1842 (!) 315 lb (142.9 kg)     Height 03/31/16 1842 6\' 1"  (1.854 m)     Head Circumference --      Peak Flow --      Pain Score --      Pain Loc --      Pain Edu? --      Excl. in [place]? --     Constitutional: Alert and oriented. Well appearing and in no acute distress. Eyes: Conjunctivae are normal. PERRL. EOMI. Head: Atraumatic. Nose: No congestion/rhinnorhea. Mouth/Throat: Mucous membranes are moist.  Oropharynx non-erythematous. Neck: No stridor.   Nontender with no meningismus Cardiovascular: Normal rate, regular rhythm. Grossly normal heart sounds.  Good peripheral circulation. Respiratory: Normal respiratory effort.  No retractions. Lungs CTAB. Abdominal: Soft and nontender. No distention. No guarding no rebound Back:  There is no focal tenderness or step off.  there is no midline tenderness there are no lesions noted. there is no CVA tenderness Musculoskeletal: No lower extremity tenderness, no upper extremity tenderness. No joint effusions, no DVT signs strong distal pulses no edema Neurologic:  Normal speech and language. No gross focal neurologic deficits are appreciated.  Skin:  Skin is warm, dry and intact. No rash noted. Psychiatric: Mood and affect are normal. Speech and behavior are normal.  ____________________________________________   LABS (all labs ordered are listed, but only abnormal results are displayed)  Labs Reviewed - No data to display ____________________________________________  EKG  I personally interpreted any EKGs ordered by me or triage  ____________________________________________  RADIOLOGY  I reviewed any imaging ordered by me or triage that were performed during my  shift and, if possible, patient and/or family made aware of any abnormal findings. ____________________________________________   PROCEDURES  Procedure(s) performed: None  Procedures  Critical Care performed: None  ____________________________________________   INITIAL IMPRESSION / ASSESSMENT AND PLAN / ED COURSE  Pertinent labs & imaging results that were available during my care of the patient were reviewed by me and considered in my medical decision making (see chart for details).  Patient is here because he has been drinking and would like to stop. He is not significantly intoxicated at this time, no evidence of alcohol withdrawal either. Patient will call home for a ride. He's been evaluated by TTS here, he is not a candidate for emergent inpatient treatment and we do not even offer that. Patient has no thoughts of hurting himself or anyone else and has no evidence of acute Medical Condition at This Time. Return Precautions and Follow-Up Given  and Understood.  Clinical Course    ____________________________________________   FINAL CLINICAL IMPRESSION(S) / ED DIAGNOSES  Final diagnoses:  None      This chart was dictated using voice recognition software.  Despite best efforts to proofread,  errors can occur which can change meaning.      Schuyler Amor, MD 03/31/16 2226

## 2016-03-31 NOTE — ED Triage Notes (Signed)
States he is seeking detox from ETOH (beer), states he has been going to [place] for 3 months and then tonight at his friend birthday he drank, states he has also been taking trazodone and hydroxine, which are prescribed to him, denies SI or HI

## 2016-03-31 NOTE — ED Notes (Addendum)
MD to evaluate pt. Pt asking for help with his ETOH abuse. ETOH today. Denies SI or HI. No other complaints at this time.

## 2016-03-31 NOTE — ED Notes (Signed)
Pt given [place] sandwich tray. Tolerated well. Has no complaints at this time.

## 2016-04-01 LAB — GLUCOSE, CAPILLARY: Glucose-Capillary: 76 mg/dL (ref 65–99)

## 2016-04-14 ENCOUNTER — Encounter: Payer: Self-pay | Admitting: Emergency Medicine

## 2016-04-14 ENCOUNTER — Emergency Department
Admission: EM | Admit: 2016-04-14 | Discharge: 2016-04-15 | Disposition: A | Discharge status: Home / Self Care | Payer: Medicare Other | Attending: Emergency Medicine | Admitting: Emergency Medicine

## 2016-04-14 DIAGNOSIS — I10 Essential (primary) hypertension: Secondary | ICD-10-CM | POA: Insufficient documentation

## 2016-04-14 DIAGNOSIS — F17210 Nicotine dependence, cigarettes, uncomplicated: Secondary | ICD-10-CM | POA: Diagnosis not present

## 2016-04-14 DIAGNOSIS — E119 Type 2 diabetes mellitus without complications: Secondary | ICD-10-CM | POA: Insufficient documentation

## 2016-04-14 DIAGNOSIS — Z7984 Long term (current) use of oral hypoglycemic drugs: Secondary | ICD-10-CM | POA: Insufficient documentation

## 2016-04-14 DIAGNOSIS — F1010 Alcohol abuse, uncomplicated: Secondary | ICD-10-CM | POA: Diagnosis present

## 2016-04-14 DIAGNOSIS — Z79899 Other long term (current) drug therapy: Secondary | ICD-10-CM | POA: Insufficient documentation

## 2016-04-14 DIAGNOSIS — Z7982 Long term (current) use of aspirin: Secondary | ICD-10-CM | POA: Diagnosis not present

## 2016-04-14 DIAGNOSIS — Y900 Blood alcohol level of less than 20 mg/100 ml: Secondary | ICD-10-CM | POA: Diagnosis not present

## 2016-04-14 LAB — COMPREHENSIVE METABOLIC PANEL
ALBUMIN: 4.3 g/dL (ref 3.5–5.0)
ALT: 48 U/L (ref 17–63)
ANION GAP: 7 (ref 5–15)
AST: 33 U/L (ref 15–41)
Alkaline Phosphatase: 61 U/L (ref 38–126)
BILIRUBIN TOTAL: 0.7 mg/dL (ref 0.3–1.2)
BUN: 9 mg/dL (ref 6–20)
CO2: 26 mmol/L (ref 22–32)
Calcium: 9 mg/dL (ref 8.9–10.3)
Chloride: 108 mmol/L (ref 101–111)
Creatinine, Ser: 1.42 mg/dL — ABNORMAL HIGH (ref 0.61–1.24)
GFR calc Af Amer: >60 mL/min (ref >60)
GFR calc non Af Amer: 60 mL/min — ABNORMAL LOW (ref >60)
GLUCOSE: 104 mg/dL — AB (ref 65–99)
POTASSIUM: 4 mmol/L (ref 3.5–5.1)
SODIUM: 141 mmol/L (ref 135–145)
Total Protein: 7.8 g/dL (ref 6.5–8.1)

## 2016-04-14 LAB — URINE DRUG SCREEN, QUALITATIVE (ARMC ONLY)
Amphetamines, Ur Screen: NOT DETECTED
BARBITURATES, UR SCREEN: NOT DETECTED
Benzodiazepine, Ur Scrn: NOT DETECTED
CANNABINOID 50 NG, UR ~~LOC~~: NOT DETECTED
COCAINE METABOLITE, UR ~~LOC~~: POSITIVE — AB
MDMA (ECSTASY) UR SCREEN: NOT DETECTED
Methadone Scn, Ur: NOT DETECTED
OPIATE, UR SCREEN: NOT DETECTED
PHENCYCLIDINE (PCP) UR S: NOT DETECTED
Tricyclic, Ur Screen: NOT DETECTED

## 2016-04-14 LAB — CBC
HEMATOCRIT: 43.9 % (ref 40.0–52.0)
HEMOGLOBIN: 14.5 g/dL (ref 13.0–18.0)
MCH: 30.3 pg (ref 26.0–34.0)
MCHC: 33 g/dL (ref 32.0–36.0)
MCV: 91.9 fL (ref 80.0–100.0)
Platelets: 227 10*3/uL (ref 150–440)
RBC: 4.78 MIL/uL (ref 4.40–5.90)
RDW: 14.7 % — ABNORMAL HIGH (ref 11.5–14.5)
WBC: 13.1 10*3/uL — ABNORMAL HIGH (ref 3.8–10.6)

## 2016-04-14 LAB — ETHANOL: Alcohol, Ethyl (B): <5 mg/dL (ref <5)

## 2016-04-14 NOTE — ED Notes (Signed)
Gave pt. Meal tray and drink.

## 2016-04-14 NOTE — ED Notes (Signed)
Pt. Given water and warm blankets upon request.

## 2016-04-14 NOTE — ED Notes (Signed)
Changed pt. Into burgandy scrubs in bathroom.  Pt. Put belongings in one bag, pair of shoes, 1 shirt, 1 undershirt, 1 pair of shorts, 1 pair of underwear, altoids box and 1 cell phone.

## 2016-04-14 NOTE — ED Triage Notes (Signed)
Pt comes into the ED via POV wanting to see the psychiatrist for behavioral health eval.  Patient states he is feeling hopeless and struggling with alcoholism on top of his medications.  Patient has been seen at [facility] recently and fell back into his old ways.  Patient denies SI or HI and is here voluntarily.  Patient states he has been around the wrong people that have gotten him back to doing things wrong. Patient calm and cooperative at this time.

## 2016-04-15 ENCOUNTER — Encounter: Payer: Self-pay | Admitting: Emergency Medicine

## 2016-04-15 DIAGNOSIS — F1010 Alcohol abuse, uncomplicated: Secondary | ICD-10-CM | POA: Diagnosis not present

## 2016-04-15 MED ORDER — METOPROLOL SUCCINATE ER 50 MG PO TB24
50.0000 mg | ORAL_TABLET | Freq: Every day | ORAL | Status: DC
Start: 2016-04-15 — End: 2016-04-15

## 2016-04-15 MED ORDER — FAMOTIDINE 20 MG PO TABS: 40.0000 mg | ORAL_TABLET | Freq: Every day | ORAL | Status: DC

## 2016-04-15 MED ORDER — ALBUTEROL SULFATE HFA 108 (90 BASE) MCG/ACT IN AERS
2.0000 | INHALATION_SPRAY | Freq: Four times a day (QID) | RESPIRATORY_TRACT | Status: DC
  Filled 2016-04-15: qty 6.7

## 2016-04-15 MED ORDER — MELOXICAM 7.5 MG PO TABS
7.5000 mg | ORAL_TABLET | Freq: Every day | ORAL | Status: DC
  Filled 2016-04-15: qty 1

## 2016-04-15 MED ORDER — LOSARTAN POTASSIUM 50 MG PO TABS: 100.0000 mg | ORAL_TABLET | Freq: Every day | ORAL | Status: DC

## 2016-04-15 MED ORDER — ASPIRIN EC 81 MG PO TBEC: 81.0000 mg | DELAYED_RELEASE_TABLET | Freq: Every day | ORAL | Status: DC

## 2016-04-15 MED ORDER — METFORMIN HCL 500 MG PO TABS: 500.0000 mg | ORAL_TABLET | Freq: Every day | ORAL | Status: DC

## 2016-04-15 NOTE — BH Assessment (Addendum)
Tele Assessment Note   Alan Eaton is an 41 y.o. male. Information gathering limited by patient's reticence. Pt is requesting alcohol detox services. Pt reports history of alcohol use (last use: 1.23.18, amount: "a lot",  frequency "a lot", duration: "a lot"). Pt denies cocaine use however, UDS is positive for cocaine. Pt denies withdrawal symptoms. Pt denies SI, HI, and hallucinations. Pt reports h/o suicide attempt "way in the past but not lately". Pt is followed by [facility].  Diagnosis: Alcohol Use Depression  Past Medical History:  Past Medical History:  Diagnosis Date  . Alcohol abuse   . Bronchitis   . Depression   . Diabetes mellitus without complication ([place])   . GERD (gastroesophageal reflux disease)   . Gout   . Hypertension   . Morbid obesity ([place])   . Suicide ideation     Past Surgical History:  Procedure Laterality Date  . none      Family History:  Family History  Problem Relation Age of Onset  . Hypertension    . Diabetes Mellitus II      Social History:  reports that he has been smoking Cigarettes.  He has been smoking about 0.50 packs per day. He has never used smokeless tobacco. He reports that he drinks alcohol. He reports that he uses drugs, including Marijuana and Cocaine, about 1 time per week.  Additional Social History:  Alcohol / Drug Use Pain Medications: Pt denies abuse. Prescriptions: Pt denies abuse. Over the Counter: Pt denies abuse. History of alcohol / drug use?: Yes Longest period of sobriety (when/how long): "since the last time I was here" (13 days- per chart pt's last encouter was 1.1018) Negative Consequences of Use: Personal relationships, Work / Youth worker, Museum/gallery curator, Scientist, research (physical sciences) (Per Chart) Withdrawal Symptoms:  (Pt denies) Substance #1 Name of Substance 1: Alcohol 1 - Age of First Use: Not Reported 1 - Amount (size/oz): "a lot" 1 - Frequency: "a lot" 1 - Duration: Ongoing 1 - Last Use / Amount: 1.23.18/ "a lot"  CIWA: CIWA-Ar BP:  137/81 Pulse Rate: 75 COWS:    PATIENT STRENGTHS: (choose at least two) Average or above average intelligence Supportive family/friends  Allergies:  Allergies  Allergen Reactions  . Bee Venom Swelling  . Bactrim [Sulfamethoxazole-Trimethoprim] Swelling and Rash    Home Medications:  (Not in a hospital admission)  OB/GYN Status:  No LMP for male patient.  General Assessment Data Location of Assessment: [facility] ED TTS Assessment: In system Is this a Tele or Face-to-Face Assessment?: Face-to-Face Is this an Initial Assessment or a Re-assessment for this encounter?: Initial Assessment Marital status: Divorced Is patient pregnant?: No Pregnancy Status: No Living Arrangements: Other (Comment) (Homeless) Can pt return to current living arrangement?: Yes Admission Status: Voluntary Is patient capable of signing voluntary admission?: Yes Referral Source: Self/Family/Friend Insurance type: [facility] Medicare     Crisis Care Plan Living Arrangements: Other (Comment) (Homeless) Name of Psychiatrist: None Name of Therapist: [facility]  Education Status Is patient currently in school?: No Highest grade of school patient has completed: 12th  Risk to self with the past 6 months Suicidal Ideation: No Has patient been a risk to self within the past 6 months prior to admission? : No Suicidal Intent: No Has patient had any suicidal intent within the past 6 months prior to admission? : No Is patient at risk for suicide?: No Suicidal Plan?: No Has patient had any suicidal plan within the past 6 months prior to admission? : No Access to Means: No Previous  Attempts/Gestures: Yes How many times?:  (Not Reported) Triggers for Past Attempts: Unknown Intentional Self Injurious Behavior: None Family Suicide History: No Recent stressful life event(s):  (None Reported) Persecutory voices/beliefs?: No Depression: Yes Depression Symptoms: Tearfulness, Guilt, Isolating, Fatigue Substance abuse history  and/or treatment for substance abuse?: Yes Suicide prevention information given to non-admitted patients: Yes  Risk to Others within the past 6 months Homicidal Ideation: No Does patient have any lifetime risk of violence toward others beyond the six months prior to admission? : No Thoughts of Harm to Others: No Current Homicidal Intent: No Current Homicidal Plan: No Access to Homicidal Means: No History of harm to others?: No Assessment of Violence: None Noted Does patient have access to weapons?: No Criminal Charges Pending?: No Does patient have a court date: No Is patient on probation?: No  Psychosis Hallucinations: None noted Delusions: None noted  Mental Status Report Appearance/Hygiene: In scrubs Eye Contact: Poor Motor Activity: Unremarkable Speech: Logical/coherent Level of Consciousness: Quiet/awake Mood: Irritable Affect: Constricted Anxiety Level: None Thought Processes: Coherent, Relevant Judgement: Partial Orientation: Person, Place, Time, Situation Obsessive Compulsive Thoughts/Behaviors: None  Cognitive Functioning Concentration: Decreased Memory: Recent Intact, Remote Intact IQ: Average Insight: Poor Impulse Control: Fair Appetite: Poor Weight Loss:  (pt reports unknown amt. of weight loss) Weight Gain: 0 Sleep: Decreased Total Hours of Sleep: 5 Vegetative Symptoms: Staying in bed  ADLScreening [facility] Assessment Services) Patient's cognitive ability adequate to safely complete daily activities?: Yes Patient able to express need for assistance with ADLs?: Yes Independently performs ADLs?: Yes (appropriate for developmental age)  Prior Inpatient Therapy Prior Inpatient Therapy: Yes Prior Therapy Dates: Multiple, 12/2015 x2 Prior Therapy Facilty/Provider(s): [facility] Reason for Treatment: Depression, SI  Prior Outpatient Therapy Prior Outpatient Therapy: Yes Prior Therapy Dates: Ongoing Prior Therapy Facilty/Provider(s): [facility] Reason for Treatment:  Substance Abuse Does patient have an ACCT team?: No Does patient have Intensive In-House Services?  : No Does patient have [facility] services? : No Does patient have P4CC services?: No  ADL Screening (condition at time of admission) Patient's cognitive ability adequate to safely complete daily activities?: Yes Is the patient deaf or have difficulty hearing?: No Does the patient have difficulty seeing, even when wearing glasses/contacts?: No Does the patient have difficulty concentrating, remembering, or making decisions?: No Patient able to express need for assistance with ADLs?: Yes Does the patient have difficulty dressing or bathing?: No Independently performs ADLs?: Yes (appropriate for developmental age) Does the patient have difficulty walking or climbing stairs?: No Weakness of Legs: None Weakness of Arms/Hands: None  Home Assistive Devices/Equipment Home Assistive Devices/Equipment: None  Therapy Consults (therapy consults require a physician order) PT Evaluation Needed: No OT Evalulation Needed: No SLP Evaluation Needed: No Abuse/Neglect Assessment (Assessment to be complete while patient is alone) Physical Abuse:  ("I don't want to answer that right now") Verbal Abuse:  ("I don't want to answer that right now") Sexual Abuse:  ("I don't want to answer that right now") Exploitation of patient/patient's resources:  ("I don't want to answer that right now") Self-Neglect: Denies Values / Beliefs Cultural Requests During Hospitalization: None Spiritual Requests During Hospitalization: None Consults Spiritual Care Consult Needed: No Social Work Consult Needed: No Regulatory affairs officer (For Healthcare) Does Patient Have a Medical Advance Directive?: No Would patient like information on creating a medical advance directive?: No - Patient declined    Additional Information 1:1 In Past 12 Months?: No CIRT Risk: No Elopement Risk: No Does patient have medical clearance?:  Yes  Disposition:  Disposition Initial Assessment Completed for this Encounter: Yes Disposition of Patient: Referred to Patient referred to: [facility]  Myrian Botello J [place] 04/15/2016 7:08 AM

## 2016-04-15 NOTE — ED Notes (Signed)
Patient given information on [facility] in [place] by TTS.

## 2016-04-15 NOTE — ED Provider Notes (Signed)
[facility] Emergency Department Provider Note    First MD Initiated Contact with Patient 04/14/16 2325     (approximate)  I have reviewed the triage vital signs and the nursing notes.   HISTORY  Chief Complaint Medical Clearance    HPI Alan Eaton is a 41 y.o. male history of alcohol abuse presents to the emergency department with history of drinking heavily yesterday. Patient states that he has not "drank in a while" however that he's drinking heavily yesterday and is requesting detox at this time. Review the patient's chart revealed that he was seen on 03/31/2016 with a request for alcohol detox at that time. She denies any suicidal or homicidal ideation   Past Medical History:  Diagnosis Date  . Alcohol abuse   . Bronchitis   . Depression   . Diabetes mellitus without complication ([place])   . GERD (gastroesophageal reflux disease)   . Gout   . Hypertension   . Morbid obesity ([place])   . Suicide ideation     Patient Active Problem List   Diagnosis Date Noted  . Strep throat 01/20/2016  . Factitious disorder 01/13/2016  . Unspecified depressive disorder 01/13/2016  . Alcohol use disorder, severe, dependence ([place]) 01/13/2016  . 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 HTN (hypertension) 01/05/2016  . Gout 01/05/2016  . GERD (gastroesophageal reflux disease) 01/05/2016  . Diabetes ([place]) 01/05/2016  . Tobacco use disorder 01/05/2016  . Obesity 10/27/2015    Past Surgical History:  Procedure Laterality Date  . none      Prior to Admission medications   Medication Sig Start Date End Date Taking? Authorizing Provider  allopurinol (ZYLOPRIM) 100 MG tablet Take 100 mg by mouth daily. 09/15/15   Historical Provider, MD  aspirin EC 81 MG tablet Take 81 mg by mouth daily.    Historical Provider, MD  citalopram (CELEXA) 20 MG tablet Take 1 tablet (20 mg total) by mouth at bedtime. 01/13/16   Hildred Priest, MD  colchicine 0.6 MG tablet  Take 0.6 mg by mouth daily as needed. 09/15/15   Historical Provider, MD  famotidine (PEPCID) 40 MG tablet Take 40 mg by mouth at bedtime.    Historical Provider, MD  losartan (COZAAR) 100 MG tablet TAKE 1 TABLET BY MOUTH ONCE DAILY 01/22/16   Arlis Porta., MD  meloxicam (MOBIC) 7.5 MG tablet Take 7.5 mg by mouth daily.    Historical Provider, MD  metFORMIN (GLUCOPHAGE) 500 MG tablet TAKE 1 TABLET BY MOUTH ONCE DAILY AFTER A MEAL. 12/22/15   Arlis Porta., MD  metoprolol succinate (TOPROL-XL) 50 MG 24 hr tablet Take 50 mg by mouth daily. Take with or immediately following a meal.    Historical Provider, MD  penicillin v potassium (VEETID) 500 MG tablet Take 1 tablet (500 mg total) by mouth 4 (four) times daily. 01/20/16   Arlis Porta., MD  Respiratory Therapy Supplies DEVI 1 each by Does not apply route as needed. Please dispense as nebulizer tubing supplies. 01/20/16   Arlis Porta., MD  traZODone (DESYREL) 100 MG tablet Take 1 tablet (100 mg total) by mouth at bedtime as needed for sleep. 01/05/16   Clovis Fredrickson, MD  VENTOLIN HFA 108 (90 Base) MCG/ACT inhaler INHALE 2 PUFFS BY MOUTH EVERY 4 TO 6 HOURS AS NEEDED. 11/18/15   Arlis Porta., MD    Allergies Bee venom and Bactrim [sulfamethoxazole-trimethoprim]  Family History  Problem Relation Age of Onset  .  Hypertension    . Diabetes Mellitus II      Social History Social History  Substance Use Topics  . Smoking status: Current Every Day Smoker    Packs/day: 0.50    Types: Cigarettes  . Smokeless tobacco: Never Used  . Alcohol use 0.0 oz/week     Comment: occasional    Review of Systems Constitutional: No fever/chills Eyes: No visual changes. ENT: No sore throat. Cardiovascular: Denies chest pain. Respiratory: Denies shortness of breath. Gastrointestinal: No abdominal pain.  No nausea, no vomiting.  No diarrhea.  No constipation. Genitourinary: Negative for dysuria. Musculoskeletal: Negative  for back pain. Skin: Negative for rash. Neurological: Negative for headaches, focal weakness or numbness. Psychiatric:Positive for alcohol abuse  10-point ROS otherwise negative.  ____________________________________________   PHYSICAL EXAM:  VITAL SIGNS: ED Triage Vitals  Enc Vitals Group     BP 04/14/16 2116 (!) 144/92     Pulse Rate 04/14/16 2116 84     Resp 04/14/16 2116 18     Temp 04/14/16 2116 98 F (36.7 C)     Temp Source 04/14/16 2116 Oral     SpO2 04/14/16 2116 95 %     Weight 04/14/16 2117 (!) 315 lb (142.9 kg)     Height 04/14/16 2117 6\' 1"  (1.854 m)     Head Circumference --      Peak Flow --      Pain Score --      Pain Loc --      Pain Edu? --      Excl. in [place]? --     Constitutional: Alert and oriented. Well appearing and in no acute distress. Eyes: Conjunctivae are normal. PERRL. EOMI. Head: Atraumatic. Mouth/Throat: Mucous membranes are moist.  Oropharynx non-erythematous. Neck: No stridor.   Cardiovascular: Normal rate, regular rhythm. Good peripheral circulation. Grossly normal heart sounds. Respiratory: Normal respiratory effort.  No retractions. Lungs CTAB. Gastrointestinal: Soft and nontender. No distention.  Musculoskeletal: No lower extremity tenderness nor edema. No gross deformities of extremities. Neurologic:  Normal speech and language. No gross focal neurologic deficits are appreciated.  Skin:  Skin is warm, dry and intact. No rash noted. Psychiatric: Mood and affect are normal. Speech and behavior are normal.  ____________________________________________   LABS (all labs ordered are listed, but only abnormal results are displayed)  Labs Reviewed  COMPREHENSIVE METABOLIC PANEL - Abnormal; Notable for the following:       Result Value   Glucose, Bld 104 (*)    Creatinine, Ser 1.42 (*)    GFR calc non Af Amer 60 (*)    All other components within normal limits  CBC - Abnormal; Notable for the following:    WBC 13.1 (*)    RDW  14.7 (*)    All other components within normal limits  URINE DRUG SCREEN, QUALITATIVE ([facility] ONLY) - Abnormal; Notable for the following:    Cocaine Metabolite,Ur  POSITIVE (*)    All other components within normal limits  ETHANOL     Procedures   INITIAL IMPRESSION / ASSESSMENT AND PLAN / ED COURSE  Pertinent labs & imaging results that were available during my care of the patient were reviewed by me and considered in my medical decision making (see chart for details).        ____________________________________________  FINAL CLINICAL IMPRESSION(S) / ED DIAGNOSES  Final diagnoses:  Alcohol abuse  Cocaine use   MEDICATIONS GIVEN DURING THIS VISIT:  Medications - No data to display  NEW OUTPATIENT MEDICATIONS STARTED DURING THIS VISIT:  New Prescriptions   No medications on file    Modified Medications   No medications on file    Discontinued Medications   No medications on file     Note:  This document was prepared using Dragon voice recognition software and may include unintentional dictation errors.    Gregor Hams, MD 04/15/16 0430

## 2016-04-19 ENCOUNTER — Encounter: Payer: Self-pay | Admitting: Family Medicine

## 2016-04-19 ENCOUNTER — Ambulatory Visit (INDEPENDENT_AMBULATORY_CARE_PROVIDER_SITE_OTHER): Payer: Medicare Other | Admitting: Family Medicine

## 2016-04-19 VITALS — BP 165/100 | HR 104 | Temp 98.4°F | Resp 16 | Ht 73.0 in | Wt 313.0 lb

## 2016-04-19 DIAGNOSIS — Z0000 Encounter for general adult medical examination without abnormal findings: Secondary | ICD-10-CM | POA: Diagnosis not present

## 2016-04-19 DIAGNOSIS — E118 Type 2 diabetes mellitus with unspecified complications: Secondary | ICD-10-CM | POA: Diagnosis not present

## 2016-04-19 DIAGNOSIS — Z23 Encounter for immunization: Secondary | ICD-10-CM

## 2016-04-19 DIAGNOSIS — R000 Tachycardia, unspecified: Secondary | ICD-10-CM | POA: Diagnosis not present

## 2016-04-19 DIAGNOSIS — F1020 Alcohol dependence, uncomplicated: Secondary | ICD-10-CM | POA: Diagnosis not present

## 2016-04-19 DIAGNOSIS — I10 Essential (primary) hypertension: Secondary | ICD-10-CM

## 2016-04-19 MED ORDER — METOPROLOL SUCCINATE ER 100 MG PO TB24: 100.0000 mg | ORAL_TABLET | Freq: Every day | ORAL | 12 refills | Status: DC

## 2016-04-19 NOTE — Progress Notes (Signed)
Name: Alan Eaton   MRN: [id number]    DOB: 1975/08/06   Date:04/19/2016       Progress Note  Subjective  Chief Complaint  Chief Complaint  Patient presents with  . Annual Exam  . Diabetes  . Hypertension  . Obesity  . Alcohol Intoxication    HPI Here for annual healthcare maintenance.  Hx of DM, HBP Obesity, alcohol abuse.  He is off alcohol x about 1 week.  HE has lost a good amount of weight tjrough diet.  No problem-specific Assessment & Plan notes found for this encounter.   Past Medical History:  Diagnosis Date  . Alcohol abuse   . Bronchitis   . Depression   . Diabetes mellitus without complication ([place])   . GERD (gastroesophageal reflux disease)   . Gout   . Hypertension   . Morbid obesity ([place])   . Suicide ideation     Past Surgical History:  Procedure Laterality Date  . none      Family History  Problem Relation Age of Onset  . Hypertension    . Diabetes Mellitus II      Social History   Social History  . Marital status: Divorced    Spouse name: N/A  . Number of children: N/A  . Years of education: N/A   Occupational History  . Not on file.   Social History Main Topics  . Smoking status: Current Every Day Smoker    Packs/day: 0.50    Types: Cigarettes  . Smokeless tobacco: Never Used  . Alcohol use 0.0 oz/week     Comment: occasional  . Drug use: Yes    Frequency: 1.0 time per week    Types: Marijuana, Cocaine     Comment: last use of crack was 04/12/16  . Sexual activity: Not on file   Other Topics Concern  . Not on file   Social History Narrative  . No narrative on file     Current Outpatient Prescriptions:  .  allopurinol (ZYLOPRIM) 100 MG tablet, Take 100 mg by mouth daily., Disp: , Rfl:  .  allopurinol (ZYLOPRIM) 300 MG tablet, Take 300 mg by mouth daily., Disp: , Rfl:  .  aspirin EC 81 MG tablet, Take 81 mg by mouth daily., Disp: , Rfl:  .  B Complex-C (B-COMPLEX WITH VITAMIN C) tablet, Take 1 tablet by mouth daily.,  Disp: , Rfl:  .  colchicine 0.6 MG tablet, Take 0.6 mg by mouth daily as needed., Disp: , Rfl:  .  famotidine (PEPCID) 40 MG tablet, Take 40 mg by mouth at bedtime., Disp: , Rfl:  .  losartan (COZAAR) 100 MG tablet, TAKE 1 TABLET BY MOUTH ONCE DAILY, Disp: 90 tablet, Rfl: 3 .  meloxicam (MOBIC) 7.5 MG tablet, Take 7.5 mg by mouth daily., Disp: , Rfl:  .  metFORMIN (GLUCOPHAGE) 500 MG tablet, TAKE 1 TABLET BY MOUTH ONCE DAILY AFTER A MEAL., Disp: 90 tablet, Rfl: 3 .  metoprolol succinate (TOPROL-XL) 50 MG 24 hr tablet, Take 50 mg by mouth daily. Take with or immediately following a meal., Disp: , Rfl:  .  Multiple Vitamin (MULTIVITAMIN WITH MINERALS) TABS tablet, Take 1 tablet by mouth daily., Disp: , Rfl:  .  promethazine (PHENERGAN) 25 MG tablet, Take 25 mg by mouth every 6 (six) hours as needed for nausea or vomiting., Disp: , Rfl:  .  VENTOLIN HFA 108 (90 Base) MCG/ACT inhaler, INHALE 2 PUFFS BY MOUTH EVERY 4 TO 6 HOURS AS NEEDED.,  Disp: 18 g, Rfl: 12  Allergies  Allergen Reactions  . Bee Venom Swelling  . Bactrim [Sulfamethoxazole-Trimethoprim] Swelling and Rash     Review of Systems  Constitutional: Positive for weight loss (desired). Negative for chills, fever and malaise/fatigue.  HENT: Negative for congestion, hearing loss, sore throat and tinnitus.   Eyes: Negative for blurred vision and double vision.  Respiratory: Negative for cough, shortness of breath and wheezing.   Cardiovascular: Negative for chest pain, palpitations, orthopnea and leg swelling.  Gastrointestinal: Negative for abdominal pain, blood in stool and heartburn.  Genitourinary: Negative for dysuria, frequency and urgency.  Musculoskeletal: Negative for back pain, joint pain and myalgias.  Skin: Negative for rash.  Neurological: Negative for dizziness, tingling, tremors, weakness and headaches.  Psychiatric/Behavioral: Negative for depression. The patient is not nervous/anxious.       Objective  Vitals:    04/19/16 1455  BP: (!) 158/100  Pulse: (!) 123  Resp: 16  Temp: 98.4 F (36.9 C)  TempSrc: Oral  Weight: (!) 313 lb (142 kg)  Height: _0  (1.854 m)    Physical Exam  Constitutional: He is oriented to person, place, and time and well-developed, well-nourished, and in no distress. No distress.  HENT:  Head: Normocephalic and atraumatic.  Right Ear: External ear normal.  Left Ear: External ear normal.  Nose: Nose normal.  Mouth/Throat: Oropharynx is clear and moist.  Eyes: Conjunctivae and EOM are normal. Pupils are equal, round, and reactive to light. No scleral icterus.  Fundoscopic exam:      The right eye shows no arteriolar narrowing, no AV nicking, no exudate, no hemorrhage and no papilledema.       The left eye shows no arteriolar narrowing, no AV nicking, no exudate, no hemorrhage and no papilledema.  Neck: Normal range of motion. Neck supple. Carotid bruit is not present. No thyromegaly present.  Cardiovascular: Regular rhythm and normal heart sounds.  Tachycardia present.  Exam reveals no gallop and no friction rub.   No murmur heard. Pulmonary/Chest: Effort normal and breath sounds normal. No respiratory distress. He has no wheezes. He has no rales.  Abdominal: Soft. Bowel sounds are normal. He exhibits no distension, no abdominal bruit and no mass. There is no tenderness.  Abdomen morbidly obese  Musculoskeletal: He exhibits no edema.  Lymphadenopathy:    He has no cervical adenopathy.  Neurological: He is alert and oriented to person, place, and time. No cranial nerve deficit.  Skin: Skin is warm and dry.  Vitals reviewed.      Recent Results (from the past 2160 hour(s))  POCT rapid strep A     Status: Abnormal   Collection Time: 01/20/16  4:24 PM  Result Value Ref Range   Rapid Strep A Screen Positive (A) Negative  Glucose, capillary     Status: None   Collection Time: 03/31/16 11:51 PM  Result Value Ref Range   Glucose-Capillary 76 65 - 99 mg/dL   Comprehensive metabolic panel     Status: Abnormal   Collection Time: 04/14/16  9:20 PM  Result Value Ref Range   Sodium 141 135 - 145 mmol/L   Potassium 4.0 3.5 - 5.1 mmol/L   Chloride 108 101 - 111 mmol/L   CO2 26 22 - 32 mmol/L   Glucose, Bld 104 (H) 65 - 99 mg/dL   BUN 9 6 - 20 mg/dL   Creatinine, Ser 1.42 (H) 0.61 - 1.24 mg/dL   Calcium 9.0 8.9 - 10.3 mg/dL  Total Protein 7.8 6.5 - 8.1 g/dL   Albumin 4.3 3.5 - 5.0 g/dL   AST 33 15 - 41 U/L   ALT 48 17 - 63 U/L   Alkaline Phosphatase 61 38 - 126 U/L   Total Bilirubin 0.7 0.3 - 1.2 mg/dL   GFR calc non Af Amer 60 (L) >60 mL/min   GFR calc Af Amer >60 >60 mL/min    Comment: (NOTE) The eGFR has been calculated using the CKD EPI equation. This calculation has not been validated in all clinical situations. eGFR's persistently <60 mL/min signify possible Chronic Kidney Disease.    Anion gap 7 5 - 15  Ethanol     Status: None   Collection Time: 04/14/16  9:20 PM  Result Value Ref Range   Alcohol, Ethyl (B) <5 <5 mg/dL    Comment:        LOWEST DETECTABLE LIMIT FOR SERUM ALCOHOL IS 5 mg/dL FOR MEDICAL PURPOSES ONLY   cbc     Status: Abnormal   Collection Time: 04/14/16  9:20 PM  Result Value Ref Range   WBC 13.1 (H) 3.8 - 10.6 K/uL   RBC 4.78 4.40 - 5.90 MIL/uL   Hemoglobin 14.5 13.0 - 18.0 g/dL   HCT 43.9 40.0 - 52.0 %   MCV 91.9 80.0 - 100.0 fL   MCH 30.3 26.0 - 34.0 pg   MCHC 33.0 32.0 - 36.0 g/dL   RDW 14.7 (H) 11.5 - 14.5 %   Platelets 227 150 - 440 K/uL  Urine Drug Screen, Qualitative     Status: Abnormal   Collection Time: 04/14/16  9:20 PM  Result Value Ref Range   Tricyclic, Ur Screen NONE DETECTED NONE DETECTED   Amphetamines, Ur Screen NONE DETECTED NONE DETECTED   MDMA (Ecstasy)Ur Screen NONE DETECTED NONE DETECTED   Cocaine Metabolite,Ur [place] POSITIVE (A) NONE DETECTED   Opiate, Ur Screen NONE DETECTED NONE DETECTED   Phencyclidine (PCP) Ur S NONE DETECTED NONE DETECTED   Cannabinoid 50 Ng, Ur [place] NONE  DETECTED NONE DETECTED   Barbiturates, Ur Screen NONE DETECTED NONE DETECTED   Benzodiazepine, Ur Scrn NONE DETECTED NONE DETECTED   Methadone Scn, Ur NONE DETECTED NONE DETECTED    Comment: (NOTE) 638  Tricyclics, urine               Cutoff 1000 ng/mL 200  Amphetamines, urine             Cutoff 1000 ng/mL 300  MDMA (Ecstasy), urine           Cutoff 500 ng/mL 400  Cocaine Metabolite, urine       Cutoff 300 ng/mL 500  Opiate, urine                   Cutoff 300 ng/mL 600  Phencyclidine (PCP), urine      Cutoff 25 ng/mL 700  Cannabinoid, urine              Cutoff 50 ng/mL 800  Barbiturates, urine             Cutoff 200 ng/mL 900  Benzodiazepine, urine           Cutoff 200 ng/mL 1000 Methadone, urine                Cutoff 300 ng/mL 1100 1200 The urine drug screen provides only a preliminary, unconfirmed 1300 analytical test result and should not be used for non-medical 1400 purposes. Clinical consideration and professional judgment  should 1500 be applied to any positive drug screen result due to possible 1600 interfering substances. A more specific alternate chemical method 1700 must be used in order to obtain a confirmed analytical result.  1800 Gas chromato graphy / mass spectrometry (GC/MS) is the preferred 1900 confirmatory method.      Assessment & Plan  Problem List Items Addressed This Visit    None      No orders of the defined types were placed in this encounter.

## 2016-04-20 ENCOUNTER — Other Ambulatory Visit: Payer: Medicare Other

## 2016-04-20 DIAGNOSIS — I10 Essential (primary) hypertension: Secondary | ICD-10-CM | POA: Diagnosis not present

## 2016-04-20 DIAGNOSIS — E118 Type 2 diabetes mellitus with unspecified complications: Secondary | ICD-10-CM | POA: Diagnosis not present

## 2016-04-21 ENCOUNTER — Other Ambulatory Visit: Payer: Self-pay | Admitting: Family Medicine

## 2016-04-21 LAB — COMPLETE METABOLIC PANEL WITH GFR
ALBUMIN: 4 g/dL (ref 3.6–5.1)
ALK PHOS: 51 U/L (ref 40–115)
ALT: 44 U/L (ref 9–46)
AST: 24 U/L (ref 10–40)
BUN: 11 mg/dL (ref 7–25)
CALCIUM: 9.4 mg/dL (ref 8.6–10.3)
CO2: 31 mmol/L (ref 20–31)
Chloride: 104 mmol/L (ref 98–110)
Creat: 1.29 mg/dL (ref 0.60–1.35)
GFR, EST NON AFRICAN AMERICAN: 68 mL/min (ref >=60)
GFR, Est African American: 79 mL/min (ref >=60)
Glucose, Bld: 102 mg/dL — ABNORMAL HIGH (ref 65–99)
POTASSIUM: 4.4 mmol/L (ref 3.5–5.3)
Sodium: 140 mmol/L (ref 135–146)
Total Bilirubin: 0.4 mg/dL (ref 0.2–1.2)
Total Protein: 7.1 g/dL (ref 6.1–8.1)

## 2016-04-21 LAB — CBC WITH DIFFERENTIAL/PLATELET
Basophils Absolute: 0 cells/uL (ref 0–200)
Basophils Relative: 0 %
EOS PCT: 5 %
Eosinophils Absolute: 475 cells/uL (ref 15–500)
HCT: 45.5 % (ref 38.5–50.0)
HEMOGLOBIN: 15 g/dL (ref 13.2–17.1)
LYMPHS ABS: 2470 {cells}/uL (ref 850–3900)
Lymphocytes Relative: 26 %
MCH: 30.5 pg (ref 27.0–33.0)
MCHC: 33 g/dL (ref 32.0–36.0)
MCV: 92.7 fL (ref 80.0–100.0)
MPV: 10.7 fL (ref 7.5–12.5)
Monocytes Absolute: 950 cells/uL (ref 200–950)
Monocytes Relative: 10 %
NEUTROS ABS: 5605 {cells}/uL (ref 1500–7800)
Neutrophils Relative %: 59 %
Platelets: 207 10*3/uL (ref 140–400)
RBC: 4.91 MIL/uL (ref 4.20–5.80)
RDW: 14.8 % (ref 11.0–15.0)
WBC: 9.5 10*3/uL (ref 3.8–10.8)

## 2016-04-21 LAB — LIPID PANEL
CHOLESTEROL: 121 mg/dL (ref <200)
HDL: 38 mg/dL — ABNORMAL LOW (ref >40)
LDL Cholesterol: 65 mg/dL (ref <100)
TRIGLYCERIDES: 88 mg/dL (ref <150)
Total CHOL/HDL Ratio: 3.2 Ratio (ref <5.0)
VLDL: 18 mg/dL (ref <30)

## 2016-04-21 LAB — TSH: TSH: 1.52 mIU/L (ref 0.40–4.50)

## 2016-05-01 IMAGING — CT CT ANGIO CHEST
2 of 6 series · 18 of 36 positions shown · IV contrast (APPLIED)
Comparison: Chest radiograph performed 10/12/2013

CLINICAL DATA: Pleuritic chest pain.  Elevated D-dimer.

EXAM:
CT ANGIOGRAPHY CHEST WITH CONTRAST
TECHNIQUE: Multidetector CT imaging of the chest was performed using the
standard protocol during bolus administration of intravenous
contrast. Multiplanar CT image reconstructions and MIPs were
obtained to evaluate the vascular anatomy.
CONTRAST:  100 mL of Isovue 370 IV contrast

[Series 5: pe 1.0 thins · axial · 0.74mm/px · z∈[+898,+1126]mm · 17 of 258 slices shown]
[im 15/258  lung]
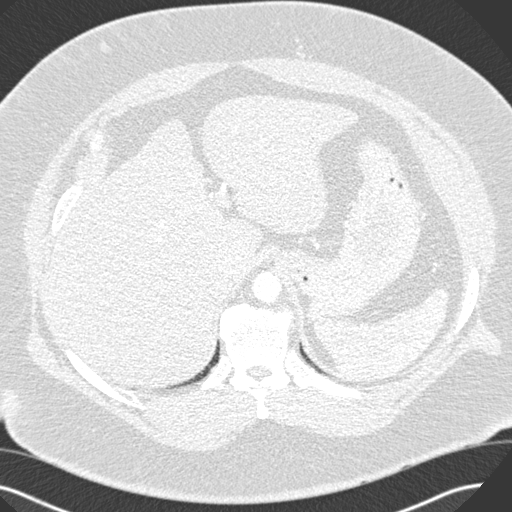
[im 29/258  mediastinal]
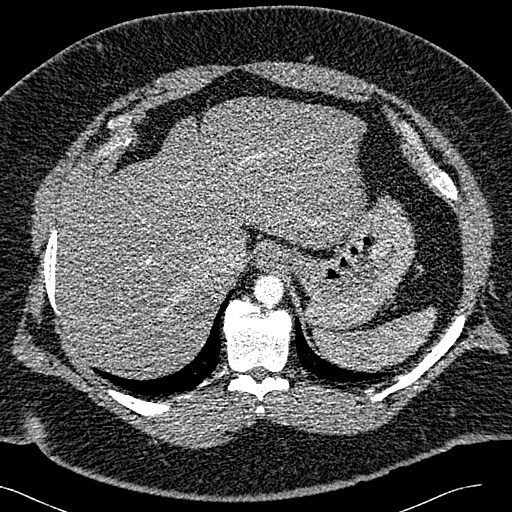
[im 43/258  lung]
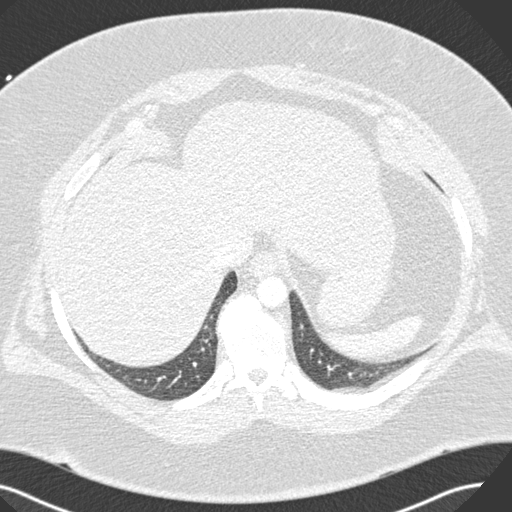
[im 58/258  mediastinal]
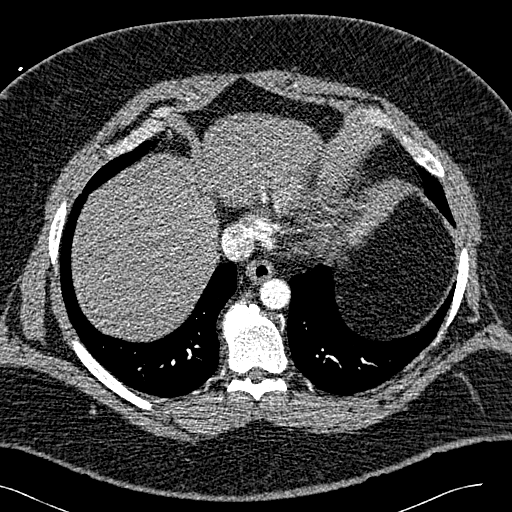
[im 72/258  lung]
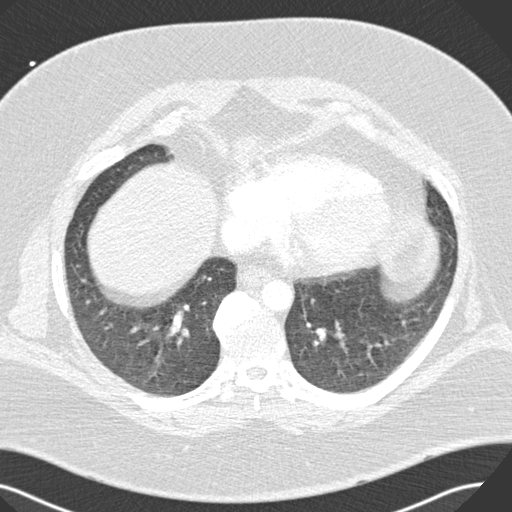
[im 86/258  mediastinal]
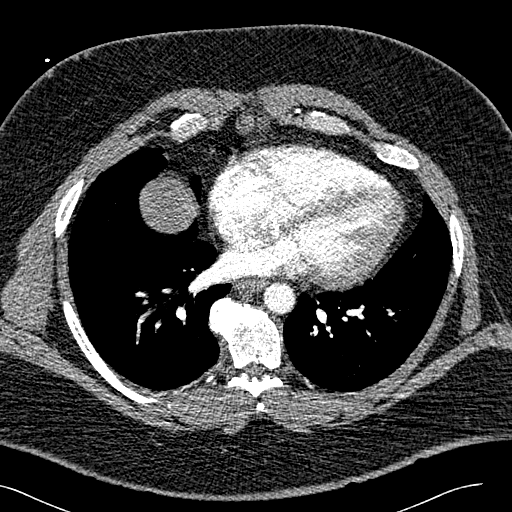
[im 100/258  lung]
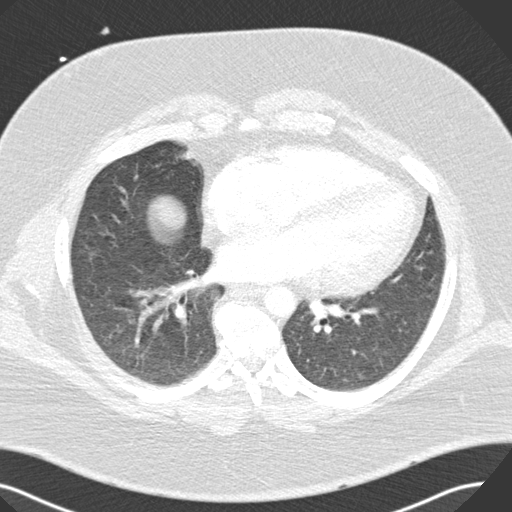
[im 115/258  mediastinal]
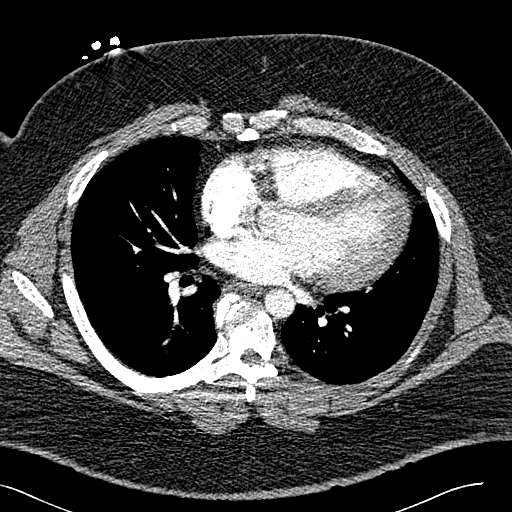
[im 129/258  lung]
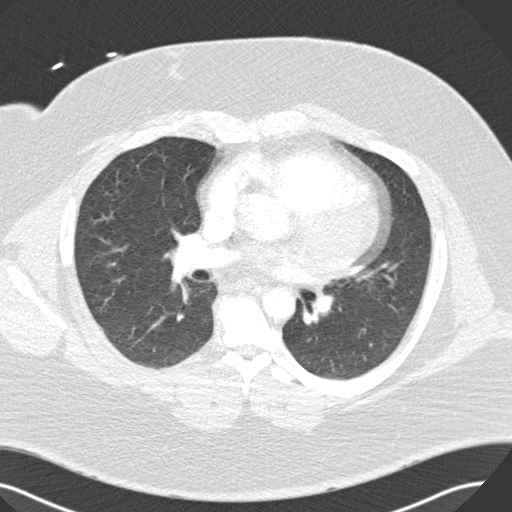
[im 143/258  mediastinal]
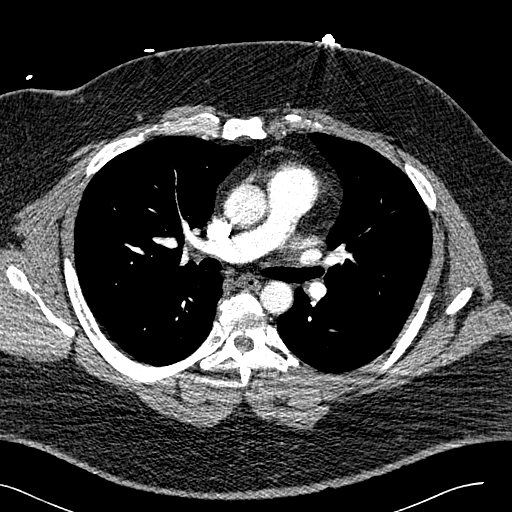
[im 158/258  lung]
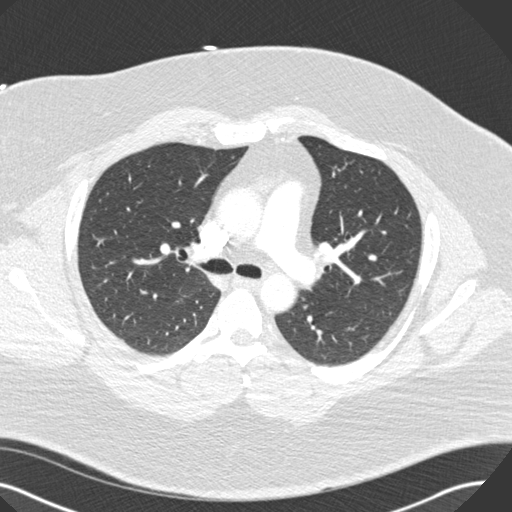
[im 172/258  mediastinal]
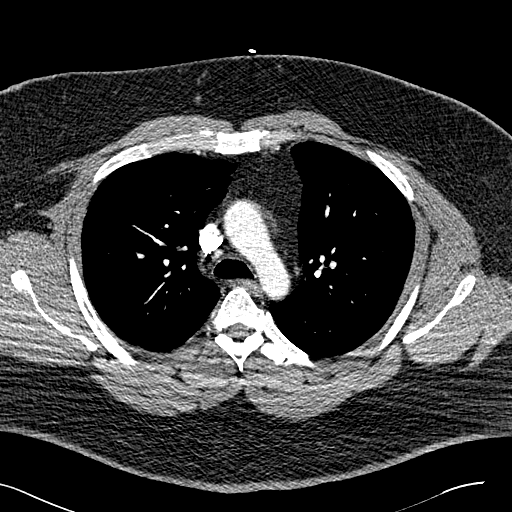
[im 186/258  lung]
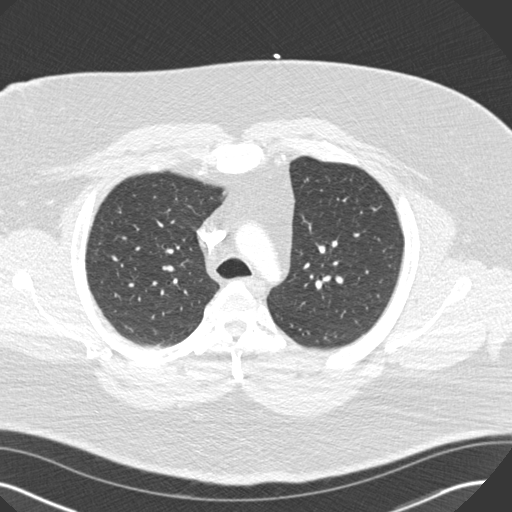
[im 200/258  mediastinal]
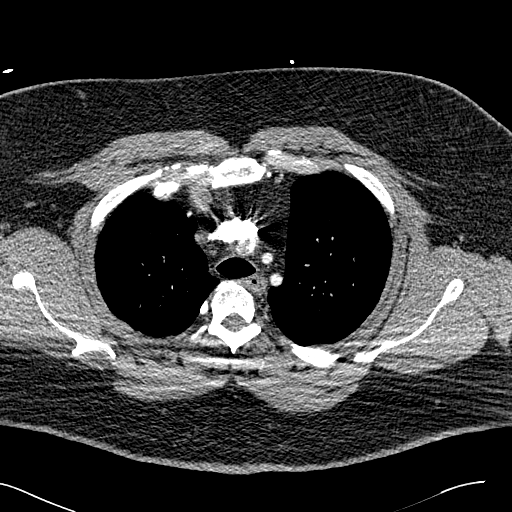
[im 215/258  lung]
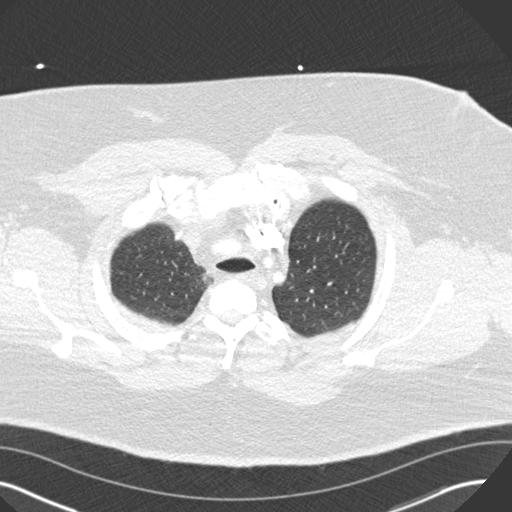
[im 229/258  mediastinal]
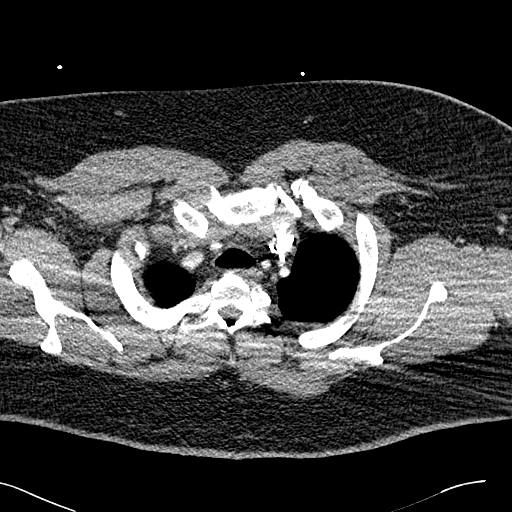
[im 243/258  lung]
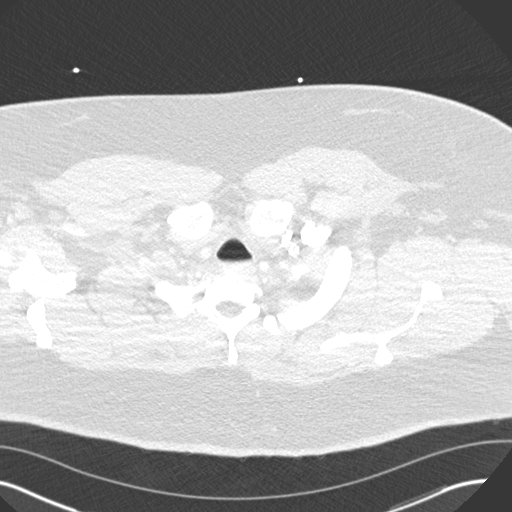

[Series 7: cor pe 2.0 mpr · coronal · 0.64mm/px · 1 of 119 slices shown]
[im 60/119  mediastinal]
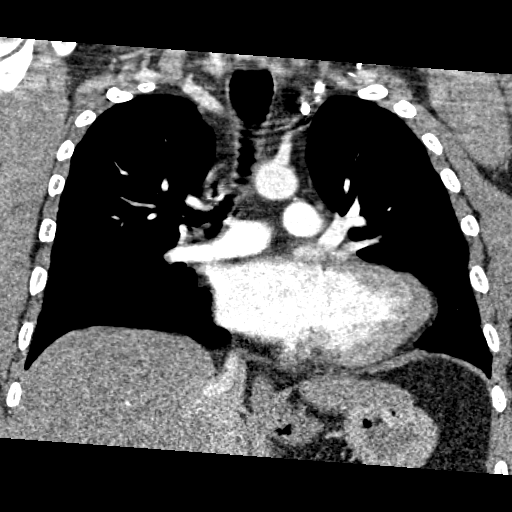

[18 of 36 positions shown; findings below may reference images not displayed]

FINDINGS: There is no evidence of pulmonary embolus.

The lungs are clear bilaterally. There is no evidence of significant
focal consolidation, pleural effusion or pneumothorax. No masses are
identified; no abnormal focal contrast enhancement is seen.

The mediastinum is unremarkable in appearance. No mediastinal
lymphadenopathy is seen. No pericardial effusion is identified. The
great vessels are grossly unremarkable in appearance. No axillary
lymphadenopathy is seen. The minimally visualized portions of the
thyroid gland are unremarkable in appearance.

The visualized portions of the liver and spleen are unremarkable.

No acute osseous abnormalities are seen. Incidental note is made of
bilateral cervical ribs, more prominent on the right.

Review of the MIP images confirms the above findings.
IMPRESSION: 1. No evidence of pulmonary embolus.
2. Lungs clear bilaterally.
3. Incidental note of bilateral cervical ribs, more prominent on the
right.

## 2016-05-10 ENCOUNTER — Encounter: Payer: Self-pay | Admitting: Family Medicine

## 2016-05-10 ENCOUNTER — Ambulatory Visit (INDEPENDENT_AMBULATORY_CARE_PROVIDER_SITE_OTHER): Payer: Medicare Other | Admitting: Family Medicine

## 2016-05-10 VITALS — BP 145/100 | HR 91 | Temp 98.0°F | Resp 16 | Ht 73.0 in | Wt 310.0 lb

## 2016-05-10 DIAGNOSIS — Z6841 Body Mass Index (BMI) 40.0 and over, adult: Secondary | ICD-10-CM | POA: Diagnosis not present

## 2016-05-10 DIAGNOSIS — I10 Essential (primary) hypertension: Secondary | ICD-10-CM

## 2016-05-10 DIAGNOSIS — Z0000 Encounter for general adult medical examination without abnormal findings: Secondary | ICD-10-CM

## 2016-05-10 DIAGNOSIS — T560X1S Toxic effect of lead and its compounds, accidental (unintentional), sequela: Secondary | ICD-10-CM

## 2016-05-10 DIAGNOSIS — E6609 Other obesity due to excess calories: Secondary | ICD-10-CM | POA: Diagnosis not present

## 2016-05-10 DIAGNOSIS — L918 Other hypertrophic disorders of the skin: Secondary | ICD-10-CM | POA: Diagnosis not present

## 2016-05-10 DIAGNOSIS — E118 Type 2 diabetes mellitus with unspecified complications: Secondary | ICD-10-CM

## 2016-05-10 DIAGNOSIS — IMO0001 Reserved for inherently not codable concepts without codable children: Secondary | ICD-10-CM

## 2016-05-10 DIAGNOSIS — R000 Tachycardia, unspecified: Secondary | ICD-10-CM

## 2016-05-10 DIAGNOSIS — M1A19X0 Lead-induced chronic gout, multiple sites, without tophus (tophi): Secondary | ICD-10-CM

## 2016-05-10 DIAGNOSIS — K219 Gastro-esophageal reflux disease without esophagitis: Secondary | ICD-10-CM | POA: Diagnosis not present

## 2016-05-10 MED ORDER — METOPROLOL SUCCINATE ER 200 MG PO TB24: 200.0000 mg | ORAL_TABLET | Freq: Every day | ORAL | 3 refills | Status: DC

## 2016-05-10 NOTE — Progress Notes (Signed)
Name: Alan Eaton   MRN: [id number]    DOB: [birth year]   Date:05/10/2016       Progress Note  Subjective  Chief Complaint  Chief Complaint  Patient presents with  . Hypertension    HPI  Here for f/u of HBP and tachycardia.   He has hx of gout but has not had a flair in a very long time.  Wants to decrease his gout med.  Taking al;l other meds and cont to lose weight.  No problem-specific Assessment & Plan notes found for this encounter.   Past Medical History:  Diagnosis Date  . Alcohol abuse   . Bronchitis   . Depression   . Diabetes mellitus without complication ([place])   . GERD (gastroesophageal reflux disease)   . Gout   . Hypertension   . Morbid obesity ([place])   . Suicide ideation     Past Surgical History:  Procedure Laterality Date  . none      Family History  Problem Relation Age of Onset  . Hypertension    . Diabetes Mellitus II      Social History   Social History  . Marital status: Divorced    Spouse name: N/A  . Number of children: N/A  . Years of education: N/A   Occupational History  . Not on file.   Social History Main Topics  . Smoking status: Current Every Day Smoker    Packs/day: 0.50    Types: Cigarettes  . Smokeless tobacco: Never Used  . Alcohol use 0.0 oz/week     Comment: occasional  . Drug use: Yes    Frequency: 1.0 time per week    Types: Marijuana, Cocaine     Comment: last use of crack was 04/12/16  . Sexual activity: Not on file   Other Topics Concern  . Not on file   Social History Narrative  . No narrative on file     Current Outpatient Prescriptions:  .  allopurinol (ZYLOPRIM) 300 MG tablet, Take 300 mg by mouth daily., Disp: , Rfl:  .  aspirin EC 81 MG tablet, Take 81 mg by mouth daily., Disp: , Rfl:  .  B Complex-C (B-COMPLEX WITH VITAMIN C) tablet, Take 1 tablet by mouth daily., Disp: , Rfl:  .  colchicine 0.6 MG tablet, Take 0.6 mg by mouth daily as needed., Disp: , Rfl:  .  famotidine (PEPCID) 40 MG  tablet, TAKE 1 TABLET BY MOUTH BEDTIME FOR STOMACH ACID, Disp: 30 tablet, Rfl: 11 .  losartan (COZAAR) 100 MG tablet, TAKE 1 TABLET BY MOUTH ONCE DAILY, Disp: 90 tablet, Rfl: 3 .  meloxicam (MOBIC) 7.5 MG tablet, Take 7.5 mg by mouth daily., Disp: , Rfl:  .  metoprolol succinate (TOPROL-XL) 200 MG 24 hr tablet, Take 1 tablet (200 mg total) by mouth daily. Take with or immediately following a meal., Disp: 90 tablet, Rfl: 3 .  Multiple Vitamin (MULTIVITAMIN WITH MINERALS) TABS tablet, Take 1 tablet by mouth daily., Disp: , Rfl:  .  promethazine (PHENERGAN) 25 MG tablet, Take 25 mg by mouth every 6 (six) hours as needed for nausea or vomiting., Disp: , Rfl:  .  VENTOLIN HFA 108 (90 Base) MCG/ACT inhaler, INHALE 2 PUFFS BY MOUTH EVERY 4 TO 6 HOURS AS NEEDED., Disp: 18 g, Rfl: 12  Allergies  Allergen Reactions  . Bee Venom Swelling  . Bactrim [Sulfamethoxazole-Trimethoprim] Swelling and Rash     Review of Systems  Constitutional: Positive for weight loss (desired).  Negative for chills, fever and malaise/fatigue.  HENT: Negative for hearing loss and tinnitus.   Eyes: Negative for blurred vision and double vision.  Respiratory: Negative for cough, shortness of breath and wheezing.   Cardiovascular: Negative for chest pain, palpitations and leg swelling.  Gastrointestinal: Negative for abdominal pain, blood in stool and heartburn.  Genitourinary: Negative for dysuria, frequency and urgency.  Musculoskeletal: Negative for joint pain and myalgias.  Skin: Negative for rash.  Neurological: Negative for dizziness, tingling, tremors, weakness and headaches.       Objective  Vitals:   05/10/16 1537 05/10/16 1638  BP: (!) 144/80 (!) 145/100  Pulse: 91   Resp: 16   Temp: 98 F (36.7 C)   TempSrc: Oral   Weight: (!) 310 lb (140.6 kg)   Height: 6' 1"  (1.854 m)     Physical Exam  Constitutional: He is oriented to person, place, and time and well-developed, well-nourished, and in no  distress. No distress.  HENT:  Head: Normocephalic and atraumatic.  Eyes: Conjunctivae and EOM are normal. Pupils are equal, round, and reactive to light. No scleral icterus.  Neck: Normal range of motion. Neck supple. No thyromegaly present.  Cardiovascular: Normal rate, regular rhythm and normal heart sounds.  Exam reveals no gallop and no friction rub.   No murmur heard. Pulmonary/Chest: Effort normal and breath sounds normal. No respiratory distress. He has no wheezes. He has no rales.  Musculoskeletal: He exhibits no edema.  Lymphadenopathy:    He has no cervical adenopathy.  Neurological: He is alert and oriented to person, place, and time.  Vitals reviewed.       Recent Results (from the past 2160 hour(s))  Glucose, capillary     Status: None   Collection Time: 03/31/16 11:51 PM  Result Value Ref Range   Glucose-Capillary 76 65 - 99 mg/dL  Comprehensive metabolic panel     Status: Abnormal   Collection Time: 04/14/16  9:20 PM  Result Value Ref Range   Sodium 141 135 - 145 mmol/L   Potassium 4.0 3.5 - 5.1 mmol/L   Chloride 108 101 - 111 mmol/L   CO2 26 22 - 32 mmol/L   Glucose, Bld 104 (H) 65 - 99 mg/dL   BUN 9 6 - 20 mg/dL   Creatinine, Ser 1.42 (H) 0.61 - 1.24 mg/dL   Calcium 9.0 8.9 - 10.3 mg/dL   Total Protein 7.8 6.5 - 8.1 g/dL   Albumin 4.3 3.5 - 5.0 g/dL   AST 33 15 - 41 U/L   ALT 48 17 - 63 U/L   Alkaline Phosphatase 61 38 - 126 U/L   Total Bilirubin 0.7 0.3 - 1.2 mg/dL   GFR calc non Af Amer 60 (L) >60 mL/min   GFR calc Af Amer >60 >60 mL/min    Comment: (NOTE) The eGFR has been calculated using the CKD EPI equation. This calculation has not been validated in all clinical situations. eGFR's persistently <60 mL/min signify possible Chronic Kidney Disease.    Anion gap 7 5 - 15  Ethanol     Status: None   Collection Time: 04/14/16  9:20 PM  Result Value Ref Range   Alcohol, Ethyl (B) <5 <5 mg/dL    Comment:        LOWEST DETECTABLE LIMIT FOR SERUM  ALCOHOL IS 5 mg/dL FOR MEDICAL PURPOSES ONLY   cbc     Status: Abnormal   Collection Time: 04/14/16  9:20 PM  Result Value Ref Range  WBC 13.1 (H) 3.8 - 10.6 K/uL   RBC 4.78 4.40 - 5.90 MIL/uL   Hemoglobin 14.5 13.0 - 18.0 g/dL   HCT 43.9 40.0 - 52.0 %   MCV 91.9 80.0 - 100.0 fL   MCH 30.3 26.0 - 34.0 pg   MCHC 33.0 32.0 - 36.0 g/dL   RDW 14.7 (H) 11.5 - 14.5 %   Platelets 227 150 - 440 K/uL  Urine Drug Screen, Qualitative     Status: Abnormal   Collection Time: 04/14/16  9:20 PM  Result Value Ref Range   Tricyclic, Ur Screen NONE DETECTED NONE DETECTED   Amphetamines, Ur Screen NONE DETECTED NONE DETECTED   MDMA (Ecstasy)Ur Screen NONE DETECTED NONE DETECTED   Cocaine Metabolite,Ur [place] POSITIVE (A) NONE DETECTED   Opiate, Ur Screen NONE DETECTED NONE DETECTED   Phencyclidine (PCP) Ur S NONE DETECTED NONE DETECTED   Cannabinoid 50 Ng, Ur [place] NONE DETECTED NONE DETECTED   Barbiturates, Ur Screen NONE DETECTED NONE DETECTED   Benzodiazepine, Ur Scrn NONE DETECTED NONE DETECTED   Methadone Scn, Ur NONE DETECTED NONE DETECTED    Comment: (NOTE) 630  Tricyclics, urine               Cutoff 1000 ng/mL 200  Amphetamines, urine             Cutoff 1000 ng/mL 300  MDMA (Ecstasy), urine           Cutoff 500 ng/mL 400  Cocaine Metabolite, urine       Cutoff 300 ng/mL 500  Opiate, urine                   Cutoff 300 ng/mL 600  Phencyclidine (PCP), urine      Cutoff 25 ng/mL 700  Cannabinoid, urine              Cutoff 50 ng/mL 800  Barbiturates, urine             Cutoff 200 ng/mL 900  Benzodiazepine, urine           Cutoff 200 ng/mL 1000 Methadone, urine                Cutoff 300 ng/mL 1100 1200 The urine drug screen provides only a preliminary, unconfirmed 1300 analytical test result and should not be used for non-medical 1400 purposes. Clinical consideration and professional judgment should 1500 be applied to any positive drug screen result due to possible 1600 interfering substances. A  more specific alternate chemical method 1700 must be used in order to obtain a confirmed analytical result.  1800 Gas chromato graphy / mass spectrometry (GC/MS) is the preferred 1900 confirmatory method.   TSH     Status: None   Collection Time: 04/19/16  4:21 PM  Result Value Ref Range   TSH 1.52 0.40 - 4.50 mIU/L  COMPLETE METABOLIC PANEL WITH GFR     Status: Abnormal   Collection Time: 04/20/16 12:01 AM  Result Value Ref Range   Sodium 140 135 - 146 mmol/L   Potassium 4.4 3.5 - 5.3 mmol/L   Chloride 104 98 - 110 mmol/L   CO2 31 20 - 31 mmol/L   Glucose, Bld 102 (H) 65 - 99 mg/dL   BUN 11 7 - 25 mg/dL   Creat 1.29 0.60 - 1.35 mg/dL   Total Bilirubin 0.4 0.2 - 1.2 mg/dL   Alkaline Phosphatase 51 40 - 115 U/L   AST 24 10 - 40 U/L  ALT 44 9 - 46 U/L   Total Protein 7.1 6.1 - 8.1 g/dL   Albumin 4.0 3.6 - 5.1 g/dL   Calcium 9.4 8.6 - 10.3 mg/dL   GFR, Est African American 79 >=60 mL/min   GFR, Est Non African American 68 >=60 mL/min  CBC with Differential     Status: None   Collection Time: 04/20/16 12:01 AM  Result Value Ref Range   WBC 9.5 3.8 - 10.8 K/uL   RBC 4.91 4.20 - 5.80 MIL/uL   Hemoglobin 15.0 13.2 - 17.1 g/dL   HCT 45.5 38.5 - 50.0 %   MCV 92.7 80.0 - 100.0 fL   MCH 30.5 27.0 - 33.0 pg   MCHC 33.0 32.0 - 36.0 g/dL   RDW 14.8 11.0 - 15.0 %   Platelets 207 140 - 400 K/uL   MPV 10.7 7.5 - 12.5 fL   Neutro Abs 5,605 1,500 - 7,800 cells/uL   Lymphs Abs 2,470 850 - 3,900 cells/uL   Monocytes Absolute 950 200 - 950 cells/uL   Eosinophils Absolute 475 15 - 500 cells/uL   Basophils Absolute 0 0 - 200 cells/uL   Neutrophils Relative % 59 %   Lymphocytes Relative 26 %   Monocytes Relative 10 %   Eosinophils Relative 5 %   Basophils Relative 0 %   Smear Review Criteria for review not met   Lipid Profile     Status: Abnormal   Collection Time: 04/20/16 12:01 AM  Result Value Ref Range   Cholesterol 121 <200 mg/dL   Triglycerides 88 <150 mg/dL   HDL 38 (L) >40  mg/dL   Total CHOL/HDL Ratio 3.2 <5.0 Ratio   VLDL 18 <30 mg/dL   LDL Cholesterol 65 <100 mg/dL     Assessment & Plan  Problem List Items Addressed This Visit      Cardiovascular and Mediastinum   HTN (hypertension) - Primary   Relevant Medications   metoprolol succinate (TOPROL-XL) 200 MG 24 hr tablet     Digestive   GERD (gastroesophageal reflux disease)     Endocrine   Diabetes ([place])     Other   Obesity   Gout   Healthcare maintenance   Tachycardia   Relevant Medications   metoprolol succinate (TOPROL-XL) 200 MG 24 hr tablet    Other Visit Diagnoses    Skin tags, multiple acquired       Relevant Orders   Ambulatory referral to Dermatology      Meds ordered this encounter  Medications  . metoprolol succinate (TOPROL-XL) 200 MG 24 hr tablet    Sig: Take 1 tablet (200 mg total) by mouth daily. Take with or immediately following a meal.    Dispense:  90 tablet    Refill:  3  1. Essential hypertension Cont Losartan - metoprolol succinate (TOPROL-XL) 200 MG 24 hr tablet; Take 1 tablet (200 mg total) by mouth daily. Take with or immediately following a meal.  Dispense: 90 tablet; Refill: 3  2. Gastroesophageal reflux disease without esophagitis   3. Type 2 diabetes mellitus with complication, without long-term current use of insulin (H[facility]) Cont to lose weight  4. Class 3 obesity due to excess calories without serious comorbidity with body mass index (BMI) of 40.0 to 44.9 in adult ([place])   5. Tachycardia  - metoprolol succinate (TOPROL-XL) 200 MG 24 hr tablet; Take 1 tablet (200 mg total) by mouth daily. Take with or immediately following a meal.  Dispense: 90 tablet; Refill: 3  6. 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 Healthcare maintenance   8. Skin tags, multiple acquired  - Ambulatory referral to Dermatology

## 2016-06-14 LAB — HEMOGLOBIN A1C: HEMOGLOBIN A1C: 6

## 2016-06-21 DIAGNOSIS — L821 Other seborrheic keratosis: Secondary | ICD-10-CM | POA: Diagnosis not present

## 2016-06-21 DIAGNOSIS — L918 Other hypertrophic disorders of the skin: Secondary | ICD-10-CM | POA: Diagnosis not present

## 2016-06-21 DIAGNOSIS — D485 Neoplasm of uncertain behavior of skin: Secondary | ICD-10-CM | POA: Diagnosis not present

## 2016-07-01 ENCOUNTER — Emergency Department: Payer: Medicare Other

## 2016-07-01 ENCOUNTER — Other Ambulatory Visit: Payer: Self-pay

## 2016-07-01 ENCOUNTER — Encounter: Payer: Self-pay | Admitting: *Deleted

## 2016-07-01 ENCOUNTER — Emergency Department
Admission: EM | Admit: 2016-07-01 | Discharge: 2016-07-01 | Disposition: A | Discharge status: Home / Self Care | Payer: Medicare Other | Attending: Emergency Medicine | Admitting: Emergency Medicine

## 2016-07-01 DIAGNOSIS — I10 Essential (primary) hypertension: Secondary | ICD-10-CM | POA: Diagnosis not present

## 2016-07-01 DIAGNOSIS — F17210 Nicotine dependence, cigarettes, uncomplicated: Secondary | ICD-10-CM | POA: Diagnosis not present

## 2016-07-01 DIAGNOSIS — Z79899 Other long term (current) drug therapy: Secondary | ICD-10-CM | POA: Diagnosis not present

## 2016-07-01 DIAGNOSIS — E119 Type 2 diabetes mellitus without complications: Secondary | ICD-10-CM | POA: Diagnosis not present

## 2016-07-01 DIAGNOSIS — R42 Dizziness and giddiness: Secondary | ICD-10-CM

## 2016-07-01 DIAGNOSIS — Z7984 Long term (current) use of oral hypoglycemic drugs: Secondary | ICD-10-CM | POA: Diagnosis not present

## 2016-07-01 DIAGNOSIS — R51 Headache: Secondary | ICD-10-CM | POA: Diagnosis not present

## 2016-07-01 LAB — CBC
HEMATOCRIT: 45.7 % (ref 40.0–52.0)
HEMOGLOBIN: 15.2 g/dL (ref 13.0–18.0)
MCH: 30 pg (ref 26.0–34.0)
MCHC: 33.3 g/dL (ref 32.0–36.0)
MCV: 90.2 fL (ref 80.0–100.0)
Platelets: 206 10*3/uL (ref 150–440)
RBC: 5.06 MIL/uL (ref 4.40–5.90)
RDW: 14.9 % — ABNORMAL HIGH (ref 11.5–14.5)
WBC: 9.7 10*3/uL (ref 3.8–10.6)

## 2016-07-01 LAB — BASIC METABOLIC PANEL
ANION GAP: 5 (ref 5–15)
BUN: 10 mg/dL (ref 6–20)
CO2: 30 mmol/L (ref 22–32)
Calcium: 9.4 mg/dL (ref 8.9–10.3)
Chloride: 104 mmol/L (ref 101–111)
Creatinine, Ser: 1.29 mg/dL — ABNORMAL HIGH (ref 0.61–1.24)
GFR calc Af Amer: >60 mL/min (ref >60)
GLUCOSE: 112 mg/dL — AB (ref 65–99)
POTASSIUM: 4 mmol/L (ref 3.5–5.1)
Sodium: 139 mmol/L (ref 135–145)

## 2016-07-01 LAB — TROPONIN I: Troponin I: <0.03 ng/mL (ref <0.03)

## 2016-07-01 MED ORDER — MECLIZINE HCL 25 MG PO TABS: 25.0000 mg | ORAL_TABLET | Freq: Three times a day (TID) | ORAL | 1 refills | Status: DC | PRN

## 2016-07-01 MED ORDER — MECLIZINE HCL 25 MG PO TABS
50.0000 mg | ORAL_TABLET | Freq: Once | ORAL | Status: AC
  Administered 2016-07-01: 50 mg via ORAL
  Filled 2016-07-01: qty 2

## 2016-07-01 NOTE — ED Notes (Signed)
Patient transported to CT 

## 2016-07-01 NOTE — ED Triage Notes (Signed)
Pt to triage via wheelchair.  Pt states blood pressure elevated today.  Pt took bp meds today.  Pt reports dizziness today.  Pt has a headache.  Pt alert.  Speech clear.

## 2016-07-01 NOTE — ED Provider Notes (Signed)
[facility] Emergency Department Provider Note  ____________________________________________  Time seen: Approximately 8:52 PM  I have reviewed the triage vital signs and the nursing notes.   HISTORY  Chief Complaint Hypertension    HPI Alan Eaton is a 41 y.o. male who complains of bilateral frontal headache and dizziness is worse with changes of position started today. He also notices that his blood pressure was higher than normal. He takes metoprolol XL and losartan for his blood pressure control and reports compliance with these medications. At home he found his blood pressure was 180/120. Denies any sudden thunderclap headache neck pain or vision changes. No numbness tingling or weakness. No syncope. No recent illness or trauma.  Has a history of vertigo.     Past Medical History:  Diagnosis Date  . Alcohol abuse   . Bronchitis   . Depression   . Diabetes mellitus without complication ([place])   . GERD (gastroesophageal reflux disease)   . Gout   . Hypertension   . Morbid obesity ([place])   . Suicide ideation      Patient Active Problem List   Diagnosis Date Noted  . Healthcare maintenance 04/19/2016  . Tachycardia 04/19/2016  . Strep throat 01/20/2016  . Factitious disorder 01/13/2016  . Unspecified depressive disorder 01/13/2016  . Alcohol use disorder, severe, dependence ([place]) 01/13/2016  . 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 HTN (hypertension) 01/05/2016  . Gout 01/05/2016  . GERD (gastroesophageal reflux disease) 01/05/2016  . Diabetes ([place]) 01/05/2016  . Tobacco use disorder 01/05/2016  . Obesity 10/27/2015     Past Surgical History:  Procedure Laterality Date  . none       Prior to Admission medications   Medication Sig Start Date End Date Taking? Authorizing Provider  allopurinol (ZYLOPRIM) 300 MG tablet Take 300 mg by mouth daily.    Historical Provider, MD  aspirin EC 81 MG tablet Take 81 mg by mouth daily.     Historical Provider, MD  B Complex-C (B-COMPLEX WITH VITAMIN C) tablet Take 1 tablet by mouth daily.    Historical Provider, MD  colchicine 0.6 MG tablet Take 0.6 mg by mouth daily as needed. 09/15/15   Historical Provider, MD  famotidine (PEPCID) 40 MG tablet TAKE 1 TABLET BY MOUTH BEDTIME FOR STOMACH ACID 04/22/16   Arlis Porta., MD  losartan (COZAAR) 100 MG tablet TAKE 1 TABLET BY MOUTH ONCE DAILY 01/22/16   Arlis Porta., MD  meclizine (ANTIVERT) 25 MG tablet Take 1 tablet (25 mg total) by mouth 3 (three) times daily as needed for dizziness or nausea. 07/01/16   Carrie Mew, MD  meloxicam (MOBIC) 7.5 MG tablet Take 7.5 mg by mouth daily.    Historical Provider, MD  metoprolol succinate (TOPROL-XL) 200 MG 24 hr tablet Take 1 tablet (200 mg total) by mouth daily. Take with or immediately following a meal. 05/10/16   Arlis Porta., MD  Multiple Vitamin (MULTIVITAMIN WITH MINERALS) TABS tablet Take 1 tablet by mouth daily.    Historical Provider, MD  promethazine (PHENERGAN) 25 MG tablet Take 25 mg by mouth every 6 (six) hours as needed for nausea or vomiting.    Historical Provider, MD  VENTOLIN HFA 108 (90 Base) MCG/ACT inhaler INHALE 2 PUFFS BY MOUTH EVERY 4 TO 6 HOURS AS NEEDED. 11/18/15   Arlis Porta., MD     Allergies Bee venom and Bactrim [sulfamethoxazole-trimethoprim]   Family History  Problem Relation Age of Onset  .  Hypertension    . Diabetes Mellitus II      Social History Social History  Substance Use Topics  . Smoking status: Current Every Day Smoker    Packs/day: 0.50    Types: Cigarettes  . Smokeless tobacco: Never Used  . Alcohol use No     Comment: occasional    Review of Systems  Constitutional:   No fever or chills.  ENT:   No sore throat. No rhinorrhea. Cardiovascular:   No chest pain. Respiratory:   No dyspnea or cough. Gastrointestinal:   Negative for abdominal pain, vomiting and diarrhea.  Genitourinary:   Negative for dysuria  or difficulty urinating. Musculoskeletal:   Negative for focal pain or swelling Neurological:   Positive as above for headaches 10-point ROS otherwise negative.  ____________________________________________   PHYSICAL EXAM:  VITAL SIGNS: ED Triage Vitals  Enc Vitals Group     BP 07/01/16 1855 140/90     Pulse Rate 07/01/16 1855 75     Resp 07/01/16 1855 20     Temp 07/01/16 1855 97.6 F (36.4 C)     Temp Source 07/01/16 1855 Oral     SpO2 07/01/16 1855 97 %     Weight 07/01/16 1758 280 lb (127 kg)     Height 07/01/16 1758 6\' 1"  (1.854 m)     Head Circumference --      Peak Flow --      Pain Score 07/01/16 1758 10     Pain Loc --      Pain Edu? --      Excl. in [place]? --     Vital signs reviewed, nursing assessments reviewed.   Constitutional:   Alert and oriented. Well appearing and in no distress. Eyes:   No scleral icterus. No conjunctival pallor. PERRL. EOMI.  No nystagmus. ENT   Head:   Normocephalic and atraumatic.   Nose:   No congestion/rhinnorhea. No septal hematoma   Mouth/Throat:   MMM, no pharyngeal erythema. No peritonsillar mass.    Neck:   No stridor. No SubQ emphysema. No meningismus. Hematological/Lymphatic/Immunilogical:   No cervical lymphadenopathy. Cardiovascular:   RRR. Symmetric bilateral radial and DP pulses.  No murmurs.  Respiratory:   Normal respiratory effort without tachypnea nor retractions. Breath sounds are clear and equal bilaterally. No wheezes/rales/rhonchi. Gastrointestinal:   Soft and nontender. Non distended. There is no CVA tenderness.  No rebound, rigidity, or guarding. Genitourinary:   deferred Musculoskeletal:   Normal range of motion in all extremities. No joint effusions.  No lower extremity tenderness.  No edema. Neurologic:   Normal speech and language.  CN 2-10 normal. Normal gait, no pronator drift. Normal cerebellar function. Patient reports subjectively worsened dizziness with standing. Motor grossly  intact. No gross focal neurologic deficits are appreciated.  Skin:    Skin is warm, dry and intact. No rash noted.  No petechiae, purpura, or bullae.  ____________________________________________    LABS (pertinent positives/negatives) (all labs ordered are listed, but only abnormal results are displayed) Labs Reviewed  BASIC METABOLIC PANEL - Abnormal; Notable for the following:       Result Value   Glucose, Bld 112 (*)    Creatinine, Ser 1.29 (*)    All other components within normal limits  CBC - Abnormal; Notable for the following:    RDW 14.9 (*)    All other components within normal limits  TROPONIN I   ____________________________________________   EKG  Interpreted by me Normal sinus rhythm rate of 69,  normal axis intervals QRS ST segments and T waves. Poor R-wave progression in anterior precordial leads. Unchanged from 01/02/2016.  ____________________________________________    RADIOLOGY  Ct Head Wo Contrast  Result Date: 07/01/2016 CLINICAL DATA:  Hypertension with dizziness and headache EXAM: CT HEAD WITHOUT CONTRAST TECHNIQUE: Contiguous axial images were obtained from the base of the skull through the vertex without intravenous contrast. COMPARISON:  04/02/2012 CT head FINDINGS: BRAIN: The ventricles and sulci are normal. No intraparenchymal hemorrhage, mass effect nor midline shift. No acute large vascular territory infarcts. Grey-white matter distinction is maintained. The basal ganglia are unremarkable. No abnormal extra-axial fluid collections. Basal cisterns are not effaced and midline. The brainstem and cerebellar hemispheres are without acute abnormalities. VASCULAR: No hyperdense vessels. SKULL/SOFT TISSUES: No skull fracture. No significant soft tissue swelling. ORBITS/SINUSES: The included ocular globes and orbital contents are normal.The mastoid air cells are clear. The included paranasal sinuses are well-aerated. OTHER: None. IMPRESSION: No acute  intracranial abnormality. No significant change from prior exam. Electronically Signed   By: Ashley Royalty M.D.   On: 07/01/2016 19:48    ____________________________________________   PROCEDURES Procedures  ____________________________________________   INITIAL IMPRESSION / ASSESSMENT AND PLAN / ED COURSE  Pertinent labs & imaging results that were available during my care of the patient were reviewed by me and considered in my medical decision making (see chart for details).  Patient well appearing no acute distress, presents with dizziness and headache, most likely due to vertigo. Also reports he hasn't been eating or drinking much today and so there could be an element of dehydration to this.Considering the patient's symptoms, medical history, and physical examination today, I have low suspicion for ischemic stroke, intracranial hemorrhage, meningitis, encephalitis, carotid or vertebral dissection, venous sinus thrombosis, MS, intracranial hypertension, glaucoma, CRAO, CRVO, or temporal arteritis. Symptoms are atypical for an acute intracranial event, CT head performed due to his hypertension which is already improved. CT was negative. Patient tolerating oral intake. We'll discharge home to follow up with primary care for blood pressure recheck.         ____________________________________________   FINAL CLINICAL IMPRESSION(S) / ED DIAGNOSES  Final diagnoses:  Hypertension, unspecified type  Vertigo      New Prescriptions   MECLIZINE (ANTIVERT) 25 MG TABLET    Take 1 tablet (25 mg total) by mouth 3 (three) times daily as needed for dizziness or nausea.     Portions of this note were generated with dragon dictation software. Dictation errors may occur despite best attempts at proofreading.    Carrie Mew, MD 07/01/16 2056

## 2016-07-01 NOTE — ED Notes (Signed)
Patient reports around 11:00am-12:00pm today he was lightheaded and his stomach was upset. Took blood pressure and it was elevated 680'H systollic with diastolic in 212'Y. Patient did take blood pressure medication this morning.

## 2016-07-16 ENCOUNTER — Encounter: Payer: Self-pay | Admitting: Nurse Practitioner

## 2016-07-16 ENCOUNTER — Ambulatory Visit (INDEPENDENT_AMBULATORY_CARE_PROVIDER_SITE_OTHER): Payer: Medicare Other | Admitting: Nurse Practitioner

## 2016-07-16 VITALS — BP 151/83 | HR 94 | Temp 97.8°F | Wt 311.0 lb

## 2016-07-16 DIAGNOSIS — F17200 Nicotine dependence, unspecified, uncomplicated: Secondary | ICD-10-CM | POA: Diagnosis not present

## 2016-07-16 DIAGNOSIS — R059 Cough, unspecified: Secondary | ICD-10-CM

## 2016-07-16 DIAGNOSIS — R05 Cough: Secondary | ICD-10-CM

## 2016-07-16 MED ORDER — BENZONATATE 100 MG PO CAPS: ORAL_CAPSULE | ORAL | 0 refills | Status: DC

## 2016-07-16 NOTE — Progress Notes (Signed)
Subjective:    Patient ID: Alan Eaton, male    DOB: 02-05-1976, 41 y.o.   MRN: [id number]  Alan Eaton is a 41 y.o. male presenting on 07/16/2016 for Cough   HPI   Productive cough Alan Eaton has a cough that started on Wednesday (2 days ago).  It is a productive cough with "yellowish phlegm."  He states the productive cough is his only symptom. He denies nasal congestion, rhinorrhea, sinus congestion/pressure, fever, chills, sweats, nausea, vomiting, diarrhea or constipation.   Has taken robitussin without relief.  He also uses an albuterol inhaler as needed and normally only uses 2 puffs once 2 days per week.  Since Wednesday he has needed 2 puffs 1-2x per day.  He does smoke, but states he has reduced his smoking down to 1 ppd.  He does desire to quit and started cutting back about 1 year ago.  He has begun using e-cigarettes to help cut back. He is using using it 1-2 days per week with 1-2 breaths (15 mg nicotine serum).   He is also losing weight and has lost about 90 lbs in the last year.  He is continuing to lose weight by increasing physical activity and cutting back on food intake. His physical activity is "walking around the block."  He has maintained cutting back on food and has added lots more fruit.  He is currently living with parents who are very supportive.  Social History  Substance Use Topics  . Smoking status: Current Every Day Smoker    Packs/day: 0.50    Types: Cigarettes  . Smokeless tobacco: Never Used  . Alcohol use No     Comment: occasional    Review of Systems Per HPI unless specifically indicated above     Objective:    BP (!) 151/83 (BP Location: Right Arm, Patient Position: Sitting, Cuff Size: Large)   Pulse 94   Temp 97.8 F (36.6 C) (Oral)   Wt (!) 311 lb (141.1 kg)   SpO2 97%   BMI 41.03 kg/m   Wt Readings from Last 3 Encounters:  07/16/16 (!) 311 lb (141.1 kg)  07/01/16 280 lb (127 kg)  05/10/16 (!) 310 lb (140.6 kg)    Physical  Exam  Constitutional: He is oriented to person, place, and time. He appears well-developed and well-nourished.  HENT:  Head: Normocephalic and atraumatic.  Right Ear: Tympanic membrane, external ear and ear canal normal.  Left Ear: Tympanic membrane, external ear and ear canal normal.  Nose: Right sinus exhibits no maxillary sinus tenderness and no frontal sinus tenderness. Left sinus exhibits no maxillary sinus tenderness and no frontal sinus tenderness.  Mouth/Throat: Uvula is midline, oropharynx is clear and moist and mucous membranes are normal. Abnormal dentition.  Missing teeth  Eyes: Conjunctivae are normal. Pupils are equal, round, and reactive to light.  Neck: Normal range of motion. Neck supple. No JVD present. No tracheal deviation present.  Cardiovascular: Normal rate, regular rhythm, normal heart sounds and intact distal pulses.   Pulmonary/Chest: Effort normal and breath sounds normal. No respiratory distress. He has no wheezes. He has no rales.  Lymphadenopathy:    He has no cervical adenopathy.  Neurological: He is alert and oriented to person, place, and time. No cranial nerve deficit.  Skin: Skin is warm and dry.  Psychiatric: He has a normal mood and affect. His behavior is normal. Judgment and thought content normal.   Results for orders placed or performed during the hospital encounter of 07/01/16  Basic metabolic panel  Result Value Ref Range   Sodium 139 135 - 145 mmol/L   Potassium 4.0 3.5 - 5.1 mmol/L   Chloride 104 101 - 111 mmol/L   CO2 30 22 - 32 mmol/L   Glucose, Bld 112 (H) 65 - 99 mg/dL   BUN 10 6 - 20 mg/dL   Creatinine, Ser 1.29 (H) 0.61 - 1.24 mg/dL   Calcium 9.4 8.9 - 10.3 mg/dL   GFR calc non Af Amer >60 >60 mL/min   GFR calc Af Amer >60 >60 mL/min   Anion gap 5 5 - 15  CBC  Result Value Ref Range   WBC 9.7 3.8 - 10.6 K/uL   RBC 5.06 4.40 - 5.90 MIL/uL   Hemoglobin 15.2 13.0 - 18.0 g/dL   HCT 45.7 40.0 - 52.0 %   MCV 90.2 80.0 - 100.0 fL    MCH 30.0 26.0 - 34.0 pg   MCHC 33.3 32.0 - 36.0 g/dL   RDW 14.9 (H) 11.5 - 14.5 %   Platelets 206 150 - 440 K/uL  Troponin I  Result Value Ref Range   Troponin I <0.03 <0.03 ng/mL      Assessment & Plan:   Problem List Items Addressed This Visit      Other   Tobacco use disorder    Other Visit Diagnoses    Cough in adult    -  Primary      Meds ordered this encounter  Medications  . hydrOXYzine (ATARAX/VISTARIL) 25 MG tablet    Sig: Take 25 mg by mouth every 6 (six) hours as needed.   . benzonatate (TESSALON) 100 MG capsule    Sig: Take 1-2 pills every 12 hours as needed for cough.    Dispense:  40 capsule    Refill:  0    Order Specific Question:   Supervising Provider    Answer:   Olin Hauser [[id number]]    Discussion today >5 minutes (<10 minutes) specifically on counseling on risks of tobacco use, complications, treatment, smoking cessation.  Encouraged cutting back and reducing use of e-cigarettes also.  Discussed when ready to completely quit that we can assist with medications if needed.   Follow up plan: Return in about 1 month (around 08/15/2016)for annual physical or in 7-10 days if acute symptoms worsen or fail to improve.   Cassell Smiles, DNP, AGPCNP-BC Adult Gerontology Primary Care Nurse Practitioner [facility] 07/16/2016, 1:22 PM

## 2016-07-16 NOTE — Progress Notes (Signed)
I have reviewed this encounter including the documentation in this note and/or discussed this patient with the provider, Cassell Smiles, AGPCNP-BC. I am certifying that I agree with the content of this note as supervising physician.  Nobie Putnam, [facility] 07/16/2016, 2:59 PM

## 2016-07-16 NOTE — Patient Instructions (Signed)
Seth, Thank you for coming in to clinic today.  1. You likely have a respiratory viral infection. It sounds like you have a Upper Respiratory Virus - this will most likely run it's course in 7 to 10 days. Recommend good hand washing. - Start benzonatate 100 mg capsule.  Take 1-2 capsule every 12 hours as needed for cough. - Start anti-histamine loratadine 10mg  daily - If congestion is worse, start OTC Mucinex-DM or Robitussin-DM up to 7-10 days then stop - Drink plenty of fluids to improve congestion - You may try over the counter Nasal Saline spray (Simply Saline, Ocean Spray) as needed to reduce congestion. - Drink warm herbal tea with honey for sore throat. - Start taking Tylenol extra strength 1 to 2 tablets every 6-8 hours for aches or fever/chills for next few days as needed.  Do not take more than 3,000 mg in 24 hours from all medicines.  If symptoms significantly worsening with persistent fevers/chills despite tylenol/ibpurofen, nausea, vomiting unable to tolerate food/fluids or medicine, body aches, or shortness of breath, sinus pain pressure or worsening productive cough, then follow-up for re-evaluation, may seek more immediate care at Urgent Care or ED if more concerned for emergency.  Please schedule a follow-up appointment with Cassell Smiles, AGNP in 7-10 days.  If you have any other questions or concerns, please feel free to call the clinic or send a message through [place]. You may also schedule an earlier appointment if necessary.  Cassell Smiles, DNP, AGNP-BC Adult Gerontology Nurse Practitioner [facility], [facility]    Viral Respiratory Infection A respiratory infection is an illness that affects part of the respiratory system, such as the lungs, nose, or throat. Most respiratory infections are caused by either viruses or bacteria. A respiratory infection that is caused by a virus is called a viral respiratory infection. Common types of viral respiratory  infections include:  A cold.  The flu (influenza).  A respiratory syncytial virus (RSV) infection. How do I know if I have a viral respiratory infection? Most viral respiratory infections cause:  A stuffy or runny nose.  Yellow or green nasal discharge.  A cough.  Sneezing.  Fatigue.  Achy muscles.  A sore throat.  Sweating or chills.  A fever.  A headache. How are viral respiratory infections treated? If influenza is diagnosed early, it may be treated with an antiviral medicine that shortens the length of time a person has symptoms. Symptoms of viral respiratory infections may be treated with over-the-counter and prescription medicines, such as:  Expectorants. These make it easier to cough up mucus.  Decongestant nasal sprays. Health care providers do not prescribe antibiotic medicines for viral infections. This is because antibiotics are designed to kill bacteria. They have no effect on viruses. How do I know if I should stay home from work or school? To avoid exposing others to your respiratory infection, stay home if you have:  A fever.  A persistent cough.  A sore throat.  A runny nose.  Sneezing.  Muscles aches.  Headaches.  Fatigue.  Weakness.  Chills.  Sweating.  Nausea. Follow these instructions at home:  Rest as much as possible.  Take over-the-counter and prescription medicines only as told by your health care provider.  Drink enough fluid to keep your urine clear or pale yellow. This helps prevent dehydration and helps loosen up mucus.  Gargle with a salt-water mixture 3-4 times per day or as needed. To make a salt-water mixture, completely dissolve -1 tsp  of salt in 1 cup of warm water.  Use nose drops made from salt water to ease congestion and soften raw skin around your nose.  Do not drink alcohol.  Do not use tobacco products, including cigarettes, chewing tobacco, and e-cigarettes. If you need help quitting, ask your  health care provider. Contact a health care provider if:  Your symptoms last for 10 days or longer.  Your symptoms get worse over time.  You have a fever.  You have severe sinus pain in your face or forehead.  The glands in your jaw or neck become very swollen. Get help right away if:  You feel pain or pressure in your chest.  You have shortness of breath.  You faint or feel like you will faint.  You have severe and persistent vomiting.  You feel confused or disoriented. This information is not intended to replace advice given to you by your health care provider. Make sure you discuss any questions you have with your health care provider. Document Released: 12/16/2004 Document Revised: 08/14/2015 Document Reviewed: 08/14/2014 Elsevier Interactive Patient Education  2017 [employer].

## 2016-07-19 ENCOUNTER — Other Ambulatory Visit: Payer: Self-pay

## 2016-07-19 NOTE — Telephone Encounter (Signed)
Dr. Weber Cooks prescribes this medication.  Discussed with patient in the clinic to request his refill from the doctor that prescribes this medicine.

## 2016-07-19 NOTE — Telephone Encounter (Signed)
Last ov  07/16/16 Last filled 06/09/16 Please review. Thank you. sd

## 2016-07-21 ENCOUNTER — Other Ambulatory Visit: Payer: Self-pay | Admitting: Nurse Practitioner

## 2016-07-21 ENCOUNTER — Telehealth: Payer: Self-pay

## 2016-07-21 MED ORDER — DICYCLOMINE HCL 10 MG PO CAPS: 10.0000 mg | ORAL_CAPSULE | Freq: Three times a day (TID) | ORAL | 2 refills | Status: DC

## 2016-07-21 NOTE — Telephone Encounter (Signed)
Pharmacy requesting refill Last ov  07/16/16 Last filled ?

## 2016-08-03 ENCOUNTER — Telehealth: Payer: Self-pay | Admitting: Nurse Practitioner

## 2016-08-03 NOTE — Telephone Encounter (Signed)
Called pt to schedule Annual Wellness Visit with [place] for 5/22:  - knb

## 2016-08-06 ENCOUNTER — Telehealth: Payer: Self-pay | Admitting: Nurse Practitioner

## 2016-08-06 NOTE — Telephone Encounter (Signed)
Called pt to schedule Annual Wellness Visit with Nurse Health Advisor for 5/22:  - knb

## 2016-08-09 NOTE — Telephone Encounter (Signed)
Need to cancel this appt, called pt on 5/21 he already had CPE in January - knb

## 2016-08-12 ENCOUNTER — Ambulatory Visit: Payer: Self-pay | Admitting: Nurse Practitioner

## 2016-08-18 ENCOUNTER — Encounter: Payer: Self-pay | Admitting: Nurse Practitioner

## 2016-08-24 ENCOUNTER — Ambulatory Visit: Payer: Self-pay

## 2016-09-14 ENCOUNTER — Other Ambulatory Visit: Payer: Self-pay

## 2016-09-14 MED ORDER — PROMETHAZINE HCL 25 MG PO TABS: 25.0000 mg | ORAL_TABLET | Freq: Four times a day (QID) | ORAL | 0 refills | Status: DC | PRN

## 2016-09-23 ENCOUNTER — Ambulatory Visit: Payer: Self-pay | Admitting: Nurse Practitioner

## 2016-09-27 ENCOUNTER — Encounter: Payer: Self-pay | Admitting: Nurse Practitioner

## 2016-09-27 ENCOUNTER — Ambulatory Visit (INDEPENDENT_AMBULATORY_CARE_PROVIDER_SITE_OTHER): Payer: Medicare Other | Admitting: Nurse Practitioner

## 2016-09-27 DIAGNOSIS — R42 Dizziness and giddiness: Secondary | ICD-10-CM

## 2016-09-27 DIAGNOSIS — K219 Gastro-esophageal reflux disease without esophagitis: Secondary | ICD-10-CM | POA: Diagnosis not present

## 2016-09-27 DIAGNOSIS — R7989 Other specified abnormal findings of blood chemistry: Secondary | ICD-10-CM

## 2016-09-27 DIAGNOSIS — I10 Essential (primary) hypertension: Secondary | ICD-10-CM

## 2016-09-27 DIAGNOSIS — R21 Rash and other nonspecific skin eruption: Secondary | ICD-10-CM

## 2016-09-27 MED ORDER — METOPROLOL SUCCINATE ER 200 MG PO TB24: 200.0000 mg | ORAL_TABLET | Freq: Every day | ORAL | 3 refills | Status: DC

## 2016-09-27 MED ORDER — COLCHICINE 0.6 MG PO TABS: 0.6000 mg | ORAL_TABLET | Freq: Every day | ORAL | 0 refills | Status: DC | PRN

## 2016-09-27 MED ORDER — LOSARTAN POTASSIUM 100 MG PO TABS: 100.0000 mg | ORAL_TABLET | Freq: Every day | ORAL | 3 refills | Status: DC

## 2016-09-27 MED ORDER — OMEPRAZOLE 20 MG PO CPDR: 20.0000 mg | DELAYED_RELEASE_CAPSULE | Freq: Every day | ORAL | 3 refills | Status: DC

## 2016-09-27 MED ORDER — DICYCLOMINE HCL 10 MG PO CAPS: 10.0000 mg | ORAL_CAPSULE | Freq: Three times a day (TID) | ORAL | 2 refills | Status: DC

## 2016-09-27 MED ORDER — AMLODIPINE BESYLATE 5 MG PO TABS: 5.0000 mg | ORAL_TABLET | Freq: Every day | ORAL | 2 refills | Status: DC

## 2016-09-27 MED ORDER — PREDNISONE 20 MG PO TABS: ORAL_TABLET | ORAL | 0 refills | Status: DC

## 2016-09-27 MED ORDER — MECLIZINE HCL 25 MG PO TABS: 25.0000 mg | ORAL_TABLET | Freq: Three times a day (TID) | ORAL | 1 refills | Status: DC | PRN

## 2016-09-27 MED ORDER — HYDROXYZINE HCL 25 MG PO TABS: 25.0000 mg | ORAL_TABLET | Freq: Four times a day (QID) | ORAL | 5 refills | Status: DC | PRN

## 2016-09-27 NOTE — Patient Instructions (Addendum)
Demondre, Thank you for coming in to clinic today.  1. For your rash: - START your prednisone taper. - Use Over the counter hydrocortisone cream as needed.  2. For your elevated Creatinine - Kidney function: - RETURN to the clinic tomorrow morning or Wednesday for blood work.  3. For your blood pressure: - START amlodipine 5 mg once daily - Continue your other meds.  4. For your acid reflux - ONLY take omeprazole 20 mg once daily. - STOP the famotidine  Please schedule a follow-up appointment with Cassell Smiles, AGNP to Return in about 3 months (around 12/28/2016) for BP, GOUT, GERD follow up.  If you have any other questions or concerns, please feel free to call the clinic or send a message through [place]. You may also schedule an earlier appointment if necessary.  Cassell Smiles, DNP, AGNP-BC Adult Gerontology Nurse Practitioner [place] [facility]
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
## 2016-09-28 ENCOUNTER — Other Ambulatory Visit: Payer: Medicare Other

## 2016-09-29 LAB — COMPREHENSIVE METABOLIC PANEL
ALT: 36 U/L (ref 9–46)
AST: 24 U/L (ref 10–40)
Albumin: 4.4 g/dL (ref 3.6–5.1)
Alkaline Phosphatase: 61 U/L (ref 40–115)
BUN: 9 mg/dL (ref 7–25)
CO2: 25 mmol/L (ref 20–31)
Calcium: 9.7 mg/dL (ref 8.6–10.3)
Chloride: 102 mmol/L (ref 98–110)
Creat: 1.17 mg/dL (ref 0.60–1.35)
Glucose, Bld: 119 mg/dL — ABNORMAL HIGH (ref 65–99)
Potassium: 4.6 mmol/L (ref 3.5–5.3)
Sodium: 139 mmol/L (ref 135–146)
Total Bilirubin: 0.3 mg/dL (ref 0.2–1.2)
Total Protein: 7.4 g/dL (ref 6.1–8.1)

## 2016-09-30 NOTE — Progress Notes (Signed)
I have reviewed this encounter including the documentation in this note and/or discussed this patient with the provider, Cassell Smiles, AGPCNP-BC. I am certifying that I agree with the content of this note as supervising physician.  Nobie Putnam, [facility] 09/30/2016, 12:18 PM

## 2016-09-30 NOTE — Assessment & Plan Note (Addendum)
Poorly controlled today in clinic. BP well controlled when at [facility] ED w/ readings between 110-130/70s. Pt does not check BP at home.  Pt w/ 26 lbs weight gain since 07/01/16 which is likely contributing to poor BP control.  Plan: 1. Continue medicines as currently prescribed.  Pt w/ history of GOUT so will avoid thiazide diuretics. 2. Consider increase of amlodipine to 10 mg daily if BP remains elevated. 3. Start checking BP at home 1-2 times per week.  Keep a log and bring readings to clinic. 4. Encouraged heart healthy diet. Reviewed pt has to take ownership of taking all medications each day and for lifestyle modifications. 5. Follow up 3 months

## 2016-09-30 NOTE — Assessment & Plan Note (Signed)
Pt currently prescribed dual therapy w/ H2 blocker and PPI.  Well controlled on famotidine.  Pt states not taking omeprazole.  Plan: 1. STOP famotidine. 2. START omeprazole 20 mg once daily. 3. Follow up 3 months.

## 2016-10-01 ENCOUNTER — Other Ambulatory Visit: Payer: Self-pay

## 2016-11-02 ENCOUNTER — Emergency Department
Admission: EM | Admit: 2016-11-02 | Discharge: 2016-11-02 | Disposition: A | Discharge status: Home / Self Care | Payer: Medicare Other | Attending: Emergency Medicine | Admitting: Emergency Medicine

## 2016-11-02 ENCOUNTER — Encounter: Payer: Self-pay | Admitting: Emergency Medicine

## 2016-11-02 ENCOUNTER — Other Ambulatory Visit: Payer: Self-pay

## 2016-11-02 ENCOUNTER — Emergency Department: Payer: Medicare Other

## 2016-11-02 DIAGNOSIS — R079 Chest pain, unspecified: Secondary | ICD-10-CM | POA: Diagnosis not present

## 2016-11-02 DIAGNOSIS — F17210 Nicotine dependence, cigarettes, uncomplicated: Secondary | ICD-10-CM | POA: Insufficient documentation

## 2016-11-02 DIAGNOSIS — Z79899 Other long term (current) drug therapy: Secondary | ICD-10-CM | POA: Diagnosis not present

## 2016-11-02 DIAGNOSIS — I10 Essential (primary) hypertension: Secondary | ICD-10-CM | POA: Diagnosis not present

## 2016-11-02 DIAGNOSIS — E119 Type 2 diabetes mellitus without complications: Secondary | ICD-10-CM | POA: Diagnosis not present

## 2016-11-02 DIAGNOSIS — Z7982 Long term (current) use of aspirin: Secondary | ICD-10-CM | POA: Diagnosis not present

## 2016-11-02 DIAGNOSIS — N433 Hydrocele, unspecified: Secondary | ICD-10-CM | POA: Insufficient documentation

## 2016-11-02 DIAGNOSIS — N50819 Testicular pain, unspecified: Secondary | ICD-10-CM

## 2016-11-02 DIAGNOSIS — N50811 Right testicular pain: Secondary | ICD-10-CM | POA: Diagnosis present

## 2016-11-02 LAB — URINALYSIS, COMPLETE (UACMP) WITH MICROSCOPIC
BACTERIA UA: NONE SEEN
BILIRUBIN URINE: NEGATIVE
Glucose, UA: NEGATIVE mg/dL
Hgb urine dipstick: NEGATIVE
KETONES UR: NEGATIVE mg/dL
LEUKOCYTES UA: NEGATIVE
NITRITE: NEGATIVE
Protein, ur: NEGATIVE mg/dL
RBC / HPF: NONE SEEN RBC/hpf (ref 0–5)
Specific Gravity, Urine: 1.002 — ABNORMAL LOW (ref 1.005–1.030)
WBC UA: NONE SEEN WBC/hpf (ref 0–5)
pH: 6 (ref 5.0–8.0)

## 2016-11-02 LAB — COMPREHENSIVE METABOLIC PANEL
ALBUMIN: 4.2 g/dL (ref 3.5–5.0)
ALK PHOS: 50 U/L (ref 38–126)
ALT: 47 U/L (ref 17–63)
ANION GAP: 10 (ref 5–15)
AST: 37 U/L (ref 15–41)
BUN: 11 mg/dL (ref 6–20)
CALCIUM: 9.3 mg/dL (ref 8.9–10.3)
CHLORIDE: 107 mmol/L (ref 101–111)
CO2: 22 mmol/L (ref 22–32)
Creatinine, Ser: 1.16 mg/dL (ref 0.61–1.24)
GFR calc non Af Amer: >60 mL/min (ref >60)
GLUCOSE: 131 mg/dL — AB (ref 65–99)
Potassium: 3.6 mmol/L (ref 3.5–5.1)
SODIUM: 139 mmol/L (ref 135–145)
Total Bilirubin: 0.5 mg/dL (ref 0.3–1.2)
Total Protein: 7.7 g/dL (ref 6.5–8.1)

## 2016-11-02 LAB — CBC WITH DIFFERENTIAL/PLATELET
Basophils Absolute: 0.1 10*3/uL (ref 0–0.1)
Basophils Relative: 1 %
EOS ABS: 0.4 10*3/uL (ref 0–0.7)
EOS PCT: 3 %
HCT: 45.2 % (ref 40.0–52.0)
HEMOGLOBIN: 15.5 g/dL (ref 13.0–18.0)
LYMPHS ABS: 3.6 10*3/uL (ref 1.0–3.6)
LYMPHS PCT: 31 %
MCH: 31.3 pg (ref 26.0–34.0)
MCHC: 34.3 g/dL (ref 32.0–36.0)
MCV: 91.2 fL (ref 80.0–100.0)
MONOS PCT: 9 %
Monocytes Absolute: 1.1 10*3/uL — ABNORMAL HIGH (ref 0.2–1.0)
NEUTROS ABS: 6.6 10*3/uL — AB (ref 1.4–6.5)
NEUTROS PCT: 56 %
PLATELETS: 211 10*3/uL (ref 150–440)
RBC: 4.96 MIL/uL (ref 4.40–5.90)
RDW: 14.6 % — ABNORMAL HIGH (ref 11.5–14.5)
WBC: 11.9 10*3/uL — ABNORMAL HIGH (ref 3.8–10.6)

## 2016-11-02 LAB — TROPONIN I: Troponin I: <0.03 ng/mL (ref <0.03)

## 2016-11-02 MED ORDER — LEVOFLOXACIN 500 MG PO TABS: 500.0000 mg | ORAL_TABLET | Freq: Every day | ORAL | 0 refills | Status: AC

## 2016-11-02 NOTE — ED Provider Notes (Signed)
[facility] Emergency Department Provider Note   First MD Initiated Contact with Patient 11/02/16 [phone]     (approximate)  I have reviewed the triage vital signs and the nursing notes.   HISTORY  Chief Complaint Chest Pain and Groin Swelling    HPI Arvel Oquinn is a 41 y.o. male with bolus of chronic medical conditions presents to the emergency department with "swelling in my testicles again". Patient states that he's had bilateral testicular discomfort and swelling 4 days. Patient denies any dysuria patient denies any fever. Patient also states "yeah my chest has been hurting too but has been for a long time". Patient denies any shortness of breath no lower stomach pain or swelling. Patient states current pain score is 5 out of 10.   Past Medical History:  Diagnosis Date  . Alcohol abuse   . Bronchitis   . Depression   . Diabetes mellitus without complication ([place])   . GERD (gastroesophageal reflux disease)   . Gout   . Hypertension   . Morbid obesity ([place])   . Suicide ideation     Patient Active Problem List   Diagnosis Date Noted  . Tachycardia 04/19/2016  . Factitious disorder 01/13/2016  . Unspecified depressive disorder 01/13/2016  . Alcohol use disorder, severe, dependence ([place]) 01/13/2016  . 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 HTN (hypertension) 01/05/2016  . Gout 01/05/2016  . GERD (gastroesophageal reflux disease) 01/05/2016  . Diabetes ([place]) 01/05/2016  . Tobacco use disorder 01/05/2016  . Obesity 10/27/2015    Past Surgical History:  Procedure Laterality Date  . none      Prior to Admission medications   Medication Sig Start Date End Date Taking? Authorizing Provider  allopurinol (ZYLOPRIM) 300 MG tablet Take 300 mg by mouth daily.    [provider]  amLODipine (NORVASC) 5 MG tablet Take 1 tablet (5 mg total) by mouth daily. 09/27/16   Mikey College, NP  aspirin EC 81 MG tablet Take 81 mg by mouth daily.     [provider]  B Complex-C (B-COMPLEX WITH VITAMIN C) tablet Take 1 tablet by mouth daily.    [provider]  colchicine 0.6 MG tablet Take 1 tablet (0.6 mg total) by mouth daily as needed. 09/27/16   Mikey College, NP  dicyclomine (BENTYL) 10 MG capsule Take 1 capsule (10 mg total) by mouth 3 (three) times daily before meals. 09/27/16   Mikey College, NP  famotidine (PEPCID) 40 MG tablet TAKE 1 TABLET BY MOUTH BEDTIME FOR STOMACH ACID 04/22/16   Arlis Porta., MD  hydrOXYzine (ATARAX/VISTARIL) 25 MG tablet Take 1 tablet (25 mg total) by mouth every 6 (six) hours as needed. 09/27/16   Mikey College, NP  losartan (COZAAR) 100 MG tablet Take 1 tablet (100 mg total) by mouth daily. 09/27/16   Mikey College, NP  meclizine (ANTIVERT) 25 MG tablet Take 1 tablet (25 mg total) by mouth 3 (three) times daily as needed for dizziness or nausea. 09/27/16   Mikey College, NP  meloxicam (MOBIC) 7.5 MG tablet Take 7.5 mg by mouth daily.    [provider]  metoprolol (TOPROL-XL) 200 MG 24 hr tablet Take 1 tablet (200 mg total) by mouth daily. Take with or immediately following a meal. 09/27/16   Mikey College, NP  Multiple Vitamin (MULTIVITAMIN WITH MINERALS) TABS tablet Take 1 tablet by mouth daily.    [provider]  omeprazole (PRILOSEC) 20 MG  capsule Take 1 capsule (20 mg total) by mouth daily. 09/27/16   Mikey College, NP  predniSONE (DELTASONE) 20 MG tablet 4 days take 3 pills. For 4 days take 2 pills, for 4 days take 1 pill, then 2 days, take 1/2 pill. 09/27/16   Mikey College, NP  promethazine (PHENERGAN) 25 MG tablet Take 1 tablet (25 mg total) by mouth every 6 (six) hours as needed for nausea or vomiting. 09/14/16   Mikey College, NP  traZODone (DESYREL) 100 MG tablet  07/24/16   [provider]  VENTOLIN HFA 108 (90 Base) MCG/ACT inhaler INHALE 2 PUFFS BY MOUTH EVERY 4 TO 6 HOURS AS NEEDED.  11/18/15   Arlis Porta., MD    Allergies Bee venom and Bactrim [sulfamethoxazole-trimethoprim]  Family History  Problem Relation Age of Onset  . Hypertension Unknown   . Diabetes Mellitus II Unknown     Social History Social History  Substance Use Topics  . Smoking status: Current Every Day Smoker    Packs/day: 1.00    Types: Cigarettes  . Smokeless tobacco: Never Used  . Alcohol use No     Comment: occasional    Review of Systems Constitutional: No fever/chills Eyes: No visual changes. ENT: No sore throat. Cardiovascular: Denies chest pain. Respiratory: Denies shortness of breath. Gastrointestinal: No abdominal pain.  No nausea, no vomiting.  No diarrhea.  No constipation. Genitourinary: Negative for dysuria.Positive for testicular pain and swelling Musculoskeletal: Negative for neck pain.  Negative for back pain. Integumentary: Negative for rash. Neurological: Negative for headaches, focal weakness or numbness.   ____________________________________________   PHYSICAL EXAM:  VITAL SIGNS: ED Triage Vitals  Enc Vitals Group     BP 11/02/16 0313 115/61     Pulse Rate 11/02/16 0313 80     Resp 11/02/16 0313 18     Temp 11/02/16 0313 98 F (36.7 C)     Temp Source 11/02/16 0313 Oral     SpO2 11/02/16 0313 94 %     Weight 11/02/16 0314 113.4 kg (250 lb)     Height 11/02/16 0314 1.854 m (6\' 1" )     Head Circumference --      Peak Flow --      Pain Score 11/02/16 0313 10     Pain Loc --      Pain Edu? --      Excl. in [place]? --     Constitutional: Alert and oriented. Well appearing and in no acute distress. Eyes: Conjunctivae are normal.  Head: Atraumatic. Mouth/Throat: Mucous membranes are moist. Neck: No stridor.  No meningeal signs.   Cardiovascular: Normal rate, regular rhythm. Good peripheral circulation. Grossly normal heart sounds. Respiratory: Normal respiratory effort.  No retractions. Lungs CTAB. Gastrointestinal: Soft and nontender. No  distention.  Genitourinary: No gross abnormality noted no tenderness palpation. Musculoskeletal: No lower extremity tenderness nor edema. No gross deformities of extremities. Neurologic:  Normal speech and language. No gross focal neurologic deficits are appreciated.  Skin:  Skin is warm, dry and intact. No rash noted. Psychiatric: Mood and affect are normal. Speech and behavior are normal.  ____________________________________________   LABS (all labs ordered are listed, but only abnormal results are displayed)  Labs Reviewed  CBC WITH DIFFERENTIAL/PLATELET - Abnormal; Notable for the following:       Result Value   WBC 11.9 (*)    RDW 14.6 (*)    Neutro Abs 6.6 (*)    Monocytes Absolute 1.1 (*)  All other components within normal limits  COMPREHENSIVE METABOLIC PANEL - Abnormal; Notable for the following:    Glucose, Bld 131 (*)    All other components within normal limits  URINALYSIS, COMPLETE (UACMP) WITH MICROSCOPIC - Abnormal; Notable for the following:    Color, Urine STRAW (*)    APPearance CLEAR (*)    Specific Gravity, Urine 1.002 (*)    Squamous Epithelial / LPF 0-5 (*)    All other components within normal limits  TROPONIN I   ____________________________________________  EKG  ED ECG REPORT I, [place] N Kaoru Benda, the attending physician, personally viewed and interpreted this ECG.   Date: 11/02/2016  EKG Time: 3:09 AM  Rate: 78  Rhythm: Normal sinus rhythm  Axis: Normal  Intervals: Normal  ST&T Change: None  ____________________________________________  RADIOLOGY I, [place] N Neftaly Inzunza, personally viewed and evaluated these images (plain radiographs) as part of my medical decision making, as well as reviewing the written report by the radiologist.  Dg Chest 2 View  Result Date: 11/02/2016 CLINICAL DATA:  Acute onset of midsternal chest pain and testicular swelling. Shortness of breath and dysuria. Initial encounter. EXAM: CHEST  2 VIEW COMPARISON:  Chest  radiograph and CT of the chest performed 09/23/2016 FINDINGS: The lungs are well-aerated and clear. There is no evidence of focal opacification, pleural effusion or pneumothorax. The heart is borderline normal in size. No acute osseous abnormalities are seen. IMPRESSION: No acute cardiopulmonary process seen. Electronically Signed   By: Garald Balding M.D.   On: 11/02/2016 04:14   US Scrotum  Result Date: 11/02/2016 CLINICAL DATA:  Acute onset of bilateral testicular pain and swelling. Initial encounter. EXAM: SCROTAL ULTRASOUND DOPPLER ULTRASOUND OF THE TESTICLES TECHNIQUE: Complete ultrasound examination of the testicles, epididymis, and other scrotal structures was performed. Color and spectral Doppler ultrasound were also utilized to evaluate blood flow to the testicles. COMPARISON:  Scrotal ultrasound performed 08/10/2015 FINDINGS: Right testicle Measurements: 4.7 x 2.8 x 2.9 cm. No mass or microlithiasis visualized. Left testicle Measurements: 4.9 x 2.9 x 3.1 cm. No mass or microlithiasis visualized. Right epididymis:  Normal in size and appearance. Left epididymis:  Normal in size and appearance. Hydrocele:  A small mildly complex left-sided hydrocele is noted. Varicocele:  None visualized. Pulsed Doppler interrogation of both testes demonstrates normal low resistance arterial and venous waveforms bilaterally. IMPRESSION: 1. No evidence of testicular torsion. 2. Small mildly complex left-sided hydrocele noted. This may reflect underlying infection. Electronically Signed   By: Garald Balding M.D.   On: 11/02/2016 04:54   [place] Art/ven Flow Abd Pelv Doppler  Result Date: 11/02/2016 CLINICAL DATA:  Acute onset of bilateral testicular pain and swelling. Initial encounter. EXAM: SCROTAL ULTRASOUND DOPPLER ULTRASOUND OF THE TESTICLES TECHNIQUE: Complete ultrasound examination of the testicles, epididymis, and other scrotal structures was performed. Color and spectral Doppler ultrasound were also utilized to  evaluate blood flow to the testicles. COMPARISON:  Scrotal ultrasound performed 08/10/2015 FINDINGS: Right testicle Measurements: 4.7 x 2.8 x 2.9 cm. No mass or microlithiasis visualized. Left testicle Measurements: 4.9 x 2.9 x 3.1 cm. No mass or microlithiasis visualized. Right epididymis:  Normal in size and appearance. Left epididymis:  Normal in size and appearance. Hydrocele:  A small mildly complex left-sided hydrocele is noted. Varicocele:  None visualized. Pulsed Doppler interrogation of both testes demonstrates normal low resistance arterial and venous waveforms bilaterally. IMPRESSION: 1. No evidence of testicular torsion. 2. Small mildly complex left-sided hydrocele noted. This may reflect underlying infection. Electronically Signed  By: Garald Balding M.D.   On: 11/02/2016 04:54    ____________________________________________   Procedures   ____________________________________________   INITIAL IMPRESSION / ASSESSMENT AND PLAN / ED COURSE  Pertinent labs & imaging results that were available during my care of the patient were reviewed by me and considered in my medical decision making (see chart for details).  41 year old male presenting with testicular discomfort ultrasound was revealed above-stated finding of a small mildly complex left-sided hydrocele with concern for possible underlying infection. As such patient given Levaquin 500 mg IV daily 7 days. In addition patient admits to chest pain EKG revealed no evidence of ischemia or infarction troponin negative      ____________________________________________  FINAL CLINICAL IMPRESSION(S) / ED DIAGNOSES  Final diagnoses:  Right hydrocele  Chest pain   MEDICATIONS GIVEN DURING THIS VISIT:  Medications - No data to display   NEW OUTPATIENT MEDICATIONS STARTED DURING THIS VISIT:  New Prescriptions   No medications on file    Modified Medications   No medications on file    Discontinued Medications   No  medications on file     Note:  This document was prepared using Dragon voice recognition software and may include unintentional dictation errors.    Gregor Hams, MD 11/02/16 2232

## 2016-11-02 NOTE — ED Triage Notes (Addendum)
Pt presents to ED with swelling to his "testicles again" and midsternal chest pain for the past 4 days. reports sob and pain with urination. Pt alert and talkative at this time.  No obvious distress noted.

## 2016-11-11 ENCOUNTER — Emergency Department
Admission: EM | Admit: 2016-11-11 | Discharge: 2016-11-11 | Disposition: A | Discharge status: Home / Self Care | Payer: Medicare Other | Attending: Emergency Medicine | Admitting: Emergency Medicine

## 2016-11-11 ENCOUNTER — Other Ambulatory Visit: Payer: Self-pay

## 2016-11-11 DIAGNOSIS — F1010 Alcohol abuse, uncomplicated: Secondary | ICD-10-CM

## 2016-11-11 DIAGNOSIS — E119 Type 2 diabetes mellitus without complications: Secondary | ICD-10-CM | POA: Diagnosis not present

## 2016-11-11 DIAGNOSIS — Z791 Long term (current) use of non-steroidal anti-inflammatories (NSAID): Secondary | ICD-10-CM | POA: Diagnosis not present

## 2016-11-11 DIAGNOSIS — F10120 Alcohol abuse with intoxication, uncomplicated: Secondary | ICD-10-CM | POA: Diagnosis not present

## 2016-11-11 DIAGNOSIS — Z7982 Long term (current) use of aspirin: Secondary | ICD-10-CM | POA: Insufficient documentation

## 2016-11-11 DIAGNOSIS — Z79899 Other long term (current) drug therapy: Secondary | ICD-10-CM | POA: Diagnosis not present

## 2016-11-11 DIAGNOSIS — T50901A Poisoning by unspecified drugs, medicaments and biological substances, accidental (unintentional), initial encounter: Secondary | ICD-10-CM

## 2016-11-11 DIAGNOSIS — Y906 Blood alcohol level of 120-199 mg/100 ml: Secondary | ICD-10-CM | POA: Insufficient documentation

## 2016-11-11 DIAGNOSIS — I10 Essential (primary) hypertension: Secondary | ICD-10-CM | POA: Diagnosis not present

## 2016-11-11 DIAGNOSIS — F17210 Nicotine dependence, cigarettes, uncomplicated: Secondary | ICD-10-CM | POA: Diagnosis not present

## 2016-11-11 DIAGNOSIS — F10920 Alcohol use, unspecified with intoxication, uncomplicated: Secondary | ICD-10-CM

## 2016-11-11 LAB — COMPREHENSIVE METABOLIC PANEL
ALBUMIN: 4.1 g/dL (ref 3.5–5.0)
ALT: 36 U/L (ref 17–63)
AST: 30 U/L (ref 15–41)
Alkaline Phosphatase: 46 U/L (ref 38–126)
Anion gap: 9 (ref 5–15)
BUN: 8 mg/dL (ref 6–20)
CHLORIDE: 106 mmol/L (ref 101–111)
CO2: 22 mmol/L (ref 22–32)
CREATININE: 1.38 mg/dL — AB (ref 0.61–1.24)
Calcium: 8.9 mg/dL (ref 8.9–10.3)
GFR calc Af Amer: >60 mL/min (ref >60)
GFR calc non Af Amer: >60 mL/min (ref >60)
Glucose, Bld: 94 mg/dL (ref 65–99)
POTASSIUM: 3.6 mmol/L (ref 3.5–5.1)
SODIUM: 137 mmol/L (ref 135–145)
Total Bilirubin: 0.5 mg/dL (ref 0.3–1.2)
Total Protein: 7.5 g/dL (ref 6.5–8.1)

## 2016-11-11 LAB — URINALYSIS, COMPLETE (UACMP) WITH MICROSCOPIC
Bilirubin Urine: NEGATIVE
Glucose, UA: NEGATIVE mg/dL
Hgb urine dipstick: NEGATIVE
KETONES UR: NEGATIVE mg/dL
LEUKOCYTES UA: NEGATIVE
Nitrite: NEGATIVE
PROTEIN: NEGATIVE mg/dL
RBC / HPF: NONE SEEN RBC/hpf (ref 0–5)
Specific Gravity, Urine: 1.003 — ABNORMAL LOW (ref 1.005–1.030)
pH: 5 (ref 5.0–8.0)

## 2016-11-11 LAB — URINE DRUG SCREEN, QUALITATIVE (ARMC ONLY)
AMPHETAMINES, UR SCREEN: NOT DETECTED
BARBITURATES, UR SCREEN: NOT DETECTED
BENZODIAZEPINE, UR SCRN: NOT DETECTED
COCAINE METABOLITE, UR ~~LOC~~: NOT DETECTED
Cannabinoid 50 Ng, Ur ~~LOC~~: NOT DETECTED
MDMA (Ecstasy)Ur Screen: NOT DETECTED
METHADONE SCREEN, URINE: NOT DETECTED
OPIATE, UR SCREEN: NOT DETECTED
PHENCYCLIDINE (PCP) UR S: NOT DETECTED
Tricyclic, Ur Screen: NOT DETECTED

## 2016-11-11 LAB — CBC
HEMATOCRIT: 43.3 % (ref 40.0–52.0)
HEMOGLOBIN: 14.7 g/dL (ref 13.0–18.0)
MCH: 30.8 pg (ref 26.0–34.0)
MCHC: 34 g/dL (ref 32.0–36.0)
MCV: 90.7 fL (ref 80.0–100.0)
Platelets: 220 10*3/uL (ref 150–440)
RBC: 4.78 MIL/uL (ref 4.40–5.90)
RDW: 14.8 % — AB (ref 11.5–14.5)
WBC: 12 10*3/uL — ABNORMAL HIGH (ref 3.8–10.6)

## 2016-11-11 LAB — ETHANOL: ALCOHOL ETHYL (B): 194 mg/dL — AB (ref <5)

## 2016-11-11 LAB — SALICYLATE LEVEL

## 2016-11-11 LAB — ACETAMINOPHEN LEVEL: Acetaminophen (Tylenol), Serum: <10 ug/mL — ABNORMAL LOW (ref 10–30)

## 2016-11-11 NOTE — BH Assessment (Signed)
Clinician consulted with EDP Dr.Veronese. Per EDP pt will be held in hopes of [facility] placement. Pt will be provided with additional community resources if placement if not found.

## 2016-11-11 NOTE — ED Notes (Signed)

## 2016-11-11 NOTE — ED Notes (Signed)
BEHAVIORAL HEALTH ROUNDING Patient sleeping: No. Patient alert and oriented: yes Behavior appropriate: Yes.  ; If no, describe:  Nutrition and fluids offered: yes Toileting and hygiene offered: Yes  Sitter present: q15 minute observations and security  monitoring Law enforcement present: Yes  ODS  

## 2016-11-11 NOTE — ED Notes (Signed)
Pt password not given to visitor/family. Pt requesting no phone calls/visits at this time. Family member instructed to call prior to visit/request to speak to pt and it is pt discretion to accept calls/visits. Verbalizes understanding.

## 2016-11-11 NOTE — ED Notes (Signed)
BEHAVIORAL HEALTH ROUNDING Patient sleeping: No. Patient alert and oriented: yes Behavior appropriate: Yes.  ; If no, describe:  Nutrition and fluids offered: yes Toileting and hygiene offered: Yes  Sitter present: q15 minute observations and security monitoring Law enforcement present: Yes  ODS  He continues to await TTS to arrive with referral for detox placement

## 2016-11-11 NOTE — ED Notes (Signed)
BEHAVIORAL HEALTH ROUNDING Patient sleeping: Yes.   Patient alert and oriented: eyes closed  Appears to be asleep Behavior appropriate: Yes.  ; If no, describe:  Nutrition and fluids offered: Yes  Toileting and hygiene offered: sleeping Sitter present: q 15 minute observations and security monitoring Law enforcement present: yes  ODS 

## 2016-11-11 NOTE — ED Provider Notes (Signed)
-----------------------------------------   7:36 AM on 11/11/2016 -----------------------------------------   Blood pressure 108/84, pulse 79, temperature 98 F (36.7 C), temperature source Oral, resp. rate 16, height 6\' 1"  (1.854 m), weight 113.4 kg (250 lb), SpO2 97 %.  Assuming care from Dr. Beather Arbour of [facility] is a 41 y.o. male with a chief complaint of Alcohol Intoxication .    Please refer to H&P by previous MD for further details.  The current plan of care is to monitor for total of 6 hours. Patient would be medically cleared at 11:30AM. TTS has been consulted to help arrange outpatient detox.  _________________________ 3:55 PM on 11/11/2016 -----------------------------------------  Patient has been here for 10 hours with no signs or symptoms of overdose. Patient awaiting acceptance to inpatient detox facility however facilty refused since according to them patient does not meet criteria for inpatient. Patient given resources for outpatient help. No signs or symptoms or withdrawal. Return precautions discussed with patient. Will dc home          [place], [place], MD 11/11/16 [phone]

## 2016-11-11 NOTE — ED Notes (Signed)
Only one detox facility contacted for treatment and they declined him due to not meeting criteria  Pt questions why no other place has been contacted     I gave him information about community resources for alcohol detox including [facility] for detox - [facility] due to he has been a pt there before

## 2016-11-11 NOTE — Discharge Instructions (Signed)
You have been seen in the Emergency Department (ED) today for alcohol intoxication.  As we have discussed, please do NOT drink alcohol in excess as alcohol intoxication can be life threatening. NEVER drive or operate machinery after drinking alcohol.    Do NOT drive today.  Drink plenty of water or other clear liquids (Gatorade, Powerade, etc) for the next 24 hours to help your body recover and stay hydrated.  Follow-up care is a key part of your treatment and safety. Follow up with your doctor in the next 2-5 days. If you need help to stop drinking please call [facility] at [phone]  How can you care for yourself at home?  Be safe with medicines. Take your medicines exactly as prescribed. Call your doctor if you think you are having a problem with your medicine.  Your doctor may have prescribed disulfiram (Antabuse). Do not drink any alcohol while you are taking this medicine. You may have severe or even life-threatening side effects from even small amounts of alcohol.  If you were given medicine to prevent nausea, be sure to take it exactly as prescribed.  Before you take any medicine, tell your doctor if:  You have had a bad reaction to any medicines in the past.  You are taking other medicines, including over-the-counter ones, or have other health problems.  You are or could be pregnant. Be prepared to have some symptoms of withdrawal in the next few days which includes agitation, tremors, anxiety, sweating, headache, nausea, vomiting. Drink plenty of liquids in the next few days to prevent dehydration. Seek help if you need it to stop drinking. Getting counseling and joining a support group can help you stay sober. Try a support group such as Alcoholics Anonymous.  Avoid alcohol when you take medicines. It can react with many medicines and cause serious problems.  When should you call for help?  Call 911 anytime you think you may need emergency care. For example, call if:  You feel confused  and are seeing things that are not there.  You are thinking about killing yourself or hurting others.  You have a seizure.  You vomit blood or what looks like coffee grounds.  Call your doctor now or seek immediate medical care if:  You have trembling, restlessness, sweating, and other withdrawal symptoms that are new or that get worse.  Your withdrawal symptoms come back after not bothering you for days or weeks.  You can't stop vomiting.  Watch closely for changes in your health, and be sure to contact your doctor if:  You need help to stop drinking.

## 2016-11-11 NOTE — ED Notes (Signed)
ED  Is the patient under IVC or is there intent for IVC:  no Is the patient medically cleared: Yes.   Is there vacancy in the ED BHU: Yes.   Is the population mix appropriate for patient: Yes.   Is the patient awaiting placement in inpatient or outpatient setting:  Has the patient had a psychiatric consult: no - not ordered  pt requesting detox  TTS to be looking for a bed .   Survey of unit performed for contraband, proper placement and condition of furniture, tampering with fixtures in bathroom, shower, and each patient room: Yes.  ; Findings:  APPEARANCE/BEHAVIOR Calm and cooperative NEURO ASSESSMENT Orientation: oriented x4  Denies pain Hallucinations: No.None noted (Hallucinations) Speech: Normal Gait: normal RESPIRATORY ASSESSMENT Even  Unlabored respirations  CARDIOVASCULAR ASSESSMENT Pulses equal   regular rate  Skin warm and dry   GASTROINTESTINAL ASSESSMENT no GI complaint EXTREMITIES Full ROM  PLAN OF CARE Provide calm/safe environment. Vital signs assessed twice daily. ED BHU Assessment once each 12-hour shift. Collaborate with TTS daily or as condition indicates. Assure the ED provider has rounded once each shift. Provide and encourage hygiene. Provide redirection as needed. Assess for escalating behavior; address immediately and inform ED provider.  Assess family dynamic and appropriateness for visitation as needed: Yes.  ; If necessary, describe findings:  Educate the patient/family about BHU procedures/visitation: Yes.  ; If necessary, describe findings:

## 2016-11-11 NOTE — BH Assessment (Signed)
Per [facility], pt does not meet detox criteria. EDP Dr.Veronese informed.Marland Kitchen

## 2016-11-11 NOTE — ED Notes (Signed)
Report to amy, rn

## 2016-11-11 NOTE — ED Notes (Signed)
Pt given warm blanket. RN informed no breakfast brought for pt.

## 2016-11-11 NOTE — BH Assessment (Signed)
Pt detox referral submitted to [facility].

## 2016-11-11 NOTE — ED Provider Notes (Signed)
[facility] Emergency Department Provider Note   ____________________________________________   First MD Initiated Contact with Patient 11/11/16 [phone]     (approximate)  I have reviewed the triage vital signs and the nursing notes.   HISTORY  Chief Complaint Alcohol Intoxication    HPI Alan Eaton is a 41 y.o. male who presents to the ED from home via EMS with a chief complaint of alcohol detox. Patient reports he is not a daily drinker but pinched 6 beers tonight and is requesting detox. Reports taking 6 hydroxyzine because he is unhappy in his current living situation but denies active SI/HI/AH/VH. Voice no medical complaints.Endorses history of DTs.   Past Medical History:  Diagnosis Date  . Alcohol abuse   . Bronchitis   . Depression   . Diabetes mellitus without complication ([place])   . GERD (gastroesophageal reflux disease)   . Gout   . Hypertension   . Morbid obesity ([place])   . Suicide ideation     Patient Active Problem List   Diagnosis Date Noted  . Tachycardia 04/19/2016  . Factitious disorder 01/13/2016  . Unspecified depressive disorder 01/13/2016  . Alcohol use disorder, severe, dependence ([place]) 01/13/2016  . 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 HTN (hypertension) 01/05/2016  . Gout 01/05/2016  . GERD (gastroesophageal reflux disease) 01/05/2016  . Diabetes ([place]) 01/05/2016  . Tobacco use disorder 01/05/2016  . Obesity 10/27/2015    Past Surgical History:  Procedure Laterality Date  . none      Prior to Admission medications   Medication Sig Start Date End Date Taking? Authorizing Provider  levofloxacin (LEVAQUIN) 500 MG tablet Take 500 mg by mouth daily.   Yes [provider]  allopurinol (ZYLOPRIM) 300 MG tablet Take 300 mg by mouth daily.    [provider]  amLODipine (NORVASC) 5 MG tablet Take 1 tablet (5 mg total) by mouth daily. 09/27/16   Mikey College, NP  aspirin EC 81 MG tablet Take  81 mg by mouth daily.    [provider]  B Complex-C (B-COMPLEX WITH VITAMIN C) tablet Take 1 tablet by mouth daily.    [provider]  colchicine 0.6 MG tablet Take 1 tablet (0.6 mg total) by mouth daily as needed. 09/27/16   Mikey College, NP  dicyclomine (BENTYL) 10 MG capsule Take 1 capsule (10 mg total) by mouth 3 (three) times daily before meals. 09/27/16   Mikey College, NP  famotidine (PEPCID) 40 MG tablet TAKE 1 TABLET BY MOUTH BEDTIME FOR STOMACH ACID 04/22/16   Arlis Porta., MD  hydrOXYzine (ATARAX/VISTARIL) 25 MG tablet Take 1 tablet (25 mg total) by mouth every 6 (six) hours as needed. 09/27/16   Mikey College, NP  losartan (COZAAR) 100 MG tablet Take 1 tablet (100 mg total) by mouth daily. 09/27/16   Mikey College, NP  meclizine (ANTIVERT) 25 MG tablet Take 1 tablet (25 mg total) by mouth 3 (three) times daily as needed for dizziness or nausea. 09/27/16   Mikey College, NP  meloxicam (MOBIC) 7.5 MG tablet Take 7.5 mg by mouth daily.    [provider]  metoprolol (TOPROL-XL) 200 MG 24 hr tablet Take 1 tablet (200 mg total) by mouth daily. Take with or immediately following a meal. 09/27/16   Mikey College, NP  Multiple Vitamin (MULTIVITAMIN WITH MINERALS) TABS tablet Take 1 tablet by mouth daily.    [provider]  omeprazole (PRILOSEC) 20 MG capsule  Take 1 capsule (20 mg total) by mouth daily. 09/27/16   Mikey College, NP  predniSONE (DELTASONE) 20 MG tablet 4 days take 3 pills. For 4 days take 2 pills, for 4 days take 1 pill, then 2 days, take 1/2 pill. 09/27/16   Mikey College, NP  promethazine (PHENERGAN) 25 MG tablet Take 1 tablet (25 mg total) by mouth every 6 (six) hours as needed for nausea or vomiting. 09/14/16   Mikey College, NP  traZODone (DESYREL) 100 MG tablet  07/24/16   [provider]  VENTOLIN HFA 108 (90 Base) MCG/ACT inhaler INHALE 2 PUFFS BY MOUTH EVERY 4 TO 6  HOURS AS NEEDED. 11/18/15   Arlis Porta., MD    Allergies Bee venom and Bactrim [sulfamethoxazole-trimethoprim]  Family History  Problem Relation Age of Onset  . Hypertension Unknown   . Diabetes Mellitus II Unknown     Social History Social History  Substance Use Topics  . Smoking status: Current Every Day Smoker    Packs/day: 1.00    Types: Cigarettes  . Smokeless tobacco: Never Used  . Alcohol use No     Comment: occasional    Review of Systems  Constitutional: No fever/chills. Eyes: No visual changes. ENT: No sore throat. Cardiovascular: Denies chest pain. Respiratory: Denies shortness of breath. Gastrointestinal: No abdominal pain.  No nausea, no vomiting.  No diarrhea.  No constipation. Genitourinary: Negative for dysuria. Musculoskeletal: Negative for back pain. Skin: Negative for rash. Neurological: Negative for headaches, focal weakness or numbness. Psychiatric:Positive for alcoholism.   ____________________________________________   PHYSICAL EXAM:  VITAL SIGNS: ED Triage Vitals  Enc Vitals Group     BP 11/11/16 0533 108/84     Pulse Rate 11/11/16 0533 79     Resp 11/11/16 0533 16     Temp 11/11/16 0533 98 F (36.7 C)     Temp Source 11/11/16 0533 Oral     SpO2 11/11/16 0533 97 %     Weight 11/11/16 0534 250 lb (113.4 kg)     Height 11/11/16 0534 6\' 1"  (1.854 m)     Head Circumference --      Peak Flow --      Pain Score 11/11/16 0532 0     Pain Loc --      Pain Edu? --      Excl. in [place]? --     Constitutional: Alert and oriented. Well appearing and in no acute distress. Intoxicated, jovial. Eyes: Conjunctivae are normal. PERRL. EOMI. Head: Atraumatic. Nose: No congestion/rhinnorhea. Mouth/Throat: Edentulous. Mucous membranes are moist.  Oropharynx non-erythematous. Neck: No stridor.   Cardiovascular: Normal rate, regular rhythm. Grossly normal heart sounds.  Good peripheral circulation. Respiratory: Normal respiratory effort.  No  retractions. Lungs CTAB. Gastrointestinal: Soft and nontender. No distention. No abdominal bruits. No CVA tenderness. Musculoskeletal: No lower extremity tenderness nor edema.  No joint effusions. Neurologic:  Intoxicated. Normal speech and language. No gross focal neurologic deficits are appreciated. No gait instability. Skin:  Skin is warm, dry and intact. No rash noted. Psychiatric: Mood and affect are normal. Speech and behavior are normal.  ____________________________________________   LABS (all labs ordered are listed, but only abnormal results are displayed)  Labs Reviewed  COMPREHENSIVE METABOLIC PANEL - Abnormal; Notable for the following:       Result Value   Creatinine, Ser 1.38 (*)    All other components within normal limits  ETHANOL - Abnormal; Notable for the following:    Alcohol,  Ethyl (B) 194 (*)    All other components within normal limits  CBC - Abnormal; Notable for the following:    WBC 12.0 (*)    RDW 14.8 (*)    All other components within normal limits  URINALYSIS, COMPLETE (UACMP) WITH MICROSCOPIC - Abnormal; Notable for the following:    Color, Urine YELLOW (*)    APPearance CLEAR (*)    Specific Gravity, Urine 1.003 (*)    Bacteria, UA RARE (*)    Squamous Epithelial / LPF 0-5 (*)    All other components within normal limits  ACETAMINOPHEN LEVEL - Abnormal; Notable for the following:    Acetaminophen (Tylenol), Serum <10 (*)    All other components within normal limits  URINE DRUG SCREEN, QUALITATIVE ([facility] ONLY)  SALICYLATE LEVEL   ____________________________________________  EKG  ED ECG REPORT I, Kedrick Mcnamee J, the attending physician, personally viewed and interpreted this ECG.   Date: 11/11/2016  EKG Time: 0542  Rate: 68  Rhythm: normal EKG, normal sinus rhythm  Axis: normal  Intervals:none  ST&T Change: nonspecific  ____________________________________________  RADIOLOGY  No results  found.  ____________________________________________   PROCEDURES  Procedure(s) performed: None  Procedures  Critical Care performed: No  ____________________________________________   INITIAL IMPRESSION / ASSESSMENT AND PLAN / ED COURSE  Pertinent labs & imaging results that were available during my care of the patient were reviewed by me and considered in my medical decision making (see chart for details).  41 year old male with alcohol abuse requesting detox. Poison control recommended 6 hour observation for hydroxyzine ingestion which patient denies suicidality. Will consult TTS to help with detox placement.      ____________________________________________   FINAL CLINICAL IMPRESSION(S) / ED DIAGNOSES  Final diagnoses:  Alcoholic intoxication without complication ([place])  ETOH abuse  Accidental drug overdose, initial encounter      NEW MEDICATIONS STARTED DURING THIS VISIT:  New Prescriptions   No medications on file     Note:  This document was prepared using Dragon voice recognition software and may include unintentional dictation errors.    Paulette Blanch, MD 11/11/16 [phone]

## 2016-11-11 NOTE — BH Assessment (Signed)
Clinician spoke with pt. Pt reports h/o SAIOP tx at [facility] resulting in 2 years of sobriety. Pt reports recent alcohol relapse triggered by his mother falling ill. Pt reports that on yesterday he consumed six beers. Pt reports no additional substance use. Pt denies any h/o seizures. Pt continues to deny SI,HI and psychosis. Pt does not appear to be responding to internal stimuli or experiencing delusional thought content.  Pt requesting detox and SUD treatment.

## 2016-11-11 NOTE — ED Notes (Addendum)
Med rec updated and completed by this RN per meds provided by pt family. No narcotics present during med rec.

## 2016-11-11 NOTE — BH Assessment (Signed)
Received phone call from pt's RN (Amy) stating that EDP (Dr.Veronese) inquired about clinician's clinical opinion regarding pt being d/c to f/u with community treatment resources. Pt informed RN that pt had been referred to [facility]. Clinician is unable to provide timeframe as to when pt's referral may be reviewed by [facility]. Clinician does believe pt has capability and insight to follow through with community referrals. Pt does report h/o SAIOP tx at [facility]. RN requested clinician to inform EDP.  Clinician unable to reach EDP at this time (2 attempts)

## 2016-11-11 NOTE — ED Triage Notes (Addendum)
Pt bib [facility] d/t request for ETOH detox. Pt reports not a daily drinker, but drank 6 beers tonight and needs detox. Pt reports taking 6 hydroxyzine tonight because he is unhappy in his current living situation. Pt denies SI/HI at this time.

## 2016-11-11 NOTE — ED Notes (Addendum)
Poison control contacted by this RN. Recommend: 12 lead EKG, LFTs, ETOH, aspirin, acetaminophen, metabolic panel. Monitor for 6 hours post ingestion (pt states ingested 6 hydroxyzine at 9pm), if no other symptoms other than mild drowsiness, pt medically cleared from [facility] standpoint-other ailments at [facility] discretion. MD Beather Arbour made aware, verbalized plan of care to pt.

## 2016-11-11 NOTE — BH Assessment (Signed)
Per Dr. Beather Arbour, pt does not need TTS assessment. Pt requesting alcohol detox services. Pt denies SI/HI, and psychosis. TTS to assist with detox placement per EDP's request.  Per EDP note poison control does recommended 6hr observation. TTS would be unable to place until medically clear.

## 2016-11-11 NOTE — ED Notes (Signed)
Meal provided  He is sitting up in bed eating

## 2016-11-11 NOTE — ED Notes (Signed)
Received a call from Barry given  Labs WNL  PC has cleared the pt   He continues to rest quietly  NAD assessed

## 2016-11-15 ENCOUNTER — Encounter: Payer: Self-pay | Admitting: Emergency Medicine

## 2016-11-15 ENCOUNTER — Emergency Department
Admission: EM | Admit: 2016-11-15 | Discharge: 2016-11-15 | Disposition: A | Discharge status: Home / Self Care | Payer: Medicare Other | Attending: Emergency Medicine | Admitting: Emergency Medicine

## 2016-11-15 DIAGNOSIS — F10920 Alcohol use, unspecified with intoxication, uncomplicated: Secondary | ICD-10-CM | POA: Diagnosis not present

## 2016-11-15 DIAGNOSIS — E119 Type 2 diabetes mellitus without complications: Secondary | ICD-10-CM | POA: Insufficient documentation

## 2016-11-15 DIAGNOSIS — F17210 Nicotine dependence, cigarettes, uncomplicated: Secondary | ICD-10-CM | POA: Diagnosis not present

## 2016-11-15 DIAGNOSIS — I10 Essential (primary) hypertension: Secondary | ICD-10-CM | POA: Diagnosis not present

## 2016-11-15 DIAGNOSIS — Z79899 Other long term (current) drug therapy: Secondary | ICD-10-CM | POA: Diagnosis not present

## 2016-11-15 DIAGNOSIS — F329 Major depressive disorder, single episode, unspecified: Secondary | ICD-10-CM | POA: Insufficient documentation

## 2016-11-15 DIAGNOSIS — Z7982 Long term (current) use of aspirin: Secondary | ICD-10-CM | POA: Insufficient documentation

## 2016-11-15 HISTORY — DX: Other psychoactive substance abuse, uncomplicated: F19.10

## 2016-11-15 LAB — COMPREHENSIVE METABOLIC PANEL
ALK PHOS: 43 U/L (ref 38–126)
ALT: 35 U/L (ref 17–63)
ANION GAP: 8 (ref 5–15)
AST: 31 U/L (ref 15–41)
Albumin: 3.7 g/dL (ref 3.5–5.0)
BUN: 11 mg/dL (ref 6–20)
CALCIUM: 8.9 mg/dL (ref 8.9–10.3)
CHLORIDE: 106 mmol/L (ref 101–111)
CO2: 23 mmol/L (ref 22–32)
CREATININE: 1.3 mg/dL — AB (ref 0.61–1.24)
Glucose, Bld: 103 mg/dL — ABNORMAL HIGH (ref 65–99)
Potassium: 3.5 mmol/L (ref 3.5–5.1)
SODIUM: 137 mmol/L (ref 135–145)
Total Bilirubin: 0.2 mg/dL — ABNORMAL LOW (ref 0.3–1.2)
Total Protein: 6.8 g/dL (ref 6.5–8.1)

## 2016-11-15 LAB — CBC WITH DIFFERENTIAL/PLATELET
Basophils Absolute: 0.1 10*3/uL (ref 0–0.1)
Basophils Relative: 1 %
Eosinophils Absolute: 0.6 10*3/uL (ref 0–0.7)
Eosinophils Relative: 5 %
HEMATOCRIT: 40.3 % (ref 40.0–52.0)
HEMOGLOBIN: 14.3 g/dL (ref 13.0–18.0)
LYMPHS ABS: 4.2 10*3/uL — AB (ref 1.0–3.6)
Lymphocytes Relative: 38 %
MCH: 31.9 pg (ref 26.0–34.0)
MCHC: 35.4 g/dL (ref 32.0–36.0)
MCV: 90.1 fL (ref 80.0–100.0)
MONO ABS: 1.1 10*3/uL — AB (ref 0.2–1.0)
MONOS PCT: 10 %
NEUTROS ABS: 5.2 10*3/uL (ref 1.4–6.5)
NEUTROS PCT: 46 %
Platelets: 202 10*3/uL (ref 150–440)
RBC: 4.47 MIL/uL (ref 4.40–5.90)
RDW: 14.5 % (ref 11.5–14.5)
WBC: 11.1 10*3/uL — ABNORMAL HIGH (ref 3.8–10.6)

## 2016-11-15 LAB — URINE DRUG SCREEN, QUALITATIVE (ARMC ONLY)
AMPHETAMINES, UR SCREEN: NOT DETECTED
BARBITURATES, UR SCREEN: NOT DETECTED
BENZODIAZEPINE, UR SCRN: NOT DETECTED
Cannabinoid 50 Ng, Ur ~~LOC~~: NOT DETECTED
Cocaine Metabolite,Ur ~~LOC~~: NOT DETECTED
MDMA (Ecstasy)Ur Screen: NOT DETECTED
Methadone Scn, Ur: NOT DETECTED
OPIATE, UR SCREEN: NOT DETECTED
PHENCYCLIDINE (PCP) UR S: NOT DETECTED
Tricyclic, Ur Screen: NOT DETECTED

## 2016-11-15 LAB — ACETAMINOPHEN LEVEL

## 2016-11-15 LAB — ETHANOL: ALCOHOL ETHYL (B): 164 mg/dL — AB (ref <5)

## 2016-11-15 LAB — SALICYLATE LEVEL

## 2016-11-15 MED ORDER — VITAMIN B-1 100 MG PO TABS
100.0000 mg | ORAL_TABLET | Freq: Every day | ORAL | Status: DC
Start: 2016-11-15 — End: 2016-11-15

## 2016-11-15 MED ORDER — SODIUM CHLORIDE 0.9 % IV BOLUS (SEPSIS)
1000.0000 mL | Freq: Once | INTRAVENOUS | Status: AC
  Administered 2016-11-15: 1000 mL via INTRAVENOUS

## 2016-11-15 MED ORDER — LORAZEPAM 2 MG PO TABS: 0.0000 mg | ORAL_TABLET | Freq: Four times a day (QID) | ORAL | Status: DC

## 2016-11-15 NOTE — ED Notes (Signed)
Pt states he is here for detox from alcohol and cocaine. Pt states he drinks 14 or more beers daily. Pt states last used cocaine on Monday. Pt states last etoh before coming to ed. No sweating noted. Pt is alert and oriented to person, place, date. No tremors noted.

## 2016-11-15 NOTE — BH Assessment (Signed)
Patient initially stated he wanted to detox from alcohol and cocaine.  Tonight he reported he had 14 beers.  The patient denies having a drink in 6 days.  Six days ago the patient had 6 beers.  The patient eventually stated he was looking for a place to live.  He is currently living with a friend and the home has bed bugs and roaches everywhere according to the patient.  His primary stressor is his mother being sick and trying to help her.  The patient currently goes to [employer], where he has a Conservator, museum/gallery Geophysical data processor) and an Corporate investment banker Psychologist, forensic).  According to the patient, he is on a wait list for housing.  The patient reported he uses cocaine about once a month.  His BAC was 164 and his UDS was negative for all drugs.  It was explained to the patient that it would be difficult to find a detox facility for the patient, since it did not appear that he was physically addicted to alcohol, but would try to find placement.  The patient then stated he would call someone to pick him up, so he can go back home.  The patient denied SI, HI, and psychosis.

## 2016-11-15 NOTE — ED Triage Notes (Signed)
Pt here for detox off alcohol and drugs; drank 14 beers today; last smoked marijuana and used cocaine on Monday; denies suicidal thoughts; pt here 4 days ago for same;

## 2016-11-15 NOTE — Discharge Instructions (Signed)
Drink alcohol only in moderation.  Return to the ER for worsening symptoms, persistent vomiting, difficulty breathing or other concerns. 

## 2016-11-15 NOTE — ED Provider Notes (Signed)
[facility] Emergency Department Provider Note   ____________________________________________   First MD Initiated Contact with Patient 11/15/16 [phone]     (approximate)  I have reviewed the triage vital signs and the nursing notes.   HISTORY  Chief Complaint Addiction Problem    HPI Alan Eaton is a 41 y.o. male who presents to the ED from home chief complaint of requesting detox. Patient requests detox off alcohol and drugs. He is a binge drinker who drinks 14 beers today. Last smoked marijuana and used cocaine approximately one week ago.patient was here 4 days ago requesting the same. The facility denied him because he is not a long-standing drinker and rather just drinks occasionally. Denies SI/HI/AH/VH. Voices no medical complaints.   Past Medical History:  Diagnosis Date  . Alcohol abuse   . Bronchitis   . Depression   . Diabetes mellitus without complication ([place])   . Drug abuse   . GERD (gastroesophageal reflux disease)   . Gout   . Hypertension   . Morbid obesity ([place])   . Suicide ideation     Patient Active Problem List   Diagnosis Date Noted  . Tachycardia 04/19/2016  . Factitious disorder 01/13/2016  . Unspecified depressive disorder 01/13/2016  . Alcohol use disorder, severe, dependence ([place]) 01/13/2016  . 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 HTN (hypertension) 01/05/2016  . Gout 01/05/2016  . GERD (gastroesophageal reflux disease) 01/05/2016  . Diabetes ([place]) 01/05/2016  . Tobacco use disorder 01/05/2016  . Obesity 10/27/2015    Past Surgical History:  Procedure Laterality Date  . none      Prior to Admission medications   Medication Sig Start Date End Date Taking? Authorizing Provider  allopurinol (ZYLOPRIM) 300 MG tablet Take 300 mg by mouth daily.    [provider]  amLODipine (NORVASC) 5 MG tablet Take 1 tablet (5 mg total) by mouth daily. 09/27/16   Mikey College, NP  aspirin EC 81 MG tablet  Take 81 mg by mouth daily.    [provider]  B Complex-C (B-COMPLEX WITH VITAMIN C) tablet Take 1 tablet by mouth daily.    [provider]  colchicine 0.6 MG tablet Take 1 tablet (0.6 mg total) by mouth daily as needed. 09/27/16   Mikey College, NP  dicyclomine (BENTYL) 10 MG capsule Take 1 capsule (10 mg total) by mouth 3 (three) times daily before meals. 09/27/16   Mikey College, NP  famotidine (PEPCID) 40 MG tablet TAKE 1 TABLET BY MOUTH BEDTIME FOR STOMACH ACID 04/22/16   Arlis Porta., MD  hydrOXYzine (ATARAX/VISTARIL) 25 MG tablet Take 1 tablet (25 mg total) by mouth every 6 (six) hours as needed. 09/27/16   Mikey College, NP  levofloxacin (LEVAQUIN) 500 MG tablet Take 500 mg by mouth daily.    [provider]  losartan (COZAAR) 100 MG tablet Take 1 tablet (100 mg total) by mouth daily. 09/27/16   Mikey College, NP  meclizine (ANTIVERT) 25 MG tablet Take 1 tablet (25 mg total) by mouth 3 (three) times daily as needed for dizziness or nausea. 09/27/16   Mikey College, NP  meloxicam (MOBIC) 7.5 MG tablet Take 7.5 mg by mouth daily.    [provider]  metoprolol (TOPROL-XL) 200 MG 24 hr tablet Take 1 tablet (200 mg total) by mouth daily. Take with or immediately following a meal. 09/27/16   Mikey College, NP  Multiple Vitamin (MULTIVITAMIN WITH MINERALS) TABS tablet Take  1 tablet by mouth daily.    [provider]  omeprazole (PRILOSEC) 20 MG capsule Take 1 capsule (20 mg total) by mouth daily. 09/27/16   Mikey College, NP  predniSONE (DELTASONE) 20 MG tablet 4 days take 3 pills. For 4 days take 2 pills, for 4 days take 1 pill, then 2 days, take 1/2 pill. 09/27/16   Mikey College, NP  promethazine (PHENERGAN) 25 MG tablet Take 1 tablet (25 mg total) by mouth every 6 (six) hours as needed for nausea or vomiting. 09/14/16   Mikey College, NP  traZODone (DESYREL) 100 MG tablet  07/24/16    [provider]  VENTOLIN HFA 108 (90 Base) MCG/ACT inhaler INHALE 2 PUFFS BY MOUTH EVERY 4 TO 6 HOURS AS NEEDED. 11/18/15   Arlis Porta., MD    Allergies Bee venom and Bactrim [sulfamethoxazole-trimethoprim]  Family History  Problem Relation Age of Onset  . Hypertension Unknown   . Diabetes Mellitus II Unknown     Social History Social History  Substance Use Topics  . Smoking status: Current Every Day Smoker    Packs/day: 1.00    Types: Cigarettes  . Smokeless tobacco: Never Used  . Alcohol use 0.0 oz/week     Comment: 14 beers today    Review of Systems  Constitutional: No fever/chills Eyes: No visual changes. ENT: No sore throat. Cardiovascular: Denies chest pain. Respiratory: Denies shortness of breath. Gastrointestinal: No abdominal pain.  No nausea, no vomiting.  No diarrhea.  No constipation. Genitourinary: Negative for dysuria. Musculoskeletal: Negative for back pain. Skin: Negative for rash. Neurological: Negative for headaches, focal weakness or numbness. Psychiatric:positive for alcohol intoxication.  ____________________________________________   PHYSICAL EXAM:  VITAL SIGNS: ED Triage Vitals  Enc Vitals Group     BP 11/15/16 0054 (!) 106/59     Pulse Rate 11/15/16 0054 71     Resp 11/15/16 0054 18     Temp 11/15/16 0054 97.7 F (36.5 C)     Temp Source 11/15/16 0054 Oral     SpO2 11/15/16 0054 94 %     Weight 11/15/16 0052 250 lb (113.4 kg)     Height 11/15/16 0052 6\' 1"  (1.854 m)     Head Circumference --      Peak Flow --      Pain Score 11/15/16 0051 0     Pain Loc --      Pain Edu? --      Excl. in [place]? --     Constitutional: Alert and oriented. Well appearing and in no acute distress. Pleasantly intoxicated. Eyes: Conjunctivae are normal. PERRL. EOMI. Head: Atraumatic. Nose: No congestion/rhinnorhea. Mouth/Throat: Mucous membranes are moist.  Oropharynx non-erythematous. Neck: No stridor.  No cervical spine  tenderness to palpation. Cardiovascular: Normal rate, regular rhythm. Grossly normal heart sounds.  Good peripheral circulation. Respiratory: Normal respiratory effort.  No retractions. Lungs CTAB. Gastrointestinal: Soft and nontender. No distention. No abdominal bruits. No CVA tenderness. Musculoskeletal: No lower extremity tenderness nor edema.  No joint effusions. Neurologic:  Normal speech and language. No gross focal neurologic deficits are appreciated. No gait instability. Skin:  Skin is warm, dry and intact. No rash noted. Psychiatric: Mood and affect are normal. Speech and behavior are normal.  ____________________________________________   LABS (all labs ordered are listed, but only abnormal results are displayed)  Labs Reviewed  CBC WITH DIFFERENTIAL/PLATELET  COMPREHENSIVE METABOLIC PANEL  ETHANOL  ACETAMINOPHEN LEVEL  SALICYLATE LEVEL  URINE DRUG SCREEN, QUALITATIVE (  [facility] ONLY)   ____________________________________________  EKG  none ____________________________________________  RADIOLOGY  No results found.  ____________________________________________   PROCEDURES  Procedure(s) performed: None  Procedures  Critical Care performed: No  ____________________________________________   INITIAL IMPRESSION / ASSESSMENT AND PLAN / ED COURSE  Pertinent labs & imaging results that were available during my care of the patient were reviewed by me and considered in my medical decision making (see chart for details).  41 year old male requesting detox from alcohol and substances. He was here just 4 days ago for same and was denied inpatient detox because he did not meet criteria. I explained this to him and patient is insisting on speaking with someone to help with detox. TTS consulted, gave patient some outpatient resources and outpatient is ready for discharge. He is ambulatory with steady gait and has a ride coming to pick him up. Strict return precautions  given. Patient verbalizes understanding and agrees with plan of care.      ____________________________________________   FINAL CLINICAL IMPRESSION(S) / ED DIAGNOSES  Final diagnoses:  Alcoholic intoxication without complication ([place])      NEW MEDICATIONS STARTED DURING THIS VISIT:  New Prescriptions   No medications on file     Note:  This document was prepared using Dragon voice recognition software and may include unintentional dictation errors.    Paulette Blanch, MD 11/15/16 [phone]

## 2016-11-15 NOTE — ED Notes (Signed)
Pt eating sandwich.

## 2016-11-27 ENCOUNTER — Encounter: Payer: Self-pay | Admitting: Emergency Medicine

## 2016-11-27 ENCOUNTER — Emergency Department
Admission: EM | Admit: 2016-11-27 | Discharge: 2016-11-27 | Disposition: A | Discharge status: Home / Self Care | Payer: Medicare Other | Attending: Emergency Medicine | Admitting: Emergency Medicine

## 2016-11-27 DIAGNOSIS — F17210 Nicotine dependence, cigarettes, uncomplicated: Secondary | ICD-10-CM | POA: Diagnosis not present

## 2016-11-27 DIAGNOSIS — I10 Essential (primary) hypertension: Secondary | ICD-10-CM | POA: Insufficient documentation

## 2016-11-27 DIAGNOSIS — E119 Type 2 diabetes mellitus without complications: Secondary | ICD-10-CM | POA: Diagnosis not present

## 2016-11-27 DIAGNOSIS — F329 Major depressive disorder, single episode, unspecified: Secondary | ICD-10-CM | POA: Insufficient documentation

## 2016-11-27 DIAGNOSIS — R45851 Suicidal ideations: Secondary | ICD-10-CM | POA: Diagnosis not present

## 2016-11-27 DIAGNOSIS — F1010 Alcohol abuse, uncomplicated: Secondary | ICD-10-CM | POA: Diagnosis present

## 2016-11-27 DIAGNOSIS — F10929 Alcohol use, unspecified with intoxication, unspecified: Secondary | ICD-10-CM

## 2016-11-27 DIAGNOSIS — Z79899 Other long term (current) drug therapy: Secondary | ICD-10-CM | POA: Diagnosis not present

## 2016-11-27 DIAGNOSIS — F32A Depression, unspecified: Secondary | ICD-10-CM

## 2016-11-27 LAB — COMPREHENSIVE METABOLIC PANEL
ALK PHOS: 46 U/L (ref 38–126)
ALT: 43 U/L (ref 17–63)
AST: 33 U/L (ref 15–41)
Albumin: 3.9 g/dL (ref 3.5–5.0)
Anion gap: 9 (ref 5–15)
BUN: 11 mg/dL (ref 6–20)
CALCIUM: 8.8 mg/dL — AB (ref 8.9–10.3)
CHLORIDE: 107 mmol/L (ref 101–111)
CO2: 23 mmol/L (ref 22–32)
CREATININE: 1.23 mg/dL (ref 0.61–1.24)
Glucose, Bld: 148 mg/dL — ABNORMAL HIGH (ref 65–99)
Potassium: 3.9 mmol/L (ref 3.5–5.1)
Sodium: 139 mmol/L (ref 135–145)
Total Bilirubin: 0.2 mg/dL — ABNORMAL LOW (ref 0.3–1.2)
Total Protein: 7.1 g/dL (ref 6.5–8.1)

## 2016-11-27 LAB — CBC
HCT: 42.2 % (ref 40.0–52.0)
HEMOGLOBIN: 14.8 g/dL (ref 13.0–18.0)
MCH: 32.5 pg (ref 26.0–34.0)
MCHC: 35 g/dL (ref 32.0–36.0)
MCV: 92.7 fL (ref 80.0–100.0)
Platelets: 219 10*3/uL (ref 150–440)
RBC: 4.55 MIL/uL (ref 4.40–5.90)
RDW: 14.9 % — ABNORMAL HIGH (ref 11.5–14.5)
WBC: 13.1 10*3/uL — ABNORMAL HIGH (ref 3.8–10.6)

## 2016-11-27 LAB — BRAIN NATRIURETIC PEPTIDE: B NATRIURETIC PEPTIDE 5: 19 pg/mL (ref 0.0–100.0)

## 2016-11-27 LAB — ETHANOL: Alcohol, Ethyl (B): 205 mg/dL — ABNORMAL HIGH (ref <5)

## 2016-11-27 LAB — ACETAMINOPHEN LEVEL: Acetaminophen (Tylenol), Serum: <10 ug/mL — ABNORMAL LOW (ref 10–30)

## 2016-11-27 LAB — SALICYLATE LEVEL

## 2016-11-27 MED ORDER — HYDROXYZINE HCL 25 MG PO TABS: 25.0000 mg | ORAL_TABLET | Freq: Three times a day (TID) | ORAL | 0 refills | Status: DC | PRN

## 2016-11-27 MED ORDER — NALTREXONE HCL 50 MG PO TABS: 50.0000 mg | ORAL_TABLET | Freq: Every day | ORAL | 0 refills | Status: AC

## 2016-11-27 NOTE — ED Provider Notes (Signed)
[facility] Emergency Department Provider Note  ____________________________________________   First MD Initiated Contact with Patient 11/27/16 [phone]     (approximate)  I have reviewed the triage vital signs and the nursing notes.   HISTORY  Chief Complaint Alcohol Problem   HPI Roshan Roback is a 41 y.o. male who comes to the emergency department requesting help stopping drinking alcohol. He says he is a chronic alcoholic and has had recent life stressors which have caused him to want to die. He said he is actively suicidal although he is unable to give me a clear plan. He says he has attempted suicide in the past. As he is supposed to be taking psychiatric medications but he has not been. His depression has been slowly progressive and seems to beconstant.   Past Medical History:  Diagnosis Date  . Alcohol abuse   . Bronchitis   . Depression   . Diabetes mellitus without complication ([place])   . Drug abuse   . GERD (gastroesophageal reflux disease)   . Gout   . Hypertension   . Morbid obesity ([place])   . Suicide ideation     Patient Active Problem List   Diagnosis Date Noted  . Tachycardia 04/19/2016  . Factitious disorder 01/13/2016  . Unspecified depressive disorder 01/13/2016  . Alcohol use disorder, severe, dependence ([place]) 01/13/2016  . 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 HTN (hypertension) 01/05/2016  . Gout 01/05/2016  . GERD (gastroesophageal reflux disease) 01/05/2016  . Diabetes ([place]) 01/05/2016  . Tobacco use disorder 01/05/2016  . Obesity 10/27/2015    Past Surgical History:  Procedure Laterality Date  . none      Prior to Admission medications   Medication Sig Start Date End Date Taking? Authorizing Provider  allopurinol (ZYLOPRIM) 300 MG tablet Take 300 mg by mouth daily.    [provider]  amLODipine (NORVASC) 5 MG tablet Take 1 tablet (5 mg total) by mouth daily. 09/27/16   Mikey College, NP  aspirin  EC 81 MG tablet Take 81 mg by mouth daily.    [provider]  B Complex-C (B-COMPLEX WITH VITAMIN C) tablet Take 1 tablet by mouth daily.    [provider]  colchicine 0.6 MG tablet Take 1 tablet (0.6 mg total) by mouth daily as needed. 09/27/16   Mikey College, NP  dicyclomine (BENTYL) 10 MG capsule Take 1 capsule (10 mg total) by mouth 3 (three) times daily before meals. 09/27/16   Mikey College, NP  famotidine (PEPCID) 40 MG tablet TAKE 1 TABLET BY MOUTH BEDTIME FOR STOMACH ACID 04/22/16   Arlis Porta., MD  hydrOXYzine (ATARAX/VISTARIL) 25 MG tablet Take 1 tablet (25 mg total) by mouth every 6 (six) hours as needed. 09/27/16   Mikey College, NP  levofloxacin (LEVAQUIN) 500 MG tablet Take 500 mg by mouth daily.    [provider]  losartan (COZAAR) 100 MG tablet Take 1 tablet (100 mg total) by mouth daily. 09/27/16   Mikey College, NP  meclizine (ANTIVERT) 25 MG tablet Take 1 tablet (25 mg total) by mouth 3 (three) times daily as needed for dizziness or nausea. 09/27/16   Mikey College, NP  meloxicam (MOBIC) 7.5 MG tablet Take 7.5 mg by mouth daily.    [provider]  metoprolol (TOPROL-XL) 200 MG 24 hr tablet Take 1 tablet (200 mg total) by mouth daily. Take with or immediately following a meal. 09/27/16   Mikey College,  NP  Multiple Vitamin (MULTIVITAMIN WITH MINERALS) TABS tablet Take 1 tablet by mouth daily.    [provider]  omeprazole (PRILOSEC) 20 MG capsule Take 1 capsule (20 mg total) by mouth daily. 09/27/16   Mikey College, NP  predniSONE (DELTASONE) 20 MG tablet 4 days take 3 pills. For 4 days take 2 pills, for 4 days take 1 pill, then 2 days, take 1/2 pill. 09/27/16   Mikey College, NP  promethazine (PHENERGAN) 25 MG tablet Take 1 tablet (25 mg total) by mouth every 6 (six) hours as needed for nausea or vomiting. 09/14/16   Mikey College, NP  traZODone (DESYREL) 100 MG tablet   07/24/16   [provider]  VENTOLIN HFA 108 (90 Base) MCG/ACT inhaler INHALE 2 PUFFS BY MOUTH EVERY 4 TO 6 HOURS AS NEEDED. 11/18/15   Arlis Porta., MD    Allergies Bee venom and Bactrim [sulfamethoxazole-trimethoprim]  Family History  Problem Relation Age of Onset  . Hypertension Unknown   . Diabetes Mellitus II Unknown     Social History Social History  Substance Use Topics  . Smoking status: Current Every Day Smoker    Packs/day: 1.00    Types: Cigarettes  . Smokeless tobacco: Never Used  . Alcohol use 0.0 oz/week     Comment: 14 beers today    Review of Systems Constitutional: No fever/chills Eyes: No visual changes. ENT: No sore throat. Cardiovascular: Denies chest pain. Respiratory: Denies shortness of breath. Gastrointestinal: No abdominal pain.  No nausea, no vomiting.  No diarrhea.  No constipation. Genitourinary: Negative for dysuria. Musculoskeletal: Negative for back pain. Skin: Negative for rash. Neurological: Negative for headaches, focal weakness or numbness.   ____________________________________________   PHYSICAL EXAM:  VITAL SIGNS: ED Triage Vitals [11/27/16 0510]  Enc Vitals Group     BP      Pulse Rate 88     Resp 18     Temp 98.2 F (36.8 C)     Temp Source Oral     SpO2 97 %     Weight 250 lb (113.4 kg)     Height 6\' 2"  (1.88 m)     Head Circumference      Peak Flow      Pain Score 0     Pain Loc      Pain Edu?      Excl. in [place]?     Constitutional: alert and oriented 4 status flat affect alcohol on his breath Eyes: PERRL EOMI. Head: Atraumatic. Nose: No congestion/rhinnorhea. Mouth/Throat: No trismus Neck: No stridor.   Cardiovascular: Normal rate, regular rhythm. Grossly normal heart sounds.  Good peripheral circulation. Respiratory: Normal respiratory effort.  No retractions. Lungs CTAB and moving good air Gastrointestinal: soft nontender Musculoskeletal: No lower extremity edema   Neurologic:  Normal  speech and language. No gross focal neurologic deficits are appreciated. Skin:  Skin is warm, dry and intact. No rash noted. Psychiatric: sad affect    ____________________________________________   DIFFERENTIAL includes but not limited to  suicide attempt, suicidal ideation, drug overdose, alcohol intoxication ____________________________________________   LABS (all labs ordered are listed, but only abnormal results are displayed)  Labs Reviewed  COMPREHENSIVE METABOLIC PANEL - Abnormal; Notable for the following:       Result Value   Glucose, Bld 148 (*)    Calcium 8.8 (*)    Total Bilirubin 0.2 (*)    All other components within normal limits  ETHANOL - Abnormal; Notable  for the following:    Alcohol, Ethyl (B) 205 (*)    All other components within normal limits  ACETAMINOPHEN LEVEL - Abnormal; Notable for the following:    Acetaminophen (Tylenol), Serum <10 (*)    All other components within normal limits  CBC - Abnormal; Notable for the following:    WBC 13.1 (*)    RDW 14.9 (*)    All other components within normal limits  SALICYLATE LEVEL  URINE DRUG SCREEN, QUALITATIVE ([facility] ONLY)    elevated ethanol level but no signs of acute ingestion __________________________________________  EKG   ____________________________________________  RADIOLOGY   ____________________________________________   PROCEDURES  Procedure(s) performed: no  Procedures  Critical Care performed: no  Observation: no ____________________________________________   INITIAL IMPRESSION / ASSESSMENT AND PLAN / ED COURSE  Pertinent labs & imaging results that were available during my care of the patient were reviewed by me and considered in my medical decision making (see chart for details).  the patient arrives obviously intoxicated with suicidal ideation. He denies actually tried to hurt himself. He agrees to remain voluntarily.  Clinical Course as of Nov 28 738  Sat Nov 27, 2016  7591 Assuming care from Dr. Mable Paris.  In short, Shanda Bumps is a 41 y.o. male with a chief complaint of ETOH and SI.  Refer to the original H&P for additional details.  The current plan of care is to follow up [facility] recommendations.    [CF]    Clinical Course User Index [CF] Hinda Kehr, MD     ____________________________________________   FINAL CLINICAL IMPRESSION(S) / ED DIAGNOSES  Final diagnoses:  None      NEW MEDICATIONS STARTED DURING THIS VISIT:  New Prescriptions   No medications on file     Note:  This document was prepared using Dragon voice recognition software and may include unintentional dictation errors.     Darel Hong, MD 11/27/16 [phone]

## 2016-11-27 NOTE — ED Triage Notes (Signed)
Pt to triage via wheelchair reports problems with drinking and depression, reports SI w/o any specific plan or intent.  Pt appears intoxicated in triage.

## 2016-11-27 NOTE — ED Notes (Signed)
Alert, oriented, haas completed SOC, discharged with ride on way.

## 2016-11-27 NOTE — ED Notes (Signed)
Pt. States hx of depression and alcohol abuse.  Pt. States he also does not like his living arrangement.  Pt. States having SI thoughts without a clear plan.

## 2016-11-27 NOTE — Discharge Instructions (Signed)
Please make an appointment to establish care with a psychiatrist. Return to the emergency department for any concerns.  It was a pleasure to take care of you today, and thank you for coming to our emergency department.  If you have any questions or concerns before leaving please ask the nurse to grab me and I'm more than happy to go through your aftercare instructions again.  If you were prescribed any opioid pain medication today such as Norco, Vicodin, Percocet, morphine, hydrocodone, or oxycodone please make sure you do not drive when you are taking this medication as it can alter your ability to drive safely.  If you have any concerns once you are home that you are not improving or are in fact getting worse before you can make it to your follow-up appointment, please do not hesitate to call 911 and come back for further evaluation.  Darel Hong, MD  Results for orders placed or performed during the hospital encounter of 11/27/16  Comprehensive metabolic panel  Result Value Ref Range   Sodium 139 135 - 145 mmol/L   Potassium 3.9 3.5 - 5.1 mmol/L   Chloride 107 101 - 111 mmol/L   CO2 23 22 - 32 mmol/L   Glucose, Bld 148 (H) 65 - 99 mg/dL   BUN 11 6 - 20 mg/dL   Creatinine, Ser 1.23 0.61 - 1.24 mg/dL   Calcium 8.8 (L) 8.9 - 10.3 mg/dL   Total Protein 7.1 6.5 - 8.1 g/dL   Albumin 3.9 3.5 - 5.0 g/dL   AST 33 15 - 41 U/L   ALT 43 17 - 63 U/L   Alkaline Phosphatase 46 38 - 126 U/L   Total Bilirubin 0.2 (L) 0.3 - 1.2 mg/dL   GFR calc non Af Amer >60 >60 mL/min   GFR calc Af Amer >60 >60 mL/min   Anion gap 9 5 - 15  Ethanol  Result Value Ref Range   Alcohol, Ethyl (B) 205 (H) <5 mg/dL  Salicylate level  Result Value Ref Range   Salicylate Lvl <9.2 2.8 - 30.0 mg/dL  Acetaminophen level  Result Value Ref Range   Acetaminophen (Tylenol), Serum <10 (L) 10 - 30 ug/mL  cbc  Result Value Ref Range   WBC 13.1 (H) 3.8 - 10.6 K/uL   RBC 4.55 4.40 - 5.90 MIL/uL   Hemoglobin 14.8 13.0 -  18.0 g/dL   HCT 42.2 40.0 - 52.0 %   MCV 92.7 80.0 - 100.0 fL   MCH 32.5 26.0 - 34.0 pg   MCHC 35.0 32.0 - 36.0 g/dL   RDW 14.9 (H) 11.5 - 14.5 %   Platelets 219 150 - 440 K/uL   Dg Chest 2 View  Result Date: 11/02/2016 CLINICAL DATA:  Acute onset of midsternal chest pain and testicular swelling. Shortness of breath and dysuria. Initial encounter. EXAM: CHEST  2 VIEW COMPARISON:  Chest radiograph and CT of the chest performed 09/23/2016 FINDINGS: The lungs are well-aerated and clear. There is no evidence of focal opacification, pleural effusion or pneumothorax. The heart is borderline normal in size. No acute osseous abnormalities are seen. IMPRESSION: No acute cardiopulmonary process seen. Electronically Signed   By: Garald Balding M.D.   On: 11/02/2016 04:14   US Scrotum  Result Date: 11/02/2016 CLINICAL DATA:  Acute onset of bilateral testicular pain and swelling. Initial encounter. EXAM: SCROTAL ULTRASOUND DOPPLER ULTRASOUND OF THE TESTICLES TECHNIQUE: Complete ultrasound examination of the testicles, epididymis, and other scrotal structures was performed. Color and spectral  Doppler ultrasound were also utilized to evaluate blood flow to the testicles. COMPARISON:  Scrotal ultrasound performed 08/10/2015 FINDINGS: Right testicle Measurements: 4.7 x 2.8 x 2.9 cm. No mass or microlithiasis visualized. Left testicle Measurements: 4.9 x 2.9 x 3.1 cm. No mass or microlithiasis visualized. Right epididymis:  Normal in size and appearance. Left epididymis:  Normal in size and appearance. Hydrocele:  A small mildly complex left-sided hydrocele is noted. Varicocele:  None visualized. Pulsed Doppler interrogation of both testes demonstrates normal low resistance arterial and venous waveforms bilaterally. IMPRESSION: 1. No evidence of testicular torsion. 2. Small mildly complex left-sided hydrocele noted. This may reflect underlying infection. Electronically Signed   By: Garald Balding M.D.   On: 11/02/2016  04:54   [place] Art/ven Flow Abd Pelv Doppler  Result Date: 11/02/2016 CLINICAL DATA:  Acute onset of bilateral testicular pain and swelling. Initial encounter. EXAM: SCROTAL ULTRASOUND DOPPLER ULTRASOUND OF THE TESTICLES TECHNIQUE: Complete ultrasound examination of the testicles, epididymis, and other scrotal structures was performed. Color and spectral Doppler ultrasound were also utilized to evaluate blood flow to the testicles. COMPARISON:  Scrotal ultrasound performed 08/10/2015 FINDINGS: Right testicle Measurements: 4.7 x 2.8 x 2.9 cm. No mass or microlithiasis visualized. Left testicle Measurements: 4.9 x 2.9 x 3.1 cm. No mass or microlithiasis visualized. Right epididymis:  Normal in size and appearance. Left epididymis:  Normal in size and appearance. Hydrocele:  A small mildly complex left-sided hydrocele is noted. Varicocele:  None visualized. Pulsed Doppler interrogation of both testes demonstrates normal low resistance arterial and venous waveforms bilaterally. IMPRESSION: 1. No evidence of testicular torsion. 2. Small mildly complex left-sided hydrocele noted. This may reflect underlying infection. Electronically Signed   By: Garald Balding M.D.   On: 11/02/2016 04:54

## 2016-11-27 NOTE — ED Provider Notes (Signed)
the patient contracts for safety. Psychiatrist Dr. Sela Hilding recommends outpatient management with oral naltrexone. He is discharged home in improved condition.   Darel Hong, MD 11/27/16 [phone]

## 2016-12-06 ENCOUNTER — Other Ambulatory Visit: Payer: Self-pay

## 2016-12-06 DIAGNOSIS — I10 Essential (primary) hypertension: Secondary | ICD-10-CM

## 2016-12-06 MED ORDER — AMLODIPINE BESYLATE 5 MG PO TABS: 5.0000 mg | ORAL_TABLET | Freq: Every day | ORAL | 2 refills | Status: DC

## 2017-01-04 ENCOUNTER — Ambulatory Visit: Payer: Self-pay | Admitting: Nurse Practitioner

## 2017-01-24 ENCOUNTER — Other Ambulatory Visit: Payer: Self-pay

## 2017-01-24 ENCOUNTER — Other Ambulatory Visit: Payer: Self-pay | Admitting: Nurse Practitioner

## 2017-01-24 DIAGNOSIS — F419 Anxiety disorder, unspecified: Secondary | ICD-10-CM

## 2017-01-24 MED ORDER — HYDROXYZINE HCL 25 MG PO TABS: 25.0000 mg | ORAL_TABLET | Freq: Three times a day (TID) | ORAL | 0 refills | Status: DC | PRN

## 2017-02-16 ENCOUNTER — Other Ambulatory Visit: Payer: Self-pay

## 2017-04-04 ENCOUNTER — Telehealth: Payer: Self-pay | Admitting: Nurse Practitioner

## 2017-04-04 NOTE — Telephone Encounter (Signed)
Called pt to sched for AWV with Nurse Health Advisor.  C/b #  [phone] on Skype [email or web address] if you have questions ° °

## 2017-07-24 ENCOUNTER — Other Ambulatory Visit: Payer: Self-pay

## 2017-07-24 ENCOUNTER — Emergency Department
Admission: EM | Admit: 2017-07-24 | Discharge: 2017-07-24 | Disposition: A | Discharge status: Home / Self Care | Payer: Medicare Other | Attending: Emergency Medicine | Admitting: Emergency Medicine

## 2017-07-24 DIAGNOSIS — F1910 Other psychoactive substance abuse, uncomplicated: Secondary | ICD-10-CM | POA: Diagnosis not present

## 2017-07-24 DIAGNOSIS — Z79899 Other long term (current) drug therapy: Secondary | ICD-10-CM | POA: Diagnosis not present

## 2017-07-24 DIAGNOSIS — I10 Essential (primary) hypertension: Secondary | ICD-10-CM | POA: Diagnosis not present

## 2017-07-24 DIAGNOSIS — E119 Type 2 diabetes mellitus without complications: Secondary | ICD-10-CM | POA: Diagnosis not present

## 2017-07-24 DIAGNOSIS — F17210 Nicotine dependence, cigarettes, uncomplicated: Secondary | ICD-10-CM | POA: Diagnosis not present

## 2017-07-24 DIAGNOSIS — F10920 Alcohol use, unspecified with intoxication, uncomplicated: Secondary | ICD-10-CM | POA: Diagnosis not present

## 2017-07-24 DIAGNOSIS — F10929 Alcohol use, unspecified with intoxication, unspecified: Secondary | ICD-10-CM | POA: Diagnosis present

## 2017-07-24 DIAGNOSIS — Z7982 Long term (current) use of aspirin: Secondary | ICD-10-CM | POA: Diagnosis not present

## 2017-07-24 LAB — URINE DRUG SCREEN, QUALITATIVE (ARMC ONLY)
Amphetamines, Ur Screen: NOT DETECTED
BARBITURATES, UR SCREEN: NOT DETECTED
Benzodiazepine, Ur Scrn: NOT DETECTED
CANNABINOID 50 NG, UR ~~LOC~~: NOT DETECTED
COCAINE METABOLITE, UR ~~LOC~~: NOT DETECTED
MDMA (ECSTASY) UR SCREEN: NOT DETECTED
Methadone Scn, Ur: NOT DETECTED
OPIATE, UR SCREEN: NOT DETECTED
Phencyclidine (PCP) Ur S: NOT DETECTED
Tricyclic, Ur Screen: NOT DETECTED

## 2017-07-24 LAB — CBC WITH DIFFERENTIAL/PLATELET
BASOS PCT: 1 %
Basophils Absolute: 0.1 10*3/uL (ref 0–0.1)
EOS ABS: 0.5 10*3/uL (ref 0–0.7)
Eosinophils Relative: 5 %
HCT: 44.3 % (ref 40.0–52.0)
HEMOGLOBIN: 15.3 g/dL (ref 13.0–18.0)
Lymphocytes Relative: 27 %
Lymphs Abs: 2.7 10*3/uL (ref 1.0–3.6)
MCH: 31.8 pg (ref 26.0–34.0)
MCHC: 34.5 g/dL (ref 32.0–36.0)
MCV: 92.2 fL (ref 80.0–100.0)
Monocytes Absolute: 1.1 10*3/uL — ABNORMAL HIGH (ref 0.2–1.0)
Monocytes Relative: 11 %
NEUTROS PCT: 56 %
Neutro Abs: 5.8 10*3/uL (ref 1.4–6.5)
PLATELETS: 250 10*3/uL (ref 150–440)
RBC: 4.81 MIL/uL (ref 4.40–5.90)
RDW: 14.2 % (ref 11.5–14.5)
WBC: 10.2 10*3/uL (ref 3.8–10.6)

## 2017-07-24 LAB — TROPONIN I
Troponin I: <0.03 ng/mL (ref <0.03)
Troponin I: <0.03 ng/mL (ref <0.03)

## 2017-07-24 LAB — BASIC METABOLIC PANEL
ANION GAP: 10 (ref 5–15)
BUN: 6 mg/dL (ref 6–20)
CALCIUM: 8.8 mg/dL — AB (ref 8.9–10.3)
CO2: 27 mmol/L (ref 22–32)
Chloride: 105 mmol/L (ref 101–111)
Creatinine, Ser: 1.08 mg/dL (ref 0.61–1.24)
GFR calc non Af Amer: >60 mL/min (ref >60)
Glucose, Bld: 87 mg/dL (ref 65–99)
POTASSIUM: 3.7 mmol/L (ref 3.5–5.1)
Sodium: 142 mmol/L (ref 135–145)

## 2017-07-24 LAB — URINALYSIS, COMPLETE (UACMP) WITH MICROSCOPIC
BILIRUBIN URINE: NEGATIVE
Bacteria, UA: NONE SEEN
Glucose, UA: NEGATIVE mg/dL
HGB URINE DIPSTICK: NEGATIVE
Ketones, ur: NEGATIVE mg/dL
LEUKOCYTES UA: NEGATIVE
NITRITE: NEGATIVE
Protein, ur: 100 mg/dL — AB
SPECIFIC GRAVITY, URINE: 1.006 (ref 1.005–1.030)
pH: 5 (ref 5.0–8.0)

## 2017-07-24 LAB — SALICYLATE LEVEL: Salicylate Lvl: <7 mg/dL (ref 2.8–30.0)

## 2017-07-24 LAB — ETHANOL: ALCOHOL ETHYL (B): 241 mg/dL — AB (ref <10)

## 2017-07-24 LAB — ACETAMINOPHEN LEVEL: Acetaminophen (Tylenol), Serum: <10 ug/mL — ABNORMAL LOW (ref 10–30)

## 2017-07-24 NOTE — ED Triage Notes (Signed)
Per ems pt took a hit of methamphetamine tonight resulting in chest pain and "skin redness". Pt states has had multiple alcoholic drinks tonight. Pt appears in no acute distress. md at bedside.

## 2017-07-24 NOTE — ED Notes (Signed)
Report to kaley, rn.  

## 2017-07-24 NOTE — ED Notes (Signed)
Pt declines to leave blood pressure cuff in place at this time. Meal tray and po fluids provided with md consent.

## 2017-07-24 NOTE — ED Notes (Signed)
Pt continues to ring call bell for small things such as to complain about banana provided in meal tray. Attempted to explain to pt what call bell is for, pt states "I know, I know".

## 2017-07-24 NOTE — Discharge Instructions (Signed)
Avoid using methamphetamine and cocaine in the future. These drugs can cause serious health problems, including heart attacks, strokes, and kidney failure. You should cut back on drinking alcohol as well to avoid getting liver failure.

## 2017-07-24 NOTE — ED Provider Notes (Signed)
[facility] Emergency Department Provider Note  ____________________________________________  Time seen: Approximately 7:11 AM  I have reviewed the triage vital signs and the nursing notes.   HISTORY  Chief Complaint Chest Pain and Alcohol Intoxication    HPI Alan Eaton is a 42 y.o. male with a history of hypertension, diabetes, GERD who complains of chest pain in the center of his chest, described as tightness, nonradiating, no aggravating or alleviating factors. No associated shortness of breath vomiting or diaphoresis. Moderate intensity.  The patient reports that tonight he used methamphetamine and cocaine with a prostitute at about midnight. He also was drinking heavily. The chest pain started about an hour later.      Past Medical History:  Diagnosis Date  . Alcohol abuse   . Bronchitis   . Depression   . Diabetes mellitus without complication ([place])   . Drug abuse   . GERD (gastroesophageal reflux disease)   . Gout   . Hypertension   . Morbid obesity ([place])   . Suicide ideation      Patient Active Problem List   Diagnosis Date Noted  . Tachycardia 04/19/2016  . Factitious disorder 01/13/2016  . Unspecified depressive disorder 01/13/2016  . Alcohol use disorder, severe, dependence ([place]) 01/13/2016  . 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 HTN (hypertension) 01/05/2016  . Gout 01/05/2016  . GERD (gastroesophageal reflux disease) 01/05/2016  . Diabetes ([place]) 01/05/2016  . Tobacco use disorder 01/05/2016  . Obesity 10/27/2015     Past Surgical History:  Procedure Laterality Date  . none       Prior to Admission medications   Medication Sig Start Date End Date Taking? Authorizing Provider  allopurinol (ZYLOPRIM) 300 MG tablet Take 300 mg by mouth daily.    [provider]  amLODipine (NORVASC) 5 MG tablet Take 1 tablet (5 mg total) by mouth daily. 12/06/16   Mikey College, NP  aspirin EC 81 MG tablet Take 81 mg  by mouth daily.    [provider]  B Complex-C (B-COMPLEX WITH VITAMIN C) tablet Take 1 tablet by mouth daily.    [provider]  colchicine 0.6 MG tablet Take 1 tablet (0.6 mg total) by mouth daily as needed. 09/27/16   Mikey College, NP  dicyclomine (BENTYL) 10 MG capsule Take 1 capsule (10 mg total) by mouth 3 (three) times daily before meals. 09/27/16   Mikey College, NP  famotidine (PEPCID) 40 MG tablet TAKE 1 TABLET BY MOUTH BEDTIME FOR STOMACH ACID 04/22/16   Arlis Porta., MD  hydrOXYzine (ATARAX/VISTARIL) 25 MG tablet Take 1 tablet (25 mg total) 3 (three) times daily as needed by mouth for itching. 01/24/17   Mikey College, NP  levofloxacin (LEVAQUIN) 500 MG tablet Take 500 mg by mouth daily.    [provider]  losartan (COZAAR) 100 MG tablet Take 1 tablet (100 mg total) by mouth daily. 09/27/16   Mikey College, NP  meclizine (ANTIVERT) 25 MG tablet Take 1 tablet (25 mg total) by mouth 3 (three) times daily as needed for dizziness or nausea. 09/27/16   Mikey College, NP  meloxicam (MOBIC) 7.5 MG tablet Take 7.5 mg by mouth daily.    [provider]  metoprolol (TOPROL-XL) 200 MG 24 hr tablet Take 1 tablet (200 mg total) by mouth daily. Take with or immediately following a meal. 09/27/16   Mikey College, NP  Multiple Vitamin (MULTIVITAMIN WITH MINERALS) TABS tablet Take 1 tablet  by mouth daily.    [provider]  omeprazole (PRILOSEC) 20 MG capsule Take 1 capsule (20 mg total) by mouth daily. 09/27/16   Mikey College, NP  predniSONE (DELTASONE) 20 MG tablet 4 days take 3 pills. For 4 days take 2 pills, for 4 days take 1 pill, then 2 days, take 1/2 pill. 09/27/16   Mikey College, NP  promethazine (PHENERGAN) 25 MG tablet Take 1 tablet (25 mg total) by mouth every 6 (six) hours as needed for nausea or vomiting. 09/14/16   Mikey College, NP  traZODone (DESYREL) 100 MG tablet  07/24/16    [provider]  VENTOLIN HFA 108 (90 Base) MCG/ACT inhaler INHALE 2 PUFFS BY MOUTH EVERY 4 TO 6 HOURS AS NEEDED. 11/18/15   Arlis Porta., MD     Allergies Bee venom and Bactrim [sulfamethoxazole-trimethoprim]   Family History  Problem Relation Age of Onset  . Hypertension Unknown   . Diabetes Mellitus II Unknown     Social History Social History   Tobacco Use  . Smoking status: Current Every Day Smoker    Packs/day: 1.00    Types: Cigarettes  . Smokeless tobacco: Never Used  Substance Use Topics  . Alcohol use: Yes    Alcohol/week: 0.0 oz    Comment: 14 beers today  . Drug use: Yes    Frequency: 1.0 times per week    Types: Marijuana, Cocaine    Comment: last used cocaine Monday; last smoked marijuana monday    Review of Systems  Constitutional:   No fever or chills.  ENT:   No sore throat. No rhinorrhea. Cardiovascular:   positive as above chest pain syncope. Respiratory:   No dyspnea or cough. Gastrointestinal:   Negative for abdominal pain, vomiting and diarrhea.  Musculoskeletal:   Negative for focal pain or swelling All other systems reviewed and are negative except as documented above in ROS and HPI.  ____________________________________________   PHYSICAL EXAM:  VITAL SIGNS: ED Triage Vitals  Enc Vitals Group     BP 07/24/17 0442 (!) 161/107     Pulse Rate 07/24/17 0440 (!) 111     Resp 07/24/17 0440 14     Temp --      Temp Source 07/24/17 0440 Oral     SpO2 07/24/17 0440 96 %     Weight 07/24/17 0441 225 lb (102.1 kg)     Height 07/24/17 0441 6\' 1"  (1.854 m)     Head Circumference --      Peak Flow --      Pain Score 07/24/17 0441 7     Pain Loc --      Pain Edu? --      Excl. in [place]? --     Vital signs reviewed, nursing assessments reviewed.   Constitutional:   Alert and oriented. Well appearing and in no distress. Eyes:   Conjunctivae are normal. EOMI. PERRL. ENT      Head:   Normocephalic and atraumatic.      Nose:    No congestion/rhinnorhea.       Mouth/Throat:   MMM, no pharyngeal erythema. No peritonsillar mass.       Neck:   No meningismus. Full ROM. Hematological/Lymphatic/Immunilogical:   No cervical lymphadenopathy. Cardiovascular:   RRR. Symmetric bilateral radial and DP pulses.  No murmurs.  Respiratory:   Normal respiratory effort without tachypnea/retractions. Breath sounds are clear and equal bilaterally.mild expiratory wheezing. Gastrointestinal:   Soft and nontender.  Non distended. There is no CVA tenderness.  No rebound, rigidity, or guarding. Musculoskeletal:   Normal range of motion in all extremities. No joint effusions.  No lower extremity tenderness.  No edema. Neurologic:   Normal speech and language.  Motor grossly intact. No acute focal neurologic deficits are appreciated.  Skin:    Skin is warm, dry and intact. No rash noted.  No petechiae, purpura, or bullae.  ____________________________________________    LABS (pertinent positives/negatives) (all labs ordered are listed, but only abnormal results are displayed) Labs Reviewed  BASIC METABOLIC PANEL - Abnormal; Notable for the following components:      Result Value   Calcium 8.8 (*)    All other components within normal limits  CBC WITH DIFFERENTIAL/PLATELET - Abnormal; Notable for the following components:   Monocytes Absolute 1.1 (*)    All other components within normal limits  URINALYSIS, COMPLETE (UACMP) WITH MICROSCOPIC - Abnormal; Notable for the following components:   Color, Urine YELLOW (*)    APPearance CLEAR (*)    Protein, ur 100 (*)    All other components within normal limits  ACETAMINOPHEN LEVEL - Abnormal; Notable for the following components:   Acetaminophen (Tylenol), Serum <10 (*)    All other components within normal limits  ETHANOL - Abnormal; Notable for the following components:   Alcohol, Ethyl (B) 241 (*)    All other components within normal limits  TROPONIN I  URINE DRUG SCREEN,  QUALITATIVE ([facility] ONLY)  SALICYLATE LEVEL  TROPONIN I   ____________________________________________   EKG  interpreted by me Sinus tachycardia rate 108, normal axis intervals QRS ST segments and T waves.  ____________________________________________    RADIOLOGY  No results found.  ____________________________________________   PROCEDURES Procedures  ____________________________________________  DIFFERENTIAL DIAGNOSIS   alcohol intoxication, stimulant side effect, non-STEMI  CLINICAL IMPRESSION / ASSESSMENT AND PLAN / ED COURSE  Pertinent labs & imaging results that were available during my care of the patient were reviewed by me and considered in my medical decision making (see chart for details).      Clinical Course as of Jul 24 709  Sun Jul 24, 2017  0449 P/w central chest pain after using methamphetamine and cocaine with alcohol around 12:00-1:00am. Not in distress. Will check labs, need a 7:00am troponin for medical clearance.    [PS]    Clinical Course User Index [PS] Carrie Mew, MD    ----------------------------------------- 7:14 AM on 07/24/2017 -----------------------------------------  Vital signs remained unremarkable, still mildly tachycardic. Labs unremarkable. Second troponin pending, if negative patient can be discharged home. Patient counseled to avoid drugs, alcohol, and prostitutes in the future.   ____________________________________________   FINAL CLINICAL IMPRESSION(S) / ED DIAGNOSES    Final diagnoses:  Alcoholic intoxication without complication ([place])  Polysubstance abuse [facility])     ED Discharge Orders    None      Portions of this note were generated with dragon dictation software. Dictation errors may occur despite best attempts at proofreading.    Carrie Mew, MD 07/24/17 [phone]

## 2017-07-24 NOTE — ED Notes (Signed)
Pt states he spilled full urinal on floor. Floor cleansed by this rn. Pt informed will need urine sample, clean urinal placed at bedside.

## 2017-07-24 NOTE — ED Notes (Signed)
Pt repeatedly ringing call bell for small requests. Matt, rn in to speak with pt.

## 2017-08-11 IMAGING — CR DG CHEST 2V
1 series · 2 of 2 positions shown · non-contrast
Comparison: 01/26/2014

CLINICAL DATA: Left upper extremity pain radiating into the chest.

EXAM:
CHEST  2 VIEW

[Series 1: dg chest 2 view · 0.14mm/px · 2 of 2 slices shown]
[im 1/2]
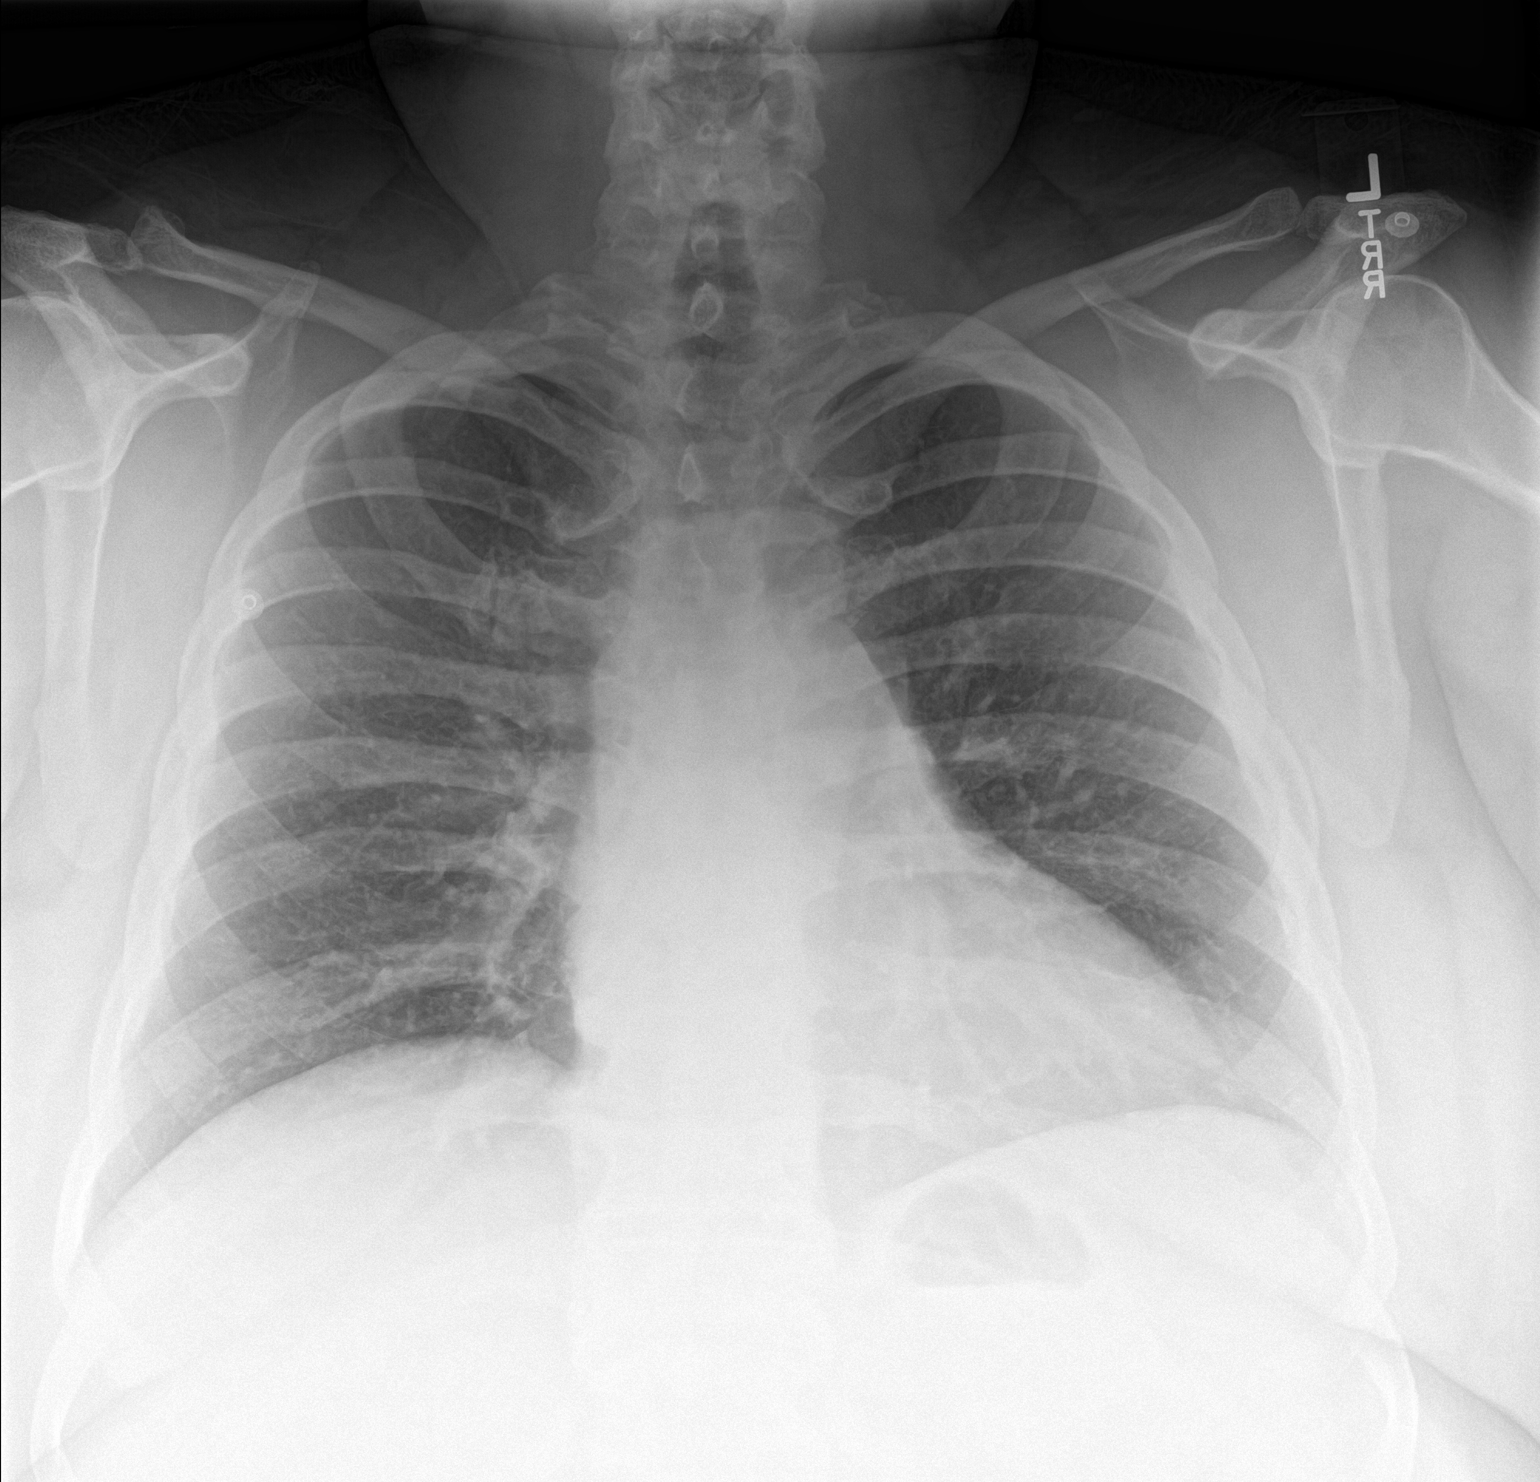
[im 2/2]
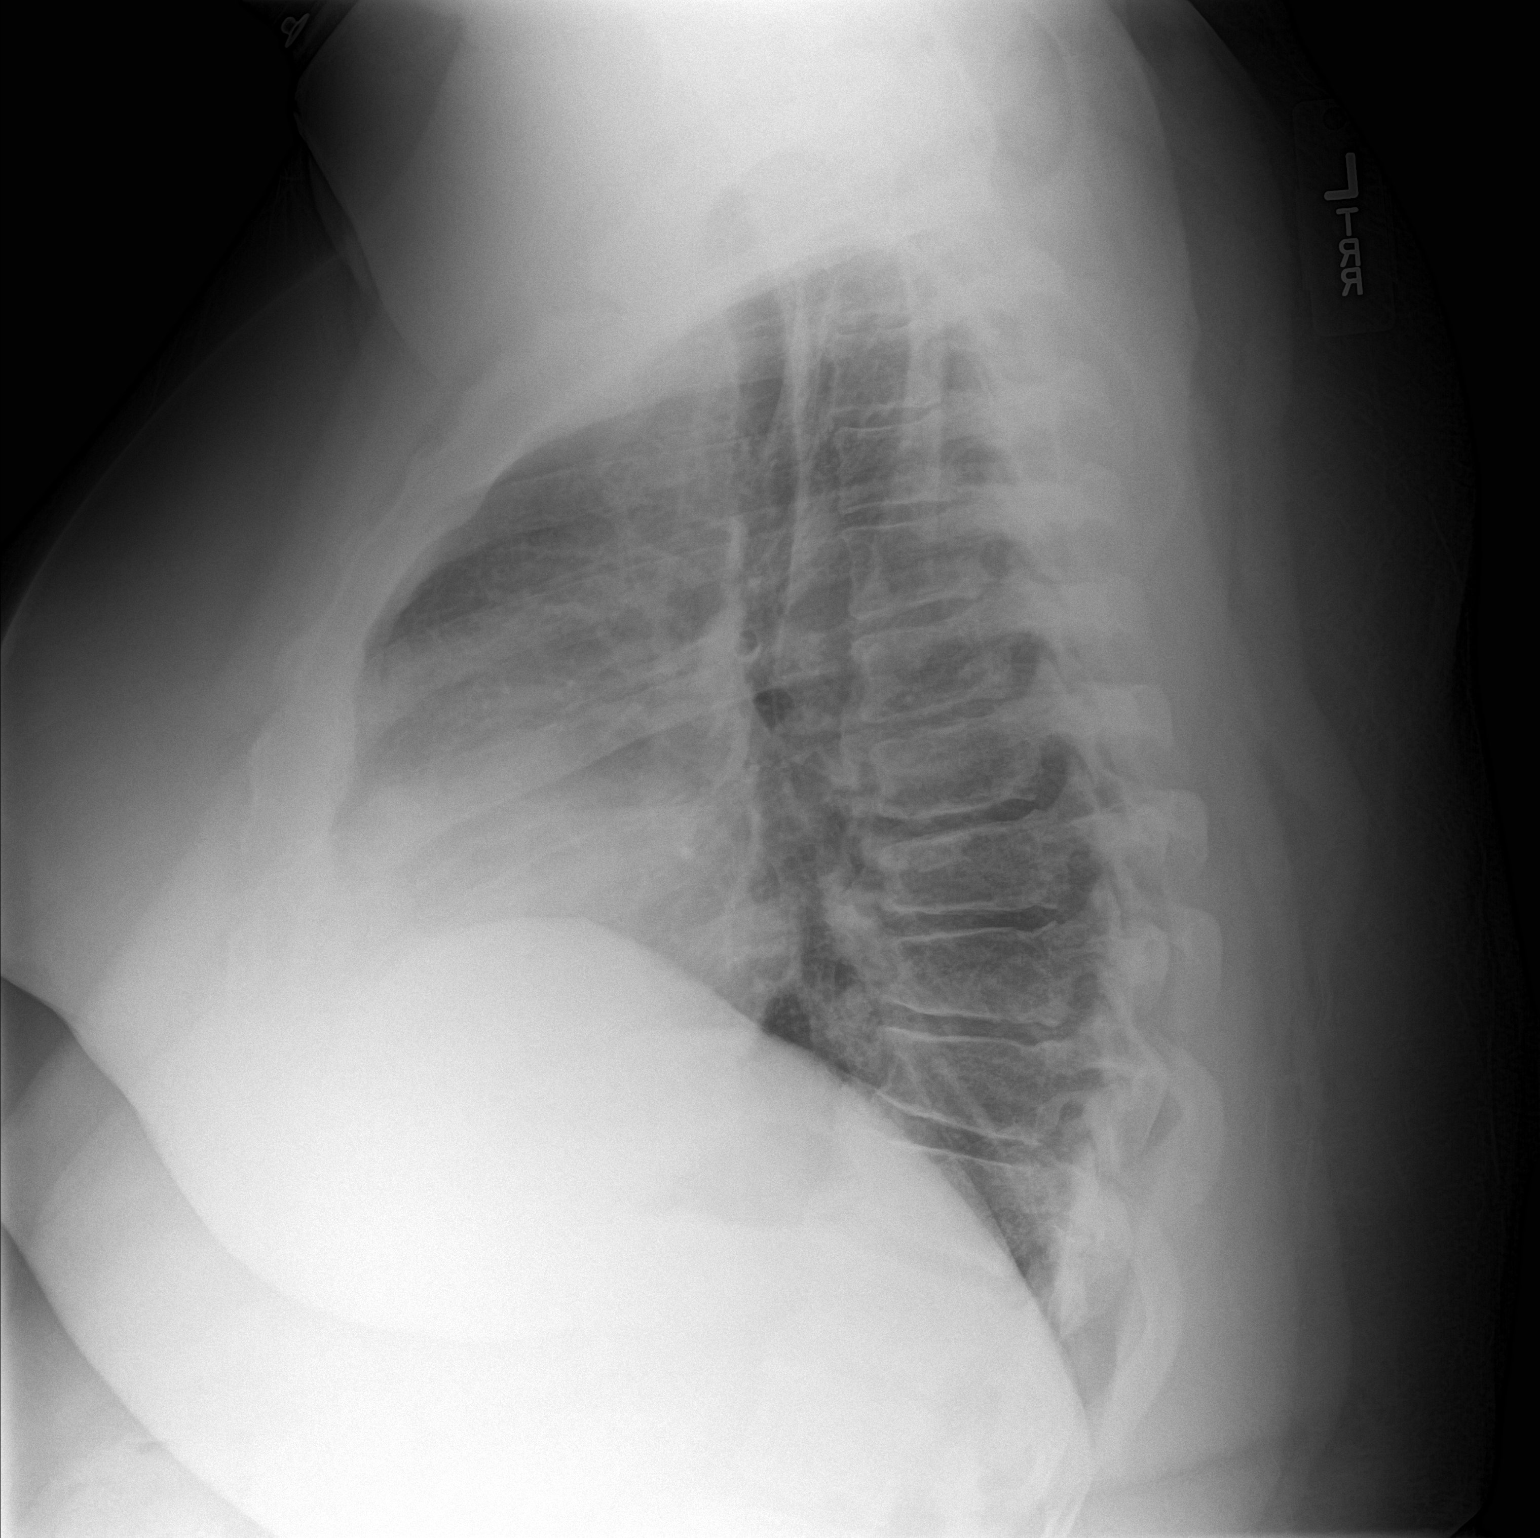

[2 of 2 positions shown; findings below may reference images not displayed]

FINDINGS: The heart size and mediastinal contours are within normal limits.
Both lungs are clear. The visualized skeletal structures are
unremarkable.
IMPRESSION: No active cardiopulmonary disease.

## 2017-08-18 ENCOUNTER — Other Ambulatory Visit: Payer: Self-pay | Admitting: Nurse Practitioner

## 2017-08-18 DIAGNOSIS — F419 Anxiety disorder, unspecified: Secondary | ICD-10-CM

## 2017-08-18 DIAGNOSIS — I10 Essential (primary) hypertension: Secondary | ICD-10-CM

## 2017-12-03 ENCOUNTER — Other Ambulatory Visit: Payer: Self-pay | Admitting: Nurse Practitioner

## 2017-12-03 DIAGNOSIS — F419 Anxiety disorder, unspecified: Secondary | ICD-10-CM

## 2017-12-03 DIAGNOSIS — I10 Essential (primary) hypertension: Secondary | ICD-10-CM

## 2018-03-16 ENCOUNTER — Telehealth: Payer: Self-pay | Admitting: Nurse Practitioner

## 2018-03-16 NOTE — Telephone Encounter (Signed)
Tried to call to schedule AWV-S.  Line was busy.   Last AWV, per [facility] 06/12/14.  lec

## 2018-03-20 NOTE — Telephone Encounter (Signed)
The pt have not been seen in our office since 09/2016. We was notified that the patient moved to [place]. The patient is also aware that he must be seen in the office prior to any medication refills.

## 2018-03-20 NOTE — Telephone Encounter (Addendum)
Per message from [place], [place], they are aware patient moved to [place] and she noted about his medication refills.

## 2018-03-20 NOTE — Telephone Encounter (Signed)
Tried to reach patient at [phone] to schedule AWV; rang busy.  Called his Juliane Poot at [phone] since she was on his DPR.  His Mother states Taiki has moved to [place].  She started talking about his medications and not sure if he is needing refills.  At this time, I advised the Mother, Melburn Popper to call back to [facility] at [phone] and discuss with the staff the medication issue and that Alvy has moved to [place].  .  She also gave an updated phone number for Oisin Yoakum, which is [phone].  Told Mom to make sure she gives the updated phone number to the office staff, as well.  I will not schedule AWV since Mom states patient has moved to [place]. [place]

## 2018-05-16 IMAGING — US US SCROTUM
1 series · 13 of 25 positions shown · non-contrast
Comparison: None.

CLINICAL DATA: 39-year-old male with testicular swelling

EXAM:
SCROTAL ULTRASOUND
DOPPLER ULTRASOUND OF THE TESTICLES
TECHNIQUE: Complete ultrasound examination of the testicles, epididymis, and
other scrotal structures was performed. Color and spectral Doppler
ultrasound were also utilized to evaluate blood flow to the
testicles.

[Series 1: us scrotum · 0.11mm/px · 13 of 58 slices shown]
[im 1/58]
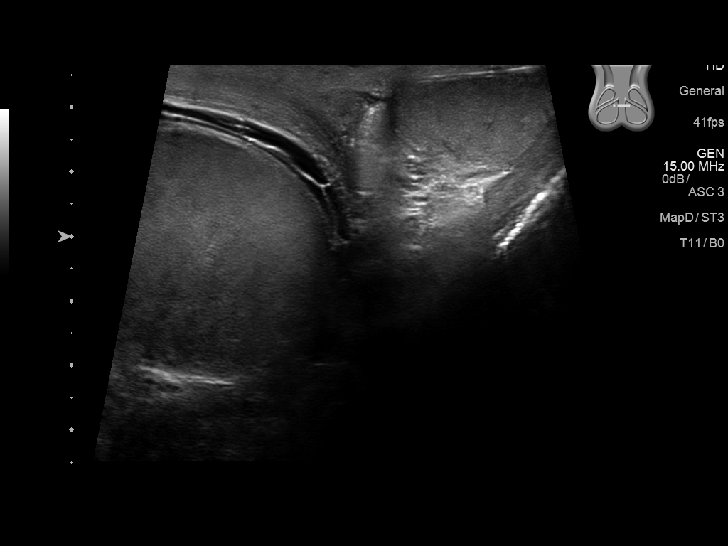
[im 5/58]
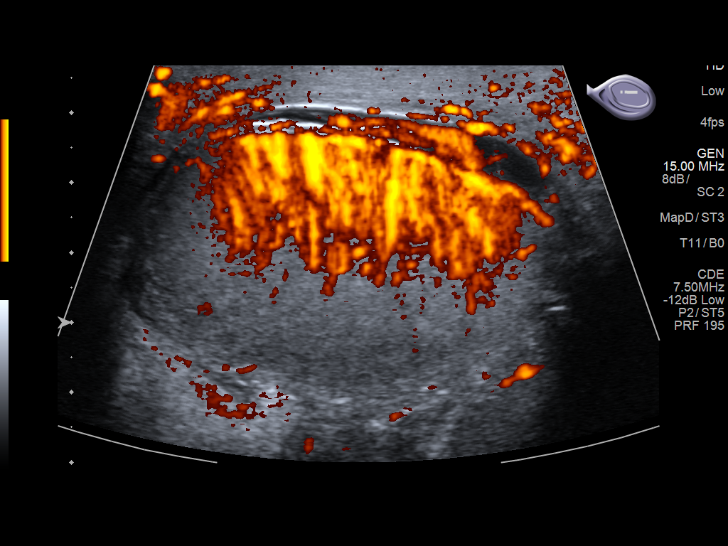
[im 10/58]
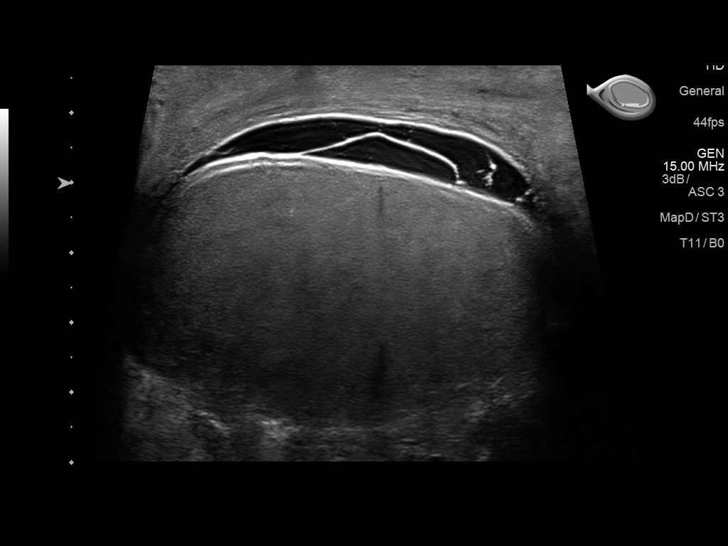
[im 15/58]
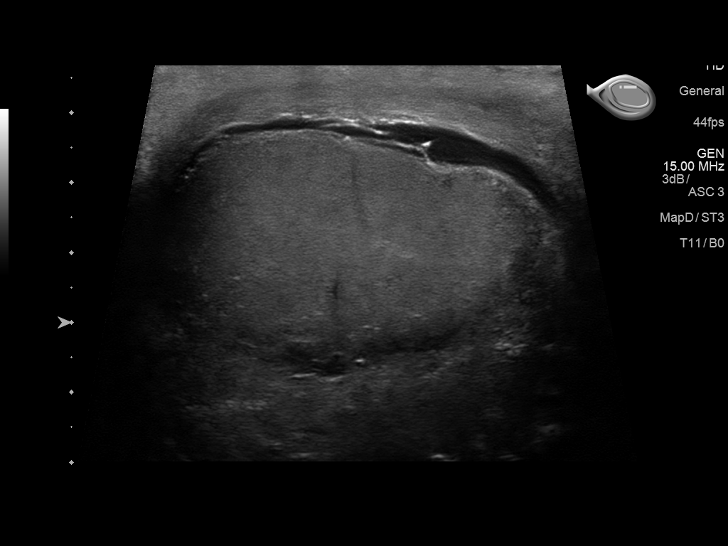
[im 20/58]
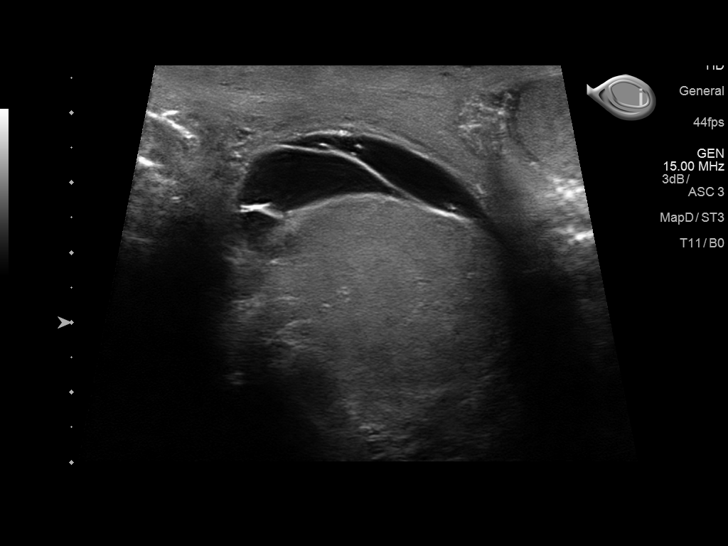
[im 24/58]
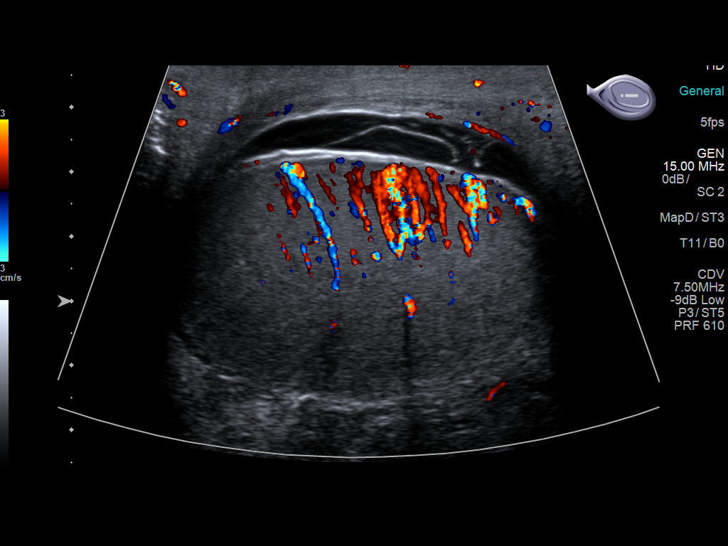
[im 29/58]
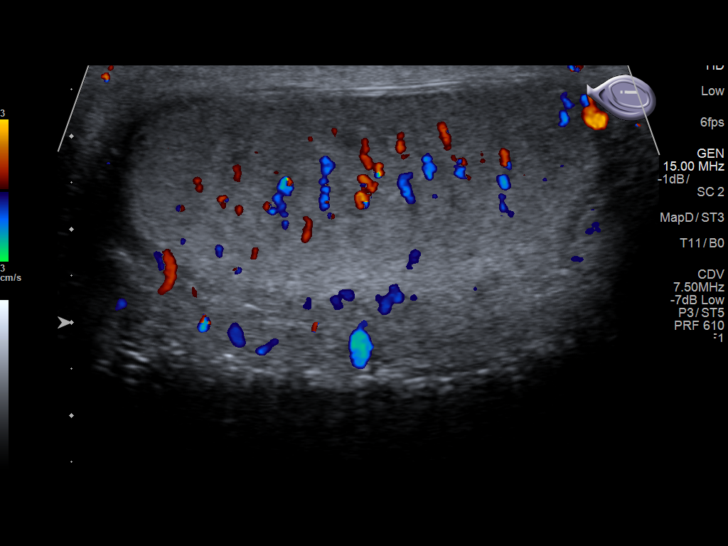
[im 34/58]
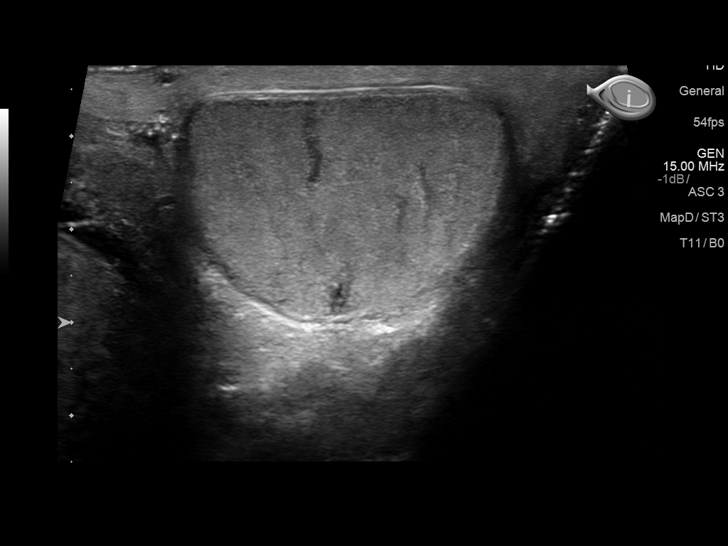
[im 39/58]
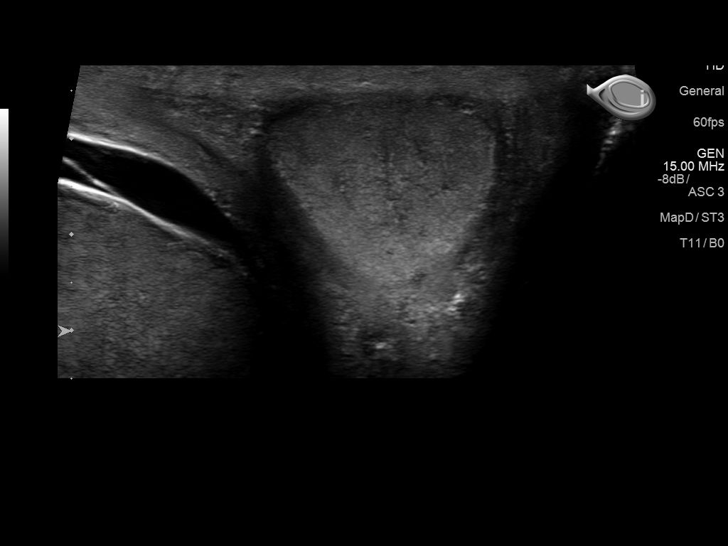
[im 43/58]
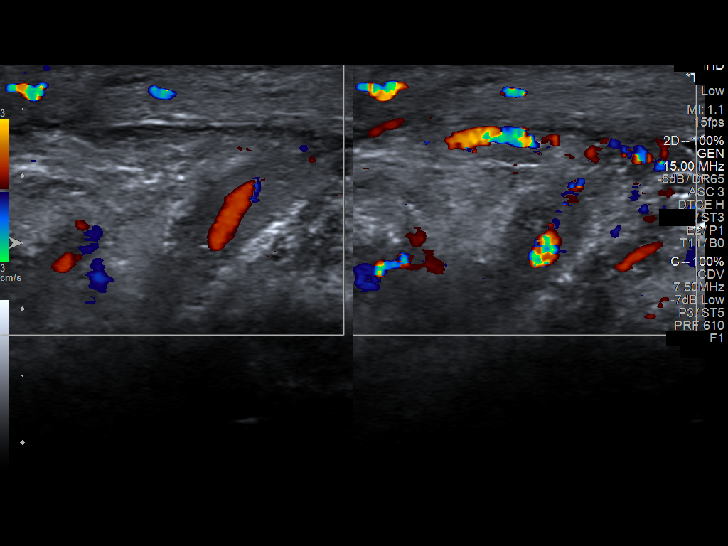
[im 48/58]
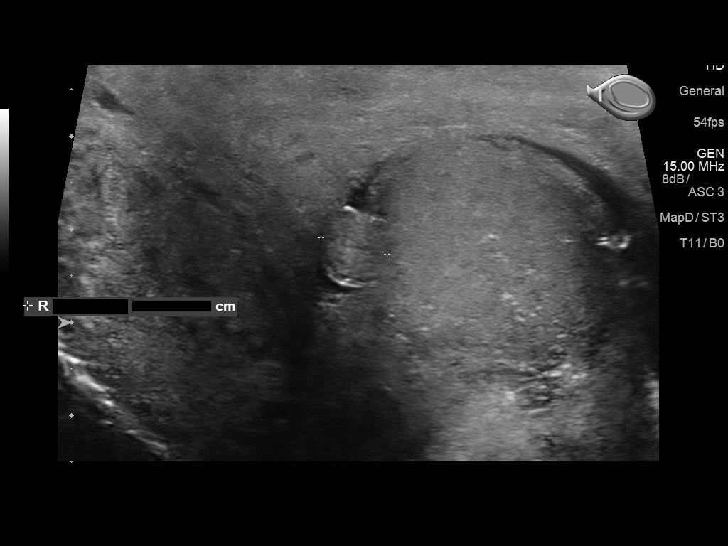
[im 53/58]
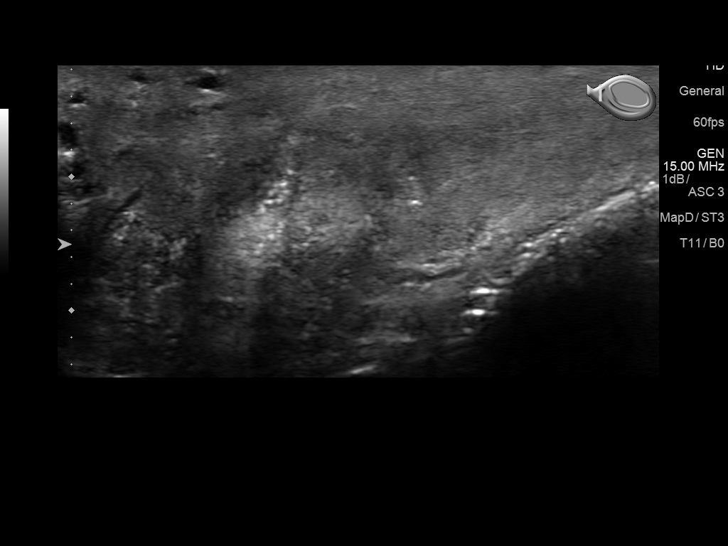
[im 58/58]
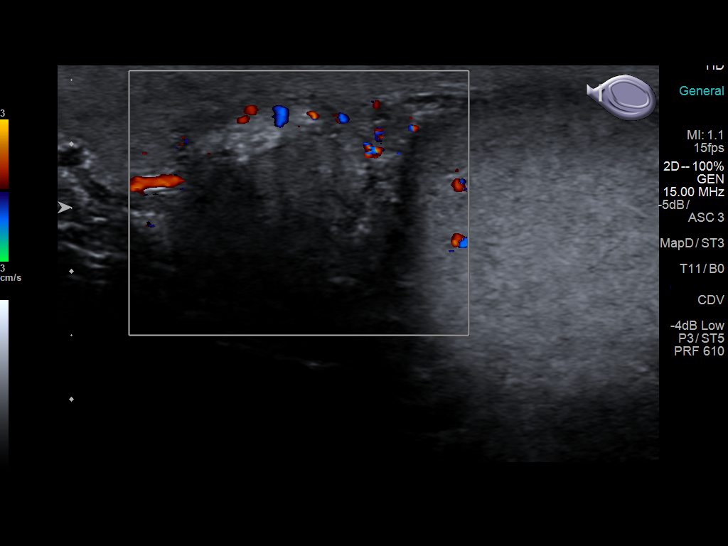

[13 of 25 positions shown; findings below may reference images not displayed]

FINDINGS: Right testicle

Measurements: 6.2 x 3.6 x 4.4 cm. No mass visualized. The testicular
parenchyma is markedly hypervascular and mildly heterogeneous.

Left testicle

Measurements: 5.6 x 2.8 x 3.4 cm. No mass or microlithiasis
visualized.

Right epididymis: Normal in size and appearance. Suggestion of a
small 4 x 2 x 2 mm hypoechoic lesion within the epididymal body.

Left epididymis:  Normal in size and appearance.

Hydrocele:  Small right hydrocele with internal septations.

Varicocele:  None visualized.

Pulsed Doppler interrogation of both testes demonstrates normal low
resistance arterial and venous waveforms bilaterally.
IMPRESSION: 1. Hypervascular and mildly heterogeneous right testicle concerning
for acute orchitis.
2. Small but mildly complex right-sided hydrocele likely reactive
and related to the underlying orchitis.
3. Small 4 x 2 mm hypoechoic structure within the right epididymal
body has a nonspecific appearance. This may represent a small solid
lesion such as an adenomatoid tumor, or a complex epididymal cyst.
Abscess is considered less likely given the absence of significant
edema and hypervascularity within the epididymis. Recommend
follow-up testicular ultrasound in 4-6 weeks to assess for
stability.

## 2018-07-30 ENCOUNTER — Other Ambulatory Visit: Payer: Self-pay

## 2018-07-30 ENCOUNTER — Emergency Department
Admission: EM | Admit: 2018-07-30 | Discharge: 2018-07-30 | Disposition: A | Discharge status: Home / Self Care | Payer: Medicare Other | Attending: Emergency Medicine | Admitting: Emergency Medicine

## 2018-07-30 ENCOUNTER — Encounter: Payer: Self-pay | Admitting: Emergency Medicine

## 2018-07-30 DIAGNOSIS — F10920 Alcohol use, unspecified with intoxication, uncomplicated: Secondary | ICD-10-CM

## 2018-07-30 DIAGNOSIS — E119 Type 2 diabetes mellitus without complications: Secondary | ICD-10-CM | POA: Diagnosis not present

## 2018-07-30 DIAGNOSIS — I10 Essential (primary) hypertension: Secondary | ICD-10-CM | POA: Diagnosis not present

## 2018-07-30 DIAGNOSIS — R45851 Suicidal ideations: Secondary | ICD-10-CM | POA: Diagnosis not present

## 2018-07-30 DIAGNOSIS — F1910 Other psychoactive substance abuse, uncomplicated: Secondary | ICD-10-CM | POA: Diagnosis not present

## 2018-07-30 DIAGNOSIS — F331 Major depressive disorder, recurrent, moderate: Secondary | ICD-10-CM | POA: Diagnosis present

## 2018-07-30 DIAGNOSIS — F17210 Nicotine dependence, cigarettes, uncomplicated: Secondary | ICD-10-CM | POA: Insufficient documentation

## 2018-07-30 LAB — URINE DRUG SCREEN, QUALITATIVE (ARMC ONLY)
Amphetamines, Ur Screen: NOT DETECTED
Barbiturates, Ur Screen: NOT DETECTED
Benzodiazepine, Ur Scrn: NOT DETECTED
Cannabinoid 50 Ng, Ur ~~LOC~~: NOT DETECTED
Cocaine Metabolite,Ur ~~LOC~~: NOT DETECTED
MDMA (Ecstasy)Ur Screen: NOT DETECTED
Methadone Scn, Ur: NOT DETECTED
Opiate, Ur Screen: NOT DETECTED
Phencyclidine (PCP) Ur S: NOT DETECTED
Tricyclic, Ur Screen: NOT DETECTED

## 2018-07-30 LAB — CBC
HCT: 38.9 % — ABNORMAL LOW (ref 39.0–52.0)
Hemoglobin: 13.2 g/dL (ref 13.0–17.0)
MCH: 29.8 pg (ref 26.0–34.0)
MCHC: 33.9 g/dL (ref 30.0–36.0)
MCV: 87.8 fL (ref 80.0–100.0)
Platelets: 232 10*3/uL (ref 150–400)
RBC: 4.43 MIL/uL (ref 4.22–5.81)
RDW: 13.4 % (ref 11.5–15.5)
WBC: 9.4 10*3/uL (ref 4.0–10.5)
nRBC: 0 % (ref 0.0–0.2)

## 2018-07-30 LAB — ETHANOL: Alcohol, Ethyl (B): 163 mg/dL — ABNORMAL HIGH (ref <10)

## 2018-07-30 LAB — COMPREHENSIVE METABOLIC PANEL
ALT: 23 U/L (ref 0–44)
AST: 22 U/L (ref 15–41)
Albumin: 4.1 g/dL (ref 3.5–5.0)
Alkaline Phosphatase: 50 U/L (ref 38–126)
Anion gap: 9 (ref 5–15)
BUN: 13 mg/dL (ref 6–20)
CO2: 22 mmol/L (ref 22–32)
Calcium: 8.8 mg/dL — ABNORMAL LOW (ref 8.9–10.3)
Chloride: 105 mmol/L (ref 98–111)
Creatinine, Ser: 1.13 mg/dL (ref 0.61–1.24)
GFR calc Af Amer: >60 mL/min (ref >60)
GFR calc non Af Amer: >60 mL/min (ref >60)
Glucose, Bld: 87 mg/dL (ref 70–99)
Potassium: 3.7 mmol/L (ref 3.5–5.1)
Sodium: 136 mmol/L (ref 135–145)
Total Bilirubin: 0.5 mg/dL (ref 0.3–1.2)
Total Protein: 7.4 g/dL (ref 6.5–8.1)

## 2018-07-30 LAB — ACETAMINOPHEN LEVEL: Acetaminophen (Tylenol), Serum: <10 ug/mL — ABNORMAL LOW (ref 10–30)

## 2018-07-30 LAB — SALICYLATE LEVEL: Salicylate Lvl: <7 mg/dL (ref 2.8–30.0)

## 2018-07-30 NOTE — ED Provider Notes (Signed)
Patient no longer wants to be in the hospital, states he does not want to hurt himself any longer and no longer appears intoxicated.  He is cleared for outpatient follow-up.   Earleen Newport, MD 07/30/18 2208

## 2018-07-30 NOTE — ED Notes (Signed)
1 cell phone, 1 pair brown sandals, 1 pair blue shorts, 1 grey shirt, 1 pair boxers, 1 green rubber bracelet, 1 yellow shiny chain.

## 2018-07-30 NOTE — ED Notes (Signed)
Report to include Situation, Background, Assessment, and Recommendations received from [facility]. Patient alert and oriented, warm and dry, in no acute distress. Patient denies HI, AVH and pain. Patient made aware of Q15 minute rounds and Engineer, drilling presence for their safety. Patient instructed to come to me with needs or concerns.

## 2018-07-30 NOTE — ED Notes (Signed)
Spoke with patients mother Alan Eaton  Medications from [facility] in [place]  Metoprolol ER 25mg  daily  Protonix 40 mg 1 tab daily  Promethazine 25 mg twice a day  Procardia ER 60 mg daily  Hydroxyzine HCI 50 mg twice a day  Vivitrol injection   Per patients mother

## 2018-07-30 NOTE — ED Notes (Signed)
Hourly rounding reveals patient in room. Stable, in no acute distress. Q15 minute rounds and monitoring via Rover and Officer to continue.  

## 2018-07-30 NOTE — ED Triage Notes (Signed)
Pt presents to ED via POV with c/o thoughts of hurting himself. Pt states "i've been through a lot since October with my dad". Pt states "I am just gonna take a knife and take myself on out". Pt endorses using ETOH today, endorses 4-5 12 oz beers today. Pt endorses external stressors. Pt endorses his father as being a reason to live. Pt is alert and oriented, calm and cooperative in triage.

## 2018-07-30 NOTE — ED Notes (Signed)
Patient assigned to appropriate care area   Introduced self to pt  Patient oriented to unit/care area: Informed that, for their safety, care areas are designed for safety and visiting and phone hours explained to patient. Patient verbalizes understanding, and verbal contract for safety obtained  Environment secured  Pt talking loudly  - appears intoxicated    Pt presents to ED via POV with c/o thoughts of hurting himself. Pt states "i've been through a lot since October with my dad". Pt states "I am just gonna take a knife and take myself on out". Pt endorses using ETOH today, endorses 4-5 12 oz beers today. Pt endorses external stressors. Pt endorses his father as being a reason to live. Pt is alert and oriented, calm and cooperative in triage.

## 2018-07-30 NOTE — ED Notes (Signed)
Hourly rounding reveals patient in room. No complaints, stable, in no acute distress. Q15 minute rounds and monitoring via Rover and Officer to continue.   

## 2018-07-30 NOTE — ED Notes (Signed)
Patient requesting to go home.

## 2018-07-30 NOTE — ED Notes (Signed)
Patient talking to mother [name] and he appeared to be upset with her called her bossy and controlling.

## 2018-07-30 NOTE — BH Assessment (Signed)
Assessment Note  Alan Eaton is an 43 y.o. male. Alan Eaton arrived to the ED by way of personal transportation by a friend.  He reports that he came to the ED tonight due to "Not feeling right and suicidal".  Alan Eaton reports that he is not feeling right, but could not describe what he was feeling or how he felt different.  Alan Eaton was asked if he felt sad, and he stated "Yes".  Alan Eaton denied symptoms of depression.  He denied symptoms of anxiety.  He denied having auditory or visual hallucinations.  Alan Eaton denied wanting to harm himself. He denied homicidal ideation or intent.  He denied that he wants to kill himself.  Reports upon arrival was that Alan Eaton had a plan to kill himself with a knife.  Alan Eaton reports that he has been under stress from his father being in the hospital since October.  He further reports that he is also under stress from wanting to go back to [place] and having to stay in [place].  He denied the use of alcohol or drugs.  When questioned a second time, He reports that he has been drinking less that he usually does.  Patient stated that he is ready to go home at this time.  He stated. "Can't you see how calm I am now. When are you gonna let me go home.   Diagnosis: Substance Abuse, Depression  Past Medical History:  Past Medical History:  Diagnosis Date  . Alcohol abuse   . Bronchitis   . Depression   . Diabetes mellitus without complication ([place])   . Drug abuse ([place])   . GERD (gastroesophageal reflux disease)   . Gout   . Hypertension   . Morbid obesity ([place])   . Suicide ideation     Past Surgical History:  Procedure Laterality Date  . none      Family History:  Family History  Problem Relation Age of Onset  . Hypertension Unknown   . Diabetes Mellitus II Unknown     Social History:  reports that he has been smoking cigarettes. He has been smoking about 1.00 pack per day. He has never used smokeless tobacco. He  reports current alcohol use. He reports current drug use. Frequency: 1.00 time per week. Drugs: Marijuana and Cocaine.  Additional Social History:  Alcohol / Drug Use History of alcohol / drug use?: Yes(Did not provide additional information about alcohol or drug use)  CIWA: CIWA-Ar BP: 119/83 Pulse Rate: 99 COWS:    Allergies:  Allergies  Allergen Reactions  . Bee Venom Swelling  . Bactrim [Sulfamethoxazole-Trimethoprim] Swelling and Rash    Home Medications: (Not in a hospital admission)   OB/GYN Status:  No LMP for male patient.  General Assessment Data Location of Assessment: [facility] ED TTS Assessment: In system Is this a Tele or Face-to-Face Assessment?: Face-to-Face Is this an Initial Assessment or a Re-assessment for this encounter?: Initial Assessment Patient Accompanied by:: N/A Language Other than English: No Living Arrangements: Other (Comment)(Private residence) What gender do you identify as?: Male Marital status: Single Pregnancy Status: No Living Arrangements: Parent Can pt return to current living arrangement?: Yes Admission Status: Voluntary Is patient capable of signing voluntary admission?: Yes Referral Source: Self/Family/Friend Insurance type: Medicare, Medicaid  Medical Screening Exam ([place]) Medical Exam completed: Yes  Crisis Care Plan Living Arrangements: Parent Legal Guardian: Other:(Self) Name of Psychiatrist: None Name of Therapist: None  Education Status Is patient currently  in school?: No Is the patient employed, 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 : No Suicidal Intent: No-Not Currently/Within Last 6 Months(Patient denied) Has patient had any suicidal intent within the past 6 months prior to admission? : No(Patient denied states, "I  just need to get away from here") Is patient at risk for suicide?: No, but patient needs Medical Clearance Suicidal Plan?: Yes-Currently Present Has patient had any suicidal plan within the past 6 months prior to admission? : Yes Specify Current Suicidal Plan: Cut himself with a knife Access to Means: Yes Specify Access to Suicidal Means: Access to knives What has been your use of drugs/alcohol within the last 12 months?: Use of alcohol Previous Attempts/Gestures: Yes How many times?: 2 Other Self Harm Risks: Denied Triggers for Past Attempts: Unknown Intentional Self Injurious Behavior: None Family Suicide History: No Recent stressful life event(s): Other (Comment)(Family health) Persecutory voices/beliefs?: No Depression: No Depression Symptoms: (denied by patient) Substance abuse history and/or treatment for substance abuse?: Yes Suicide prevention information given to non-admitted patients: Not applicable  Risk to Others within the past 6 months Homicidal Ideation: No Does patient have any lifetime risk of violence toward others beyond the six months prior to admission? : No Thoughts of Harm to Others: No Current Homicidal Intent: No Current Homicidal Plan: No Access to Homicidal Means: No Identified Victim: None identified History of harm to others?: No Assessment of Violence: None Noted Violent Behavior Description: denied Does patient have access to weapons?: No Criminal Charges Pending?: No Does patient have a court date: No Is patient on probation?: No  Psychosis Hallucinations: None noted Delusions: None noted  Mental Status Report Appearance/Hygiene: In scrubs Eye Contact: Fair Motor Activity: Unremarkable Speech: Logical/coherent Level of Consciousness: Alert Mood: Euthymic Affect: Appropriate to circumstance Anxiety Level: None Thought Processes: Coherent Judgement: Partial Orientation: Appropriate for developmental age Obsessive Compulsive  Thoughts/Behaviors: None  Cognitive Functioning Concentration: Good Memory: Recent Intact Is patient IDD: No Insight: Poor Impulse Control: Poor Appetite: Fair Have you had any weight changes? : No Change Sleep: Decreased Vegetative Symptoms: None  ADLScreening [facility] Assessment Services) Patient's cognitive ability adequate to safely complete daily activities?: Yes Patient able to express need for assistance with ADLs?: Yes Independently performs ADLs?: Yes (appropriate for developmental age)  Prior Inpatient Therapy Prior Inpatient Therapy: Yes Prior Therapy Dates: "Years ago" Prior Therapy Facilty/Provider(s): in [place] Reason for Treatment: Depression  Prior Outpatient Therapy Prior Outpatient Therapy: Yes Prior Therapy Dates: "Years ago" Prior Therapy Facilty/Provider(s): [facility] Reason for Treatment: Depression, Alcohol Does patient have an ACCT team?: No Does patient have Intensive In-House Services?  : No Does patient have [facility] services? : No Does patient have P4CC services?: No  ADL Screening (condition at time of admission) Patient's cognitive ability adequate to safely complete daily activities?: Yes Is the patient deaf or have difficulty hearing?: No Does the patient have difficulty seeing, even when wearing glasses/contacts?: No Does the patient have difficulty concentrating, remembering, or making decisions?: No Patient able to express need for assistance with ADLs?: Yes Does the patient have difficulty dressing or bathing?: No Independently performs ADLs?: Yes (appropriate for developmental age) Does the patient have difficulty walking or climbing stairs?: No Weakness of Legs: None Weakness of Arms/Hands: Right  Home Assistive Devices/Equipment Home Assistive Devices/Equipment: None    Abuse/Neglect Assessment (Assessment to  be complete while patient is alone) Abuse/Neglect Assessment Can Be Completed: Yes(Patient stated "I don't want to talk  about it")     Advance Directives (For Healthcare) Does Patient Have a Medical Advance Directive?: No Would patient like information on creating a medical advance directive?: No - Patient declined          Disposition:  Disposition Initial Assessment Completed for this Encounter: Yes  On Site Evaluation by:   Reviewed with Physician:    Elmer Bales 07/30/2018 9:17 PM

## 2018-07-30 NOTE — ED Notes (Signed)
Hourly rounding reveals patient sleeping in room. No complaints, stable, in no acute distress. Q15 minute rounds and monitoring via Rover and Officer to continue.  

## 2018-07-30 NOTE — ED Provider Notes (Signed)
[facility] Emergency Department Provider Note       Time seen: ----------------------------------------- 6:34 PM on 07/30/2018 -----------------------------------------   I have reviewed the triage vital signs and the nursing notes.  HISTORY   Chief Complaint Psychiatric Evaluation    HPI Alan Eaton is a 43 y.o. male with a history of alcohol abuse, depression, diabetes, drug abuse, GERD, gout, hypertension, obesity who presents to the ED for thoughts of hurting himself.  Patient states he has been through a lot since October with his father.  He states he was contacted knife and take himself out he reports drinking probably a case of beer today.  Past Medical History:  Diagnosis Date  . Alcohol abuse   . Bronchitis   . Depression   . Diabetes mellitus without complication ([place])   . Drug abuse ([place])   . GERD (gastroesophageal reflux disease)   . Gout   . Hypertension   . Morbid obesity ([place])   . Suicide ideation     Patient Active Problem List   Diagnosis Date Noted  . Tachycardia 04/19/2016  . Factitious disorder 01/13/2016  . Unspecified depressive disorder 01/13/2016  . Alcohol use disorder, severe, dependence ([place]) 01/13/2016  . 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 HTN (hypertension) 01/05/2016  . Gout 01/05/2016  . GERD (gastroesophageal reflux disease) 01/05/2016  . Diabetes ([place]) 01/05/2016  . Tobacco use disorder 01/05/2016  . Obesity 10/27/2015    Past Surgical History:  Procedure Laterality Date  . none      Allergies Bee venom and Bactrim [sulfamethoxazole-trimethoprim]  Social History Social History   Tobacco Use  . Smoking status: Current Every Day Smoker    Packs/day: 1.00    Types: Cigarettes  . Smokeless tobacco: Never Used  Substance Use Topics  . Alcohol use: Yes    Alcohol/week: 0.0 standard drinks    Comment: 14 beers today  . Drug use: Yes    Frequency: 1.0 times per week    Types: Marijuana,  Cocaine    Comment: last used cocaine Monday; last smoked marijuana monday   Review of Systems Constitutional: Negative for fever. Cardiovascular: Negative for chest pain. Respiratory: Negative for shortness of breath. Gastrointestinal: Negative for abdominal pain, vomiting and diarrhea. Musculoskeletal: Negative for back pain. Skin: Negative for rash. Neurological: Negative for headaches, focal weakness or numbness. Psychiatric: Positive for alcohol abuse, suicidal ideation  All systems negative/normal/unremarkable except as stated in the HPI  ____________________________________________   PHYSICAL EXAM:  VITAL SIGNS: ED Triage Vitals  Enc Vitals Group     BP 07/30/18 1758 139/77     Pulse Rate 07/30/18 1758 (!) 112     Resp 07/30/18 1758 20     Temp 07/30/18 1758 98.2 F (36.8 C)     Temp Source 07/30/18 1758 Oral     SpO2 07/30/18 1758 95 %     Weight 07/30/18 1800 250 lb (113.4 kg)     Height 07/30/18 1800 6\' 2"  (1.88 m)     Head Circumference --      Peak Flow --      Pain Score 07/30/18 1759 10     Pain Loc --      Pain Edu? --      Excl. in [place]? --    Constitutional: Alert and oriented. Well appearing and in no distress. Eyes: Conjunctivae are normal. Normal extraocular movements. ENT      Head: Normocephalic and atraumatic.      Nose: No congestion/rhinnorhea.  Mouth/Throat: Mucous membranes are moist.      Neck: No stridor. Cardiovascular: Normal rate, regular rhythm. No murmurs, rubs, or gallops. Respiratory: Normal respiratory effort without tachypnea nor retractions. Breath sounds are clear and equal bilaterally. No wheezes/rales/rhonchi. Gastrointestinal: Soft and nontender. Normal bowel sounds Musculoskeletal: Nontender with normal range of motion in extremities. No lower extremity tenderness nor edema. Neurologic:  Normal speech and language. No gross focal neurologic deficits are appreciated.  Skin:  Skin is warm, dry and intact. No rash  noted. Psychiatric: Depressed mood and affect ____________________________________________  ED COURSE:  As part of my medical decision making, I reviewed the following data within the [place] History obtained from family if available, nursing notes, old chart and ekg, as well as notes from prior ED visits. Patient presented for suicidal ideation, we will assess with labs and imaging as indicated at this time.   Procedures  [name] was evaluated in Emergency Department on 07/30/2018 for the symptoms described in the history of present illness. He was evaluated in the context of the global COVID-19 pandemic, which necessitated consideration that the patient might be at risk for infection with the SARS-CoV-2 virus that causes COVID-19. Institutional protocols and algorithms that pertain to the evaluation of patients at risk for COVID-19 are in a state of rapid change based on information released by regulatory bodies including the CDC and federal and state organizations. These policies and algorithms were followed during the patient's care in the ED.  ____________________________________________   LABS (pertinent positives/negatives)  Labs Reviewed  COMPREHENSIVE METABOLIC PANEL - Abnormal; Notable for the following components:      Result Value   Calcium 8.8 (*)    All other components within normal limits  ETHANOL - Abnormal; Notable for the following components:   Alcohol, Ethyl (B) 163 (*)    All other components within normal limits  ACETAMINOPHEN LEVEL - Abnormal; Notable for the following components:   Acetaminophen (Tylenol), Serum <10 (*)    All other components within normal limits  CBC - Abnormal; Notable for the following components:   HCT 38.9 (*)    All other components within normal limits  SALICYLATE LEVEL  URINE DRUG SCREEN, QUALITATIVE ([facility] ONLY)  ____________________________________________   DIFFERENTIAL DIAGNOSIS   Depression, suicidal  ideation, alcohol use disorder  FINAL ASSESSMENT AND PLAN  Alcohol intoxication, suicidal ideation   Plan: The patient had presented for suicidal ideation. Patient's labs did reveal mild alcohol intoxication.  Otherwise he appears medically clear for psychiatric evaluation and disposition.   Laurence Aly, MD    Note: This note was generated in part or whole with voice recognition software. Voice recognition is usually quite accurate but there are transcription errors that can and very often do occur. I apologize for any typographical errors that were not detected and corrected.     Earleen Newport, MD 07/30/18 [phone]

## 2018-08-25 ENCOUNTER — Other Ambulatory Visit: Payer: Self-pay | Admitting: Student

## 2018-08-25 DIAGNOSIS — M7711 Lateral epicondylitis, right elbow: Secondary | ICD-10-CM

## 2018-08-25 DIAGNOSIS — M7021 Olecranon bursitis, right elbow: Secondary | ICD-10-CM

## 2018-09-09 ENCOUNTER — Ambulatory Visit
Admission: RE | Admit: 2018-09-09 | Discharge: 2018-09-09 | Disposition: A | Discharge status: Home / Self Care | Payer: Medicare Other | Source: Ambulatory Visit | Attending: Student | Admitting: Student

## 2018-09-09 ENCOUNTER — Other Ambulatory Visit: Payer: Self-pay

## 2018-09-09 DIAGNOSIS — M7021 Olecranon bursitis, right elbow: Secondary | ICD-10-CM | POA: Diagnosis not present

## 2018-09-09 DIAGNOSIS — M7711 Lateral epicondylitis, right elbow: Secondary | ICD-10-CM | POA: Insufficient documentation

## 2018-09-21 ENCOUNTER — Ambulatory Visit: Payer: Medicare Other | Attending: Internal Medicine

## 2018-09-21 DIAGNOSIS — G4733 Obstructive sleep apnea (adult) (pediatric): Secondary | ICD-10-CM | POA: Diagnosis not present

## 2018-09-21 DIAGNOSIS — G4761 Periodic limb movement disorder: Secondary | ICD-10-CM | POA: Diagnosis not present

## 2018-09-22 ENCOUNTER — Encounter
Admission: RE | Admit: 2018-09-22 | Discharge: 2018-09-22 | Disposition: A | Discharge status: Home / Self Care | Payer: Medicare Other | Source: Ambulatory Visit | Attending: Surgery | Admitting: Surgery

## 2018-09-22 ENCOUNTER — Other Ambulatory Visit: Payer: Self-pay

## 2018-09-22 DIAGNOSIS — Z01810 Encounter for preprocedural cardiovascular examination: Secondary | ICD-10-CM | POA: Diagnosis not present

## 2018-09-22 DIAGNOSIS — I10 Essential (primary) hypertension: Secondary | ICD-10-CM | POA: Insufficient documentation

## 2018-09-22 DIAGNOSIS — Z01818 Encounter for other preprocedural examination: Secondary | ICD-10-CM | POA: Insufficient documentation

## 2018-09-22 DIAGNOSIS — Z1159 Encounter for screening for other viral diseases: Secondary | ICD-10-CM | POA: Diagnosis not present

## 2018-09-22 DIAGNOSIS — R9431 Abnormal electrocardiogram [ECG] [EKG]: Secondary | ICD-10-CM | POA: Insufficient documentation

## 2018-09-22 HISTORY — DX: Family history of other specified conditions: Z84.89

## 2018-09-22 LAB — SARS CORONAVIRUS 2 (TAT 6-24 HRS): SARS Coronavirus 2: NEGATIVE

## 2018-09-22 NOTE — Patient Instructions (Signed)
Your procedure is scheduled on: Tuesday 09/26/18 Report to [facility]. To find out your arrival time please call [phone] between 1PM - 3PM on Monday 09/25/18.  Remember: Instructions that are not followed completely may result in serious medical risk, up to and including death, or upon the discretion of your surgeon and anesthesiologist your surgery may need to be rescheduled.     _X__ 1. Do not eat food after midnight the night before your procedure.                 No gum chewing or hard candies. You may drink clear liquids up to 2 hours                 before you are scheduled to arrive for your surgery- DO not drink clear                 liquids within 2 hours of the start of your surgery.                 Clear Liquids include:  water, apple juice without pulp, clear carbohydrate                 drink such as Clearfast or Gatorade, Black Coffee or Tea (Do not add                 anything to coffee or tea). Diabetics water only  __X__2.  On the morning of surgery brush your teeth with toothpaste and water, you                 may rinse your mouth with mouthwash if you wish.  Do not swallow any              toothpaste of mouthwash.     _X__ 3.  No Alcohol for 24 hours before or after surgery.   _X__ 4.  Do Not Smoke or use e-cigarettes For 24 Hours Prior to Your Surgery.                 Do not use any chewable tobacco products for at least 6 hours prior to                 surgery.  ____  5.  Bring all medications with you on the day of surgery if instructed.   __X__  6.  Notify your doctor if there is any change in your medical condition      (cold, fever, infections).     Do not wear jewelry, make-up, hairpins, clips or nail polish. Do not wear lotions, powders, or perfumes.  Do not shave 48 hours prior to surgery. Men may shave face and neck. Do not bring valuables to the hospital.    [facility] is not responsible for any  belongings or valuables.  Contacts, dentures/partials or body piercings may not be worn into surgery. Bring a case for your contacts, glasses or hearing aids, a denture cup will be supplied. Leave your suitcase in the car. After surgery it may be brought to your room. For patients admitted to the hospital, discharge time is determined by your treatment team.   Patients discharged the day of surgery will not be allowed to drive home.   Please read over the following fact sheets that you were given:   MRSA Information  __X__ Take these medicines the morning of surgery with A SIP OF WATER:  1. hydrOXYzine (ATARAX/VISTARIL  2. metoprolol succinate (TOPROL-XL  3. NIFEdipine (PROCARDIA XL/NIFEDICAL XL  4. pantoprazole (PROTONIX)   5.  6.  ____ Fleet Enema (as directed)   __X__ Use CHG Soap/SAGE wipes as directed  __X__ Use inhalers on the day of surgery  ____ Stop metformin/Janumet/Farxiga 2 days prior to surgery    ____ Take 1/2 of usual insulin dose the night before surgery. No insulin the morning          of surgery.   ____ Stop Blood Thinners Coumadin/Plavix/Xarelto/Pleta/Pradaxa/Eliquis/Effient/Aspirin  on   Or contact your Surgeon, Cardiologist or Medical Doctor regarding  ability to stop your blood thinners  __X__ Stop Anti-inflammatories 7 days before surgery such as Advil, Ibuprofen, Motrin,  BC or Goodies Powder, Naprosyn, Naproxen, Aleve, Aspirin    __X__ Stop all herbal supplements, fish oil or vitamin E until after surgery.    ____ Bring C-Pap to the hospital.

## 2018-09-22 NOTE — Pre-Procedure Instructions (Signed)
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

## 2018-09-25 ENCOUNTER — Other Ambulatory Visit: Payer: Self-pay

## 2018-09-25 MED ORDER — CEFAZOLIN SODIUM-DEXTROSE 2-4 GM/100ML-% IV SOLN
2.0000 g | Freq: Once | INTRAVENOUS | Status: AC
  Administered 2018-09-26: 3 g via INTRAVENOUS

## 2018-09-26 ENCOUNTER — Ambulatory Visit: Payer: Medicare Other | Admitting: Anesthesiology

## 2018-09-26 ENCOUNTER — Ambulatory Visit
Admission: RE | Admit: 2018-09-26 | Discharge: 2018-09-26 | Disposition: A | Discharge status: Home / Self Care | Payer: Medicare Other | Source: Ambulatory Visit | Attending: Surgery | Admitting: Surgery

## 2018-09-26 ENCOUNTER — Encounter
Admission: RE | Disposition: A | Discharge status: Home / Self Care | Payer: Self-pay | Source: Ambulatory Visit | Attending: Surgery

## 2018-09-26 ENCOUNTER — Other Ambulatory Visit: Payer: Self-pay

## 2018-09-26 ENCOUNTER — Encounter: Payer: Self-pay | Admitting: *Deleted

## 2018-09-26 DIAGNOSIS — X58XXXA Exposure to other specified factors, initial encounter: Secondary | ICD-10-CM | POA: Insufficient documentation

## 2018-09-26 DIAGNOSIS — K219 Gastro-esophageal reflux disease without esophagitis: Secondary | ICD-10-CM | POA: Insufficient documentation

## 2018-09-26 DIAGNOSIS — I10 Essential (primary) hypertension: Secondary | ICD-10-CM | POA: Insufficient documentation

## 2018-09-26 DIAGNOSIS — M65831 Other synovitis and tenosynovitis, right forearm: Secondary | ICD-10-CM | POA: Insufficient documentation

## 2018-09-26 DIAGNOSIS — M25521 Pain in right elbow: Secondary | ICD-10-CM | POA: Diagnosis present

## 2018-09-26 DIAGNOSIS — S46311A Strain of muscle, fascia and tendon of triceps, right arm, initial encounter: Secondary | ICD-10-CM | POA: Insufficient documentation

## 2018-09-26 DIAGNOSIS — E119 Type 2 diabetes mellitus without complications: Secondary | ICD-10-CM | POA: Diagnosis not present

## 2018-09-26 DIAGNOSIS — Z6838 Body mass index (BMI) 38.0-38.9, adult: Secondary | ICD-10-CM | POA: Diagnosis not present

## 2018-09-26 DIAGNOSIS — Z79899 Other long term (current) drug therapy: Secondary | ICD-10-CM | POA: Insufficient documentation

## 2018-09-26 DIAGNOSIS — F17290 Nicotine dependence, other tobacco product, uncomplicated: Secondary | ICD-10-CM | POA: Diagnosis not present

## 2018-09-26 HISTORY — PX: TRICEPS TENDON REPAIR: SHX2577

## 2018-09-26 LAB — URINE DRUG SCREEN, QUALITATIVE (ARMC ONLY)
Amphetamines, Ur Screen: NOT DETECTED
Barbiturates, Ur Screen: NOT DETECTED
Benzodiazepine, Ur Scrn: NOT DETECTED
Cannabinoid 50 Ng, Ur ~~LOC~~: NOT DETECTED
Cocaine Metabolite,Ur ~~LOC~~: NOT DETECTED
MDMA (Ecstasy)Ur Screen: NOT DETECTED
Methadone Scn, Ur: NOT DETECTED
Opiate, Ur Screen: NOT DETECTED
Phencyclidine (PCP) Ur S: NOT DETECTED
Tricyclic, Ur Screen: NOT DETECTED

## 2018-09-26 LAB — GLUCOSE, CAPILLARY: Glucose-Capillary: 97 mg/dL (ref 70–99)

## 2018-09-26 SURGERY — REPAIR, TENDON, TRICEPS
Anesthesia: General | Laterality: Right

## 2018-09-26 MED ORDER — LIDOCAINE HCL 1 % IJ SOLN
INTRAMUSCULAR | Status: AC
  Filled 2018-09-26: qty 2

## 2018-09-26 MED ORDER — LIDOCAINE HCL (CARDIAC) PF 100 MG/5ML IV SOSY
PREFILLED_SYRINGE | INTRAVENOUS | Status: DC | PRN
  Administered 2018-09-26: 100 mg via INTRAVENOUS

## 2018-09-26 MED ORDER — FENTANYL CITRATE (PF) 100 MCG/2ML IJ SOLN
INTRAMUSCULAR | Status: DC | PRN
  Administered 2018-09-26: 100 ug via INTRAVENOUS

## 2018-09-26 MED ORDER — SODIUM CHLORIDE (PF) 0.9 % IJ SOLN
INTRAMUSCULAR | Status: AC
  Filled 2018-09-26: qty 10

## 2018-09-26 MED ORDER — PHENYLEPHRINE HCL (PRESSORS) 10 MG/ML IV SOLN
INTRAVENOUS | Status: DC | PRN
  Administered 2018-09-26 (×2): 200 ug via INTRAVENOUS
  Administered 2018-09-26: 100 ug via INTRAVENOUS
  Administered 2018-09-26: 200 ug via INTRAVENOUS

## 2018-09-26 MED ORDER — HYDROCODONE-ACETAMINOPHEN 7.5-325 MG PO TABS: 1.0000 | ORAL_TABLET | Freq: Once | ORAL | Status: DC | PRN

## 2018-09-26 MED ORDER — PROPOFOL 10 MG/ML IV BOLUS
INTRAVENOUS | Status: DC | PRN
  Administered 2018-09-26: 200 mg via INTRAVENOUS

## 2018-09-26 MED ORDER — CEFAZOLIN SODIUM-DEXTROSE 2-4 GM/100ML-% IV SOLN
INTRAVENOUS | Status: AC
  Filled 2018-09-26: qty 100

## 2018-09-26 MED ORDER — FENTANYL CITRATE (PF) 100 MCG/2ML IJ SOLN
INTRAMUSCULAR | Status: AC
  Filled 2018-09-26: qty 2

## 2018-09-26 MED ORDER — DEXAMETHASONE SODIUM PHOSPHATE 4 MG/ML IJ SOLN
INTRAMUSCULAR | Status: AC
  Filled 2018-09-26: qty 1

## 2018-09-26 MED ORDER — EPHEDRINE SULFATE 50 MG/ML IJ SOLN
INTRAMUSCULAR | Status: DC | PRN
  Administered 2018-09-26: 10 mg via INTRAVENOUS

## 2018-09-26 MED ORDER — HYDROMORPHONE HCL 1 MG/ML IJ SOLN
0.2500 mg | INTRAMUSCULAR | Status: DC | PRN
  Administered 2018-09-26: 0.25 mg via INTRAVENOUS

## 2018-09-26 MED ORDER — ACETAMINOPHEN 160 MG/5ML PO SOLN
325.0000 mg | ORAL | Status: DC | PRN
  Filled 2018-09-26: qty 20.3

## 2018-09-26 MED ORDER — DEXAMETHASONE SODIUM PHOSPHATE 10 MG/ML IJ SOLN
INTRAMUSCULAR | Status: AC
  Filled 2018-09-26: qty 1

## 2018-09-26 MED ORDER — MIDAZOLAM HCL 2 MG/2ML IJ SOLN
1.0000 mg | Freq: Once | INTRAMUSCULAR | Status: DC
  Administered 2018-09-26: 1 mg via INTRAVENOUS

## 2018-09-26 MED ORDER — FENTANYL CITRATE (PF) 100 MCG/2ML IJ SOLN
INTRAMUSCULAR | Status: AC
  Administered 2018-09-26: 50 ug via INTRAVENOUS
  Filled 2018-09-26: qty 2

## 2018-09-26 MED ORDER — BUPIVACAINE HCL (PF) 0.5 % IJ SOLN
INTRAMUSCULAR | Status: DC | PRN
  Administered 2018-09-26: 10 mL

## 2018-09-26 MED ORDER — SUCCINYLCHOLINE CHLORIDE 20 MG/ML IJ SOLN
INTRAMUSCULAR | Status: DC | PRN
  Administered 2018-09-26: 120 mg via INTRAVENOUS

## 2018-09-26 MED ORDER — BUPIVACAINE-EPINEPHRINE (PF) 0.5% -1:200000 IJ SOLN
INTRAMUSCULAR | Status: AC
  Filled 2018-09-26: qty 30

## 2018-09-26 MED ORDER — MIDAZOLAM HCL 2 MG/2ML IJ SOLN
INTRAMUSCULAR | Status: AC
  Administered 2018-09-26: 1 mg via INTRAVENOUS
  Filled 2018-09-26: qty 2

## 2018-09-26 MED ORDER — PHENYLEPHRINE HCL (PRESSORS) 10 MG/ML IV SOLN
INTRAVENOUS | Status: AC
  Filled 2018-09-26: qty 1

## 2018-09-26 MED ORDER — SODIUM CHLORIDE 0.9 % IV SOLN
INTRAVENOUS | Status: DC | PRN
  Administered 2018-09-26: 50 ug/min via INTRAVENOUS

## 2018-09-26 MED ORDER — HYDROMORPHONE HCL 1 MG/ML IJ SOLN
INTRAMUSCULAR | Status: AC
  Filled 2018-09-26: qty 1

## 2018-09-26 MED ORDER — VASOPRESSIN 20 UNIT/ML IV SOLN
INTRAVENOUS | Status: DC | PRN
  Administered 2018-09-26: 1 [IU] via INTRAVENOUS
  Administered 2018-09-26 (×2): 2 [IU] via INTRAVENOUS

## 2018-09-26 MED ORDER — ONDANSETRON HCL 4 MG/2ML IJ SOLN
INTRAMUSCULAR | Status: AC
  Filled 2018-09-26: qty 2

## 2018-09-26 MED ORDER — SODIUM CHLORIDE 0.9 % IV SOLN
INTRAVENOUS | Status: DC
  Administered 2018-09-26: 15:00:00 via INTRAVENOUS

## 2018-09-26 MED ORDER — LIDOCAINE HCL (PF) 2 % IJ SOLN
INTRAMUSCULAR | Status: AC
  Filled 2018-09-26: qty 10

## 2018-09-26 MED ORDER — MIDAZOLAM HCL 2 MG/2ML IJ SOLN
1.0000 mg | Freq: Once | INTRAMUSCULAR | Status: AC
  Administered 2018-09-26: 1 mg via INTRAVENOUS

## 2018-09-26 MED ORDER — NEOMYCIN-POLYMYXIN B GU 40-200000 IR SOLN
Status: DC | PRN
  Administered 2018-09-26: 2 mL

## 2018-09-26 MED ORDER — NEOMYCIN-POLYMYXIN B GU 40-200000 IR SOLN
Status: AC
  Filled 2018-09-26: qty 2

## 2018-09-26 MED ORDER — FENTANYL CITRATE (PF) 100 MCG/2ML IJ SOLN
50.0000 ug | Freq: Once | INTRAMUSCULAR | Status: DC
  Administered 2018-09-26: 50 ug via INTRAVENOUS

## 2018-09-26 MED ORDER — ROCURONIUM BROMIDE 50 MG/5ML IV SOLN
INTRAVENOUS | Status: AC
  Filled 2018-09-26: qty 1

## 2018-09-26 MED ORDER — ONDANSETRON HCL 4 MG/2ML IJ SOLN
INTRAMUSCULAR | Status: DC | PRN
  Administered 2018-09-26: 4 mg via INTRAVENOUS

## 2018-09-26 MED ORDER — IPRATROPIUM-ALBUTEROL 0.5-2.5 (3) MG/3ML IN SOLN
3.0000 mL | Freq: Once | RESPIRATORY_TRACT | Status: AC
  Administered 2018-09-26: 3 mL via RESPIRATORY_TRACT

## 2018-09-26 MED ORDER — ACETAMINOPHEN 325 MG PO TABS: 325.0000 mg | ORAL_TABLET | ORAL | Status: DC | PRN

## 2018-09-26 MED ORDER — IPRATROPIUM-ALBUTEROL 0.5-2.5 (3) MG/3ML IN SOLN
RESPIRATORY_TRACT | Status: AC
  Administered 2018-09-26: 3 mL via RESPIRATORY_TRACT
  Filled 2018-09-26: qty 3

## 2018-09-26 MED ORDER — PROMETHAZINE HCL 25 MG/ML IJ SOLN: 6.2500 mg | INTRAMUSCULAR | Status: DC | PRN

## 2018-09-26 MED ORDER — BUPIVACAINE HCL (PF) 0.5 % IJ SOLN
INTRAMUSCULAR | Status: AC
  Filled 2018-09-26: qty 30

## 2018-09-26 MED ORDER — HYDROCODONE-ACETAMINOPHEN 5-325 MG PO TABS: 1.0000 | ORAL_TABLET | Freq: Four times a day (QID) | ORAL | 0 refills | Status: DC | PRN

## 2018-09-26 MED ORDER — MEPERIDINE HCL 50 MG/ML IJ SOLN: 6.2500 mg | INTRAMUSCULAR | Status: DC | PRN

## 2018-09-26 MED ORDER — PROPOFOL 10 MG/ML IV BOLUS
INTRAVENOUS | Status: AC
  Filled 2018-09-26: qty 40

## 2018-09-26 MED ORDER — FENTANYL CITRATE (PF) 100 MCG/2ML IJ SOLN
50.0000 ug | Freq: Once | INTRAMUSCULAR | Status: AC
  Administered 2018-09-26: 50 ug via INTRAVENOUS

## 2018-09-26 MED ORDER — SUGAMMADEX SODIUM 200 MG/2ML IV SOLN
INTRAVENOUS | Status: AC
  Filled 2018-09-26: qty 2

## 2018-09-26 MED ORDER — EPINEPHRINE PF 1 MG/ML IJ SOLN
INTRAMUSCULAR | Status: AC
  Filled 2018-09-26: qty 1

## 2018-09-26 MED ORDER — BUPIVACAINE HCL (PF) 0.5 % IJ SOLN
INTRAMUSCULAR | Status: AC
  Filled 2018-09-26: qty 10

## 2018-09-26 SURGICAL SUPPLY — 50 items
ANCHOR JUGGERKNOT WTAP NDL 2.9 (Anchor) ×4 IMPLANT
ANCHOR SUT QUATTRO KNTLS 4.5 (Anchor) ×2 IMPLANT
BIT DRILL JUGRKNT W/NDL BIT2.9 (DRILL) IMPLANT
BLADE SURG SZ10 CARB STEEL (BLADE) ×6 IMPLANT
BNDG COHESIVE 4X5 TAN STRL (GAUZE/BANDAGES/DRESSINGS) ×3 IMPLANT
BNDG ESMARK 4X12 TAN STRL LF (GAUZE/BANDAGES/DRESSINGS) ×3 IMPLANT
BUR 4.8X51.2 (BURR) ×3 IMPLANT
CANISTER SUCT 1200ML W/VALVE (MISCELLANEOUS) ×3 IMPLANT
CHLORAPREP W/TINT 26 (MISCELLANEOUS) ×3 IMPLANT
COVER WAND RF STERILE (DRAPES) ×3 IMPLANT
CUFF DUAL TOURNIQUET 18IN DISP (TOURNIQUET CUFF) ×2 IMPLANT
CUFF TOURN 24 STER (MISCELLANEOUS) IMPLANT
DRILL JUGGERKNOT W/NDL BIT 2.9 (DRILL) ×3
ELECT REM PT RETURN 9FT ADLT (ELECTROSURGICAL) ×3
ELECTRODE REM PT RTRN 9FT ADLT (ELECTROSURGICAL) ×1 IMPLANT
GAUZE SPONGE 4X4 12PLY STRL (GAUZE/BANDAGES/DRESSINGS) ×3 IMPLANT
GAUZE XEROFORM 1X8 LF (GAUZE/BANDAGES/DRESSINGS) ×3 IMPLANT
GLOVE BIO SURGEON STRL SZ8 (GLOVE) ×6 IMPLANT
GLOVE INDICATOR 8.0 STRL GRN (GLOVE) ×3 IMPLANT
GOWN STRL REUS W/ TWL LRG LVL3 (GOWN DISPOSABLE) ×1 IMPLANT
GOWN STRL REUS W/ TWL XL LVL3 (GOWN DISPOSABLE) ×1 IMPLANT
GOWN STRL REUS W/TWL LRG LVL3 (GOWN DISPOSABLE) ×2
GOWN STRL REUS W/TWL XL LVL3 (GOWN DISPOSABLE) ×2
KIT TURNOVER KIT A (KITS) ×3 IMPLANT
LOOP RED MAXI  1X406MM (MISCELLANEOUS) ×2
LOOP VESSEL MAXI 1X406 RED (MISCELLANEOUS) ×1 IMPLANT
NDL FILTER BLUNT 18X1 1/2 (NEEDLE) ×1 IMPLANT
NDL MAYO CATGUT SZ5 (NEEDLE)
NDL SUT 5 .5 CRC TPR PNT MAYO (NEEDLE) ×1 IMPLANT
NEEDLE FILTER BLUNT 18X 1/2SAF (NEEDLE) ×2
NEEDLE FILTER BLUNT 18X1 1/2 (NEEDLE) ×1 IMPLANT
NS IRRIG 500ML POUR BTL (IV SOLUTION) ×3 IMPLANT
PACK EXTREMITY ARMC (MISCELLANEOUS) ×3 IMPLANT
PAD ABD DERMACEA PRESS 5X9 (GAUZE/BANDAGES/DRESSINGS) ×3 IMPLANT
PADDING CAST BLEND 4X4 NS (MISCELLANEOUS) ×3 IMPLANT
PASSER SUT SWANSON 36MM LOOP (INSTRUMENTS) IMPLANT
RETRIEVER SUT LRG (INSTRUMENTS) IMPLANT
SLING ARM LRG DEEP (SOFTGOODS) ×3 IMPLANT
SPLINT CAST 1 STEP 4X30 (MISCELLANEOUS) ×3 IMPLANT
SPONGE LAP 18X18 RF (DISPOSABLE) ×1 IMPLANT
STAPLER SKIN PROX 35W (STAPLE) ×3 IMPLANT
STOCKINETTE IMPERVIOUS 9X36 MD (GAUZE/BANDAGES/DRESSINGS) ×3 IMPLANT
SUT FIBERWIRE #2 38 T-5 BLUE (SUTURE) ×3
SUT FIBERWIRE #5 38 BLUE (WIRE) ×3 IMPLANT
SUT RETRIEVER MED (INSTRUMENTS) IMPLANT
SUT RETRIEVER SML (INSTRUMENTS) IMPLANT
SUT VIC AB 2-0 CT1 27 (SUTURE) ×4
SUT VIC AB 2-0 CT1 TAPERPNT 27 (SUTURE) ×2 IMPLANT
SUTURE FIBERWR #2 38 T-5 BLUE (SUTURE) ×1 IMPLANT
SYR 5ML LL (SYRINGE) ×3 IMPLANT

## 2018-09-26 NOTE — Op Note (Signed)
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  Post-Op Diagnosis:   Same  Procedure:   Debridement and primary repair of partial-thickness right distal triceps tendon tear.  Surgeon:   Pascal Lux, MD  Assistant:   None  Anesthesia:   GET with a supraclavicular block using Exparel placed preoperative by the anesthesiologist.  Findings:   As above.  Complications:   None  EBL:   2 cc  Fluids:   800 cc crystalloid  TT:   45 minutes at 250 mmHg  Drains:   None  Closure:   Staples  Brief Clinical Note:   The patient is a 43 year old male with a several year history of posterior right elbow pain. His symptoms have worsened over the past 4 months or so despite medications, activity modification, an olecranon bursa injection, etc. His history and examination were suspicious for possible tearing of the triceps tendon which was confirmed by MRI scan. The patient presents at this time for definitive management of this injury.  Procedure:   The patient underwent placement of a supraclavicular block using Exparel by the anesthesiologist in the preoperative holding area before he was brought into the operating room and lain in the supine position. After adequate general endotracheal intubation and anesthesia was obtained, the patient's right upper extremity was prepped with ChloraPrep solution before being draped sterilely. Preoperative antibiotics were administered. A timeout was performed  to verify the appropriate surgical site before the limb was exsanguinated with an Esmarch and a sterile tourniquet inflated to 250 mmHg.   An approximately 8-10 cm curvilinear incision was made along the posterior aspect of the elbow, curving radially around the tip of the olecranon so as to avoid having a scar directly over the posterior elbow. The incision was carried down through the  subcutaneous tissues to expose the triceps tendon. A focal partial-thickness tear of the distal triceps tendon at the olecranon tip measuring approximately 6 x 8 mm was identified. The frayed margins of the tendon tear were debrided sharply with a #15 blade before the bone itself was lightly rongeured. Two 2.9 mm Biomet JuggerKnot anchors were placed into the posterior olecranon. Each of the 4 sets of sutures were passed through the tendon and tied securely. The most proximal suture was woven in a Bennell type fashion through the distal triceps tendon to provide additional distal traction on the repair. All of the sutures were then brought distally and secured to the posterior proximal ulna using a single 4.5 mm [place] QuatroLink anchor. An apparent anatomic repair was achieved.   The wound was copiously irrigated with sterile saline solution before the subcutaneous tissues were closed in two layers using 2-0 Vicryl interrupted sutures. The skin was closed using staples. A total of 10 cc of 0.5% plain Sensorcaine was injected in and around the incision to help with postoperative analgesia. A sterile bulky dressing was applied to the arm before the arm was placed into a posterior splint maintaining the elbow at approximately 90 of flexion. The patient's arm then was placed into a sling before he was awakened, extubated, and returned to the recovery room in satisfactory condition after tolerating the procedure well.

## 2018-09-26 NOTE — Anesthesia Post-op Follow-up Note (Signed)
Anesthesia QCDR form completed.        

## 2018-09-26 NOTE — Anesthesia Postprocedure Evaluation (Signed)
Anesthesia Post Note  Patient: Alan Eaton  Procedure(s) Performed: TRICEPS TENDON DEBRIDEMENT WITH REPAIR OF PARTIAL THICKENESSTENDON TEAR OF ELBOW (Right )  Patient location during evaluation: PACU Anesthesia Type: General Level of consciousness: awake and alert Pain management: pain level controlled Vital Signs Assessment: post-procedure vital signs reviewed and stable Respiratory status: spontaneous breathing, nonlabored ventilation, respiratory function stable and patient connected to nasal cannula oxygen Cardiovascular status: blood pressure returned to baseline and stable Postop Assessment: no apparent nausea or vomiting Anesthetic complications: no     Last Vitals:  Vitals:   09/26/18 1737 09/26/18 1744  BP: (!) 141/94 (!) 144/81  Pulse: 87 90  Resp: 18 16  Temp: 36.6 C (!) 36.3 C  SpO2: 93% 95%    Last Pain:  Vitals:   09/26/18 1744  TempSrc: Tympanic  PainSc: [place] Malissia Rabbani

## 2018-09-26 NOTE — Discharge Instructions (Addendum)
Orthopedic discharge instructions: Keep splint dry and intact. Keep hand elevated above heart level. Apply ice to affected area frequently. Take ibuprofen 600-800 mg TID with meals for 7-10 days, then as necessary. Take pain medication as prescribed or ES Tylenol when needed.  Return for follow-up in 10-14 days or as scheduled.  AMBULATORY SURGERY  DISCHARGE INSTRUCTIONS   1) The drugs that you were given will stay in your system until tomorrow so for the next 24 hours you should not:  A) Drive an automobile B) Make any legal decisions C) Drink any alcoholic beverage   2) You may resume regular meals tomorrow.  Today it is better to start with liquids and gradually work up to solid foods.  You may eat anything you prefer, but it is better to start with liquids, then soup and crackers, and gradually work up to solid foods.   3) Please notify your doctor immediately if you have any unusual bleeding, trouble breathing, redness and pain at the surgery site, drainage, fever, or pain not relieved by medication.    4) Additional Instructions:        Please contact your physician with any problems or Same Day Surgery at [phone], Monday through Friday 6 am to 4 pm, or [place] [facility] [place] Main number at [phone]. 

## 2018-09-26 NOTE — Anesthesia Procedure Notes (Addendum)
Anesthesia Regional Block: Supraclavicular block   Pre-Anesthetic Checklist: ,, timeout performed, Correct Patient, Correct Site, Correct Laterality, Correct Procedure, Correct Position, site marked, Risks and benefits discussed,  Surgical consent,  Pre-op evaluation,  At surgeon's request and post-op pain management  Laterality: Right  Prep: Maximum Sterile Barrier Precautions used, chloraprep       Needles:   Needle Type: Echogenic Stimulator Needle     Needle Length: 10cm  Needle Gauge: 21   Needle insertion depth: 6 cm   Additional Needles:   Procedures: Doppler guided,,,, ultrasound used (permanent image in chart),,,,  Motor weakness within 5 minutes.  Narrative:  Start time: 09/26/2018 2:53 PM End time: 09/26/2018 2:59 PM  Performed by: Personally  Anesthesiologist: Alphonsus Sias, MD  Additional Notes: R supraclavicular blk w 1% lido skin wheal, easy location plexus w 30 ml 0.3% bupiv w 8 mg decadron/1:200 epi. 5 ml increments w neg aspiration. O2/IV sed and monitors in holding area. Tol procedure well, good block result within 5 minutes

## 2018-09-26 NOTE — Transfer of Care (Signed)
Immediate Anesthesia Transfer of Care Note  Patient: Alan Eaton  Procedure(s) Performed: TRICEPS TENDON DEBRIDEMENT WITH REPAIR OF PARTIAL THICKENESSTENDON TEAR OF ELBOW (Right )  Patient Location: PACU  Anesthesia Type:General  Level of Consciousness: awake, alert  and oriented  Airway & Oxygen Therapy: Patient Spontanous Breathing and Patient connected to face mask oxygen  Post-op Assessment: Report given to RN and Post -op Vital signs reviewed and stable  Post vital signs: Reviewed and stable  Last Vitals:  Vitals Value Taken Time  BP    Temp    Pulse    Resp    SpO2      Last Pain:  Vitals:   09/26/18 1340  TempSrc: Oral  PainSc: 10-Worst pain ever      Patients Stated Pain Goal: 3 (96/22/29 7989)  Complications: No apparent anesthesia complications

## 2018-09-26 NOTE — Anesthesia Procedure Notes (Signed)
Procedure Name: Intubation Date/Time: 09/26/2018 3:20 PM Performed by: Chanetta Marshall, CRNA Pre-anesthesia Checklist: Patient identified, Emergency Drugs available, Suction available and Patient being monitored Patient Re-evaluated:Patient Re-evaluated prior to induction Oxygen Delivery Method: Circle system utilized Preoxygenation: Pre-oxygenation with 100% oxygen Induction Type: IV induction Ventilation: Mask ventilation without difficulty Laryngoscope Size: McGraph and 4 Tube type: Oral Tube size: 7.5 mm Number of attempts: 1 Airway Equipment and Method: Stylet and Oral airway Placement Confirmation: ETT inserted through vocal cords under direct vision,  positive ETCO2 and breath sounds checked- equal and bilateral Secured at: 23 cm Tube secured with: Tape Dental Injury: Teeth and Oropharynx as per pre-operative assessment

## 2018-09-26 NOTE — H&P (Signed)
Paper H&P to be scanned into permanent record. H&P reviewed and patient re-examined. No changes. 

## 2018-09-26 NOTE — Anesthesia Preprocedure Evaluation (Addendum)
Anesthesia Evaluation  Patient identified by MRN, date of birth, ID band Patient awake    Reviewed: Allergy & Precautions, H&P , NPO status , reviewed documented beta blocker date and time   History of Anesthesia Complications (+) Family history of anesthesia reaction  Airway Mallampati: III  TM Distance: >3 FB Neck ROM: full    Dental  (+) Edentulous Upper, Edentulous Lower   Pulmonary Current Smoker,     + decreased breath sounds      Cardiovascular hypertension, Normal cardiovascular exam     Neuro/Psych PSYCHIATRIC DISORDERS Depression    GI/Hepatic GERD  Controlled,  Endo/Other  diabetesMorbid obesity  Renal/GU      Musculoskeletal   Abdominal   Peds  Hematology   Anesthesia Other Findings Past Medical History: No date: Alcohol abuse No date: Bronchitis No date: Depression No date: Diabetes mellitus without complication (HCC)     Comment:  lost weight diet mgmt at present No date: Drug abuse (HCC) No date: Family history of adverse reaction to anesthesia No date: GERD (gastroesophageal reflux disease) No date: Gout No date: Hypertension No date: Morbid obesity ([place]) No date: Suicide ideation Past Surgical History: No date: none No date: UPPER GASTROINTESTINAL ENDOSCOPY BMI    Body Mass Index: 38.34 kg/m     Reproductive/Obstetrics                             Anesthesia Physical Anesthesia Plan  ASA: III  Anesthesia Plan: General   Post-op Pain Management:  Regional for Post-op pain   Induction: Intravenous  PONV Risk Score and Plan: Ondansetron, Treatment may vary due to age or medical condition, Midazolam, Dexamethasone and Promethazine  Airway Management Planned: Oral ETT  Additional Equipment:   Intra-op Plan:   Post-operative Plan:   Informed Consent: I have reviewed the patients History and Physical, chart, labs and discussed the procedure including the  risks, benefits and alternatives for the proposed anesthesia with the patient or authorized representative who has indicated his/her understanding and acceptance.     Dental Advisory Given  Plan Discussed with: CRNA, Anesthesiologist and Surgeon  Anesthesia Plan Comments:         Anesthesia Quick Evaluation

## 2018-09-27 ENCOUNTER — Encounter: Payer: Self-pay | Admitting: Surgery

## 2018-10-31 ENCOUNTER — Other Ambulatory Visit: Payer: Self-pay

## 2018-10-31 ENCOUNTER — Encounter: Payer: Self-pay | Admitting: Emergency Medicine

## 2018-10-31 ENCOUNTER — Emergency Department: Payer: Medicare Other

## 2018-10-31 ENCOUNTER — Emergency Department
Admission: EM | Admit: 2018-10-31 | Discharge: 2018-10-31 | Disposition: A | Discharge status: Other Acute Inpatient Hospital | Payer: Medicare Other | Source: Home / Self Care | Attending: Emergency Medicine | Admitting: Emergency Medicine

## 2018-10-31 ENCOUNTER — Inpatient Hospital Stay
Admission: RE | Admit: 2018-10-31 | Discharge: 2018-11-02 | DRG: 882 | Disposition: A | Discharge status: Home / Self Care | Payer: Medicare Other | Source: Intra-hospital | Attending: Psychiatry | Admitting: Psychiatry

## 2018-10-31 DIAGNOSIS — F322 Major depressive disorder, single episode, severe without psychotic features: Secondary | ICD-10-CM | POA: Insufficient documentation

## 2018-10-31 DIAGNOSIS — Z20828 Contact with and (suspected) exposure to other viral communicable diseases: Secondary | ICD-10-CM | POA: Insufficient documentation

## 2018-10-31 DIAGNOSIS — F17210 Nicotine dependence, cigarettes, uncomplicated: Secondary | ICD-10-CM | POA: Insufficient documentation

## 2018-10-31 DIAGNOSIS — F339 Major depressive disorder, recurrent, unspecified: Secondary | ICD-10-CM | POA: Insufficient documentation

## 2018-10-31 DIAGNOSIS — I10 Essential (primary) hypertension: Secondary | ICD-10-CM | POA: Insufficient documentation

## 2018-10-31 DIAGNOSIS — J449 Chronic obstructive pulmonary disease, unspecified: Secondary | ICD-10-CM | POA: Diagnosis present

## 2018-10-31 DIAGNOSIS — F4325 Adjustment disorder with mixed disturbance of emotions and conduct: Principal | ICD-10-CM | POA: Diagnosis present

## 2018-10-31 DIAGNOSIS — F17220 Nicotine dependence, chewing tobacco, uncomplicated: Secondary | ICD-10-CM | POA: Insufficient documentation

## 2018-10-31 DIAGNOSIS — F1020 Alcohol dependence, uncomplicated: Secondary | ICD-10-CM | POA: Diagnosis present

## 2018-10-31 DIAGNOSIS — K219 Gastro-esophageal reflux disease without esophagitis: Secondary | ICD-10-CM | POA: Diagnosis present

## 2018-10-31 DIAGNOSIS — R45851 Suicidal ideations: Secondary | ICD-10-CM | POA: Diagnosis present

## 2018-10-31 DIAGNOSIS — E119 Type 2 diabetes mellitus without complications: Secondary | ICD-10-CM | POA: Insufficient documentation

## 2018-10-31 DIAGNOSIS — Z79899 Other long term (current) drug therapy: Secondary | ICD-10-CM | POA: Insufficient documentation

## 2018-10-31 LAB — CBC
HCT: 43.2 % (ref 39.0–52.0)
Hemoglobin: 14.6 g/dL (ref 13.0–17.0)
MCH: 29.3 pg (ref 26.0–34.0)
MCHC: 33.8 g/dL (ref 30.0–36.0)
MCV: 86.7 fL (ref 80.0–100.0)
Platelets: 262 10*3/uL (ref 150–400)
RBC: 4.98 MIL/uL (ref 4.22–5.81)
RDW: 13.5 % (ref 11.5–15.5)
WBC: 10.6 10*3/uL — ABNORMAL HIGH (ref 4.0–10.5)
nRBC: 0 % (ref 0.0–0.2)

## 2018-10-31 LAB — COMPREHENSIVE METABOLIC PANEL
ALT: 25 U/L (ref 0–44)
AST: 25 U/L (ref 15–41)
Albumin: 4.6 g/dL (ref 3.5–5.0)
Alkaline Phosphatase: 65 U/L (ref 38–126)
Anion gap: 11 (ref 5–15)
BUN: 11 mg/dL (ref 6–20)
CO2: 21 mmol/L — ABNORMAL LOW (ref 22–32)
Calcium: 9 mg/dL (ref 8.9–10.3)
Chloride: 106 mmol/L (ref 98–111)
Creatinine, Ser: 1.43 mg/dL — ABNORMAL HIGH (ref 0.61–1.24)
GFR calc Af Amer: >60 mL/min (ref >60)
GFR calc non Af Amer: 59 mL/min — ABNORMAL LOW (ref >60)
Glucose, Bld: 98 mg/dL (ref 70–99)
Potassium: 3.7 mmol/L (ref 3.5–5.1)
Sodium: 138 mmol/L (ref 135–145)
Total Bilirubin: 0.5 mg/dL (ref 0.3–1.2)
Total Protein: 8.2 g/dL — ABNORMAL HIGH (ref 6.5–8.1)

## 2018-10-31 LAB — URINE DRUG SCREEN, QUALITATIVE (ARMC ONLY)
Amphetamines, Ur Screen: NOT DETECTED
Barbiturates, Ur Screen: NOT DETECTED
Benzodiazepine, Ur Scrn: NOT DETECTED
Cannabinoid 50 Ng, Ur ~~LOC~~: NOT DETECTED
Cocaine Metabolite,Ur ~~LOC~~: NOT DETECTED
MDMA (Ecstasy)Ur Screen: NOT DETECTED
Methadone Scn, Ur: NOT DETECTED
Opiate, Ur Screen: NOT DETECTED
Phencyclidine (PCP) Ur S: NOT DETECTED
Tricyclic, Ur Screen: NOT DETECTED

## 2018-10-31 LAB — SALICYLATE LEVEL: Salicylate Lvl: <7 mg/dL (ref 2.8–30.0)

## 2018-10-31 LAB — ACETAMINOPHEN LEVEL: Acetaminophen (Tylenol), Serum: <10 ug/mL — ABNORMAL LOW (ref 10–30)

## 2018-10-31 LAB — SARS CORONAVIRUS 2 BY RT PCR (HOSPITAL ORDER, PERFORMED IN ~~LOC~~ HOSPITAL LAB): SARS Coronavirus 2: NEGATIVE

## 2018-10-31 LAB — ETHANOL: Alcohol, Ethyl (B): 244 mg/dL — ABNORMAL HIGH (ref <10)

## 2018-10-31 MED ORDER — ALUM & MAG HYDROXIDE-SIMETH 200-200-20 MG/5ML PO SUSP: 30.0000 mL | ORAL | Status: DC | PRN

## 2018-10-31 MED ORDER — TRAZODONE HCL 50 MG PO TABS
50.0000 mg | ORAL_TABLET | Freq: Once | ORAL | Status: AC
  Administered 2018-10-31: 50 mg via ORAL
  Filled 2018-10-31: qty 1

## 2018-10-31 MED ORDER — ACETAMINOPHEN 325 MG PO TABS
650.0000 mg | ORAL_TABLET | Freq: Four times a day (QID) | ORAL | Status: DC | PRN
  Administered 2018-11-01 – 2018-11-02 (×2): 650 mg via ORAL
  Filled 2018-10-31 (×2): qty 2

## 2018-10-31 MED ORDER — ACETAMINOPHEN 325 MG PO TABS
650.0000 mg | ORAL_TABLET | Freq: Once | ORAL | Status: AC
  Administered 2018-10-31: 650 mg via ORAL
  Filled 2018-10-31: qty 2

## 2018-10-31 MED ORDER — NIFEDIPINE ER 60 MG PO TB24
60.0000 mg | ORAL_TABLET | Freq: Every day | ORAL | Status: DC
  Filled 2018-10-31: qty 1

## 2018-10-31 MED ORDER — HYDROXYZINE HCL 25 MG PO TABS: 25.0000 mg | ORAL_TABLET | Freq: Three times a day (TID) | ORAL | Status: DC | PRN

## 2018-10-31 MED ORDER — TRAZODONE HCL 50 MG PO TABS
50.0000 mg | ORAL_TABLET | Freq: Every evening | ORAL | Status: DC | PRN
  Administered 2018-11-01: 50 mg via ORAL
  Filled 2018-10-31: qty 1

## 2018-10-31 MED ORDER — TRAZODONE HCL 50 MG PO TABS: 50.0000 mg | ORAL_TABLET | Freq: Every day | ORAL | Status: DC

## 2018-10-31 MED ORDER — MAGNESIUM HYDROXIDE 400 MG/5ML PO SUSP: 30.0000 mL | Freq: Every day | ORAL | Status: DC | PRN

## 2018-10-31 MED ORDER — METOPROLOL SUCCINATE ER 50 MG PO TB24
25.0000 mg | ORAL_TABLET | Freq: Every day | ORAL | Status: DC
  Administered 2018-10-31: 25 mg via ORAL
  Filled 2018-10-31: qty 1

## 2018-10-31 NOTE — Plan of Care (Signed)
Patient newly admitted, hasn't had time to progress.   Problem: Education: Goal: Ability to make informed decisions regarding treatment will improve Outcome: Not Progressing   Problem: Education: Goal: Knowledge of [facility] Education information/materials will improve Outcome: Not Progressing   Problem: Coping: Goal: Ability to verbalize frustrations and anger appropriately will improve Outcome: Not Progressing Goal: Ability to demonstrate self-control will improve Outcome: Not Progressing   Problem: Health Behavior/Discharge Planning: Goal: Identification of resources available to assist in meeting health care needs will improve Outcome: Not Progressing Goal: Compliance with treatment plan for underlying cause of condition will improve Outcome: Not Progressing

## 2018-10-31 NOTE — ED Notes (Signed)
Patient observed lying in bed with eyes closed  Even, unlabored respirations observed   NAD pt appears to be sleeping  I will continue to monitor along with every 15 minute visual observations and ongoing security monitoring    
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
## 2018-10-31 NOTE — ED Notes (Addendum)
Pt. Transferred from Triage to room 23 after dressing out and screening for contraband. Pt. Oriented to [place] including Q15 minute rounds as well as Engineer, drilling for their protection. Patient is alert and oriented, warm and dry in no acute distress. Patient denies HI, and AVH. Pt. Encouraged to let me know if needs arise.

## 2018-10-31 NOTE — ED Notes (Signed)
Pt threatened security that he was leaving, attempted to walk out. Tech encouraged him to go back to his room. Charge nurse addressed the problem.AS

## 2018-10-31 NOTE — ED Notes (Signed)
SOC complete; pt returns to his exam room

## 2018-10-31 NOTE — ED Notes (Signed)
BEHAVIORAL HEALTH ROUNDING Patient sleeping: No. Patient alert and oriented: yes Behavior appropriate: Yes.  ; If no, describe:  Nutrition and fluids offered: yes Toileting and hygiene offered: Yes  Sitter present: q15 minute observations and security camera monitoring Law enforcement present: Yes  ODS  

## 2018-10-31 NOTE — BH Assessment (Signed)
Patient is to be admitted to [facility] BMU by Sheran Fava, NP.  Attending Physician will be Dr. Weber Cooks.   Patient has been assigned to room 320, by [place].   Intake Paper Work has been signed and placed on patient chart.   ER staff is aware of the admission:  Lattie Haw, ER Secretary    Dr. Corky Downs, ER MD   Amy T., Patient's Nurse   Elberta Fortis, Patient Access.
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
## 2018-10-31 NOTE — ED Notes (Signed)
Pt. transfered to BHU without incident after report from. Placed in room and oriented to unit. Pt. informed that for their safety all care areas are designed for safety and monitored by security cameras at all times; and visiting hours explained to patient. Patient verbalizes understanding, and verbal contract for safety obtained.   

## 2018-10-31 NOTE — ED Notes (Signed)
Pt BAL is significantly elevated at the time of consult ( BAL 244 @ 0308 ). Pt will be assessed when she is no longer intoxicated.

## 2018-10-31 NOTE — ED Notes (Signed)
With male officer present, pt removed red tank top, black shorts, black underwear, cellphone, wallet, altoids, smokeless tobacco, black tennis shoes, sunglasses, gold tone necklace, white socks and all placed in labeled pt belonging bag to be secured on nursing unit; pt changed into burgandy paper scrubs

## 2018-10-31 NOTE — ED Notes (Signed)
BEHAVIORAL HEALTH ROUNDING Patient sleeping: Yes.   Patient alert and oriented: eyes closed  Appears asleep Behavior appropriate: Yes.  ; If no, describe:  Nutrition and fluids offered: Yes  Toileting and hygiene offered: sleeping Sitter present: q 15 minute observations and security camera monitoring Law enforcement present: yes  ODS 

## 2018-10-31 NOTE — ED Notes (Addendum)
SOC in progress, pt in dayroom

## 2018-10-31 NOTE — ED Notes (Signed)

## 2018-10-31 NOTE — ED Provider Notes (Signed)
[facility] Emergency Department Provider Note  ____________________________________________   First MD Initiated Contact with Patient 10/31/18 0315     (approximate)  I have reviewed the triage vital signs and the nursing notes.   HISTORY  Chief Complaint Mental Health Problem    HPI Alan Eaton is a 43 y.o. male with alcohol and drug abuse, depression, diabetes who presents with [facility] for IVC.  Patient endorses using alcohol tonight.  He does admit to SI and states that he does have a knife.  He denies HI, auditory or visual hallucinations.  Patient says that he punched a wall because he was very upset.  He found out that his girlfriend was cheating on him and actually had a husband.  He denies any other drugs.  He denies a history of diabetes.           Past Medical History:  Diagnosis Date  . Alcohol abuse   . Bronchitis   . Depression   . Diabetes mellitus without complication ([place])    lost weight diet mgmt at present  . Drug abuse ([place])   . Family history of adverse reaction to anesthesia   . GERD (gastroesophageal reflux disease)   . Gout   . Hypertension   . Morbid obesity ([place])   . Suicide ideation     Patient Active Problem List   Diagnosis Date Noted  . Tachycardia 04/19/2016  . Factitious disorder 01/13/2016  . Unspecified depressive disorder 01/13/2016  . Alcohol use disorder, severe, dependence ([place]) 01/13/2016  . 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 HTN (hypertension) 01/05/2016  . Gout 01/05/2016  . GERD (gastroesophageal reflux disease) 01/05/2016  . Diabetes ([place]) 01/05/2016  . Tobacco use disorder 01/05/2016  . Obesity 10/27/2015    Past Surgical History:  Procedure Laterality Date  . none    . TRICEPS TENDON REPAIR Right 09/26/2018   Procedure: Debridement and primary repair of partial-thickness right distal triceps tendon tear. ;  Surgeon: Corky Mull, MD;  Location: [facility] ORS;  Service:  Orthopedics;  Laterality: Right;  . UPPER GASTROINTESTINAL ENDOSCOPY      Prior to Admission medications   Medication Sig Start Date End Date Taking? Authorizing Provider  HYDROcodone-acetaminophen (NORCO/VICODIN) 5-325 MG tablet Take 1-2 tablets by mouth every 6 (six) hours as needed for moderate pain. 09/26/18 09/26/19  Poggi, Marshall Cork, MD  hydrOXYzine (ATARAX/VISTARIL) 25 MG tablet Take 1 tablet (25 mg total) 3 (three) times daily as needed by mouth for itching. Patient taking differently: Take 50 mg by mouth 2 (two) times a day.  01/24/17   Mikey College, NP  metoprolol succinate (TOPROL-XL) 25 MG 24 hr tablet Take 25 mg by mouth daily.    [provider]  NIFEdipine (PROCARDIA XL/NIFEDICAL XL) 60 MG 24 hr tablet Take 60 mg by mouth daily.    [provider]  pantoprazole (PROTONIX) 40 MG tablet Take 40 mg by mouth 2 (two) times a day.    [provider]  promethazine (PHENERGAN) 25 MG tablet Take 1 tablet (25 mg total) by mouth every 6 (six) hours as needed for nausea or vomiting. Patient taking differently: Take 25 mg by mouth 2 (two) times a day.  09/14/16   Mikey College, NP  VENTOLIN HFA 108 (90 Base) MCG/ACT inhaler INHALE 2 PUFFS BY MOUTH EVERY 4 TO 6 HOURS AS NEEDED. Patient taking differently: Inhale 2 puffs into the lungs every 4 (four) hours as needed for wheezing or shortness of  breath.  11/18/15   Arlis Porta., MD  vitamin B-12 (CYANOCOBALAMIN) 1000 MCG tablet Take 1,000 mcg by mouth daily.    [provider]    Allergies Bee venom and Bactrim [sulfamethoxazole-trimethoprim]  Family History  Problem Relation Age of Onset  . Hypertension Other   . Diabetes Mellitus II Other     Social History Social History   Tobacco Use  . Smoking status: Current Every Day Smoker    Packs/day: 1.00    Types: Cigarettes  . Smokeless tobacco: Current User    Types: Snuff  Substance Use Topics  . Alcohol use: Yes    Alcohol/week:  0.0 standard drinks    Comment: 14 beers today  . Drug use: Yes    Frequency: 1.0 times per week    Types: Marijuana, Cocaine    Comment: last used cocaine Monday; last smoked marijuana monday      Review of Systems Constitutional: No fever/chills Eyes: No visual changes. ENT: No sore throat. Cardiovascular: Denies chest pain. Respiratory: Denies shortness of breath. Gastrointestinal: No abdominal pain.  No nausea, no vomiting.  No diarrhea.  No constipation. Genitourinary: Negative for dysuria. Musculoskeletal: Negative for back pain. Skin: Negative for rash. Neurological: Negative for headaches, focal weakness or numbness. Psych: Denies SI, upset All other ROS negative ____________________________________________   PHYSICAL EXAM:  VITAL SIGNS: ED Triage Vitals  Enc Vitals Group     BP 10/31/18 0205 (!) 162/109     Pulse Rate 10/31/18 0205 (!) 118     Resp 10/31/18 0205 20     Temp 10/31/18 0205 97.7 F (36.5 C)     Temp Source 10/31/18 0205 Oral     SpO2 10/31/18 0205 97 %     Weight 10/31/18 0200 275 lb (124.7 kg)     Height 10/31/18 0200 6\' 1"  (1.854 m)     Head Circumference --      Peak Flow --      Pain Score 10/31/18 0200 0     Pain Loc --      Pain Edu? --      Excl. in [place]? --      Constitutional: Alert and oriented. Well appearing and in no acute distress. Eyes: Conjunctivae are normal. EOMI. Head: Atraumatic. Nose: No congestion/rhinnorhea. Mouth/Throat: Mucous membranes are moist.   Neck: No stridor. Trachea Midline. FROM Cardiovascular: Slightly tachycardic, regular rhythm. Grossly normal heart sounds.  Good peripheral circulation. Respiratory: Normal respiratory effort.  No retractions. Lungs CTAB. Gastrointestinal: Soft and nontender. No distention. No abdominal bruits.  Musculoskeletal: Pain on his right hand but no obvious deformity.  Good distal pulse.  No open abrasions. Neurologic:  Normal speech and language. No gross focal neurologic  deficits are appreciated.  Skin:  Skin is warm, dry and intact. No rash noted. Psychiatric: Mood and affect are normal. Speech and behavior are normal. GU: Deferred   ____________________________________________   LABS (all labs ordered are listed, but only abnormal results are displayed)  Labs Reviewed  COMPREHENSIVE METABOLIC PANEL - Abnormal; Notable for the following components:      Result Value   CO2 21 (*)    Creatinine, Ser 1.43 (*)    Total Protein 8.2 (*)    GFR calc non Af Amer 59 (*)    All other components within normal limits  ETHANOL - Abnormal; Notable for the following components:   Alcohol, Ethyl (B) 244 (*)    All other components within normal limits  ACETAMINOPHEN LEVEL -  Abnormal; Notable for the following components:   Acetaminophen (Tylenol), Serum <10 (*)    All other components within normal limits  CBC - Abnormal; Notable for the following components:   WBC 10.6 (*)    All other components within normal limits  SALICYLATE LEVEL  URINE DRUG SCREEN, QUALITATIVE ([facility] ONLY)   ____________________________________________  RADIOLOGY Robert Bellow, personally viewed and evaluated these images (plain radiographs) as part of my medical decision making, as well as reviewing the written report by the radiologist.  ED MD interpretation: X-ray without evidence of fracture  Official radiology report(s): Dg Hand 2 View Right  Result Date: 10/31/2018 CLINICAL DATA:  43 year old male with right hand pain and swelling after punching a wall. EXAM: RIGHT HAND - 2 VIEW COMPARISON:  Right hand radiograph dated 12/16/2016 FINDINGS: There is no acute fracture or dislocation. The bones are well mineralized. No significant arthritic changes. There is a negative ulnar variance. The soft tissues are unremarkable. IMPRESSION: No acute fracture or dislocation. Electronically Signed   By: Anner Crete M.D.   On: 10/31/2018 03:46     ____________________________________________   PROCEDURES  Procedure(s) performed (including Critical Care):  Procedures   ____________________________________________   INITIAL IMPRESSION / ASSESSMENT AND PLAN / ED COURSE  Alan Eaton was evaluated in Emergency Department on 10/31/2018 for the symptoms described in the history of present illness. He was evaluated in the context of the global COVID-19 pandemic, which necessitated consideration that the patient might be at risk for infection with the SARS-CoV-2 virus that causes COVID-19. Institutional protocols and algorithms that pertain to the evaluation of patients at risk for COVID-19 are in a state of rapid change based on information released by regulatory bodies including the CDC and federal and state organizations. These policies and algorithms were followed during the patient's care in the ED.    Patient is a 43 year old who presents with alcohol use and SI the setting of IVC paperwork.  Patient did punch a wall while here.  No obvious deformity on examination and x-ray will be obtained to rule out fracture.  Patient denies any other medical complaints such as dysuria, chest pain, abdominal pain.  Patient is requesting his home trazodone to help with sleep.  Pt is without any acute medical complaints. No exam findings to suggest medical cause of current presentation. Will order psychiatric screening labs and discuss further w/ psychiatric service.  D/d includes but is not limited to psychiatric disease, behavioral/personality disorder, inadequate socioeconomic support, medical.  Based on HPI, exam, unremarkable labs, no concern for acute medical problem at this time. No rigidity, clonus, hyperthermia, focal neurologic deficit, diaphoresis, tachycardia, meningismus, ataxia, gait abnormality or other finding to suggest this visit represents a non-psychiatric problem. Screening labs reviewed.    Given this, pt medically  cleared, to be dispositioned per Psych.         ____________________________________________   FINAL CLINICAL IMPRESSION(S) / ED DIAGNOSES   Final diagnoses:  Suicide ideation      MEDICATIONS GIVEN DURING THIS VISIT:  Medications  traZODone (DESYREL) tablet 50 mg (has no administration in time range)  acetaminophen (TYLENOL) tablet 650 mg (650 mg Oral Given 10/31/18 0359)  traZODone (DESYREL) tablet 50 mg (50 mg Oral Given 10/31/18 0417)     ED Discharge Orders    None       Note:  This document was prepared using Dragon voice recognition software and may include unintentional dictation errors.   Vanessa [place], MD 10/31/18 [phone]

## 2018-10-31 NOTE — Progress Notes (Signed)
D - Patient was in the day room upon arrival to the unit. Patient was pleasant during assessment. Patient denies SI/HI/AVH with this Probation officer. Patient says he has chronic pain in his elbow from a previous surgery. Patient says he has some anxiety and depression but couldn't rate them. Patient was observed acting appropriately with staff and peers on the unit.   A - Patient didn't have any medications scheduled this evening. Patient was compliant with procedures on the unit. Patient given education. Patient given support and encouragement to be interactive in his treatment plan. Patient informed to let staff know if there are any issues or problems on the unit.   R - Patient being monitored Q 15 minutes for safety per unit protocol. Patient remains safe on the unit.

## 2018-10-31 NOTE — Plan of Care (Signed)
  Problem: Education: Goal: Knowledge of [facility] Education information/materials will improve Outcome: Progressing  Instructed patient  on   [place] Education  and unit programing , patient able to verbalize understanding
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
## 2018-10-31 NOTE — ED Notes (Signed)
ED BHU [place] Is the patient under IVC or is there intent for IVC: Yes.   Is the patient medically cleared: Yes.   Is there vacancy in the ED BHU: Yes.   Is the population mix appropriate for patient: Yes.   Is the patient awaiting placement in inpatient or outpatient setting: Yes.   Has the patient had a psychiatric consult: Yes.   Survey of unit performed for contraband, proper placement and condition of furniture, tampering with fixtures in bathroom, shower, and each patient room: Yes.  ; Findings:  APPEARANCE/BEHAVIOR Calm and cooperative NEURO ASSESSMENT Orientation: oriented x3  Denies pain Hallucinations: No.None noted (Hallucinations)  Denies  Speech: Normal Gait: normal RESPIRATORY ASSESSMENT Even  Unlabored respirations  CARDIOVASCULAR ASSESSMENT Pulses equal   regular rate  Skin warm and dry   GASTROINTESTINAL ASSESSMENT no GI complaint EXTREMITIES Full ROM  PLAN OF CARE Provide calm/safe environment. Vital signs assessed twice daily. ED BHU Assessment once each 12-hour shift.  Assure the ED provider has rounded once each shift. Provide and encourage hygiene. Provide redirection as needed. Assess for escalating behavior; address immediately and inform ED provider.  Assess family dynamic and appropriateness for visitation as needed: Yes.  ; If necessary, describe findings:  Educate the patient/family about BHU procedures/visitation: Yes.  ; If necessary, describe findings:

## 2018-11-01 DIAGNOSIS — F4325 Adjustment disorder with mixed disturbance of emotions and conduct: Principal | ICD-10-CM

## 2018-11-01 MED ORDER — METOPROLOL SUCCINATE ER 25 MG PO TB24
25.0000 mg | ORAL_TABLET | Freq: Every day | ORAL | Status: DC
  Administered 2018-11-01 – 2018-11-02 (×2): 25 mg via ORAL
  Filled 2018-11-01 (×2): qty 1

## 2018-11-01 MED ORDER — PANTOPRAZOLE SODIUM 40 MG PO TBEC
40.0000 mg | DELAYED_RELEASE_TABLET | Freq: Two times a day (BID) | ORAL | Status: DC
  Administered 2018-11-01 – 2018-11-02 (×2): 40 mg via ORAL
  Filled 2018-11-01 (×2): qty 1

## 2018-11-01 MED ORDER — HYDROXYZINE HCL 50 MG PO TABS
50.0000 mg | ORAL_TABLET | Freq: Two times a day (BID) | ORAL | Status: DC
  Administered 2018-11-01 – 2018-11-02 (×2): 50 mg via ORAL
  Filled 2018-11-01 (×2): qty 1

## 2018-11-01 MED ORDER — ALBUTEROL SULFATE HFA 108 (90 BASE) MCG/ACT IN AERS
2.0000 | INHALATION_SPRAY | Freq: Four times a day (QID) | RESPIRATORY_TRACT | Status: DC | PRN
  Filled 2018-11-01: qty 6.7

## 2018-11-01 MED ORDER — PROMETHAZINE HCL 25 MG PO TABS
25.0000 mg | ORAL_TABLET | Freq: Two times a day (BID) | ORAL | Status: DC
  Administered 2018-11-01 – 2018-11-02 (×2): 25 mg via ORAL
  Filled 2018-11-01 (×2): qty 1

## 2018-11-01 MED ORDER — NIFEDIPINE ER 60 MG PO TB24
60.0000 mg | ORAL_TABLET | Freq: Every day | ORAL | Status: DC
  Administered 2018-11-01 – 2018-11-02 (×2): 60 mg via ORAL
  Filled 2018-11-01 (×3): qty 1

## 2018-11-01 NOTE — Progress Notes (Signed)
Recreation Therapy Notes   Date: 11/01/2018  Time: 9:30 am  Location: Craft Room  Behavioral response: Appropriate  Intervention Topic: Goals  Discussion/Intervention:  Group content on today was focused on goals. Patients described what goals are and how they define goals. Individuals expressed how they go about setting goals and reaching them. The group identified how important goals are and if they make short term goals to reach long term goals. Patients described how many goals they work on at a time and what affects them not reaching their goal. Individuals described how much time they put into planning and obtaining their goals. The group participated in the intervention "My Goal Board" and made personal goal boards to help them achieve their goal. Clinical Observations/Feedback:  Patient came to group and defined goals as getting better. He stated that goals are things you need to get done. Participant identified his goal as recovery. Individual was social with peers and staff while participating in the intervention.  Nasra Counce LRT/CTRS         Chantil Bari 11/01/2018 12:32 PM

## 2018-11-01 NOTE — Tx Team (Signed)
Initial Treatment Plan 11/01/2018 8:07 AM [name] VYX:[id number]    PATIENT STRESSORS: Health problems Marital or family conflict Medication change or noncompliance Substance abuse   PATIENT STRENGTHS: Ability for insight Active sense of humor Average or above average intelligence Capable of independent living Communication skills Financial means Supportive family/friends   PATIENT IDENTIFIED PROBLEMS: Suicide  8/11//2020  Depression  10/31/18  Stressors  Girlfriend break Up 10/31/2018                 DISCHARGE CRITERIA:  Ability to meet basic life and health needs Adequate post-discharge living arrangements Improved stabilization in mood, thinking, and/or behavior  PRELIMINARY DISCHARGE PLAN: Outpatient therapy Return to previous living arrangement  PATIENT/FAMILY INVOLVEMENT: This treatment plan has been presented to and reviewed with the patient, Alan Eaton, and/or family member,  .  The patient and family have been given the opportunity to ask questions and make suggestions.  Leodis Liverpool, RN 11/01/2018, 8:07 AM

## 2018-11-01 NOTE — Progress Notes (Signed)
D: Patient stated slept fair last night .Stated appetite fair and energy level  normal. Stated concentration good . Stated on Depression scale 7 , hopeless 6 and anxiety 10 .( low 0-10 high) Denies suicidal  homicidal ideations  .  No auditory hallucinations  No pain concerns . Appropriate ADL'S. Interacting with peers and staff. Patient aware of information  received able to verbalize understanding.  Denies suicidal ideation. Patient working on Paramedic Techniques  around anxiety  compliant  with medication  Attending unit programing . Voice no concerns around wake or sleep cycle .attending unit programing to verbalize feelings .  Continue to voice of  Feeling associated with girlfriend  And how hurt he felt .  A: Encourage patient participation with unit programming . Instruction  Given on  Medication , verbalize understanding.  R: Voice no other concerns. Staff continue to monitor

## 2018-11-01 NOTE — [facility] Group Notes (Signed)
LCSW Group Therapy Note  11/01/2018 1:00 PM  Type of Therapy/Topic:  Group Therapy:  Emotion Regulation  Participation Level:  Active   Description of Group:   The purpose of this group is to assist patients in learning to regulate negative emotions and experience positive emotions. Patients will be guided to discuss ways in which they have been vulnerable to their negative emotions. These vulnerabilities will be juxtaposed with experiences of positive emotions or situations, and patients will be challenged to use positive emotions to combat negative ones. Special emphasis will be placed on coping with negative emotions in conflict situations, and patients will process healthy conflict resolution skills.  Therapeutic Goals: 1. Patient will identify two positive emotions or experiences to reflect on in order to balance out negative emotions 2. Patient will label two or more emotions that they find the most difficult to experience 3. Patient will demonstrate positive conflict resolution skills through discussion and/or role plays  Summary of Patient Progress: Patient was present and attentive in group.  Patient was supportive of other group members as well.  Patient shared how he reacts when angered.  Patient encouraged others to use their coping skills.   Therapeutic Modalities:   Cognitive Behavioral Therapy Feelings Identification Dialectical Behavioral Therapy  Alan Eaton, MSW, LCSW 11/01/2018 3:15 PM

## 2018-11-01 NOTE — Tx Team (Addendum)
Interdisciplinary Treatment and Diagnostic Plan Update  11/01/2018 Time of Session: 230PM Alan Eaton MRN: [id number]  Principal Diagnosis: <principal problem not specified>  Secondary Diagnoses: Active Problems:   MDD (major depressive disorder), severe ([facility])   Current Medications:  Current Facility-Administered Medications  Medication Dose Route Frequency Provider Last Rate Last Dose  . acetaminophen (TYLENOL) tablet 650 mg  650 mg Oral Q6H PRN Money, [name], [name]   650 mg at 11/01/18 1022  . albuterol (VENTOLIN HFA) 108 (90 Base) MCG/ACT inhaler 2 puff  2 puff Inhalation Q6H PRN Clapacs, John T, MD      . alum & mag hydroxide-simeth (MAALOX/MYLANTA) 200-200-20 MG/5ML suspension 30 mL  30 mL Oral Q4H PRN Money, [name], [name]      . hydrOXYzine (ATARAX/VISTARIL) tablet 25 mg  25 mg Oral TID PRN Money, [name], [name]      . hydrOXYzine (ATARAX/VISTARIL) tablet 50 mg  50 mg Oral BID Clapacs, John T, MD      . magnesium hydroxide (MILK OF MAGNESIA) suspension 30 mL  30 mL Oral Daily PRN Money, [name], [name]      . metoprolol succinate (TOPROL-XL) 24 hr tablet 25 mg  25 mg Oral Daily Clapacs, John T, MD      . NIFEdipine (ADALAT CC) 24 hr tablet 60 mg  60 mg Oral Daily Clapacs, John T, MD      . pantoprazole (PROTONIX) EC tablet 40 mg  40 mg Oral BID Clapacs, John T, MD      . promethazine (PHENERGAN) tablet 25 mg  25 mg Oral BID Clapacs, John T, MD      . traZODone (DESYREL) tablet 50 mg  50 mg Oral QHS PRN Money, [name], [name]       PTA Medications: Medications Prior to Admission  Medication Sig Dispense Refill Last Dose  . HYDROcodone-acetaminophen (NORCO/VICODIN) 5-325 MG tablet Take 1-2 tablets by mouth every 6 (six) hours as needed for moderate pain. 40 tablet 0   . hydrOXYzine (ATARAX/VISTARIL) 25 MG tablet Take 1 tablet (25 mg total) 3 (three) times daily as needed by mouth for itching. (Patient taking differently: Take 50 mg by mouth 2 (two) times a day. ) 30 tablet 0   .  metoprolol succinate (TOPROL-XL) 25 MG 24 hr tablet Take 25 mg by mouth daily.     Marland Kitchen NIFEdipine (PROCARDIA XL/NIFEDICAL XL) 60 MG 24 hr tablet Take 60 mg by mouth daily.     . pantoprazole (PROTONIX) 40 MG tablet Take 40 mg by mouth 2 (two) times a day.     . promethazine (PHENERGAN) 25 MG tablet Take 1 tablet (25 mg total) by mouth every 6 (six) hours as needed for nausea or vomiting. (Patient taking differently: Take 25 mg by mouth 2 (two) times a day. ) 30 tablet 0   . VENTOLIN HFA 108 (90 Base) MCG/ACT inhaler INHALE 2 PUFFS BY MOUTH EVERY 4 TO 6 HOURS AS NEEDED. (Patient taking differently: Inhale 2 puffs into the lungs every 4 (four) hours as needed for wheezing or shortness of breath. ) 18 g 12   . vitamin B-12 (CYANOCOBALAMIN) 1000 MCG tablet Take 1,000 mcg by mouth daily.       Patient Stressors: Health problems Marital or family conflict Medication change or noncompliance Substance abuse  Patient Strengths: Ability for insight Active sense of humor Average or above average intelligence Capable of independent living Child psychotherapist Supportive family/friends  Treatment Modalities: Medication Management, Group therapy,  Case management,  1 to 1 session with clinician, Psychoeducation, Recreational therapy.   Physician Treatment Plan for Primary Diagnosis: <principal problem not specified> Long Term Goal(s):     Short Term Goals:    Medication Management: Evaluate patient's response, side effects, and tolerance of medication regimen.  Therapeutic Interventions: 1 to 1 sessions, Unit Group sessions and Medication administration.  Evaluation of Outcomes: Progressing  Physician Treatment Plan for Secondary Diagnosis: Active Problems:   MDD (major depressive disorder), severe ([place])  Long Term Goal(s):     Short Term Goals:       Medication Management: Evaluate patient's response, side effects, and tolerance of medication regimen.  Therapeutic  Interventions: 1 to 1 sessions, Unit Group sessions and Medication administration.  Evaluation of Outcomes: Progressing   RN Treatment Plan for Primary Diagnosis: <principal problem not specified> Long Term Goal(s): Knowledge of disease and therapeutic regimen to maintain health will improve  Short Term Goals: Ability to demonstrate self-control, Ability to participate in decision making will improve, Ability to verbalize feelings will improve, Ability to disclose and discuss suicidal ideas and Ability to identify and develop effective coping behaviors will improve  Medication Management: RN will administer medications as ordered by provider, will assess and evaluate patient's response and provide education to patient for prescribed medication. RN will report any adverse and/or side effects to prescribing provider.  Therapeutic Interventions: 1 on 1 counseling sessions, Psychoeducation, Medication administration, Evaluate responses to treatment, Monitor vital signs and CBGs as ordered, Perform/monitor CIWA, COWS, AIMS and Fall Risk screenings as ordered, Perform wound care treatments as ordered.  Evaluation of Outcomes: Progressing   LCSW Treatment Plan for Primary Diagnosis: <principal problem not specified> Long Term Goal(s): Safe transition to appropriate next level of care at discharge, Engage patient in therapeutic group addressing interpersonal concerns.  Short Term Goals: Engage patient in aftercare planning with referrals and resources, Identify triggers associated with mental health/substance abuse issues and Increase skills for wellness and recovery  Therapeutic Interventions: Assess for all discharge needs, 1 to 1 time with Social worker, Explore available resources and support systems, Assess for adequacy in community support network, Educate family and significant other(s) on suicide prevention, Complete Psychosocial Assessment, Interpersonal group therapy.  Evaluation of  Outcomes: Progressing   Progress in Treatment: Attending groups: Yes. Participating in groups: Yes. Taking medication as prescribed: Yes. Toleration medication: Yes. Family/Significant other contact made: Yes, individual(s) contacted:  pts mother Patient understands diagnosis: Yes. Discussing patient identified problems/goals with staff: Yes. Medical problems stabilized or resolved: Yes. Denies suicidal/homicidal ideation: Yes. Issues/concerns per patient self-inventory: No. Other: N/A  New problem(s) identified: No, Describe:  none  New Short Term/Long Term Goal(s):Detox, elimination of AVH/symptoms of psychosis, medication management for mood stabilization; elimination of SI thoughts; development of comprehensive mental wellness/sobriety plan.   Patient Goals:  "To stay here and get better this time"  Discharge Plan or Barriers: [facility] pamphlet, Mobile Crisis information, and AA/NA information provided to patient for additional community support and resources. CSW will send referral to [facility] per pts request.  Reason for Continuation of Hospitalization: Depression Medication stabilization Withdrawal symptoms  Estimated Length of Stay: 5-7 days  Recreational Therapy: Patient: N/A Patient Goal: Patient will engage in groups without prompting or encouragement from LRT x3 group sessions within 5 recreation therapy group sessions   Attendees: Patient: Alan Eaton 11/01/2018 3:22 PM  Physician: Dr Weber Cooks MD 11/01/2018 3:22 PM  Nursing:  11/01/2018 3:22 PM  RN Care Manager: 11/01/2018 3:22 PM  Social Worker:  Minette Brine Moton LCSW 11/01/2018 3:22 PM  Recreational Therapist: Roanna Epley CTRS LRT 11/01/2018 3:22 PM  Other: Assunta Curtis LCSW 11/01/2018 3:22 PM  Other:  11/01/2018 3:22 PM  Other: 11/01/2018 3:22 PM    Scribe for Treatment Team: Mariann Laster Moton, LCSW 11/01/2018 3:22 PM

## 2018-11-01 NOTE — Plan of Care (Signed)
Patient aware of information  received able to verbalize understanding.  Denies suicidal ideation. Patient working on Paramedic Techniques  around anxiety  compliant  with medication  Attending unit programing . Voice no concerns around wake or sleep cycle .attending unit programing to verbalize feelings .  Patient finding it difficult to problem solve .   Problem: Education: Goal: Ability to make informed decisions regarding treatment will improve Outcome: Progressing   Problem: Education: Goal: Knowledge of [place] General Education information/materials will improve Outcome: Progressing   Problem: Coping: Goal: Ability to verbalize frustrations and anger appropriately will improve Outcome: Progressing Goal: Ability to demonstrate self-control will improve Outcome: Progressing   Problem: Health Behavior/Discharge Planning: Goal: Identification of resources available to assist in meeting health care needs will improve Outcome: Progressing Goal: Compliance with treatment plan for underlying cause of condition will improve Outcome: Progressing   Problem: Physical Regulation: Goal: Ability to maintain clinical measurements within normal limits will improve Outcome: Progressing   Problem: Safety: Goal: Periods of time without injury will increase Outcome: Progressing   Problem: Coping: Goal: Coping ability will improve Outcome: Progressing   Problem: Safety: Goal: Ability to disclose and discuss suicidal ideas will improve Outcome: Progressing Goal: Ability to identify and utilize support systems that promote safety will improve Outcome: Progressing

## 2018-11-01 NOTE — H&P (Signed)
Psychiatric Admission Assessment Adult  Patient Identification: Alan Eaton MRN:  [id number] Date of Evaluation:  11/01/2018 Chief Complaint:  major depression Principal Diagnosis: Adjustment disorder with mixed disturbance of emotions and conduct Diagnosis:  Principal Problem:   Adjustment disorder with mixed disturbance of emotions and conduct Active Problems:   HTN (hypertension)   Alcohol use disorder, severe, dependence ([place])  History of Present Illness: Patient seen chart reviewed.  Patient presented to the emergency room intoxicated stating he was having some suicidal thoughts.  Patient reported that he had just found out that his girlfriend was actually married.  This upset him so much that he started drinking for the first time in months and then had suicidal thoughts.  Patient was cooperative with admission and had not tried to hurt himself.  On interview today he is now sobered up and states that his mood leading up to these events was fine.  Denies recent symptoms of depression.  Denies psychotic symptoms.  Claims that he has not been drinking in months thanks to being on long-acting naltrexone.  Patient denies any current suicidal or homicidal thoughts.  He reports that he still goes to his primary care doctor and takes his regular medication but is not on any antidepressants those having been found to not be particularly helpful for him in the past.  Currently asymptomatic other than joint pain Associated Signs/Symptoms: Depression Symptoms:  suicidal thoughts without plan, (Hypo) Manic Symptoms:  Distractibility, Anxiety Symptoms:  Excessive Worry, Psychotic Symptoms:  None PTSD Symptoms: Negative Total Time spent with patient: 1 hour  Past Psychiatric History: Patient has a history of presentation to the hospital with very similar symptoms and situations.  He has not been seen by [place] in the past 3 years but before that was a frequent fixture of admissions and emergency room  visits usually intoxicated at times making dramatic claims while intoxicated but then coming back to a level baseline when sober.  Antidepressant medicine has not been shown to be of much benefit to him as when he is not drinking he usually is not symptomatic.  Is the patient at risk to self? No.  Has the patient been a risk to self in the past 6 months? No.  Has the patient been a risk to self within the distant past? No.  Is the patient a risk to others? No.  Has the patient been a risk to others in the past 6 months? No.  Has the patient been a risk to others within the distant past? No.   Prior Inpatient Therapy:   Prior Outpatient Therapy:    Alcohol Screening: 1. How often do you have a drink containing alcohol?: Never 2. How many drinks containing alcohol do you have on a typical day when you are drinking?: 1 or 2 3. How often do you have six or more drinks on one occasion?: Never AUDIT-C Score: 0 4. How often during the last year have you found that you were not able to stop drinking once you had started?: Never 5. How often during the last year have you failed to do what was normally expected from you becasue of drinking?: Never 6. How often during the last year have you needed a first drink in the morning to get yourself going after a heavy drinking session?: Never 7. How often during the last year have you had a feeling of guilt of remorse after drinking?: Never 8. How often during the last year have you been unable to remember  what happened the night before because you had been drinking?: Never 9. Have you or someone else been injured as a result of your drinking?: No 10. Has a relative or friend or a doctor or another health worker been concerned about your drinking or suggested you cut down?: Yes, but not in the last year Alcohol Use Disorder Identification Test Final Score (AUDIT): 2 Alcohol Brief Interventions/Follow-up: Patient Refused Substance Abuse History in the last 12  months:  Yes.   Consequences of Substance Abuse: Medical Consequences:  Worsening mood symptoms Previous Psychotropic Medications: Yes  Psychological Evaluations: Yes  Past Medical History:  Past Medical History:  Diagnosis Date  . Alcohol abuse   . Bronchitis   . Depression   . Diabetes mellitus without complication ([place])    lost weight diet mgmt at present  . Drug abuse ([place])   . Family history of adverse reaction to anesthesia   . GERD (gastroesophageal reflux disease)   . Gout   . Hypertension   . Morbid obesity ([place])   . Suicide ideation     Past Surgical History:  Procedure Laterality Date  . none    . TRICEPS TENDON REPAIR Right 09/26/2018   Procedure: Debridement and primary repair of partial-thickness right distal triceps tendon tear. ;  Surgeon: Corky Mull, MD;  Location: [facility] ORS;  Service: Orthopedics;  Laterality: Right;  . UPPER GASTROINTESTINAL ENDOSCOPY     Family History:  Family History  Problem Relation Age of Onset  . Hypertension Other   . Diabetes Mellitus II Other    Family Psychiatric  History: None reported Tobacco Screening: Have you used any form of tobacco in the last 30 days? (Cigarettes, Smokeless Tobacco, Cigars, and/or Pipes): Yes Tobacco use, Select all that apply: 5 or more cigarettes per day Are you interested in Tobacco Cessation Medications?: Yes, will notify MD for an order Counseled patient on smoking cessation including recognizing danger situations, developing coping skills and basic information about quitting provided: Yes Social History:  Social History   Substance and Sexual Activity  Alcohol Use Yes  . Alcohol/week: 0.0 standard drinks   Comment: 14 beers today     Social History   Substance and Sexual Activity  Drug Use Yes  . Frequency: 1.0 times per week  . Types: Marijuana, Cocaine   Comment: last used cocaine Monday; last smoked marijuana monday    Additional Social History: Marital status: Divorced Divorced,  when?: "few years ago" What types of issues is patient dealing with in the relationship?: Pt recently got out of a relationship due to finding out she was married Are you sexually active?: Yes What is your sexual orientation?: Heterosexual Has your sexual activity been affected by drugs, alcohol, medication, or emotional stress?: pt denies Does patient have children?: Yes How many children?: 6 How is patient's relationship with their children?: "they are alright, they live with their mothers"                         Allergies:   Allergies  Allergen Reactions  . Bee Venom Swelling  . Bactrim [Sulfamethoxazole-Trimethoprim] Swelling and Rash   Lab Results:  Results for orders placed or performed during the hospital encounter of 10/31/18 (from the past 48 hour(s))  Comprehensive metabolic panel     Status: Abnormal   Collection Time: 10/31/18  2:09 AM  Result Value Ref Range   Sodium 138 135 - 145 mmol/L   Potassium 3.7 3.5 -  5.1 mmol/L   Chloride 106 98 - 111 mmol/L   CO2 21 (L) 22 - 32 mmol/L   Glucose, Bld 98 70 - 99 mg/dL   BUN 11 6 - 20 mg/dL   Creatinine, Ser 1.43 (H) 0.61 - 1.24 mg/dL   Calcium 9.0 8.9 - 10.3 mg/dL   Total Protein 8.2 (H) 6.5 - 8.1 g/dL   Albumin 4.6 3.5 - 5.0 g/dL   AST 25 15 - 41 U/L   ALT 25 0 - 44 U/L   Alkaline Phosphatase 65 38 - 126 U/L   Total Bilirubin 0.5 0.3 - 1.2 mg/dL   GFR calc non Af Amer 59 (L) >60 mL/min   GFR calc Af Amer >60 >60 mL/min   Anion gap 11 5 - 15    Comment: Performed at [facility], [address] [place], [place] [address]  Ethanol     Status: Abnormal   Collection Time: 10/31/18  2:09 AM  Result Value Ref Range   Alcohol, Ethyl (B) 244 (H) <10 mg/dL    Comment: (NOTE) Lowest detectable limit for serum alcohol is 10 mg/dL. For medical purposes only. Performed at [facility], [address] [place], [place] [address]   Salicylate level     Status: None   Collection Time: 10/31/18   2:09 AM  Result Value Ref Range   Salicylate Lvl <5.3 2.8 - 30.0 mg/dL    Comment: Performed at [facility], [address] [place], [place] [address]  Acetaminophen level     Status: Abnormal   Collection Time: 10/31/18  2:09 AM  Result Value Ref Range   Acetaminophen (Tylenol), Serum <10 (L) 10 - 30 ug/mL    Comment: (NOTE) Therapeutic concentrations vary significantly. A range of 10-30 ug/mL  may be an effective concentration for many patients. However, some  are best treated at concentrations outside of this range. Acetaminophen concentrations >150 ug/mL at 4 hours after ingestion  and >50 ug/mL at 12 hours after ingestion are often associated with  toxic reactions. Performed at [facility], [address] [place], [place] [address]   cbc     Status: Abnormal   Collection Time: 10/31/18  2:09 AM  Result Value Ref Range   WBC 10.6 (H) 4.0 - 10.5 K/uL   RBC 4.98 4.22 - 5.81 MIL/uL   Hemoglobin 14.6 13.0 - 17.0 g/dL   HCT 43.2 39.0 - 52.0 %   MCV 86.7 80.0 - 100.0 fL   MCH 29.3 26.0 - 34.0 pg   MCHC 33.8 30.0 - 36.0 g/dL   RDW 13.5 11.5 - 15.5 %   Platelets 262 150 - 400 K/uL   nRBC 0.0 0.0 - 0.2 %    Comment: Performed at [facility], [address], [place], [place] [address]  Urine Drug Screen, Qualitative     Status: None   Collection Time: 10/31/18  2:09 AM  Result Value Ref Range   Tricyclic, Ur Screen NONE DETECTED NONE DETECTED   Amphetamines, Ur Screen NONE DETECTED NONE DETECTED   MDMA (Ecstasy)Ur Screen NONE DETECTED NONE DETECTED   Cocaine Metabolite,Ur [place] NONE DETECTED NONE DETECTED   Opiate, Ur Screen NONE DETECTED NONE DETECTED   Phencyclidine (PCP) Ur S NONE DETECTED NONE DETECTED   Cannabinoid 50 Ng, Ur [place] NONE DETECTED NONE DETECTED   Barbiturates, Ur Screen NONE DETECTED NONE DETECTED   Benzodiazepine, Ur Scrn NONE DETECTED NONE DETECTED   Methadone Scn, Ur NONE DETECTED NONE DETECTED  Comment: (NOTE) Tricyclics +  metabolites, urine    Cutoff 1000 ng/mL Amphetamines + metabolites, urine  Cutoff 1000 ng/mL MDMA (Ecstasy), urine              Cutoff 500 ng/mL Cocaine Metabolite, urine          Cutoff 300 ng/mL Opiate + metabolites, urine        Cutoff 300 ng/mL Phencyclidine (PCP), urine         Cutoff 25 ng/mL Cannabinoid, urine                 Cutoff 50 ng/mL Barbiturates + metabolites, urine  Cutoff 200 ng/mL Benzodiazepine, urine              Cutoff 200 ng/mL Methadone, urine                   Cutoff 300 ng/mL The urine drug screen provides only a preliminary, unconfirmed analytical test result and should not be used for non-medical purposes. Clinical consideration and professional judgment should be applied to any positive drug screen result due to possible interfering substances. A more specific alternate chemical method must be used in order to obtain a confirmed analytical result. Gas chromatography / mass spectrometry (GC/MS) is the preferred confirmat ory method. Performed at [facility], [address] [place], [place] [address]   SARS Coronavirus 2 [facility] order, Performed in [facility] hospital lab) Nasopharyngeal Nasopharyngeal Swab     Status: None   Collection Time: 10/31/18  4:28 AM   Specimen: Nasopharyngeal Swab  Result Value Ref Range   SARS Coronavirus 2 NEGATIVE NEGATIVE    Comment: (NOTE) If result is NEGATIVE SARS-CoV-2 target nucleic acids are NOT DETECTED. The SARS-CoV-2 RNA is generally detectable in upper and lower  respiratory specimens during the acute phase of infection. The lowest  concentration of SARS-CoV-2 viral copies this assay can detect is 250  copies / mL. A negative result does not preclude SARS-CoV-2 infection  and should not be used as the sole basis for treatment or other  patient management decisions.  A negative result may occur with  improper specimen collection / handling, submission of specimen other  than nasopharyngeal swab,  presence of viral mutation(s) within the  areas targeted by this assay, and inadequate number of viral copies  (<250 copies / mL). A negative result must be combined with clinical  observations, patient history, and epidemiological information. If result is POSITIVE SARS-CoV-2 target nucleic acids are DETECTED. The SARS-CoV-2 RNA is generally detectable in upper and lower  respiratory specimens dur ing the acute phase of infection.  Positive  results are indicative of active infection with SARS-CoV-2.  Clinical  correlation with patient history and other diagnostic information is  necessary to determine patient infection status.  Positive results do  not rule out bacterial infection or co-infection with other viruses. If result is PRESUMPTIVE POSTIVE SARS-CoV-2 nucleic acids MAY BE PRESENT.   A presumptive positive result was obtained on the submitted specimen  and confirmed on repeat testing.  While 2019 novel coronavirus  (SARS-CoV-2) nucleic acids may be present in the submitted sample  additional confirmatory testing may be necessary for epidemiological  and / or clinical management purposes  to differentiate between  SARS-CoV-2 and other Sarbecovirus currently known to infect humans.  If clinically indicated additional testing with an alternate test  methodology [id number]) is advised. The SARS-CoV-2 RNA is generally  detectable in upper and lower respiratory sp ecimens during the  acute  phase of infection. The expected result is Negative. Fact Sheet for Patients:  StrictlyIdeas.no Fact Sheet for Healthcare Providers: BankingDealers.co.za This test is not yet approved or cleared by the [place] FDA and has been authorized for detection and/or diagnosis of SARS-CoV-2 by FDA under an Emergency Use Authorization (EUA).  This EUA will remain in effect (meaning this test can be used) for the duration of the COVID-19 declaration under  Section 564(b)(1) of the Act, 21 U.S.C. section 360bbb-3(b)(1), unless the authorization is terminated or revoked sooner. Performed at [facility], [address] [place], [place] [address]     Blood Alcohol level:  Lab Results  Component Value Date   ETH 244 (H) 10/31/2018   ETH 163 (H) 60/45/4098    Metabolic Disorder Labs:  Lab Results  Component Value Date   HGBA1C 6.0 06/14/2016   MPG 126 01/03/2016   No results found for: PROLACTIN Lab Results  Component Value Date   CHOL 121 04/20/2016   TRIG 88 04/20/2016   HDL 38 (L) 04/20/2016   CHOLHDL 3.2 04/20/2016   VLDL 18 04/20/2016   LDLCALC 65 04/20/2016   LDLCALC 77 01/03/2016    Current Medications: Current Facility-Administered Medications  Medication Dose Route Frequency Provider Last Rate Last Dose  . acetaminophen (TYLENOL) tablet 650 mg  650 mg Oral Q6H PRN Money, [name], [name]   650 mg at 11/01/18 1022  . albuterol (VENTOLIN HFA) 108 (90 Base) MCG/ACT inhaler 2 puff  2 puff Inhalation Q6H PRN Clapacs, John T, MD      . alum & mag hydroxide-simeth (MAALOX/MYLANTA) 200-200-20 MG/5ML suspension 30 mL  30 mL Oral Q4H PRN Money, [name], [name]      . hydrOXYzine (ATARAX/VISTARIL) tablet 25 mg  25 mg Oral TID PRN Money, [name], [name]      . hydrOXYzine (ATARAX/VISTARIL) tablet 50 mg  50 mg Oral BID Clapacs, Madie Reno, MD   50 mg at 11/01/18 1642  . magnesium hydroxide (MILK OF MAGNESIA) suspension 30 mL  30 mL Oral Daily PRN Money, [name], [name]      . metoprolol succinate (TOPROL-XL) 24 hr tablet 25 mg  25 mg Oral Daily Clapacs, Madie Reno, MD   25 mg at 11/01/18 1642  . NIFEdipine (ADALAT CC) 24 hr tablet 60 mg  60 mg Oral Daily Clapacs, John T, MD      . pantoprazole (PROTONIX) EC tablet 40 mg  40 mg Oral BID Clapacs, Madie Reno, MD   40 mg at 11/01/18 1641  . promethazine (PHENERGAN) tablet 25 mg  25 mg Oral BID Clapacs, Madie Reno, MD   25 mg at 11/01/18 1642  . traZODone (DESYREL) tablet 50 mg  50 mg Oral QHS PRN  Money, [name], [name]       PTA Medications: Medications Prior to Admission  Medication Sig Dispense Refill Last Dose  . HYDROcodone-acetaminophen (NORCO/VICODIN) 5-325 MG tablet Take 1-2 tablets by mouth every 6 (six) hours as needed for moderate pain. 40 tablet 0   . hydrOXYzine (ATARAX/VISTARIL) 25 MG tablet Take 1 tablet (25 mg total) 3 (three) times daily as needed by mouth for itching. (Patient taking differently: Take 50 mg by mouth 2 (two) times a day. ) 30 tablet 0   . metoprolol succinate (TOPROL-XL) 25 MG 24 hr tablet Take 25 mg by mouth daily.     Marland Kitchen NIFEdipine (PROCARDIA XL/NIFEDICAL XL) 60 MG 24 hr tablet Take 60 mg by mouth daily.     Marland Kitchen  pantoprazole (PROTONIX) 40 MG tablet Take 40 mg by mouth 2 (two) times a day.     . promethazine (PHENERGAN) 25 MG tablet Take 1 tablet (25 mg total) by mouth every 6 (six) hours as needed for nausea or vomiting. (Patient taking differently: Take 25 mg by mouth 2 (two) times a day. ) 30 tablet 0   . VENTOLIN HFA 108 (90 Base) MCG/ACT inhaler INHALE 2 PUFFS BY MOUTH EVERY 4 TO 6 HOURS AS NEEDED. (Patient taking differently: Inhale 2 puffs into the lungs every 4 (four) hours as needed for wheezing or shortness of breath. ) 18 g 12   . vitamin B-12 (CYANOCOBALAMIN) 1000 MCG tablet Take 1,000 mcg by mouth daily.       Musculoskeletal: Strength & Muscle Tone: within normal limits Gait & Station: normal Patient leans: N/A  Psychiatric Specialty Exam: Physical Exam  Nursing note and vitals reviewed. Constitutional: He appears well-developed and well-nourished.  HENT:  Head: Normocephalic and atraumatic.  Eyes: Pupils are equal, round, and reactive to light. Conjunctivae are normal.  Neck: Normal range of motion.  Cardiovascular: Regular rhythm and normal heart sounds.  Respiratory: Effort normal.  GI: Soft.  Musculoskeletal: Normal range of motion.  Neurological: He is alert.  Skin: Skin is warm and dry.  Psychiatric: He has a normal mood and  affect. His behavior is normal. Judgment and thought content normal.    Review of Systems  Constitutional: Negative.   HENT: Negative.   Eyes: Negative.   Respiratory: Negative.   Cardiovascular: Negative.   Gastrointestinal: Negative.   Musculoskeletal: Negative.   Skin: Negative.   Neurological: Negative.   Psychiatric/Behavioral: Negative.     Blood pressure (!) 149/105, pulse 97, temperature 97.6 F (36.4 C), temperature source Oral, resp. rate 17, height 6\' 1"  (1.854 m), weight 130.2 kg, SpO2 99 %.Body mass index is 37.87 kg/m.  General Appearance: Casual  Eye Contact:  Fair  Speech:  Clear and Coherent  Volume:  Normal  Mood:  Euthymic  Affect:  Constricted  Thought Process:  Goal Directed  Orientation:  Full (Time, Place, and Person)  Thought Content:  Logical  Suicidal Thoughts:  No  Homicidal Thoughts:  No  Memory:  Immediate;   Fair Recent;   Fair Remote;   Fair  Judgement:  Fair  Insight:  Fair  Psychomotor Activity:  Normal  Concentration:  Concentration: Fair  Recall:  [employer] of Knowledge:  Fair  Language:  Fair  Akathisia:  No  Handed:  Right  AIMS (if indicated):     Assets:  Desire for Improvement  ADL's:  Intact  Cognition:  WNL  Sleep:  Number of Hours: 5    Treatment Plan Summary: Daily contact with patient to assess and evaluate symptoms and progress in treatment, Medication management and Plan We found a list of recent medications or prescriptions and we will try to get him back on his blood pressure medicines in another medical treatments as soon as possible.  Continue 15-minute checks.  No clear indication for any psychiatric medicine.  Patient currently appears to have stabilized.  Likely discharge within the next day.  Observation Level/Precautions:  15 minute checks  Laboratory:  Chemistry Profile  Psychotherapy:    Medications:    Consultations:    Discharge Concerns:    Estimated LOS:  Other:     Physician Treatment Plan for  Primary Diagnosis: Adjustment disorder with mixed disturbance of emotions and conduct Long Term Goal(s): Improvement in symptoms so  as ready for discharge  Short Term Goals: Ability to verbalize feelings will improve and Ability to disclose and discuss suicidal ideas  Physician Treatment Plan for Secondary Diagnosis: Principal Problem:   Adjustment disorder with mixed disturbance of emotions and conduct Active Problems:   HTN (hypertension)   Alcohol use disorder, severe, dependence ([place])  Long Term Goal(s): Improvement in symptoms so as ready for discharge  Short Term Goals: Ability to identify triggers associated with substance abuse/mental health issues will improve  I certify that inpatient services furnished can reasonably be expected to improve the patient's condition.    Alethia Berthold, MD 8/12/20204:50 PM

## 2018-11-01 NOTE — [facility] Suicide Risk Assessment (Signed)
[facility] INPATIENT:  Family/Significant Other Suicide Prevention Education  Suicide Prevention Education:  Education Completed; Janett Labella, mother [phone] has been identified by the patient as the family member/significant other with whom the patient will be residing, and identified as the person(s) who will aid the patient in the event of a mental health crisis (suicidal ideations/suicide attempt).  With written consent from the patient, the family member/significant other has been provided the following suicide prevention education, prior to the and/or following the discharge of the patient.  The suicide prevention education provided includes the following:  Suicide risk factors  Suicide prevention and interventions  National Suicide Hotline telephone number  [facility] assessment telephone number  [facility] [place] and/or Residential Mobile Crisis Unit telephone number  Request made of family/significant other to:  Remove weapons (e.g., guns, rifles, knives), all items previously/currently identified as safety concern.    Remove drugs/medications (over-the-counter, prescriptions, illicit drugs), all items previously/currently identified as a safety concern.  The family member/significant other verbalizes understanding of the suicide prevention education information provided.  The family member/significant other agrees to remove the items of safety concern listed above.  CSW spoke with pts mother [name] who reported that pt was doing really well until he found out the girl he was seeing was married and pt went berserk. Rose denied any concerns with SI/HI or with pt retuning home and reported there are no guns/weapons in the home.   Clark MSW LCSW 11/01/2018, 9:30 AM

## 2018-11-01 NOTE — [facility] Suicide Risk Assessment (Signed)
[facility] INPATIENT:  Family/Significant Other Suicide Prevention Education  Suicide Prevention Education:  Contact Attempts: Janett Labella, mother [phone] has been identified by the patient as the family member/significant other with whom the patient will be residing, and identified as the person(s) who will aid the patient in the event of a mental health crisis.  With written consent from the patient, two attempts were made to provide suicide prevention education, prior to and/or following the patient's discharge.  We were unsuccessful in providing suicide prevention education.  A suicide education pamphlet was given to the patient to share with family/significant other.  Date and time of first attempt: 11/01/2018 at 9:23AM Date and time of second attempt:  CSW left a HIPAA compliant voicemail requesting a call back.   Erick MSW LCSW 11/01/2018, 9:22 AM
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
## 2018-11-01 NOTE — BHH Suicide Risk Assessment (Signed)
[facility] Admission Suicide Risk Assessment   Nursing information obtained from:  Patient Demographic factors:  Caucasian Current Mental Status:  NA Loss Factors:  Loss of significant relationship Historical Factors:  NA Risk Reduction Factors:  NA  Total Time spent with patient: 1 hour Principal Problem: Adjustment disorder with mixed disturbance of emotions and conduct Diagnosis:  Principal Problem:   Adjustment disorder with mixed disturbance of emotions and conduct Active Problems:   HTN (hypertension)   Alcohol use disorder, severe, dependence (HCC)  Subjective Data: Patient seen and chart reviewed.  Patient with a long history of alcohol abuse presented voluntarily stating that he was having depression and having suicidal ideation.  He was intoxicated at the time.  He stated that he was depressed because he found out the woman he had been dating has a husband.  On interview with me today the patient denies any suicidal thought or plan.  Denies psychotic symptoms and claims that the time he came into the emergency room was his first alcohol in months.  Currently not complaining of any specific symptoms other than chronic joint pain  Continued Clinical Symptoms:  Alcohol Use Disorder Identification Test Final Score (AUDIT): 2 The "Alcohol Use Disorders Identification Test", Guidelines for Use in Primary Care, Second Edition.  World Pharmacologist [facility]). Score between 0-7:  no or low risk or alcohol related problems. Score between 8-15:  moderate risk of alcohol related problems. Score between 16-19:  high risk of alcohol related problems. Score 20 or above:  warrants further diagnostic evaluation for alcohol dependence and treatment.   CLINICAL FACTORS:   Alcohol/Substance Abuse/Dependencies Personality Disorders:   Cluster B   Musculoskeletal: Strength & Muscle Tone: within normal limits Gait & Station: normal Patient leans: N/A  Psychiatric Specialty Exam: Physical Exam   Nursing note and vitals reviewed. Constitutional: He appears well-developed and well-nourished.  HENT:  Head: Normocephalic and atraumatic.  Eyes: Pupils are equal, round, and reactive to light. Conjunctivae are normal.  Neck: Normal range of motion.  Cardiovascular: Regular rhythm and normal heart sounds.  Respiratory: Effort normal.  GI: Soft.  Musculoskeletal: Normal range of motion.  Neurological: He is alert.  Skin: Skin is warm and dry.  Psychiatric: He has a normal mood and affect. His speech is normal and behavior is normal. Judgment and thought content normal. Cognition and memory are normal.    Review of Systems  Constitutional: Negative.   HENT: Negative.   Eyes: Negative.   Respiratory: Negative.   Cardiovascular: Negative.   Gastrointestinal: Negative.   Musculoskeletal: Negative.   Skin: Negative.   Neurological: Negative.   Psychiatric/Behavioral: Negative.     Blood pressure (!) 149/105, pulse 97, temperature 97.6 F (36.4 C), temperature source Oral, resp. rate 17, height 6\' 1"  (1.854 m), weight 130.2 kg, SpO2 99 %.Body mass index is 37.87 kg/m.  General Appearance: Casual  Eye Contact:  Good  Speech:  Clear and Coherent  Volume:  Normal  Mood:  Euthymic  Affect:  Constricted  Thought Process:  Goal Directed  Orientation:  Full (Time, Place, and Person)  Thought Content:  Logical  Suicidal Thoughts:  No  Homicidal Thoughts:  No  Memory:  Immediate;   Fair Recent;   Fair Remote;   Fair  Judgement:  Fair  Insight:  Fair  Psychomotor Activity:  Normal  Concentration:  Concentration: Fair  Recall:  [employer] of Knowledge:  Fair  Language:  Fair  Akathisia:  No  Handed:  Right  AIMS (  if indicated):     Assets:  Desire for Improvement  ADL's:  Intact  Cognition:  WNL  Sleep:  Number of Hours: 5      COGNITIVE FEATURES THAT CONTRIBUTE TO RISK:  None    SUICIDE RISK:   Minimal: No identifiable suicidal ideation.  Patients presenting with no  risk factors but with morbid ruminations; may be classified as minimal risk based on the severity of the depressive symptoms  PLAN OF CARE: Patient now sober denies any suicidal thoughts.  15-minute checks employed.  Engage in individual and group activities.  Review chart and decide if medication is appropriate.  Meanwhile treat his medical conditions as is appropriate.  Likely discharge within the next day with referral to outpatient care  I certify that inpatient services furnished can reasonably be expected to improve the patient's condition.   Alethia Berthold, MD 11/01/2018, 4:45 PM

## 2018-11-02 NOTE — Progress Notes (Signed)
Recreation Therapy Notes   Date: 11/02/2018  Time: 9:30 am  Location: Craft Room  Behavioral response: Appropriate  Intervention Topic: Happiness  Discussion/Intervention:  Group content today was focused on Happiness. The group defined happiness and described where happiness comes from. Individuals identified what makes them happy and how they go about making others happy. Patients expressed things that stop them from being happy and ways they can improve their happiness. The group stated reasons why it is important to be happy. The group participated in the intervention "My Happiness", where they had a chance to identify and express things that make them happy. Clinical Observations/Feedback:  Patient came to group and stated happiness to him is loving his family and friends. He explained that you can buy love or happiness. Individual was social with peers and staff while participating in the intervention. Patient left group early to be discharged. Alan Eaton LRT/CTRS         Maryhelen Lindler 11/02/2018 11:42 AM

## 2018-11-02 NOTE — Progress Notes (Signed)
Patient ID: Alan Eaton, male   DOB: [birth year], 43 y.o.   MRN: [id number]  Discharge Note:  Patient denies SI/HI/AVH at this time. Discharge instructions, AVS, and transition record gone over with patient. Patient agrees to comply with medication management, follow-up visit, and outpatient therapy. Patient belongings returned to patient. Patient questions and concerns addressed and answered. Patient ambulatory off unit. Patient discharged to home with mother.

## 2018-11-02 NOTE — Progress Notes (Signed)
  [facility] Adult Case Management Discharge Plan :  Will you be returning to the same living situation after discharge:  Yes,  pt is returning to the home with mother. At discharge, do you have transportation home?: Yes,  mother will provide transportation. Do you have the ability to pay for your medications: Yes,  [facility] Medicare  Release of information consent forms completed and in the chart;  [name] needed at discharge.  Patient to Follow up at: [facility].   Why: Referral has been sent.  Please follow up with them in a few days if they have not contacted you.  Thank you! Contact information: [address] [place] [address] [phone]           Next level of care provider has access to [place] and Suicide Prevention discussed: Yes,  SPE completed with mother.   Have you used any form of tobacco in the last 30 days? (Cigarettes, Smokeless Tobacco, Cigars, and/or Pipes): Yes  Has patient been referred to the Quitline?: Patient refused referral  Patient has been referred for addiction treatment: Yes  Rozann Lesches, LCSW 11/02/2018, 9:18 AM

## 2018-11-02 NOTE — BHH Suicide Risk Assessment (Signed)
[facility] Discharge Suicide Risk Assessment   Principal Problem: Adjustment disorder with mixed disturbance of emotions and conduct Discharge Diagnoses: Principal Problem:   Adjustment disorder with mixed disturbance of emotions and conduct Active Problems:   HTN (hypertension)   Alcohol use disorder, severe, dependence ([place])   Total Time spent with patient: 45 minutes  Musculoskeletal: Strength & Muscle Tone: within normal limits Gait & Station: normal Patient leans: N/A  Psychiatric Specialty Exam: Review of Systems  Constitutional: Negative.   HENT: Negative.   Eyes: Negative.   Respiratory: Negative.   Cardiovascular: Negative.   Gastrointestinal: Negative.   Musculoskeletal: Negative.   Skin: Negative.   Neurological: Negative.   Psychiatric/Behavioral: Negative.     Blood pressure (!) 145/89, pulse 91, temperature 97.7 F (36.5 C), temperature source Oral, resp. rate 17, height 6\' 1"  (1.854 m), weight 130.2 kg, SpO2 99 %.Body mass index is 37.87 kg/m.  General Appearance: Casual  Eye Contact::  Good  Speech:  Clear and Coherent409  Volume:  Normal  Mood:  Euthymic  Affect:  Congruent  Thought Process:  Coherent  Orientation:  Full (Time, Place, and Person)  Thought Content:  Logical  Suicidal Thoughts:  No  Homicidal Thoughts:  No  Memory:  Immediate;   Fair Recent;   Fair Remote;   Fair  Judgement:  Fair  Insight:  Fair  Psychomotor Activity:  Normal  Concentration:  Fair  Recall:  [employer] of [place]  Language: Fair  Akathisia:  No  Handed:  Right  AIMS (if indicated):     Assets:  Desire for Improvement Housing Physical Health  Sleep:  Number of Hours: 6.25  Cognition: WNL  ADL's:  Intact   Mental Status Per Nursing Assessment::   On Admission:  NA  Demographic Factors:  Male and Caucasian  Loss Factors: Loss of significant relationship  Historical Factors: Prior suicide attempts and Impulsivity  Risk Reduction Factors:   Sense  of responsibility to family, Religious beliefs about death, Living with another person, especially a relative, Positive social support and Positive therapeutic relationship  Continued Clinical Symptoms:  Depression:   Impulsivity Alcohol/Substance Abuse/Dependencies  Cognitive Features That Contribute To Risk:  None    Suicide Risk:  Minimal: No identifiable suicidal ideation.  Patients presenting with no risk factors but with morbid ruminations; may be classified as minimal risk based on the severity of the depressive symptoms  [facility] Follow up.   Why: Referral has been sent.  Please follow up with them in a few days if they have not contacted you.  Thank you! Contact information: [place]  [address] [phone]           Plan Of Care/Follow-up recommendations:  Activity:  Activity as tolerated Diet:  Regular or heart healthy of possible diet Other:  Strongly encouraged him to follow-up with outpatient mental health treatment including involvement and ongoing substance abuse treatment.  He has been given information regarding the inpatient treatment program in [place] with whom he can follow-up.  Encourage patient to continue his medication for alcohol abuse and to consider possibly getting back into robust psychosocial treatment.  Alethia Berthold, MD 11/02/2018, 9:32 AM

## 2018-11-02 NOTE — Discharge Summary (Signed)
Physician Discharge Summary Note  Patient:  Alan Eaton is an 43 y.o., male MRN:  [id number] DOB:  30-Jul-1975 Patient phone:  [phone] (home)  Patient address:   [address] [place] [address],  Total Time spent with patient: 45 minutes  Date of Admission:  10/31/2018 Date of Discharge: November 02, 2018  Reason for Admission: Presented to the emergency room intoxicated with statements of sad mood and suicidal ideation  Principal Problem: Adjustment disorder with mixed disturbance of emotions and conduct Discharge Diagnoses: Principal Problem:   Adjustment disorder with mixed disturbance of emotions and conduct Active Problems:   HTN (hypertension)   Alcohol use disorder, severe, dependence ([place])   Past Psychiatric History: Patient has a history of alcohol abuse and had had previous admissions and presentations usually with the same pattern of intoxication accompanied by sad mood and suicidal ideation.  More recently he reports he had been staying sober and his mood had been good.  Past Medical History:  Past Medical History:  Diagnosis Date  . Alcohol abuse   . Bronchitis   . Depression   . Diabetes mellitus without complication ([place])    lost weight diet mgmt at present  . Drug abuse ([place])   . Family history of adverse reaction to anesthesia   . GERD (gastroesophageal reflux disease)   . Gout   . Hypertension   . Morbid obesity ([place])   . Suicide ideation     Past Surgical History:  Procedure Laterality Date  . none    . TRICEPS TENDON REPAIR Right 09/26/2018   Procedure: Debridement and primary repair of partial-thickness right distal triceps tendon tear. ;  Surgeon: Corky Mull, MD;  Location: [facility] ORS;  Service: Orthopedics;  Laterality: Right;  . UPPER GASTROINTESTINAL ENDOSCOPY     Family History:  Family History  Problem Relation Age of Onset  . Hypertension Other   . Diabetes Mellitus II Other    Family Psychiatric  History: Family history of  alcohol Social History:  Social History   Substance and Sexual Activity  Alcohol Use Yes  . Alcohol/week: 0.0 standard drinks   Comment: 14 beers today     Social History   Substance and Sexual Activity  Drug Use Yes  . Frequency: 1.0 times per week  . Types: Marijuana, Cocaine   Comment: last used cocaine Monday; last smoked marijuana monday    Social History   Socioeconomic History  . Marital status: Divorced    Spouse name: Not on file  . Number of children: Not on file  . Years of education: Not on file  . Highest education level: Not on file  Occupational History  . Not on file  Social Needs  . Financial resource strain: Not on file  . Food insecurity    Worry: Not on file    Inability: Not on file  . Transportation needs    Medical: Not on file    Non-medical: Not on file  Tobacco Use  . Smoking status: Current Every Day Smoker    Packs/day: 1.00    Types: Cigarettes  . Smokeless tobacco: Current User    Types: Snuff  Substance and Sexual Activity  . Alcohol use: Yes    Alcohol/week: 0.0 standard drinks    Comment: 14 beers today  . Drug use: Yes    Frequency: 1.0 times per week    Types: Marijuana, Cocaine    Comment: last used cocaine Monday; last smoked marijuana monday  . Sexual activity:  Not on file  Lifestyle  . Physical activity    Days per week: Not on file    Minutes per session: Not on file  . Stress: Not on file  Relationships  . Social Herbalist on phone: Not on file    Gets together: Not on file    Attends religious service: Not on file    Active member of club or organization: Not on file    Attends meetings of clubs or organizations: Not on file    Relationship status: Not on file  Other Topics Concern  . Not on file  Social History Narrative  . Not on file    Hospital Course: Patient admitted to the psychiatric ward.  No behavior problems.  15-minute checks maintained.  Patient sobered up and completely denied any  suicidal ideation.  He was forthcoming about his recent troubles and about the fact that he had been drinking.  He maintains that he had been staying sober for months prior to that and he attributes this to continuing to receive a monthly injection of Vivitrol.  Patient did not show any signs of alcohol withdrawal problems.  He was cooperative and pleasant.  Completely denied suicidal ideation.  Did not give a history of recent depression and there did not appear to be an indication for adding any medicine.  Psychoeducation and counseling was provided.  Patient is planning to go back home and continue on his current treatment for alcohol abuse, to continue to avoid alcohol and has been strongly encouraged to get back involved in psychosocial treatment whether it be 12-step programs or going back to [place].  Physical Findings: AIMS:  , ,  ,  ,    CIWA:    COWS:     Musculoskeletal: Strength & Muscle Tone: within normal limits Gait & Station: normal Patient leans: N/A  Psychiatric Specialty Exam: Physical Exam  Nursing note and vitals reviewed. Constitutional: He appears well-developed and well-nourished.  HENT:  Head: Normocephalic and atraumatic.  Eyes: Pupils are equal, round, and reactive to light. Conjunctivae are normal.  Neck: Normal range of motion.  Cardiovascular: Regular rhythm and normal heart sounds.  Respiratory: Effort normal. No respiratory distress.  GI: Soft.  Musculoskeletal: Normal range of motion.  Neurological: He is alert.  Skin: Skin is warm and dry.  Psychiatric: He has a normal mood and affect. His behavior is normal. Judgment and thought content normal.    Review of Systems  Constitutional: Negative.   HENT: Negative.   Eyes: Negative.   Respiratory: Negative.   Cardiovascular: Negative.   Gastrointestinal: Negative.   Musculoskeletal: Negative.   Skin: Negative.   Neurological: Negative.   Psychiatric/Behavioral: Negative.     Blood pressure (!) 145/89,  pulse 91, temperature 97.7 F (36.5 C), temperature source Oral, resp. rate 17, height 6\' 1"  (1.854 m), weight 130.2 kg, SpO2 99 %.Body mass index is 37.87 kg/m.  General Appearance: Casual  Eye Contact:  Good  Speech:  Clear and Coherent  Volume:  Normal  Mood:  Euthymic  Affect:  Congruent  Thought Process:  Goal Directed  Orientation:  Full (Time, Place, and Person)  Thought Content:  Logical  Suicidal Thoughts:  No  Homicidal Thoughts:  No  Memory:  Immediate;   Fair Recent;   Fair Remote;   Fair  Judgement:  Fair  Insight:  Fair  Psychomotor Activity:  Normal  Concentration:  Concentration: Fair  Recall:  [employer] of Knowledge:  Fair  Language:  Fair  Akathisia:  No  Handed:  Right  AIMS (if indicated):     Assets:  Desire for Improvement  ADL's:  Intact  Cognition:  WNL  Sleep:  Number of Hours: 6.25     Have you used any form of tobacco in the last 30 days? (Cigarettes, Smokeless Tobacco, Cigars, and/or Pipes): Yes  Has this patient used any form of tobacco in the last 30 days? (Cigarettes, Smokeless Tobacco, Cigars, and/or Pipes) Yes, Yes, A prescription for an FDA-approved tobacco cessation medication was offered at discharge and the patient refused  Blood Alcohol level:  Lab Results  Component Value Date   ETH 244 (H) 10/31/2018   ETH 163 (H) 28/41/3244    Metabolic Disorder Labs:  Lab Results  Component Value Date   HGBA1C 6.0 06/14/2016   MPG 126 01/03/2016   No results found for: PROLACTIN Lab Results  Component Value Date   CHOL 121 04/20/2016   TRIG 88 04/20/2016   HDL 38 (L) 04/20/2016   CHOLHDL 3.2 04/20/2016   VLDL 18 04/20/2016   LDLCALC 65 04/20/2016   [place] 77 01/03/2016    See Psychiatric Specialty Exam and Suicide Risk Assessment completed by Attending Physician prior to discharge.  Discharge destination:  Home  Is patient on multiple antipsychotic therapies at discharge:  No   Has Patient had three or more failed trials of  antipsychotic monotherapy by history:  No  Recommended Plan for Multiple Antipsychotic Therapies: NA  Discharge Instructions    Diet - low sodium heart healthy   Complete by: As directed    Increase activity slowly   Complete by: As directed      Allergies as of 11/02/2018      Reactions   Bee Venom Swelling   Bactrim [sulfamethoxazole-trimethoprim] Swelling, Rash      Medication List    STOP taking these medications   HYDROcodone-acetaminophen 5-325 MG tablet Commonly known as: NORCO/VICODIN     TAKE these medications     Indication  hydrOXYzine 25 MG tablet Commonly known as: ATARAX/VISTARIL Take 1 tablet (25 mg total) 3 (three) times daily as needed by mouth for itching. What changed:   how much to take  when to take this  Indication: Feeling Anxious   metoprolol succinate 25 MG 24 hr tablet Commonly known as: TOPROL-XL Take 25 mg by mouth daily.  Indication: High Blood Pressure Disorder   NIFEdipine 60 MG 24 hr tablet Commonly known as: PROCARDIA XL/NIFEDICAL XL Take 60 mg by mouth daily.  Indication: High Blood Pressure Disorder   pantoprazole 40 MG tablet Commonly known as: PROTONIX Take 40 mg by mouth 2 (two) times a day.  Indication: Gastroesophageal Reflux Disease   promethazine 25 MG tablet Commonly known as: PHENERGAN Take 1 tablet (25 mg total) by mouth every 6 (six) hours as needed for nausea or vomiting. What changed: when to take this  Indication: Nausea and Vomiting   Ventolin HFA 108 (90 Base) MCG/ACT inhaler Generic drug: albuterol INHALE 2 PUFFS BY MOUTH EVERY 4 TO 6 HOURS AS NEEDED. What changed: See the new instructions.  Indication: Chronic Obstructive Lung Disease   vitamin B-12 1000 MCG tablet Commonly known as: CYANOCOBALAMIN Take 1,000 mcg by mouth daily.  Indication: Inadequate Vitamin B12      Follow-up [facility].   Why: Referral has been sent.   Please follow up with them in a  few days if they have not contacted you.  Thank you! Contact information: [address] [place] [address] [phone]           Follow-up recommendations:  Activity:  Activity as tolerated Tests:  No testing Other:  Continue follow-up with outpatient treatment.  Regular diet.  No change to medicine  Comments: No new prescriptions given at discharge as no changes were made.  Signed: Alethia Berthold, MD 11/02/2018, 9:39 AM

## 2018-11-02 NOTE — Plan of Care (Signed)
  Problem: Education: Goal: Ability to make informed decisions regarding treatment will improve Outcome: Adequate for Discharge  D: Pleasant and cooperative. Denies SI, HI and AVH. A: continue to monitor for safety R: safety maintained.

## 2018-11-02 NOTE — Progress Notes (Signed)
D: Pleasant and cooperative. Denies SI, HI and AVH. A: continue to monitor for safety R: safety maintained.

## 2018-11-15 ENCOUNTER — Other Ambulatory Visit: Payer: Self-pay

## 2018-11-15 ENCOUNTER — Inpatient Hospital Stay
Admission: RE | Admit: 2018-11-15 | Discharge: 2018-11-22 | DRG: 882 | Disposition: A | Discharge status: Home / Self Care | Payer: Medicare Other | Source: Intra-hospital | Attending: Psychiatry | Admitting: Psychiatry

## 2018-11-15 ENCOUNTER — Emergency Department
Admission: EM | Admit: 2018-11-15 | Discharge: 2018-11-15 | Disposition: A | Discharge status: Other Acute Inpatient Hospital | Payer: Medicare Other | Source: Home / Self Care | Attending: Emergency Medicine | Admitting: Emergency Medicine

## 2018-11-15 ENCOUNTER — Encounter: Payer: Self-pay | Admitting: Behavioral Health

## 2018-11-15 DIAGNOSIS — E119 Type 2 diabetes mellitus without complications: Secondary | ICD-10-CM | POA: Insufficient documentation

## 2018-11-15 DIAGNOSIS — F32A Depression, unspecified: Secondary | ICD-10-CM | POA: Diagnosis present

## 2018-11-15 DIAGNOSIS — F333 Major depressive disorder, recurrent, severe with psychotic symptoms: Secondary | ICD-10-CM | POA: Insufficient documentation

## 2018-11-15 DIAGNOSIS — F10929 Alcohol use, unspecified with intoxication, unspecified: Secondary | ICD-10-CM

## 2018-11-15 DIAGNOSIS — R45851 Suicidal ideations: Secondary | ICD-10-CM | POA: Diagnosis present

## 2018-11-15 DIAGNOSIS — F1020 Alcohol dependence, uncomplicated: Secondary | ICD-10-CM | POA: Diagnosis present

## 2018-11-15 DIAGNOSIS — Y906 Blood alcohol level of 120-199 mg/100 ml: Secondary | ICD-10-CM | POA: Insufficient documentation

## 2018-11-15 DIAGNOSIS — F4489 Other dissociative and conversion disorders: Secondary | ICD-10-CM | POA: Diagnosis present

## 2018-11-15 DIAGNOSIS — F17220 Nicotine dependence, chewing tobacco, uncomplicated: Secondary | ICD-10-CM | POA: Insufficient documentation

## 2018-11-15 DIAGNOSIS — X58XXXA Exposure to other specified factors, initial encounter: Secondary | ICD-10-CM | POA: Insufficient documentation

## 2018-11-15 DIAGNOSIS — Z20828 Contact with and (suspected) exposure to other viral communicable diseases: Secondary | ICD-10-CM | POA: Diagnosis present

## 2018-11-15 DIAGNOSIS — F329 Major depressive disorder, single episode, unspecified: Secondary | ICD-10-CM | POA: Diagnosis present

## 2018-11-15 DIAGNOSIS — T1491XA Suicide attempt, initial encounter: Secondary | ICD-10-CM | POA: Insufficient documentation

## 2018-11-15 DIAGNOSIS — Y999 Unspecified external cause status: Secondary | ICD-10-CM | POA: Insufficient documentation

## 2018-11-15 DIAGNOSIS — Z79899 Other long term (current) drug therapy: Secondary | ICD-10-CM | POA: Insufficient documentation

## 2018-11-15 DIAGNOSIS — J449 Chronic obstructive pulmonary disease, unspecified: Secondary | ICD-10-CM | POA: Diagnosis present

## 2018-11-15 DIAGNOSIS — K219 Gastro-esophageal reflux disease without esophagitis: Secondary | ICD-10-CM | POA: Diagnosis present

## 2018-11-15 DIAGNOSIS — F4325 Adjustment disorder with mixed disturbance of emotions and conduct: Secondary | ICD-10-CM | POA: Diagnosis present

## 2018-11-15 DIAGNOSIS — F17210 Nicotine dependence, cigarettes, uncomplicated: Secondary | ICD-10-CM | POA: Insufficient documentation

## 2018-11-15 DIAGNOSIS — M109 Gout, unspecified: Secondary | ICD-10-CM | POA: Diagnosis present

## 2018-11-15 DIAGNOSIS — I10 Essential (primary) hypertension: Secondary | ICD-10-CM | POA: Diagnosis present

## 2018-11-15 DIAGNOSIS — R0789 Other chest pain: Secondary | ICD-10-CM | POA: Diagnosis present

## 2018-11-15 DIAGNOSIS — Y939 Activity, unspecified: Secondary | ICD-10-CM | POA: Insufficient documentation

## 2018-11-15 DIAGNOSIS — F322 Major depressive disorder, single episode, severe without psychotic features: Secondary | ICD-10-CM | POA: Diagnosis present

## 2018-11-15 DIAGNOSIS — E669 Obesity, unspecified: Secondary | ICD-10-CM | POA: Diagnosis present

## 2018-11-15 DIAGNOSIS — Y929 Unspecified place or not applicable: Secondary | ICD-10-CM | POA: Insufficient documentation

## 2018-11-15 LAB — COMPREHENSIVE METABOLIC PANEL
ALT: 23 U/L (ref 0–44)
AST: 22 U/L (ref 15–41)
Albumin: 4.3 g/dL (ref 3.5–5.0)
Alkaline Phosphatase: 58 U/L (ref 38–126)
Anion gap: 10 (ref 5–15)
BUN: 12 mg/dL (ref 6–20)
CO2: 22 mmol/L (ref 22–32)
Calcium: 9 mg/dL (ref 8.9–10.3)
Chloride: 107 mmol/L (ref 98–111)
Creatinine, Ser: 1.26 mg/dL — ABNORMAL HIGH (ref 0.61–1.24)
GFR calc Af Amer: >60 mL/min (ref >60)
GFR calc non Af Amer: >60 mL/min (ref >60)
Glucose, Bld: 98 mg/dL (ref 70–99)
Potassium: 3.5 mmol/L (ref 3.5–5.1)
Sodium: 139 mmol/L (ref 135–145)
Total Bilirubin: 0.3 mg/dL (ref 0.3–1.2)
Total Protein: 7.7 g/dL (ref 6.5–8.1)

## 2018-11-15 LAB — CBC
HCT: 41.7 % (ref 39.0–52.0)
Hemoglobin: 14 g/dL (ref 13.0–17.0)
MCH: 29.8 pg (ref 26.0–34.0)
MCHC: 33.6 g/dL (ref 30.0–36.0)
MCV: 88.7 fL (ref 80.0–100.0)
Platelets: 250 10*3/uL (ref 150–400)
RBC: 4.7 MIL/uL (ref 4.22–5.81)
RDW: 13.5 % (ref 11.5–15.5)
WBC: 12.4 10*3/uL — ABNORMAL HIGH (ref 4.0–10.5)
nRBC: 0 % (ref 0.0–0.2)

## 2018-11-15 LAB — SALICYLATE LEVEL: Salicylate Lvl: <7 mg/dL (ref 2.8–30.0)

## 2018-11-15 LAB — URINE DRUG SCREEN, QUALITATIVE (ARMC ONLY)
Amphetamines, Ur Screen: NOT DETECTED
Barbiturates, Ur Screen: NOT DETECTED
Benzodiazepine, Ur Scrn: NOT DETECTED
Cannabinoid 50 Ng, Ur ~~LOC~~: NOT DETECTED
Cocaine Metabolite,Ur ~~LOC~~: NOT DETECTED
MDMA (Ecstasy)Ur Screen: NOT DETECTED
Methadone Scn, Ur: NOT DETECTED
Opiate, Ur Screen: NOT DETECTED
Phencyclidine (PCP) Ur S: NOT DETECTED
Tricyclic, Ur Screen: NOT DETECTED

## 2018-11-15 LAB — URINALYSIS, COMPLETE (UACMP) WITH MICROSCOPIC
Bacteria, UA: NONE SEEN
Bilirubin Urine: NEGATIVE
Glucose, UA: NEGATIVE mg/dL
Hgb urine dipstick: NEGATIVE
Ketones, ur: NEGATIVE mg/dL
Leukocytes,Ua: NEGATIVE
Nitrite: NEGATIVE
Protein, ur: NEGATIVE mg/dL
Specific Gravity, Urine: 1.001 — ABNORMAL LOW (ref 1.005–1.030)
Squamous Epithelial / HPF: NONE SEEN (ref 0–5)
pH: 6 (ref 5.0–8.0)

## 2018-11-15 LAB — ACETAMINOPHEN LEVEL: Acetaminophen (Tylenol), Serum: <10 ug/mL — ABNORMAL LOW (ref 10–30)

## 2018-11-15 LAB — ETHANOL: Alcohol, Ethyl (B): 139 mg/dL — ABNORMAL HIGH (ref <10)

## 2018-11-15 LAB — SARS CORONAVIRUS 2 BY RT PCR (HOSPITAL ORDER, PERFORMED IN ~~LOC~~ HOSPITAL LAB): SARS Coronavirus 2: NEGATIVE

## 2018-11-15 MED ORDER — TRAZODONE HCL 50 MG PO TABS
50.0000 mg | ORAL_TABLET | Freq: Every evening | ORAL | Status: DC | PRN
  Administered 2018-11-18: 50 mg via ORAL
  Filled 2018-11-15: qty 1

## 2018-11-15 MED ORDER — ACETAMINOPHEN 325 MG PO TABS
650.0000 mg | ORAL_TABLET | Freq: Four times a day (QID) | ORAL | Status: DC | PRN
  Administered 2018-11-18: 650 mg via ORAL
  Filled 2018-11-15: qty 2

## 2018-11-15 MED ORDER — HYDROXYZINE HCL 25 MG PO TABS
25.0000 mg | ORAL_TABLET | Freq: Three times a day (TID) | ORAL | Status: DC | PRN
  Administered 2018-11-16 (×2): 25 mg via ORAL
  Filled 2018-11-15 (×2): qty 1

## 2018-11-15 MED ORDER — MAGNESIUM HYDROXIDE 400 MG/5ML PO SUSP: 30.0000 mL | Freq: Every day | ORAL | Status: DC | PRN

## 2018-11-15 MED ORDER — ALUM & MAG HYDROXIDE-SIMETH 200-200-20 MG/5ML PO SUSP: 30.0000 mL | ORAL | Status: DC | PRN

## 2018-11-15 MED ORDER — METOPROLOL SUCCINATE ER 25 MG PO TB24
25.0000 mg | ORAL_TABLET | Freq: Every day | ORAL | Status: DC
  Administered 2018-11-15 – 2018-11-18 (×4): 25 mg via ORAL
  Filled 2018-11-15 (×4): qty 1

## 2018-11-15 MED ORDER — ACETAMINOPHEN 325 MG PO TABS: 650.0000 mg | ORAL_TABLET | Freq: Once | ORAL | Status: DC

## 2018-11-15 NOTE — Tx Team (Signed)
Initial Treatment Plan 11/15/2018 8:33 PM Bartolomeo Sachar [id number]    PATIENT STRESSORS: Financial difficulties Marital or family conflict Occupational concerns   PATIENT STRENGTHS: Ability for insight Active sense of humor Capable of independent living Motivation for treatment/growth   PATIENT IDENTIFIED PROBLEMS: Suicidal ideation    Depression/Anxianty    Body Image             DISCHARGE CRITERIA:  Ability to meet basic life and health needs Improved stabilization in mood, thinking, and/or behavior Medical problems require only outpatient monitoring Need for constant or close observation no longer present  PRELIMINARY DISCHARGE PLAN: Attend aftercare/continuing care group Attend 12-step recovery group Participate in family therapy Return to previous living arrangement  PATIENT/FAMILY INVOLVEMENT: This treatment plan has been presented to and reviewed with the patient, Najeeb Balliett,   The patient  have been given the opportunity to ask questions and make suggestions.  Clemens Catholic, RN 11/15/2018, 8:33 PM

## 2018-11-15 NOTE — ED Notes (Signed)
Hourly rounding reveals patient in room. No complaints, stable, in no acute distress. Q15 minute rounds and monitoring via Rover and Officer to continue.   

## 2018-11-15 NOTE — Consult Note (Signed)
[facility] Consult   Reason for Consult: Suicidal ideation Referring Physician: Dr. Alm Bustard Patient Identification: Alan Eaton MRN:  [id number] Principal Diagnosis: Depression Diagnosis:  Principal Problem:   Unspecified depressive disorder Active Problems:   Obesity   Gout   Pseudologia Fantastica   Alcohol use disorder, severe, dependence ([place])   MDD (major depressive disorder), severe ([place])   Adjustment disorder with mixed disturbance of emotions and conduct   Total Time spent with patient: 45 minutes  Subjective:   Alan Eaton is a 43 y.o. male patient Presented to [facility] ED via law enforcement under involuntary commitment status (IVC). The patient disclosed that he is currently suicidal with a plan. He states, "it is because of Crissy."  The patient revealed that he took 8 Vicodin tonight but denies drinking any alcohol.  His alcohol level is 129 mg/ DL.  He stated the last time he drank was three years ago. The patient also disclosed that he gets a monthly Vivitrol injection, which was administered on August 3 of 2020. He voiced that he does not want to be here anymore. He is upset about a relationship which she mislead him and he believed thing that was not the truth about her. "She told me she was married, and she only dated me because I had money." He voiced that he does not want to be admitted here at [facility]. He requested to be switched to another hospital because he does not like this hospital.    The patient was seen face-to-face by this provider; chart reviewed and consulted with Dr. Joni Fears on 11/15/2018 due to the care of the patient. It was discussed with the EDP that the patient does meet the criteria to be admitted to the inpatient unit.  Due to him voicing that he is currently suicidal with a plan to end his life. On evaluation, the patient is alert and oriented x4; he was upset at his girlfriend but cooperative and mood-congruent with affect. The  patient does not appear to be responding to internal or external stimuli. Neither is the patient presenting with any delusional thinking. The patient denies auditory or visual hallucinations. The patient admits to suicidal and self-harm ideations but denies homicidal ideations. The patient is not presenting with any psychotic or paranoid behaviors. During an encounter with the patient, he was able to answer questions appropriately.  Plan: The patient is a safety risk to self and does require psychiatric inpatient admission for stabilization and treatment.  HPI: Per Dr. Alm Bustard; Alan Eaton is a 43 y.o. male with a history of depression diabetes drug abuse GERD hypertension obesity who comes to the ED complaining of suicide attempt by taking 8 Vicodin at home.  He states this is because his girlfriend is also having intercourse with another person which makes him very upset.  Also his mom and his stepdad have both been sick recently which makes him very upset.  He has had to help take care of them when he feels overwhelmed and hopeless.  He still endorses suicidal ideation.  No HI or hallucinations.  Symptoms are constant without alleviating factors.  Denies any current pain or recent illnesses.   Past Psychiatric History:  Alcohol abuse Depression Drug abuse Suicidal ideation  Risk to Self: Suicidal Ideation: Yes-Currently Present Suicidal Intent: Yes-Currently Present Is patient at risk for suicide?: Yes Suicidal Plan?: Yes-Currently Present Specify Current Suicidal Plan: Overdose on Vicodin Access to Means: Yes Specify Access to Suicidal Means: Access to medications What has been your  use of drugs/alcohol within the last 12 months?: History of Drug use How many times?: 4 Other Self Harm Risks: cutting Triggers for Past Attempts: Unknown Intentional Self Injurious Behavior: Cutting Comment - Self Injurious Behavior: reports he cuts himself Risk to Others: Homicidal Ideation:  No Thoughts of Harm to Others: No Current Homicidal Intent: No Current Homicidal Plan: No Access to Homicidal Means: No Identified Victim: None identified History of harm to others?: No Assessment of Violence: None Noted Does patient have access to weapons?: No Criminal Charges Pending?: No Does patient have a court date: No Prior Inpatient Therapy: Prior Inpatient Therapy: Yes Prior Therapy Dates: August 2020 and prior Prior Therapy Facilty/Provider(s): [facility], [place], [place], [place], [place] Reason for Treatment: Depression, Alcohol Prior Outpatient Therapy: Prior Outpatient Therapy: Yes Prior Therapy Dates: Current Prior Therapy Facilty/Provider(s): Dr. Gilberto Better - ([place]) Reason for Treatment: Depression Does patient have an ACCT team?: No Does patient have Intensive In-House Services?  : No Does patient have [facility] services? : No Does patient have P4CC services?: No  Past Medical History:  Past Medical History:  Diagnosis Date  . Alcohol abuse   . Bronchitis   . Depression   . Diabetes mellitus without complication ([place])    lost weight diet mgmt at present  . Drug abuse ([place])   . Family history of adverse reaction to anesthesia   . GERD (gastroesophageal reflux disease)   . Gout   . Hypertension   . Morbid obesity ([place])   . Suicide ideation     Past Surgical History:  Procedure Laterality Date  . none    . TRICEPS TENDON REPAIR Right 09/26/2018   Procedure: Debridement and primary repair of partial-thickness right distal triceps tendon tear. ;  Surgeon: Corky Mull, MD;  Location: [facility] ORS;  Service: Orthopedics;  Laterality: Right;  . UPPER GASTROINTESTINAL ENDOSCOPY     Family History:  Family History  Problem Relation Age of Onset  . Hypertension Other   . Diabetes Mellitus II Other    Family Psychiatric  History: History reviewed. No pertinent family psychiatric history Social History:  Social History   Substance and Sexual Activity   Alcohol Use Yes  . Alcohol/week: 0.0 standard drinks   Comment: 14 beers today     Social History   Substance and Sexual Activity  Drug Use Yes  . Frequency: 1.0 times per week  . Types: Marijuana, Cocaine   Comment: last used cocaine Monday; last smoked marijuana monday    Social History   Socioeconomic History  . Marital status: Divorced    Spouse name: Not on file  . Number of children: Not on file  . Years of education: Not on file  . Highest education level: Not on file  Occupational History  . Not on file  Social Needs  . Financial resource strain: Not on file  . Food insecurity    Worry: Not on file    Inability: Not on file  . Transportation needs    Medical: Not on file    Non-medical: Not on file  Tobacco Use  . Smoking status: Current Every Day Smoker    Packs/day: 1.00    Types: Cigarettes  . Smokeless tobacco: Current User    Types: Snuff  Substance and Sexual Activity  . Alcohol use: Yes    Alcohol/week: 0.0 standard drinks    Comment: 14 beers today  . Drug use: Yes    Frequency: 1.0 times per week  Types: Marijuana, Cocaine    Comment: last used cocaine Monday; last smoked marijuana monday  . Sexual activity: Not on file  Lifestyle  . Physical activity    Days per week: Not on file    Minutes per session: Not on file  . Stress: Not on file  Relationships  . Social Herbalist on phone: Not on file    Gets together: Not on file    Attends religious service: Not on file    Active member of club or organization: Not on file    Attends meetings of clubs or organizations: Not on file    Relationship status: Not on file  Other Topics Concern  . Not on file  Social History Narrative  . Not on file   Additional Social History:    Allergies:   Allergies  Allergen Reactions  . Bee Venom Swelling  . Bactrim [Sulfamethoxazole-Trimethoprim] Swelling and Rash    Labs:  Results for orders placed or performed during the hospital  encounter of 11/15/18 (from the past 48 hour(s))  CBC     Status: Abnormal   Collection Time: 11/15/18  1:58 AM  Result Value Ref Range   WBC 12.4 (H) 4.0 - 10.5 K/uL   RBC 4.70 4.22 - 5.81 MIL/uL   Hemoglobin 14.0 13.0 - 17.0 g/dL   HCT 41.7 39.0 - 52.0 %   MCV 88.7 80.0 - 100.0 fL   MCH 29.8 26.0 - 34.0 pg   MCHC 33.6 30.0 - 36.0 g/dL   RDW 13.5 11.5 - 15.5 %   Platelets 250 150 - 400 K/uL   nRBC 0.0 0.0 - 0.2 %    Comment: Performed at [facility], [address] [place], [place] [address]  Comprehensive metabolic panel     Status: Abnormal   Collection Time: 11/15/18  1:58 AM  Result Value Ref Range   Sodium 139 135 - 145 mmol/L   Potassium 3.5 3.5 - 5.1 mmol/L   Chloride 107 98 - 111 mmol/L   CO2 22 22 - 32 mmol/L   Glucose, Bld 98 70 - 99 mg/dL   BUN 12 6 - 20 mg/dL   Creatinine, Ser 1.26 (H) 0.61 - 1.24 mg/dL   Calcium 9.0 8.9 - 10.3 mg/dL   Total Protein 7.7 6.5 - 8.1 g/dL   Albumin 4.3 3.5 - 5.0 g/dL   AST 22 15 - 41 U/L   ALT 23 0 - 44 U/L   Alkaline Phosphatase 58 38 - 126 U/L   Total Bilirubin 0.3 0.3 - 1.2 mg/dL   GFR calc non Af Amer >60 >60 mL/min   GFR calc Af Amer >60 >60 mL/min   Anion gap 10 5 - 15    Comment: Performed at [facility], [address] [place], [place] [id number]  Salicylate level     Status: None   Collection Time: 11/15/18  1:58 AM  Result Value Ref Range   Salicylate Lvl [id number] 2.8 - 30.0 mg/dL    Comment: Performed at [facility], [address] [place], [place] [address]  Urinalysis, Complete w Microscopic     Status: Abnormal   Collection Time: 11/15/18  1:58 AM  Result Value Ref Range   Color, Urine COLORLESS (A) YELLOW   APPearance CLEAR (A) CLEAR   Specific Gravity, Urine 1.001 (L) 1.005 - 1.030   pH 6.0 5.0 - 8.0   Glucose, UA NEGATIVE NEGATIVE mg/dL   Hgb urine dipstick NEGATIVE  NEGATIVE   Bilirubin Urine NEGATIVE NEGATIVE   Ketones, ur NEGATIVE NEGATIVE mg/dL   Protein, ur NEGATIVE  NEGATIVE mg/dL   Nitrite NEGATIVE NEGATIVE   Leukocytes,Ua NEGATIVE NEGATIVE   WBC, UA 0-5 0 - 5 WBC/hpf   Bacteria, UA NONE SEEN NONE SEEN   Squamous Epithelial / LPF NONE SEEN 0 - 5    Comment: Performed at [facility], [address], [place], [place] [address]  Urine Drug Screen, Qualitative ([facility] only)     Status: None   Collection Time: 11/15/18  1:58 AM  Result Value Ref Range   Tricyclic, Ur Screen NONE DETECTED NONE DETECTED   Amphetamines, Ur Screen NONE DETECTED NONE DETECTED   MDMA (Ecstasy)Ur Screen NONE DETECTED NONE DETECTED   Cocaine Metabolite,Ur [place] NONE DETECTED NONE DETECTED   Opiate, Ur Screen NONE DETECTED NONE DETECTED   Phencyclidine (PCP) Ur S NONE DETECTED NONE DETECTED   Cannabinoid 50 Ng, Ur [place] NONE DETECTED NONE DETECTED   Barbiturates, Ur Screen NONE DETECTED NONE DETECTED   Benzodiazepine, Ur Scrn NONE DETECTED NONE DETECTED   Methadone Scn, Ur NONE DETECTED NONE DETECTED    Comment: (NOTE) Tricyclics + metabolites, urine    Cutoff 1000 ng/mL Amphetamines + metabolites, urine  Cutoff 1000 ng/mL MDMA (Ecstasy), urine              Cutoff 500 ng/mL Cocaine Metabolite, urine          Cutoff 300 ng/mL Opiate + metabolites, urine        Cutoff 300 ng/mL Phencyclidine (PCP), urine         Cutoff 25 ng/mL Cannabinoid, urine                 Cutoff 50 ng/mL Barbiturates + metabolites, urine  Cutoff 200 ng/mL Benzodiazepine, urine              Cutoff 200 ng/mL Methadone, urine                   Cutoff 300 ng/mL The urine drug screen provides only a preliminary, unconfirmed analytical test result and should not be used for non-medical purposes. Clinical consideration and professional judgment should be applied to any positive drug screen result due to possible interfering substances. A more specific alternate chemical method must be used in order to obtain a confirmed analytical result. Gas chromatography / mass spectrometry (GC/MS) is the  preferred confirmat ory method. Performed at [facility], [address] [place], [place] [address]   Ethanol     Status: Abnormal   Collection Time: 11/15/18  1:58 AM  Result Value Ref Range   Alcohol, Ethyl (B) 139 (H) <10 mg/dL    Comment: (NOTE) Lowest detectable limit for serum alcohol is 10 mg/dL. For medical purposes only. Performed at [facility], [address] [place], [place] [address]   Acetaminophen level     Status: Abnormal   Collection Time: 11/15/18  1:58 AM  Result Value Ref Range   Acetaminophen (Tylenol), Serum <10 (L) 10 - 30 ug/mL    Comment: (NOTE) Therapeutic concentrations vary significantly. A range of 10-30 ug/mL  may be an effective concentration for many patients. However, some  are best treated at concentrations outside of this range. Acetaminophen concentrations >150 ug/mL at 4 hours after ingestion  and >50 ug/mL at 12 hours after ingestion are often associated with  toxic reactions. Performed at [facility], [address], [place], [place] [address]  No current facility-administered medications for this encounter.    Current Outpatient Medications  Medication Sig Dispense Refill  . hydrOXYzine (ATARAX/VISTARIL) 25 MG tablet Take 1 tablet (25 mg total) 3 (three) times daily as needed by mouth for itching. (Patient taking differently: Take 50 mg by mouth 2 (two) times a day. ) 30 tablet 0  . metoprolol succinate (TOPROL-XL) 25 MG 24 hr tablet Take 25 mg by mouth daily.    Marland Kitchen NIFEdipine (PROCARDIA XL/NIFEDICAL XL) 60 MG 24 hr tablet Take 60 mg by mouth daily.    . pantoprazole (PROTONIX) 40 MG tablet Take 40 mg by mouth 2 (two) times a day.    . promethazine (PHENERGAN) 25 MG tablet Take 1 tablet (25 mg total) by mouth every 6 (six) hours as needed for nausea or vomiting. (Patient taking differently: Take 25 mg by mouth 2 (two) times a day. ) 30 tablet 0  . VENTOLIN HFA 108 (90 Base) MCG/ACT inhaler INHALE 2  PUFFS BY MOUTH EVERY 4 TO 6 HOURS AS NEEDED. (Patient taking differently: Inhale 2 puffs into the lungs every 4 (four) hours as needed for wheezing or shortness of breath. ) 18 g 12  . vitamin B-12 (CYANOCOBALAMIN) 1000 MCG tablet Take 1,000 mcg by mouth daily.      Musculoskeletal: Strength & Muscle Tone: within normal limits Gait & Station: normal Patient leans: N/A  Psychiatric Specialty Exam: Physical Exam  Nursing note and vitals reviewed. Constitutional: He is oriented to person, place, and time. He appears well-developed.  HENT:  Head: Normocephalic.  Eyes: Pupils are equal, round, and reactive to light.  Neck: Normal range of motion. Neck supple.  Cardiovascular: Normal rate.  Respiratory: Effort normal.  Musculoskeletal: Normal range of motion.  Neurological: He is alert and oriented to person, place, and time.  Skin: Skin is warm and dry.    Review of Systems  Psychiatric/Behavioral: Positive for depression, substance abuse and suicidal ideas. The patient is nervous/anxious.   All other systems reviewed and are negative.   Blood pressure (!) 156/98, pulse (!) 118, temperature 99.1 F (37.3 C), temperature source Oral, resp. rate 20, height 6' (1.829 m), weight 120.2 kg, SpO2 98 %.Body mass index is 35.94 kg/m.  General Appearance: Disheveled  Eye Contact:  Good  Speech:  Clear and Coherent and Pressured  Volume:  Increased  Mood:  Euphoric and Irritable  Affect:  Congruent  Thought Process:  Coherent  Orientation:  Full (Time, Place, and Person)  Thought Content:  Logical and Obsessions  Suicidal Thoughts:  Yes.  with intent/plan  Homicidal Thoughts:  No  Memory:  Immediate;   Fair Recent;   Fair Remote;   Fair  Judgement:  Fair  Insight:  Lacking  Psychomotor Activity:  Decreased  Concentration:  Concentration: Good and Attention Span: Good  Recall:  Good  Fund of Knowledge:  Good  Language:  Good  Akathisia:  Negative  Handed:  Right  AIMS (if  indicated):     Assets:  Communication Skills Desire for Improvement Social Support  ADL's:  Intact  Cognition:  WNL  Sleep:   Good     Treatment Plan Summary: Medication management and Plan Patient meet criteria for psychiatric inpatient admission.  Disposition: Recommend psychiatric Inpatient admission when medically cleared. Supportive therapy provided about ongoing stressors.  Caroline Sauger, NP 11/15/2018 3:25 AM
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
## 2018-11-15 NOTE — ED Notes (Signed)
Patient ate His supper and had beverage, He is alert and oriented, up to bathroom, will continue to monitor.

## 2018-11-15 NOTE — ED Notes (Signed)
Patient is talking with his mom on the phone, no signs of distress.

## 2018-11-15 NOTE — BH Assessment (Signed)
Patient is to be admitted to [facility] by Psychiatric Nurse Practitioner [facility].  Attending Physician will be Dr. Weber Cooks.   Patient has been assigned to room 311, by [facility] Charge Nurse [place].   ER staff is aware of the admission:  Dr. Quentin Cornwall, ER MD   Abigail Butts, Patient's Nurse   Gust Rung., Patient Access.

## 2018-11-15 NOTE — ED Notes (Signed)
Referral information for Psychiatric Hospitalization faxed to;   . [facility] ([phone]-or- [phone]),   . [facility] [phone] -or- [phone]) [phone]  . [facility] ([phone]---[phone]---[phone]),  . [facility] ([phone], [phone], [phone] or [phone]),   . [facility] ([phone] or [phone])  . [facility] ([phone]),   . [facility] ([phone] -or- [phone]),   . [facility] ([phone])  

## 2018-11-15 NOTE — ED Notes (Signed)
Pt. Transferred from Triage to room after dressing out and screening for contraband. Report to include Situation, Background, Assessment and Recommendations from [place]. Pt. Oriented to Quad including Q15 minute rounds as well as Engineer, drilling for their protection. Patient is alert and oriented, warm and dry in no acute distress. Patient reported SI. Denied HI, and AVH. Pt. Encouraged to let me know if needs arise.
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
## 2018-11-15 NOTE — BH Assessment (Signed)
Assessment Note  Alan Eaton is an 43 y.o. male. Alan Eaton arrived to the ED by way of law enforcement. He reports, that he is here tonight  because of "The girl Crissy".  I took 8 Vicodin, I don't want to be here anymore, I just want to make everybody happy. Yeah, when she tells you that she is married and she just used you for money, that is gonna hurt.  Switch me to another hospital, because I don't like the hospital here".  It hurts that when you support her, but then to find out she is getting money from someone else, she is trying to bring me down.  You know, Sometimes, you hit that weak spot.  I got problems at home, My mother is real bad. My step dad is having problems with his legs, he had a stroke.  I am going to be taking care of these older people and I ain't got no help".   He states that he is depressed. He states that he has been feeling this way for 2 weeks.  He states that he has suicidal thoughts.  He reports sleeping more. He reports symptoms of anxiety. He denied having auditory or visual hallucinations.  He denied homicidal ideation or intent.  He denied the use of alcohol or drugs.  He reports family stressors due to being responsible for his adult parents.     Though, Alan Eaton denied use of alcohol or drugs, BAC = 139 at time of arrival.   Diagnosis: Depression  Past Medical History:  Past Medical History:  Diagnosis Date  . Alcohol abuse   . Bronchitis   . Depression   . Diabetes mellitus without complication ([place])    lost weight diet mgmt at present  . Drug abuse ([place])   . Family history of adverse reaction to anesthesia   . GERD (gastroesophageal reflux disease)   . Gout   . Hypertension   . Morbid obesity ([place])   . Suicide ideation     Past Surgical History:  Procedure Laterality Date  . none    . TRICEPS TENDON REPAIR Right 09/26/2018   Procedure: Debridement and primary repair of partial-thickness right distal triceps tendon tear. ;  Surgeon: Alan Mull, MD;  Location: [facility] ORS;  Service: Orthopedics;  Laterality: Right;  . UPPER GASTROINTESTINAL ENDOSCOPY      Family History:  Family History  Problem Relation Age of Onset  . Hypertension Other   . Diabetes Mellitus II Other     Social History:  reports that he has been smoking cigarettes. He has been smoking about 1.00 pack per day. His smokeless tobacco use includes snuff. He reports current alcohol use. He reports current drug use. Frequency: 1.00 time per week. Drugs: Marijuana and Cocaine.  Additional Social History:  Alcohol / Drug Use History of alcohol / drug use?: (No drug use in past 3 years)  CIWA: CIWA-Ar BP: (!) 156/98 Pulse Rate: (!) 118 COWS:    Allergies:  Allergies  Allergen Reactions  . Bee Venom Swelling  . Bactrim [Sulfamethoxazole-Trimethoprim] Swelling and Rash    Home Medications: (Not in a hospital admission)   OB/GYN Status:  No LMP for male patient.  General Assessment Data Location of Assessment: [facility] ED TTS Assessment: In system Is this a Tele or Face-to-Face Assessment?: Face-to-Face Is this an Initial Assessment or a Re-assessment for this encounter?: Initial Assessment Patient Accompanied by:: N/A Language Other than English: No Living Arrangements: Other (Comment)(Private residence)  What gender do you identify as?: Male Marital status: Divorced Pregnancy Status: No Living Arrangements: Parent Can pt return to current living arrangement?: Yes Admission Status: Voluntary Is patient capable of signing voluntary admission?: Yes Referral Source: Self/Family/Friend Insurance type: Medicaid, Medicare, Facilities manager Exam ([place]) Medical Exam completed: Yes  Crisis Care Plan Living Arrangements: Parent Legal Guardian: Other:(Self) Name of Psychiatrist: Dr. Gilberto Eaton Name of Therapist: None at this time  Education Status Is patient currently in school?: No Highest grade of school patient has  completed: high school diploma Is the patient employed, unemployed or receiving disability?: Receiving disability income  Risk to self with the past 6 months Suicidal Ideation: Yes-Currently Present Has patient been a risk to self within the past 6 months prior to admission? : Yes Suicidal Intent: Yes-Currently Present Has patient had any suicidal intent within the past 6 months prior to admission? : Yes Is patient at risk for suicide?: Yes Suicidal Plan?: Yes-Currently Present Has patient had any suicidal plan within the past 6 months prior to admission? : Yes Specify Current Suicidal Plan: Overdose on Vicodin Access to Means: Yes Specify Access to Suicidal Means: Access to medications What has been your use of drugs/alcohol within the last 12 months?: History of Drug use Previous Attempts/Gestures: Yes How many times?: 4 Other Self Harm Risks: cutting Triggers for Past Attempts: Unknown Intentional Self Injurious Behavior: Cutting Comment - Self Injurious Behavior: reports he cuts himself Family Suicide History: Yes(Uncle) Recent stressful life event(s): Other (Comment)(Relationship problems, family responsibilities) Persecutory voices/beliefs?: No Depression: Yes Depression Symptoms: Despondent, Tearfulness Substance abuse history and/or treatment for substance abuse?: Yes Suicide prevention information given to non-admitted patients: Not applicable  Risk to Others within the past 6 months Homicidal Ideation: No Does patient have any lifetime risk of violence toward others beyond the six months prior to admission? : No Thoughts of Harm to Others: No Current Homicidal Intent: No Current Homicidal Plan: No Access to Homicidal Means: No Identified Victim: None identified History of harm to others?: No Assessment of Violence: None Noted Does patient have access to weapons?: No Criminal Charges Pending?: No Does patient have a court date: No Is patient on probation?:  No  Psychosis Hallucinations: None noted Delusions: Grandiose  Mental Status Report Appearance/Hygiene: In scrubs Eye Contact: Fair Motor Activity: Unremarkable Speech: Logical/coherent, Tangential Level of Consciousness: Alert Mood: Euthymic Affect: Appropriate to circumstance Anxiety Level: None Thought Processes: Flight of Ideas Judgement: Partial Orientation: Appropriate for developmental age Obsessive Compulsive Thoughts/Behaviors: None  Cognitive Functioning Concentration: Normal Memory: Recent Intact Is patient IDD: No Insight: Poor Impulse Control: Poor Appetite: Good Have you had any weight changes? : No Change Sleep: Increased Vegetative Symptoms: Staying in bed  ADLScreening [facility] Assessment Services) Patient's cognitive ability adequate to safely complete daily activities?: Yes Patient able to express need for assistance with ADLs?: Yes Independently performs ADLs?: Yes (appropriate for developmental age)  Prior Inpatient Therapy Prior Inpatient Therapy: Yes Prior Therapy Dates: August 2020 and prior Prior Therapy Facilty/Provider(s): [facility], [facility], [facility], [place], [place] Reason for Treatment: Depression, Alcohol  Prior Outpatient Therapy Prior Outpatient Therapy: Yes Prior Therapy Dates: Current Prior Therapy Facilty/Provider(s): Alan Eaton - ([place]) Reason for Treatment: Depression Does patient have an ACCT team?: No Does patient have Intensive In-House Services?  : No Does patient have [facility] services? : No Does patient have P4CC services?: No  ADL Screening (condition at time of admission) Patient's cognitive ability adequate to safely complete daily  activities?: Yes Is the patient deaf or have difficulty hearing?: No Does the patient have difficulty seeing, even when wearing glasses/contacts?: No Does the patient have difficulty concentrating, remembering, or making decisions?: No Patient able to express need for  assistance with ADLs?: Yes Does the patient have difficulty dressing or bathing?: No Independently performs ADLs?: Yes (appropriate for developmental age) Does the patient have difficulty walking or climbing stairs?: No Weakness of Legs: None Weakness of Arms/Hands: None  Home Assistive Devices/Equipment Home Assistive Devices/Equipment: None                     Disposition:  Disposition Initial Assessment Completed for this Encounter: Yes  On Site Evaluation by:   Reviewed with Physician:    Elmer Bales 11/15/2018 3:18 AM

## 2018-11-15 NOTE — ED Provider Notes (Addendum)
[facility] Emergency Department Provider Note  ____________________________________________  Time seen: Approximately 2:55 AM  I have reviewed the triage vital signs and the nursing notes.   HISTORY  Chief Complaint Suicidal    HPI Alan Eaton is a 43 y.o. male with a history of depression diabetes drug abuse GERD hypertension obesity who comes to the ED complaining of suicide attempt by taking 8 Vicodin at home.  He states this is because his girlfriend is also having intercourse with another person which makes him very upset.  Also his mom and his stepdad have both been sick recently which makes him very upset.  He has had to help take care of them when he feels overwhelmed and hopeless.  He still endorses suicidal ideation.  No HI or hallucinations.  Symptoms are constant without alleviating factors.  Denies any current pain or recent illnesses.      Past Medical History:  Diagnosis Date  . Alcohol abuse   . Bronchitis   . Depression   . Diabetes mellitus without complication ([place])    lost weight diet mgmt at present  . Drug abuse ([place])   . Family history of adverse reaction to anesthesia   . GERD (gastroesophageal reflux disease)   . Gout   . Hypertension   . Morbid obesity ([place])   . Suicide ideation      Patient Active Problem List   Diagnosis Date Noted  . 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 MDD (major depressive disorder), severe ([place]) 10/31/2018  . Tachycardia 04/19/2016  . Factitious disorder 01/13/2016  . Unspecified depressive disorder 01/13/2016  . Alcohol use disorder, severe, dependence ([place]) 01/13/2016  . 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 HTN (hypertension) 01/05/2016  . Gout 01/05/2016  . GERD (gastroesophageal reflux disease) 01/05/2016  . Diabetes ([place]) 01/05/2016  . Tobacco use disorder 01/05/2016  . Obesity 10/27/2015     Past Surgical History:  Procedure Laterality  Date  . none    . TRICEPS TENDON REPAIR Right 09/26/2018   Procedure: Debridement and primary repair of partial-thickness right distal triceps tendon tear. ;  Surgeon: Corky Mull, MD;  Location: [facility] ORS;  Service: Orthopedics;  Laterality: Right;  . UPPER GASTROINTESTINAL ENDOSCOPY       Prior to Admission medications   Medication Sig Start Date End Date Taking? Authorizing Provider  hydrOXYzine (ATARAX/VISTARIL) 25 MG tablet Take 1 tablet (25 mg total) 3 (three) times daily as needed by mouth for itching. Patient taking differently: Take 50 mg by mouth 2 (two) times a day.  01/24/17   Mikey College, NP  metoprolol succinate (TOPROL-XL) 25 MG 24 hr tablet Take 25 mg by mouth daily.    [provider]  NIFEdipine (PROCARDIA XL/NIFEDICAL XL) 60 MG 24 hr tablet Take 60 mg by mouth daily.    [provider]  pantoprazole (PROTONIX) 40 MG tablet Take 40 mg by mouth 2 (two) times a day.    [provider]  promethazine (PHENERGAN) 25 MG tablet Take 1 tablet (25 mg total) by mouth every 6 (six) hours as needed for nausea or vomiting. Patient taking differently: Take 25 mg by mouth 2 (two) times a day.  09/14/16   Mikey College, NP  VENTOLIN HFA 108 (90 Base) MCG/ACT inhaler INHALE 2 PUFFS BY MOUTH EVERY 4 TO 6 HOURS AS NEEDED. Patient taking differently: Inhale 2 puffs into the lungs every 4 (four) hours as needed for wheezing or shortness  of breath.  11/18/15   Arlis Porta., MD  vitamin B-12 (CYANOCOBALAMIN) 1000 MCG tablet Take 1,000 mcg by mouth daily.    [provider]     Allergies Bee venom and Bactrim [sulfamethoxazole-trimethoprim]   Family History  Problem Relation Age of Onset  . Hypertension Other   . Diabetes Mellitus II Other     Social History Social History   Tobacco Use  . Smoking status: Current Every Day Smoker    Packs/day: 1.00    Types: Cigarettes  . Smokeless tobacco: Current User    Types: Snuff   Substance Use Topics  . Alcohol use: Yes    Alcohol/week: 0.0 standard drinks    Comment: 14 beers today  . Drug use: Yes    Frequency: 1.0 times per week    Types: Marijuana, Cocaine    Comment: last used cocaine Monday; last smoked marijuana monday    Review of Systems  Constitutional:   No fever or chills.  ENT:   No sore throat. No rhinorrhea. Cardiovascular:   No chest pain or syncope. Respiratory:   No dyspnea or cough. Gastrointestinal:   Negative for abdominal pain, vomiting and diarrhea.  Musculoskeletal:   Negative for focal pain or swelling All other systems reviewed and are negative except as documented above in ROS and HPI.  ____________________________________________   PHYSICAL EXAM:  VITAL SIGNS: ED Triage Vitals  Enc Vitals Group     BP 11/15/18 0149 (!) 156/98     Pulse Rate 11/15/18 0149 (!) 118     Resp 11/15/18 0149 20     Temp 11/15/18 0149 99.1 F (37.3 C)     Temp Source 11/15/18 0149 Oral     SpO2 11/15/18 0149 98 %     Weight 11/15/18 0150 265 lb (120.2 kg)     Height 11/15/18 0150 6' (1.829 m)     Head Circumference --      Peak Flow --      Pain Score 11/15/18 0149 0     Pain Loc --      Pain Edu? --      Excl. in [place]? --     Vital signs reviewed, nursing assessments reviewed.   Constitutional:   Alert and oriented. Non-toxic appearance. Eyes:   Conjunctivae are normal. EOMI. PERRL. ENT      Head:   Normocephalic and atraumatic.        Neck:   No meningismus. Full ROM. Hematological/Lymphatic/Immunilogical:   No cervical lymphadenopathy. Cardiovascular:   RRR. . Cap refill less than 2 seconds. Respiratory:   Normal respiratory effort without tachypnea/retractions.  Gastrointestinal:   Soft and nontender. Non distended. There is no CVA tenderness.  No rebound, rigidity, or guarding.  Musculoskeletal:   Normal range of motion in all extremities. No joint effusions.  No lower extremity tenderness.  No edema. Neurologic:   Normal  speech and language.  Motor grossly intact. No acute focal neurologic deficits are appreciated.  Skin:    Skin is warm, dry and intact. No rash noted.  No petechiae, purpura, or bullae.  ____________________________________________    LABS (pertinent positives/negatives) (all labs ordered are listed, but only abnormal results are displayed) Labs Reviewed  CBC - Abnormal; Notable for the following components:      Result Value   WBC 12.4 (*)    All other components within normal limits  COMPREHENSIVE METABOLIC PANEL - Abnormal; Notable for the following components:   Creatinine, Ser 1.26 (*)  All other components within normal limits  URINALYSIS, COMPLETE (UACMP) WITH MICROSCOPIC - Abnormal; Notable for the following components:   Color, Urine COLORLESS (*)    APPearance CLEAR (*)    Specific Gravity, Urine 1.001 (*)    All other components within normal limits  ETHANOL - Abnormal; Notable for the following components:   Alcohol, Ethyl (B) 139 (*)    All other components within normal limits  URINE DRUG SCREEN, QUALITATIVE ([facility] ONLY)  SALICYLATE LEVEL  ACETAMINOPHEN LEVEL   ____________________________________________   EKG    ____________________________________________    RADIOLOGY  No results found.  ____________________________________________   PROCEDURES Procedures  ____________________________________________    CLINICAL IMPRESSION / ASSESSMENT AND PLAN / ED COURSE  Medications ordered in the ED: Medications - No data to display  Pertinent labs & imaging results that were available during my care of the patient were reviewed by me and considered in my medical decision making (see chart for details).  Alan Eaton was evaluated in Emergency Department on 11/15/2018 for the symptoms described in the history of present illness. He was evaluated in the context of the global COVID-19 pandemic, which necessitated consideration that the patient might  be at risk for infection with the SARS-CoV-2 virus that causes COVID-19. Institutional protocols and algorithms that pertain to the evaluation of patients at risk for COVID-19 are in a state of rapid change based on information released by regulatory bodies including the CDC and federal and state organizations. These policies and algorithms were followed during the patient's care in the ED.     Clinical Course as of Nov 15 346  Wed Nov 15, 2018  0221 Patient presents with suicidal ideation with an intentional overdose on Vicodin tonight in a suicide attempt which she attributes to feeling overwhelmed and no longer able to deal with his sexual partner cheating on him and with his parents being sick recently.  Denies any coingestants.   [PS]  0257 Ingestion was at 1 AM.  Will need a 5 AM Tylenol level unless initial Tylenol level is negative.   [PS]  [phone] Discussed with psychiatry who plans to admit.  Tylenol level negative, no further monitoring needed.   [PS]    Clinical Course User Index [PS] Carrie Mew, MD     ----------------------------------------- 2:57 AM on 11/15/2018 -----------------------------------------  IVC for safety pending psychiatry evaluation.  ____________________________________________   FINAL CLINICAL IMPRESSION(S) / ED DIAGNOSES    Final diagnoses:  Suicidal behavior with attempted self-injury [facility])  Alcoholic intoxication with complication [facility])     ED Discharge Orders    None      Portions of this note were generated with dragon dictation software. Dictation errors may occur despite best attempts at proofreading.   Carrie Mew, MD 11/15/18 [id number]    Carrie Mew, MD 11/15/18 [phone]

## 2018-11-15 NOTE — ED Notes (Signed)
Patient ate lunch and beverage, ate 100% of lunch, no signs of distress, will continue to monitor, q 15 minute checks and camera surveillance in progress for safety.

## 2018-11-15 NOTE — ED Notes (Signed)
Pt. Transferred to [place] from ED to room 2 after screening for contraband. Report to include Situation, Background, Assessment and Recommendations from [place]. Pt. Oriented to unit including Q15 minute rounds as well as the security cameras for their protection. Patient is alert and oriented, warm and dry in no acute distress. Pt. Encouraged to let me know if needs arise.

## 2018-11-16 DIAGNOSIS — F4325 Adjustment disorder with mixed disturbance of emotions and conduct: Principal | ICD-10-CM

## 2018-11-16 MED ORDER — ALBUTEROL SULFATE (2.5 MG/3ML) 0.083% IN NEBU: 2.5000 mg | INHALATION_SOLUTION | Freq: Four times a day (QID) | RESPIRATORY_TRACT | Status: DC | PRN

## 2018-11-16 MED ORDER — VITAMIN B-12 1000 MCG PO TABS
1000.0000 ug | ORAL_TABLET | Freq: Every day | ORAL | Status: DC
  Administered 2018-11-16 – 2018-11-22 (×7): 1000 ug via ORAL
  Filled 2018-11-16 (×7): qty 1

## 2018-11-16 MED ORDER — PANTOPRAZOLE SODIUM 40 MG PO TBEC
40.0000 mg | DELAYED_RELEASE_TABLET | Freq: Every day | ORAL | Status: DC
  Administered 2018-11-16 – 2018-11-22 (×7): 40 mg via ORAL
  Filled 2018-11-16 (×7): qty 1

## 2018-11-16 MED ORDER — METOPROLOL SUCCINATE ER 25 MG PO TB24: 25.0000 mg | ORAL_TABLET | Freq: Every day | ORAL | Status: DC

## 2018-11-16 MED ORDER — NIFEDIPINE ER 60 MG PO TB24
60.0000 mg | ORAL_TABLET | Freq: Every day | ORAL | Status: DC
  Administered 2018-11-16 – 2018-11-22 (×7): 60 mg via ORAL
  Filled 2018-11-16 (×8): qty 1

## 2018-11-16 NOTE — BHH Group Notes (Signed)
LCSW Group Therapy Note  11/16/2018 12:28 PM  Type of Therapy/Topic:  Group Therapy:  Balance in Life  Participation Level:  Minimal  Description of Group:    This group will address the concept of balance and how it feels and looks when one is unbalanced. Patients will be encouraged to process areas in their lives that are out of balance and identify reasons for remaining unbalanced. Facilitators will guide patients in utilizing problem-solving interventions to address and correct the stressor making their life unbalanced. Understanding and applying boundaries will be explored and addressed for obtaining and maintaining a balanced life. Patients will be encouraged to explore ways to assertively make their unbalanced needs known to significant others in their lives, using other group members and facilitator for support and feedback.  Therapeutic Goals: 1. Patient will identify two or more emotions or situations they have that consume much of in their lives. 2. Patient will identify signs/triggers that life has become out of balance:  3. Patient will identify two ways to set boundaries in order to achieve balance in their lives:  4. Patient will demonstrate ability to communicate their needs through discussion and/or role plays  Summary of Patient Progress: Pt was present in group and participated. Pt often expanded on what others said and expressed his personal experience. Pt reported that something that can throw him off balance is not being sober and relationships with others.     Therapeutic Modalities:   Cognitive Behavioral Therapy Solution-Focused Therapy Assertiveness Training  Evalina Field, MSW, LCSW Clinical Social Work 11/16/2018 12:28 PM

## 2018-11-16 NOTE — BHH Group Notes (Signed)
[place] Group Notes:  (Nursing/MHT/Case Management/Adjunct)  Date:  11/16/2018  Time:  9:06 PM  Type of Therapy:  Group Therapy  Participation Level:  Active  Participation Quality:  Intrusive and Took over group talking about they lie to him about him not  having  his test t  Affect:  Anxious  Cognitive:  Alert  Insight:  Limited  Engagement in Group:  Engaged  Modes of Intervention:  Discussion  Summary of Progress/Problems:  Alan Eaton 11/16/2018, 9:06 PM
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
## 2018-11-16 NOTE — Progress Notes (Signed)
Pt enjoyed group and outdoor activities today. He did ask for a prn for anxiety this evening. Collier Bullock RN

## 2018-11-16 NOTE — H&P (Signed)
Psychiatric Admission Assessment Adult  Patient Identification: Alan Eaton MRN:  [id number] Date of Evaluation:  11/16/2018 Chief Complaint:  depression Principal Diagnosis: Adjustment disorder with mixed disturbance of emotions and conduct Diagnosis:  Principal Problem:   Adjustment disorder with mixed disturbance of emotions and conduct Active Problems:   HTN (hypertension)   Alcohol use disorder, severe, dependence ([place])  History of Present Illness: Patient seen and chart reviewed.  Patient familiar from prior encounters.  He called 911 himself and told police that he had taken an overdose of Vicodin and needed to go to the hospital.  On presentation to the emergency room he had a blood alcohol level indicating intoxication.  Some documentation initially says that he denied alcohol and drug abuse but the story eventually settled on his saying that he had taken 8 Vicodin tablets in a suicide attempt.  He says that he did this because the same girlfriend he was with last admission and has continued to be involved in his life and he "just" found out that she was married and is in fact a prostitute.  I should say that this story itself does not make much sense since that was exactly what he had "just" discovered last time he was admitted.  Anyway that is the reason that he has been emotionally distressed.  He claims that he has not been drinking every day the last 2 weeks since discharge but only the day that he came in.  Denied that he has been abusing any other drugs and claims to be compliant with his outpatient medicine.  Has not gone to any outpatient actual treatment since then.  Denies psychotic symptoms.  Denies active current suicidal ideation.  Patient has been upbeat and Marolyn Hammock on the ward interacting well with peers and staff. Associated Signs/Symptoms: Depression Symptoms:  depressed mood, suicidal thoughts with specific plan, (Hypo) Manic Symptoms:  Impulsivity, Anxiety Symptoms:   Excessive Worry, Psychotic Symptoms:  None PTSD Symptoms: Negative Total Time spent with patient: 1 hour  Past Psychiatric History: Patient has a long history of alcohol abuse and reports of suicidal ideation.  He has frequently threatened suicide or claimed to have tried to harm himself.  Some evidence that possibly at times he is actually tried to harm himself.  Antidepressants in the past have proved to be of little benefit.  Once he is sober his mood tends to come back to baseline.  As long as he is around people and can perform and be social he feels pretty happy.  He claims he had an extended period of sobriety recently before his last admission.  Is the patient at risk to self? Yes.    Has the patient been a risk to self in the past 6 months? Yes.    Has the patient been a risk to self within the distant past? Yes.    Is the patient a risk to others? No.  Has the patient been a risk to others in the past 6 months? No.  Has the patient been a risk to others within the distant past? No.   Prior Inpatient Therapy:   Prior Outpatient Therapy:    Alcohol Screening: 1. How often do you have a drink containing alcohol?: 2 to 4 times a month 2. How many drinks containing alcohol do you have on a typical day when you are drinking?: 3 or 4 3. How often do you have six or more drinks on one occasion?: Less than monthly AUDIT-C Score: 4 4. How  often during the last year have you found that you were not able to stop drinking once you had started?: Less than monthly 5. How often during the last year have you failed to do what was normally expected from you becasue of drinking?: Less than monthly 6. How often during the last year have you needed a first drink in the morning to get yourself going after a heavy drinking session?: Less than monthly 7. How often during the last year have you had a feeling of guilt of remorse after drinking?: Less than monthly 8. How often during the last year have you  been unable to remember what happened the night before because you had been drinking?: Less than monthly 9. Have you or someone else been injured as a result of your drinking?: No 10. Has a relative or friend or a doctor or another health worker been concerned about your drinking or suggested you cut down?: No Alcohol Use Disorder Identification Test Final Score (AUDIT): 9 Alcohol Brief Interventions/Follow-up: Alcohol Education Substance Abuse History in the last 12 months:  Yes.   Consequences of Substance Abuse: Medical Consequences:  Worsening of depression with claims of suicidal ideation Previous Psychotropic Medications: Yes  Psychological Evaluations: Yes  Past Medical History:  Past Medical History:  Diagnosis Date  . Alcohol abuse   . Bronchitis   . Depression   . Diabetes mellitus without complication ([place])    lost weight diet mgmt at present  . Drug abuse ([place])   . Family history of adverse reaction to anesthesia   . GERD (gastroesophageal reflux disease)   . Gout   . Hypertension   . Morbid obesity ([place])   . Suicide ideation     Past Surgical History:  Procedure Laterality Date  . none    . TRICEPS TENDON REPAIR Right 09/26/2018   Procedure: Debridement and primary repair of partial-thickness right distal triceps tendon tear. ;  Surgeon: Corky Mull, MD;  Location: [facility] ORS;  Service: Orthopedics;  Laterality: Right;  . UPPER GASTROINTESTINAL ENDOSCOPY     Family History:  Family History  Problem Relation Age of Onset  . Hypertension Other   . Diabetes Mellitus II Other    Family Psychiatric  History: None reported Tobacco Screening: Have you used any form of tobacco in the last 30 days? (Cigarettes, Smokeless Tobacco, Cigars, and/or Pipes): Yes Tobacco use, Select all that apply: 5 or more cigarettes per day Are you interested in Tobacco Cessation Medications?: Yes, will notify MD for an order Counseled patient on smoking cessation including recognizing danger  situations, developing coping skills and basic information about quitting provided: Yes Social History:  Social History   Substance and Sexual Activity  Alcohol Use Yes  . Alcohol/week: 0.0 standard drinks   Comment: 14 beers today     Social History   Substance and Sexual Activity  Drug Use Yes  . Frequency: 1.0 times per week  . Types: Marijuana, Cocaine   Comment: last used cocaine Monday; last smoked marijuana monday    Additional Social History: Marital status: Divorced Divorced, when?: "few years ago" What types of issues is patient dealing with in the relationship?: Pt recently got out of a relationship due to finding out she was married Are you sexually active?: Yes What is your sexual orientation?: Heterosexual Has your sexual activity been affected by drugs, alcohol, medication, or emotional stress?: pt denies Does patient have children?: Yes How many children?: 6 How is patient's relationship with their children?: "  they are alright, they live with their mothers"                         Allergies:   Allergies  Allergen Reactions  . Bee Venom Swelling  . Bactrim [Sulfamethoxazole-Trimethoprim] Swelling and Rash   Lab Results:  Results for orders placed or performed during the hospital encounter of 11/15/18 (from the past 48 hour(s))  CBC     Status: Abnormal   Collection Time: 11/15/18  1:58 AM  Result Value Ref Range   WBC 12.4 (H) 4.0 - 10.5 K/uL   RBC 4.70 4.22 - 5.81 MIL/uL   Hemoglobin 14.0 13.0 - 17.0 g/dL   HCT 41.7 39.0 - 52.0 %   MCV 88.7 80.0 - 100.0 fL   MCH 29.8 26.0 - 34.0 pg   MCHC 33.6 30.0 - 36.0 g/dL   RDW 13.5 11.5 - 15.5 %   Platelets 250 150 - 400 K/uL   nRBC 0.0 0.0 - 0.2 %    Comment: Performed at [facility], [address] [place], [place] [address]  Comprehensive metabolic panel     Status: Abnormal   Collection Time: 11/15/18  1:58 AM  Result Value Ref Range   Sodium 139 135 - 145 mmol/L   Potassium 3.5  3.5 - 5.1 mmol/L   Chloride 107 98 - 111 mmol/L   CO2 22 22 - 32 mmol/L   Glucose, Bld 98 70 - 99 mg/dL   BUN 12 6 - 20 mg/dL   Creatinine, Ser 1.26 (H) 0.61 - 1.24 mg/dL   Calcium 9.0 8.9 - 10.3 mg/dL   Total Protein 7.7 6.5 - 8.1 g/dL   Albumin 4.3 3.5 - 5.0 g/dL   AST 22 15 - 41 U/L   ALT 23 0 - 44 U/L   Alkaline Phosphatase 58 38 - 126 U/L   Total Bilirubin 0.3 0.3 - 1.2 mg/dL   GFR calc non Af Amer >60 >60 mL/min   GFR calc Af Amer >60 >60 mL/min   Anion gap 10 5 - 15    Comment: Performed at [facility], [address] [place], [place] [id number]  Salicylate level     Status: None   Collection Time: 11/15/18  1:58 AM  Result Value Ref Range   Salicylate Lvl [id number] 2.8 - 30.0 mg/dL    Comment: Performed at [facility], [address] [place], [place] [address]  Urinalysis, Complete w Microscopic     Status: Abnormal   Collection Time: 11/15/18  1:58 AM  Result Value Ref Range   Color, Urine COLORLESS (A) YELLOW   APPearance CLEAR (A) CLEAR   Specific Gravity, Urine 1.001 (L) 1.005 - 1.030   pH 6.0 5.0 - 8.0   Glucose, UA NEGATIVE NEGATIVE mg/dL   Hgb urine dipstick NEGATIVE NEGATIVE   Bilirubin Urine NEGATIVE NEGATIVE   Ketones, ur NEGATIVE NEGATIVE mg/dL   Protein, ur NEGATIVE NEGATIVE mg/dL   Nitrite NEGATIVE NEGATIVE   Leukocytes,Ua NEGATIVE NEGATIVE   WBC, UA 0-5 0 - 5 WBC/hpf   Bacteria, UA NONE SEEN NONE SEEN   Squamous Epithelial / LPF NONE SEEN 0 - 5    Comment: Performed at [facility], [address], [place], [place] [address]  Urine Drug Screen, Qualitative ([place] only)     Status: None   Collection Time: 11/15/18  1:58 AM  Result Value Ref Range   Tricyclic, Ur Screen NONE DETECTED NONE DETECTED  Amphetamines, Ur Screen NONE DETECTED NONE DETECTED   MDMA (Ecstasy)Ur Screen NONE DETECTED NONE DETECTED   Cocaine Metabolite,Ur [place] NONE DETECTED NONE DETECTED   Opiate, Ur Screen NONE DETECTED NONE DETECTED   Phencyclidine  (PCP) Ur S NONE DETECTED NONE DETECTED   Cannabinoid 50 Ng, Ur [place] NONE DETECTED NONE DETECTED   Barbiturates, Ur Screen NONE DETECTED NONE DETECTED   Benzodiazepine, Ur Scrn NONE DETECTED NONE DETECTED   Methadone Scn, Ur NONE DETECTED NONE DETECTED    Comment: (NOTE) Tricyclics + metabolites, urine    Cutoff 1000 ng/mL Amphetamines + metabolites, urine  Cutoff 1000 ng/mL MDMA (Ecstasy), urine              Cutoff 500 ng/mL Cocaine Metabolite, urine          Cutoff 300 ng/mL Opiate + metabolites, urine        Cutoff 300 ng/mL Phencyclidine (PCP), urine         Cutoff 25 ng/mL Cannabinoid, urine                 Cutoff 50 ng/mL Barbiturates + metabolites, urine  Cutoff 200 ng/mL Benzodiazepine, urine              Cutoff 200 ng/mL Methadone, urine                   Cutoff 300 ng/mL The urine drug screen provides only a preliminary, unconfirmed analytical test result and should not be used for non-medical purposes. Clinical consideration and professional judgment should be applied to any positive drug screen result due to possible interfering substances. A more specific alternate chemical method must be used in order to obtain a confirmed analytical result. Gas chromatography / mass spectrometry (GC/MS) is the preferred confirmat ory method. Performed at [facility], [address] [place], [place] [address]   Ethanol     Status: Abnormal   Collection Time: 11/15/18  1:58 AM  Result Value Ref Range   Alcohol, Ethyl (B) 139 (H) <10 mg/dL    Comment: (NOTE) Lowest detectable limit for serum alcohol is 10 mg/dL. For medical purposes only. Performed at [facility], [address] [place], [place] [address]   Acetaminophen level     Status: Abnormal   Collection Time: 11/15/18  1:58 AM  Result Value Ref Range   Acetaminophen (Tylenol), Serum <10 (L) 10 - 30 ug/mL    Comment: (NOTE) Therapeutic concentrations vary significantly. A range of 10-30 ug/mL  may be  an effective concentration for many patients. However, some  are best treated at concentrations outside of this range. Acetaminophen concentrations >150 ug/mL at 4 hours after ingestion  and >50 ug/mL at 12 hours after ingestion are often associated with  toxic reactions. Performed at [facility], [address] [place], [place] [address]   SARS Coronavirus 2 [facility] [place] order, Performed in [facility] hospital lab) Nasopharyngeal Nasopharyngeal Swab     Status: None   Collection Time: 11/15/18  5:00 PM   Specimen: Nasopharyngeal Swab  Result Value Ref Range   SARS Coronavirus 2 NEGATIVE NEGATIVE    Comment: (NOTE) If result is NEGATIVE SARS-CoV-2 target nucleic acids are NOT DETECTED. The SARS-CoV-2 RNA is generally detectable in upper and lower  respiratory specimens during the acute phase of infection. The lowest  concentration of SARS-CoV-2 viral copies this assay can detect is 250  copies / mL. A negative result does not preclude SARS-CoV-2 infection  and should not be used  as the sole basis for treatment or other  patient management decisions.  A negative result may occur with  improper specimen collection / handling, submission of specimen other  than nasopharyngeal swab, presence of viral mutation(s) within the  areas targeted by this assay, and inadequate number of viral copies  (<250 copies / mL). A negative result must be combined with clinical  observations, patient history, and epidemiological information. If result is POSITIVE SARS-CoV-2 target nucleic acids are DETECTED. The SARS-CoV-2 RNA is generally detectable in upper and lower  respiratory specimens dur ing the acute phase of infection.  Positive  results are indicative of active infection with SARS-CoV-2.  Clinical  correlation with patient history and other diagnostic information is  necessary to determine patient infection status.  Positive results do  not rule out bacterial infection or  co-infection with other viruses. If result is PRESUMPTIVE POSTIVE SARS-CoV-2 nucleic acids MAY BE PRESENT.   A presumptive positive result was obtained on the submitted specimen  and confirmed on repeat testing.  While 2019 novel coronavirus  (SARS-CoV-2) nucleic acids may be present in the submitted sample  additional confirmatory testing may be necessary for epidemiological  and / or clinical management purposes  to differentiate between  SARS-CoV-2 and other Sarbecovirus currently known to infect humans.  If clinically indicated additional testing with an alternate test  methodology [phone]) is advised. The SARS-CoV-2 RNA is generally  detectable in upper and lower respiratory sp ecimens during the acute  phase of infection. The expected result is Negative. Fact Sheet for Patients:  StrictlyIdeas.no Fact Sheet for Healthcare Providers: BankingDealers.co.za This test is not yet approved or cleared by the [place] FDA and has been authorized for detection and/or diagnosis of SARS-CoV-2 by FDA under an Emergency Use Authorization (EUA).  This EUA will remain in effect (meaning this test can be used) for the duration of the COVID-19 declaration under Section 564(b)(1) of the Act, 21 U.S.C. section 360bbb-3(b)(1), unless the authorization is terminated or revoked sooner. Performed at [facility], [address] [place], [place] [address]     Blood Alcohol level:  Lab Results  Component Value Date   ETH 139 (H) 11/15/2018   ETH 244 (H) [id number]    Metabolic Disorder Labs:  Lab Results  Component Value Date   HGBA1C 6.0 06/14/2016   MPG 126 01/03/2016   No results found for: PROLACTIN Lab Results  Component Value Date   CHOL 121 04/20/2016   TRIG 88 04/20/2016   HDL 38 (L) 04/20/2016   CHOLHDL 3.2 04/20/2016   VLDL 18 04/20/2016   LDLCALC 65 04/20/2016   LDLCALC 77 01/03/2016    Current  Medications: Current Facility-Administered Medications  Medication Dose Route Frequency Provider Last Rate Last Dose  . acetaminophen (TYLENOL) tablet 650 mg  650 mg Oral Q6H PRN Money, [name], [name]      . albuterol (PROVENTIL) (2.5 MG/3ML) 0.083% nebulizer solution 2.5 mg  2.5 mg Inhalation Q6H PRN Mane Consolo T, MD      . alum & mag hydroxide-simeth (MAALOX/MYLANTA) 200-200-20 MG/5ML suspension 30 mL  30 mL Oral Q4H PRN Money, [name], [name]      . hydrOXYzine (ATARAX/VISTARIL) tablet 25 mg  25 mg Oral TID PRN Money, [name], [name]   25 mg at 11/16/18 0647  . magnesium hydroxide (MILK OF MAGNESIA) suspension 30 mL  30 mL Oral Daily PRN Money, [name], [name]      . metoprolol succinate (TOPROL-XL) 24 hr  tablet 25 mg  25 mg Oral Daily Caroline Sauger, NP   25 mg at 11/16/18 0646  . NIFEdipine (ADALAT CC) 24 hr tablet 60 mg  60 mg Oral Daily Galen Russman T, MD      . pantoprazole (PROTONIX) EC tablet 40 mg  40 mg Oral Daily Shariah Assad T, MD      . traZODone (DESYREL) tablet 50 mg  50 mg Oral QHS PRN Money, [name], [name]      . vitamin B-12 (CYANOCOBALAMIN) tablet 1,000 mcg  1,000 mcg Oral Daily Milea Klink, Madie Reno, MD       PTA Medications: Medications Prior to Admission  Medication Sig Dispense Refill Last Dose  . hydrOXYzine (ATARAX/VISTARIL) 50 MG tablet Take 50 mg by mouth 2 (two) times daily.   11/14/2018 at Unknown time  . metoprolol succinate (TOPROL-XL) 25 MG 24 hr tablet Take 25 mg by mouth daily.   11/14/2018 at Unknown time  . NIFEdipine (PROCARDIA XL/NIFEDICAL XL) 60 MG 24 hr tablet Take 60 mg by mouth daily.   11/14/2018 at Unknown time  . pantoprazole (PROTONIX) 40 MG tablet Take 40 mg by mouth 2 (two) times a day.   11/14/2018 at Unknown time  . promethazine (PHENERGAN) 25 MG tablet Take 25 mg by mouth 2 (two) times daily.   11/14/2018 at Unknown time  . VENTOLIN HFA 108 (90 Base) MCG/ACT inhaler INHALE 2 PUFFS BY MOUTH EVERY 4 TO 6 HOURS AS NEEDED. (Patient taking differently:  Inhale 2 puffs into the lungs every 4 (four) hours as needed for wheezing or shortness of breath. ) 18 g 12 prn at prn  . vitamin B-12 (CYANOCOBALAMIN) 1000 MCG tablet Take 1,000 mcg by mouth daily.   11/14/2018 at Unknown time  . VIVITROL 380 MG SUSR 380 mg every 30 (thirty) days.   Past Month at Unknown time    Musculoskeletal: Strength & Muscle Tone: within normal limits Gait & Station: normal Patient leans: N/A  Psychiatric Specialty Exam: Physical Exam  Nursing note and vitals reviewed. Constitutional: He appears well-developed and well-nourished.  HENT:  Head: Normocephalic and atraumatic.  Eyes: Pupils are equal, round, and reactive to light. Conjunctivae are normal.  Neck: Normal range of motion.  Cardiovascular: Regular rhythm and normal heart sounds.  Respiratory: Effort normal. No respiratory distress.  GI: Soft.  Musculoskeletal: Normal range of motion.  Neurological: He is alert.  Skin: Skin is warm and dry.  Psychiatric: His speech is normal and behavior is normal. Thought content normal. Cognition and memory are normal. He expresses impulsivity. He exhibits a depressed mood.    Review of Systems  Constitutional: Negative.   HENT: Negative.   Eyes: Negative.   Respiratory: Negative.   Cardiovascular: Negative.   Gastrointestinal: Negative.   Musculoskeletal: Negative.   Skin: Negative.   Neurological: Negative.   Psychiatric/Behavioral: Positive for depression, substance abuse and suicidal ideas. Negative for hallucinations and memory loss. The patient is nervous/anxious. The patient does not have insomnia.     Blood pressure (!) 156/100, pulse 76, temperature 97.7 F (36.5 C), temperature source Oral, resp. rate 18, height 6' (1.829 m), weight 131.5 kg, SpO2 96 %.Body mass index is 39.33 kg/m.  General Appearance: Casual  Eye Contact:  Fair  Speech:  Clear and Coherent  Volume:  Normal  Mood:  Dysphoric  Affect:  Full Range  Thought Process:  Coherent   Orientation:  Full (Time, Place, and Person)  Thought Content:  Logical and Rumination  Suicidal Thoughts:  No  Homicidal Thoughts:  No  Memory:  Immediate;   Fair Recent;   Fair Remote;   Fair  Judgement:  Impaired  Insight:  Lacking  Psychomotor Activity:  Decreased  Concentration:  Concentration: Fair  Recall:  [employer] of Knowledge:  Fair  Language:  Fair  Akathisia:  No  Handed:  Right  AIMS (if indicated):     Assets:  Desire for Improvement Housing Resilience  ADL's:  Intact  Cognition:  WNL  Sleep:  Number of Hours: 6.75    Treatment Plan Summary: Daily contact with patient to assess and evaluate symptoms and progress in treatment, Medication management and Plan Patient with a history of alcohol and drug abuse and mood symptoms.  Presented claiming that he had overdosed on Vicodin.  This sounds unlikely since he was not obtunded on presentation and his drug screen is negative.  Nevertheless he had been drinking.  Patient's affect and mood appear to be pretty much at baseline now.  He is requesting to go to [facility] in [place].  Given that he seems to be in the midst of a full-blown relapse this seems like it is probably reasonable and we can look into it.  Meanwhile restart his blood pressure medicine and other regular medical medicine.  Does not seem likely to need alcohol withdrawal treatment.  Engage in individual and group therapy did counsel eighth.  No real indication for antidepressant medicine  Observation Level/Precautions:  15 minute checks  Laboratory:  Chemistry Profile  Psychotherapy:    Medications:    Consultations:    Discharge Concerns:    Estimated LOS:  Other:     Physician Treatment Plan for Primary Diagnosis: Adjustment disorder with mixed disturbance of emotions and conduct Long Term Goal(s): Improvement in symptoms so as ready for discharge  Short Term Goals: Ability to disclose and discuss  suicidal ideas and Ability to demonstrate self-control will improve  Physician Treatment Plan for Secondary Diagnosis: Principal Problem:   Adjustment disorder with mixed disturbance of emotions and conduct Active Problems:   HTN (hypertension)   Alcohol use disorder, severe, dependence ([place])  Long Term Goal(s): Improvement in symptoms so as ready for discharge  Short Term Goals: Ability to maintain clinical measurements within normal limits will improve and Ability to identify triggers associated with substance abuse/mental health issues will improve  I certify that inpatient services furnished can reasonably be expected to improve the patient's condition.    Alethia Berthold, MD 8/27/20204:31 PM

## 2018-11-16 NOTE — [facility] Counselor (Signed)
CSW spoke with the patient regarding outpatient therapy.  Pt declined to sign consent for outpatient referral.  Assunta Curtis, MSW, LCSW 11/16/2018 11:37 AM

## 2018-11-16 NOTE — [facility] Suicide Risk Assessment (Signed)
Standard INPATIENT:  Family/Significant Other Suicide Prevention Education  Suicide Prevention Education:  Patient Refusal for Family/Significant Other Suicide Prevention Education: The patient Alan Eaton has refused to provide written consent for family/significant other to be provided Family/Significant Other Suicide Prevention Education during admission and/or prior to discharge.  Physician notified.  SPE completed with pt, as pt refused to consent to family contact. SPI pamphlet provided to pt and pt was encouraged to share information with support network, ask questions, and talk about any concerns relating to SPE. Pt denies access to guns/firearms and verbalized understanding of information provided. Mobile Crisis information also provided to pt.   Rozann Lesches 11/16/2018, 11:36 AM

## 2018-11-16 NOTE — Progress Notes (Signed)
Recreation Therapy Notes   Date: 11/16/2018  Time: 9:30 am  Location: Craft room  Behavioral response: Appropriate   Intervention Topic: Happiness   Discussion/Intervention:  Group content today was focused on Happiness. The group defined happiness and described where happiness comes from. Individuals identified what makes them happy and how they go about making others happy. Patients expressed things that stop them from being happy and ways they can improve their happiness. The group stated reasons why it is important to be happy. The group participated in the intervention "My Happiness", where they had a chance to identify and express things that make them happy. Clinical Observations/Feedback:  Patient came to group and defined happiness as helping others. He stated that happiness comes from the heart and love. Participant stated that you are responsible for your own happiness. Patient explained the last time he was happy is when he was singing karaoke. Individual was social with peers and staff while participating in the intervention.    Benigna Delisi LRT/CTRS         Jacia Sickman 11/16/2018 11:07 AM

## 2018-11-16 NOTE — [facility] Suicide Risk Assessment (Signed)
[facility] Admission Suicide Risk Assessment   Nursing information obtained from:  Patient Demographic factors:  Caucasian Current Mental Status:  NA Loss Factors:  Loss of significant relationship Historical Factors:  NA Risk Reduction Factors:  NA  Total Time spent with patient: 1 hour Principal Problem: Adjustment disorder with mixed disturbance of emotions and conduct Diagnosis:  Principal Problem:   Adjustment disorder with mixed disturbance of emotions and conduct Active Problems:   HTN (hypertension)   Alcohol use disorder, severe, dependence ([place])  Subjective Data: Patient called police and claimed that he had overdosed on Vicodin.  Claimed to have suicidal ideation.  Came to the emergency room talking about being sad because he just discovered that his "girlfriend" is actually a prostitute.  Patient was intoxicated with alcohol at the time but the rest of his drug screen was negative.  Currently the patient denies any active suicidal intent.  Claims to still be "depressed" but it is not congruent with his current affect.  He is asking for referral to substance abuse treatment  Continued Clinical Symptoms:  Alcohol Use Disorder Identification Test Final Score (AUDIT): 9 The "Alcohol Use Disorders Identification Test", Guidelines for Use in Primary Care, Second Edition.  World Pharmacologist [facility]). Score between 0-7:  no or low risk or alcohol related problems. Score between 8-15:  moderate risk of alcohol related problems. Score between 16-19:  high risk of alcohol related problems. Score 20 or above:  warrants further diagnostic evaluation for alcohol dependence and treatment.   CLINICAL FACTORS:   Depression:   Impulsivity Alcohol/Substance Abuse/Dependencies   Musculoskeletal: Strength & Muscle Tone: within normal limits Gait & Station: normal Patient leans: N/A  Psychiatric Specialty Exam: Physical Exam  Nursing note and vitals reviewed. Constitutional: He appears  well-developed and well-nourished.  HENT:  Head: Normocephalic and atraumatic.  Eyes: Pupils are equal, round, and reactive to light. Conjunctivae are normal.  Neck: Normal range of motion.  Cardiovascular: Regular rhythm and normal heart sounds.  Respiratory: Effort normal. No respiratory distress.  GI: Soft.  Musculoskeletal: Normal range of motion.  Neurological: He is alert.  Skin: Skin is warm and dry.  Psychiatric: His speech is normal and behavior is normal. Judgment and thought content normal. Cognition and memory are normal. He exhibits a depressed mood.    Review of Systems  Constitutional: Negative.   HENT: Negative.   Eyes: Negative.   Respiratory: Negative.   Cardiovascular: Negative.   Gastrointestinal: Negative.   Musculoskeletal: Negative.   Skin: Negative.   Neurological: Negative.   Psychiatric/Behavioral: Positive for depression, substance abuse and suicidal ideas. Negative for hallucinations and memory loss. The patient is nervous/anxious. The patient does not have insomnia.     Blood pressure (!) 156/100, pulse 76, temperature 97.7 F (36.5 C), temperature source Oral, resp. rate 18, height 6' (1.829 m), weight 131.5 kg, SpO2 96 %.Body mass index is 39.33 kg/m.  General Appearance: Casual  Eye Contact:  Fair  Speech:  Clear and Coherent  Volume:  Decreased  Mood:  Dysphoric  Affect:  Full Range  Thought Process:  Coherent  Orientation:  Full (Time, Place, and Person)  Thought Content:  Tangential  Suicidal Thoughts:  Yes.  without intent/plan  Homicidal Thoughts:  No  Memory:  Immediate;   Fair Recent;   Fair Remote;   Fair  Judgement:  Impaired  Insight:  Shallow  Psychomotor Activity:  Normal  Concentration:  Concentration: Fair  Recall:  [employer] of Knowledge:  Fair  Language:  Fair  Akathisia:  No  Handed:  Right  AIMS (if indicated):     Assets:  Desire for Improvement Housing Resilience  ADL's:  Intact  Cognition:  WNL  Sleep:   Number of Hours: 6.75      COGNITIVE FEATURES THAT CONTRIBUTE TO RISK:  Loss of executive function    SUICIDE RISK:   Mild:  Suicidal ideation of limited frequency, intensity, duration, and specificity.  There are no identifiable plans, no associated intent, mild dysphoria and related symptoms, good self-control (both objective and subjective assessment), few other risk factors, and identifiable protective factors, including available and accessible social support.  PLAN OF CARE: Patient admitted to the psychiatric unit.  Continue 15-minute checks.  He is at high risk long-term for continued statements of suicidal ideation and possible suicide "attempt" behavior but is at low risk for trying to harm himself or acting out in the hospital.  Plan is to medically stabilize him and consider looking into referral to substance abuse treatment as he requests.  I certify that inpatient services furnished can reasonably be expected to improve the patient's condition.   Alethia Berthold, MD 11/16/2018, 4:26 PM

## 2018-11-17 NOTE — Progress Notes (Addendum)
Recreation Therapy Notes   Date: 11/17/2018  Time: 1:00 pm  Location: Craft room  Behavioral response: Appropriate  Group Type: Leisure  Participation level: Active  Communication: Patient was social with peers and staff.  Comments: N/A  Latravion Graves LRT/CTRS        Jisella Ashenfelter 11/17/2018 1:53 PM

## 2018-11-17 NOTE — Progress Notes (Signed)
D: Patient has been in bed sleeping all shift. He had gone outside with the patients earlier in the day and was singing and appearing to have a good time. Awakens when spoken to. Denies SI, HI and AVH.  A: Continue to monitor for safety R: Safety maintained.

## 2018-11-17 NOTE — Tx Team (Signed)
Interdisciplinary Treatment and Diagnostic Plan Update  11/17/2018 Time of Session: 2:30PM Alan Eaton MRN: [id number]  Principal Diagnosis: Adjustment disorder with mixed disturbance of emotions and conduct  Secondary Diagnoses: Principal Problem:   Adjustment disorder with mixed disturbance of emotions and conduct Active Problems:   HTN (hypertension)   Alcohol use disorder, severe, dependence ([place])   Current Medications:  Current Facility-Administered Medications  Medication Dose Route Frequency Provider Last Rate Last Dose  . acetaminophen (TYLENOL) tablet 650 mg  650 mg Oral Q6H PRN Money, [name], [name]      . albuterol (PROVENTIL) (2.5 MG/3ML) 0.083% nebulizer solution 2.5 mg  2.5 mg Inhalation Q6H PRN Clapacs, John T, MD      . alum & mag hydroxide-simeth (MAALOX/MYLANTA) 200-200-20 MG/5ML suspension 30 mL  30 mL Oral Q4H PRN Money, [name], [name]      . hydrOXYzine (ATARAX/VISTARIL) tablet 25 mg  25 mg Oral TID PRN Money, [name], [name]   25 mg at 11/16/18 1816  . magnesium hydroxide (MILK OF MAGNESIA) suspension 30 mL  30 mL Oral Daily PRN Money, [name], [name]      . metoprolol succinate (TOPROL-XL) 24 hr tablet 25 mg  25 mg Oral Daily Caroline Sauger, NP   25 mg at 11/17/18 0848  . NIFEdipine (ADALAT CC) 24 hr tablet 60 mg  60 mg Oral Daily Clapacs, Madie Reno, MD   60 mg at 11/17/18 0848  . pantoprazole (PROTONIX) EC tablet 40 mg  40 mg Oral Daily Clapacs, Madie Reno, MD   40 mg at 11/17/18 0848  . traZODone (DESYREL) tablet 50 mg  50 mg Oral QHS PRN Money, [name], [name]      . vitamin B-12 (CYANOCOBALAMIN) tablet 1,000 mcg  1,000 mcg Oral Daily Clapacs, Madie Reno, MD   1,000 mcg at 11/17/18 0848   PTA Medications: Medications Prior to Admission  Medication Sig Dispense Refill Last Dose  . hydrOXYzine (ATARAX/VISTARIL) 50 MG tablet Take 50 mg by mouth 2 (two) times daily.   11/14/2018 at Unknown time  . metoprolol succinate (TOPROL-XL) 25 MG 24 hr tablet Take 25 mg by mouth  daily.   11/14/2018 at Unknown time  . NIFEdipine (PROCARDIA XL/NIFEDICAL XL) 60 MG 24 hr tablet Take 60 mg by mouth daily.   11/14/2018 at Unknown time  . pantoprazole (PROTONIX) 40 MG tablet Take 40 mg by mouth 2 (two) times a day.   11/14/2018 at Unknown time  . promethazine (PHENERGAN) 25 MG tablet Take 25 mg by mouth 2 (two) times daily.   11/14/2018 at Unknown time  . VENTOLIN HFA 108 (90 Base) MCG/ACT inhaler INHALE 2 PUFFS BY MOUTH EVERY 4 TO 6 HOURS AS NEEDED. (Patient taking differently: Inhale 2 puffs into the lungs every 4 (four) hours as needed for wheezing or shortness of breath. ) 18 g 12 prn at prn  . vitamin B-12 (CYANOCOBALAMIN) 1000 MCG tablet Take 1,000 mcg by mouth daily.   11/14/2018 at Unknown time  . VIVITROL 380 MG SUSR 380 mg every 30 (thirty) days.   Past Month at Unknown time    Patient Stressors: Financial difficulties Marital or family conflict Occupational concerns  Patient Strengths: Ability for insight Active sense of humor Capable of independent living Motivation for treatment/growth  Treatment Modalities: Medication Management, Group therapy, Case management,  1 to 1 session with clinician, Psychoeducation, Recreational therapy.   Physician Treatment Plan for Primary Diagnosis: Adjustment disorder with mixed disturbance of emotions and conduct Long Term  Goal(s): Improvement in symptoms so as ready for discharge Improvement in symptoms so as ready for discharge   Short Term Goals: Ability to disclose and discuss suicidal ideas Ability to demonstrate self-control will improve Ability to maintain clinical measurements within normal limits will improve Ability to identify triggers associated with substance abuse/mental health issues will improve  Medication Management: Evaluate patient's response, side effects, and tolerance of medication regimen.  Therapeutic Interventions: 1 to 1 sessions, Unit Group sessions and Medication administration.  Evaluation of  Outcomes: Progressing  Physician Treatment Plan for Secondary Diagnosis: Principal Problem:   Adjustment disorder with mixed disturbance of emotions and conduct Active Problems:   HTN (hypertension)   Alcohol use disorder, severe, dependence ([place])  Long Term Goal(s): Improvement in symptoms so as ready for discharge Improvement in symptoms so as ready for discharge   Short Term Goals: Ability to disclose and discuss suicidal ideas Ability to demonstrate self-control will improve Ability to maintain clinical measurements within normal limits will improve Ability to identify triggers associated with substance abuse/mental health issues will improve     Medication Management: Evaluate patient's response, side effects, and tolerance of medication regimen.  Therapeutic Interventions: 1 to 1 sessions, Unit Group sessions and Medication administration.  Evaluation of Outcomes: Progressing   RN Treatment Plan for Primary Diagnosis: Adjustment disorder with mixed disturbance of emotions and conduct Long Term Goal(s): Knowledge of disease and therapeutic regimen to maintain health will improve  Short Term Goals: Ability to verbalize frustration and anger appropriately will improve, Ability to demonstrate self-control, Ability to participate in decision making will improve, Ability to verbalize feelings will improve, Ability to disclose and discuss suicidal ideas and Ability to identify and develop effective coping behaviors will improve  Medication Management: RN will administer medications as ordered by provider, will assess and evaluate patient's response and provide education to patient for prescribed medication. RN will report any adverse and/or side effects to prescribing provider.  Therapeutic Interventions: 1 on 1 counseling sessions, Psychoeducation, Medication administration, Evaluate responses to treatment, Monitor vital signs and CBGs as ordered, Perform/monitor CIWA, COWS, AIMS and  Fall Risk screenings as ordered, Perform wound care treatments as ordered.  Evaluation of Outcomes: Progressing   LCSW Treatment Plan for Primary Diagnosis: Adjustment disorder with mixed disturbance of emotions and conduct Long Term Goal(s): Safe transition to appropriate next level of care at discharge, Engage patient in therapeutic group addressing interpersonal concerns.  Short Term Goals: Engage patient in aftercare planning with referrals and resources, Increase social support, Increase ability to appropriately verbalize feelings, Increase emotional regulation, Facilitate acceptance of mental health diagnosis and concerns and Facilitate patient progression through stages of change regarding substance use diagnoses and concerns  Therapeutic Interventions: Assess for all discharge needs, 1 to 1 time with Social worker, Explore available resources and support systems, Assess for adequacy in community support network, Educate family and significant other(s) on suicide prevention, Complete Psychosocial Assessment, Interpersonal group therapy.  Evaluation of Outcomes: Progressing   Progress in Treatment: Attending groups: Yes. Participating in groups: Yes. Taking medication as prescribed: Yes. Toleration medication: Yes. Family/Significant other contact made: No, will contact:  pt declined Patient understands diagnosis: Yes. Discussing patient identified problems/goals with staff: Yes. Medical problems stabilized or resolved: Yes. Denies suicidal/homicidal ideation: Yes. Issues/concerns per patient self-inventory: No. Other: none  New problem(s) identified: No, Describe:  none  New Short Term/Long Term Goal(s): detox, medication management for mood stabilization; elimination of SI thoughts; development of comprehensive mental wellness/sobriety plan.  Patient Goals:  "  get better and stomp the stuff that I am doing"  Discharge Plan or Barriers: Pt reports that he wants to be referred  to [facility], referral was sent on 11/17/2018.  Patient reports that if he can not go to [facility] he wants to be discharged to the shelter and does not plan on returning to his mothers home.   Reason for Continuation of Hospitalization: Depression Medication stabilization Suicidal ideation  Estimated Length of Stay: 1-7 days  Attendees:  Patient: Alan Eaton 11/17/2018 3:13 PM  Physician: Dr. Weber Cooks 11/17/2018 3:13 PM  Nursing: West Pugh, RN 11/17/2018 3:13 PM  RN Care Manager: 11/17/2018 3:13 PM  Social Worker: Assunta Curtis, LCSW 11/17/2018 3:13 PM  Recreational Therapist: Roanna Epley, Reather Converse, LRT 11/17/2018 3:13 PM  Other:  11/17/2018 3:13 PM  Other:  11/17/2018 3:13 PM  Other: 11/17/2018 3:13 PM    Scribe for Treatment Team: Rozann Lesches, LCSW 11/17/2018 3:13 PM

## 2018-11-17 NOTE — [facility] Counselor (Signed)
CSW called [facility] to provide the authorization number.  CSW spoke with [id number].    CSW was informed that the referral has not been reviewed by the physician at this time.  Assunta Curtis, MSW, LCSW 11/17/2018 3:37 PM

## 2018-11-17 NOTE — Progress Notes (Signed)
Recreation Therapy Notes   Date: 11/17/2018  Time: 9:30 am  Location: Craft room  Behavioral response: Appropriate   Intervention Topic: Emotions   Discussion/Intervention:  Group content on today was focused on emotions. The group identified what emotions are and why it is important to have emotions. Patients expressed some positive and negative emotions. Individuals gave some past experiences on how they normally dealt with emotions in the past. The group described some positive ways to deal with emotions in the future. Patients participated in the intervention "Name the Jerl Santos" where individuals were given a chance to experience different emotions.  Clinical Observations/Feedback:  Patient came to group and defined emotions as accepting things and happiness. He stated that emotions come from the heart. Participant described a common emotion he feels a lot is depression. Individual was social with peers and staff while participating in the intervention.    Bradley Bostelman LRT/CTRS          Joyce Heitman 11/17/2018 11:15 AM

## 2018-11-17 NOTE — Plan of Care (Signed)
Patient is pleasant and cooperative on approach.Denies SI,HI and AVH.Appropriate with staff & peers.Attended groups.Compliant with medications.Appetite and energy level good.Support and encouragement given.

## 2018-11-17 NOTE — H&P (Signed)
CSW faxed [facility] referral to [facility] and [employer].  All faxes were successful.   CSW is awaiting authorization number and approval/denial for the patient for treatment.  Assunta Curtis, MSW, LCSW 11/17/2018 12:11 PM

## 2018-11-17 NOTE — Progress Notes (Signed)
[facility] MD Progress Note  11/17/2018 4:20 PM Alan Eaton  MRN:  [id number] Subjective: Follow-up for this patient with alcohol abuse and mood symptoms.  Patient came to treatment team today.  He is not even describing himself as depressed anymore.  Mood is feeling more or less okay.  He is clearly having a good time on the unit.  Physically stable.  He continues to say that he would like to go to the inpatient substance abuse program in [place] and we have sent off an application.  No new medical problems no new mood issues that would require specific treatment Principal Problem: Adjustment disorder with mixed disturbance of emotions and conduct Diagnosis: Principal Problem:   Adjustment disorder with mixed disturbance of emotions and conduct Active Problems:   HTN (hypertension)   Alcohol use disorder, severe, dependence (HCC)  Total Time spent with patient: 20 minutes  Past Psychiatric History: Past history of recurrent episodes of alcohol abuse accompanied by mood symptoms and suicidal statements  Past Medical History:  Past Medical History:  Diagnosis Date  . Alcohol abuse   . Bronchitis   . Depression   . Diabetes mellitus without complication ([place])    lost weight diet mgmt at present  . Drug abuse ([place])   . Family history of adverse reaction to anesthesia   . GERD (gastroesophageal reflux disease)   . Gout   . Hypertension   . Morbid obesity ([place])   . Suicide ideation     Past Surgical History:  Procedure Laterality Date  . none    . TRICEPS TENDON REPAIR Right 09/26/2018   Procedure: Debridement and primary repair of partial-thickness right distal triceps tendon tear. ;  Surgeon: Corky Mull, MD;  Location: [facility] ORS;  Service: Orthopedics;  Laterality: Right;  . UPPER GASTROINTESTINAL ENDOSCOPY     Family History:  Family History  Problem Relation Age of Onset  . Hypertension Other   . Diabetes Mellitus II Other    Family Psychiatric  History: See previous Social  History:  Social History   Substance and Sexual Activity  Alcohol Use Yes  . Alcohol/week: 0.0 standard drinks   Comment: 14 beers today     Social History   Substance and Sexual Activity  Drug Use Yes  . Frequency: 1.0 times per week  . Types: Marijuana, Cocaine   Comment: last used cocaine Monday; last smoked marijuana monday    Social History   Socioeconomic History  . Marital status: Divorced    Spouse name: Not on file  . Number of children: Not on file  . Years of education: Not on file  . Highest education level: Not on file  Occupational History  . Not on file  Social Needs  . Financial resource strain: Not on file  . Food insecurity    Worry: Not on file    Inability: Not on file  . Transportation needs    Medical: Not on file    Non-medical: Not on file  Tobacco Use  . Smoking status: Current Every Day Smoker    Packs/day: 1.00    Types: Cigarettes  . Smokeless tobacco: Current User    Types: Snuff  Substance and Sexual Activity  . Alcohol use: Yes    Alcohol/week: 0.0 standard drinks    Comment: 14 beers today  . Drug use: Yes    Frequency: 1.0 times per week    Types: Marijuana, Cocaine    Comment: last used cocaine Monday; last smoked marijuana monday  .  Sexual activity: Not on file  Lifestyle  . Physical activity    Days per week: Not on file    Minutes per session: Not on file  . Stress: Not on file  Relationships  . Social Herbalist on phone: Not on file    Gets together: Not on file    Attends religious service: Not on file    Active member of club or organization: Not on file    Attends meetings of clubs or organizations: Not on file    Relationship status: Not on file  Other Topics Concern  . Not on file  Social History Narrative  . Not on file   Additional Social History:                         Sleep: Fair  Appetite:  Fair  Current Medications: Current Facility-Administered Medications  Medication  Dose Route Frequency Provider Last Rate Last Dose  . acetaminophen (TYLENOL) tablet 650 mg  650 mg Oral Q6H PRN Money, [name], [name]      . albuterol (PROVENTIL) (2.5 MG/3ML) 0.083% nebulizer solution 2.5 mg  2.5 mg Inhalation Q6H PRN ,  T, MD      . alum & mag hydroxide-simeth (MAALOX/MYLANTA) 200-200-20 MG/5ML suspension 30 mL  30 mL Oral Q4H PRN Money, [name], [name]      . hydrOXYzine (ATARAX/VISTARIL) tablet 25 mg  25 mg Oral TID PRN Money, [name], [name]   25 mg at 11/16/18 1816  . magnesium hydroxide (MILK OF MAGNESIA) suspension 30 mL  30 mL Oral Daily PRN Money, [name], [name]      . metoprolol succinate (TOPROL-XL) 24 hr tablet 25 mg  25 mg Oral Daily Caroline Sauger, NP   25 mg at 11/17/18 0848  . NIFEdipine (ADALAT CC) 24 hr tablet 60 mg  60 mg Oral Daily , Madie Reno, MD   60 mg at 11/17/18 0848  . pantoprazole (PROTONIX) EC tablet 40 mg  40 mg Oral Daily , Madie Reno, MD   40 mg at 11/17/18 0848  . traZODone (DESYREL) tablet 50 mg  50 mg Oral QHS PRN Money, [name], [name]      . vitamin B-12 (CYANOCOBALAMIN) tablet 1,000 mcg  1,000 mcg Oral Daily , Madie Reno, MD   1,000 mcg at 11/17/18 0848    Lab Results:  Results for orders placed or performed during the hospital encounter of 11/15/18 (from the past 48 hour(s))  SARS Coronavirus 2 [facility] order, Performed in [facility] hospital lab) Nasopharyngeal Nasopharyngeal Swab     Status: None   Collection Time: 11/15/18  5:00 PM   Specimen: Nasopharyngeal Swab  Result Value Ref Range   SARS Coronavirus 2 NEGATIVE NEGATIVE    Comment: (NOTE) If result is NEGATIVE SARS-CoV-2 target nucleic acids are NOT DETECTED. The SARS-CoV-2 RNA is generally detectable in upper and lower  respiratory specimens during the acute phase of infection. The lowest  concentration of SARS-CoV-2 viral copies this assay can detect is 250  copies / mL. A negative result does not preclude SARS-CoV-2 infection  and should not be used as  the sole basis for treatment or other  patient management decisions.  A negative result may occur with  improper specimen collection / handling, submission of specimen other  than nasopharyngeal swab, presence of viral mutation(s) within the  areas targeted by this assay, and inadequate number of viral copies  (<250 copies /  mL). A negative result must be combined with clinical  observations, patient history, and epidemiological information. If result is POSITIVE SARS-CoV-2 target nucleic acids are DETECTED. The SARS-CoV-2 RNA is generally detectable in upper and lower  respiratory specimens dur ing the acute phase of infection.  Positive  results are indicative of active infection with SARS-CoV-2.  Clinical  correlation with patient history and other diagnostic information is  necessary to determine patient infection status.  Positive results do  not rule out bacterial infection or co-infection with other viruses. If result is PRESUMPTIVE POSTIVE SARS-CoV-2 nucleic acids MAY BE PRESENT.   A presumptive positive result was obtained on the submitted specimen  and confirmed on repeat testing.  While 2019 novel coronavirus  (SARS-CoV-2) nucleic acids may be present in the submitted sample  additional confirmatory testing may be necessary for epidemiological  and / or clinical management purposes  to differentiate between  SARS-CoV-2 and other Sarbecovirus currently known to infect humans.  If clinically indicated additional testing with an alternate test  methodology [phone]) is advised. The SARS-CoV-2 RNA is generally  detectable in upper and lower respiratory sp ecimens during the acute  phase of infection. The expected result is Negative. Fact Sheet for Patients:  StrictlyIdeas.no Fact Sheet for Healthcare Providers: BankingDealers.co.za This test is not yet approved or cleared by the [place] FDA and has been authorized for  detection and/or diagnosis of SARS-CoV-2 by FDA under an Emergency Use Authorization (EUA).  This EUA will remain in effect (meaning this test can be used) for the duration of the COVID-19 declaration under Section 564(b)(1) of the Act, 21 U.S.C. section 360bbb-3(b)(1), unless the authorization is terminated or revoked sooner. Performed at [facility], [address] [place], [place] [address]     Blood Alcohol level:  Lab Results  Component Value Date   ETH 139 (H) 11/15/2018   ETH 244 (H) [id number]    Metabolic Disorder Labs: Lab Results  Component Value Date   HGBA1C 6.0 06/14/2016   MPG 126 01/03/2016   No results found for: PROLACTIN Lab Results  Component Value Date   CHOL 121 04/20/2016   TRIG 88 04/20/2016   HDL 38 (L) 04/20/2016   CHOLHDL 3.2 04/20/2016   VLDL 18 04/20/2016   LDLCALC 65 04/20/2016   LDLCALC 77 01/03/2016    Physical Findings: AIMS: Facial and Oral Movements Muscles of Facial Expression: None, normal Jaw: None, normal Tongue: None, normal,Extremity Movements Upper (arms, wrists, hands, fingers): None, normal Lower (legs, knees, ankles, toes): None, normal, Trunk Movements Neck, shoulders, hips: None, normal, Overall Severity Severity of abnormal movements (highest score from questions above): None, normal Incapacitation due to abnormal movements: None, normal Patient's awareness of abnormal movements (rate only patient's report): No Awareness, Dental Status Current problems with teeth and/or dentures?: No Does patient usually wear dentures?: No  CIWA:  CIWA-Ar Total: 2 COWS:  COWS Total Score: 4  Musculoskeletal: Strength & Muscle Tone: within normal limits Gait & Station: normal Patient leans: N/A  Psychiatric Specialty Exam: Physical Exam  Nursing note and vitals reviewed. Constitutional: He appears well-developed and well-nourished.  HENT:  Head: Normocephalic and atraumatic.  Eyes: Pupils are equal, round, and  reactive to light. Conjunctivae are normal.  Neck: Normal range of motion.  Cardiovascular: Regular rhythm and normal heart sounds.  Respiratory: Effort normal. No respiratory distress.  GI: Soft.  Musculoskeletal: Normal range of motion.  Neurological: He is alert.  Skin: Skin is warm and dry.  Psychiatric: He  has a normal mood and affect. His speech is normal and behavior is normal. Judgment and thought content normal. Cognition and memory are normal.    Review of Systems  Constitutional: Negative.   HENT: Negative.   Eyes: Negative.   Respiratory: Negative.   Cardiovascular: Negative.   Gastrointestinal: Negative.   Musculoskeletal: Negative.   Skin: Negative.   Neurological: Negative.   Psychiatric/Behavioral: Negative.     Blood pressure 124/88, pulse 99, temperature 98.6 F (37 C), temperature source Oral, resp. rate 18, height 6' (1.829 m), weight 131.5 kg, SpO2 100 %.Body mass index is 39.33 kg/m.  General Appearance: Casual  Eye Contact:  Good  Speech:  Normal Rate  Volume:  Normal  Mood:  Euthymic  Affect:  Congruent  Thought Process:  Goal Directed  Orientation:  Full (Time, Place, and Person)  Thought Content:  Logical  Suicidal Thoughts:  No  Homicidal Thoughts:  No  Memory:  Immediate;   Fair Recent;   Fair Remote;   Fair  Judgement:  Fair  Insight:  Fair  Psychomotor Activity:  Decreased  Concentration:  Concentration: Fair  Recall:  [employer] of Knowledge:  Fair  Language:  Fair  Akathisia:  No  Handed:  Right  AIMS (if indicated):     Assets:  Desire for Improvement  ADL's:  Intact  Cognition:  WNL  Sleep:  Number of Hours: 7     Treatment Plan Summary: Daily contact with patient to assess and evaluate symptoms and progress in treatment, Medication management and Plan Patient with alcohol abuse and mood symptoms personality disorder.  Seems to be emotionally baseline.  Not clear if he has a place to go back to stay although I expect his  mother would probably let him come home.  Patient has been referred to the alcohol and drug abuse treatment center although if we do not have positive word after the weekend I would expect likely discharge at that time.  Encourage that he uses time to engage in reflection and thoughts about how to get back into extended sobriety.  Alethia Berthold, MD 11/17/2018, 4:20 PM

## 2018-11-17 NOTE — Plan of Care (Signed)
  Problem: Coping: Goal: Coping ability will improve Outcome: Progressing  D: Patient has been in bed sleeping all shift. He had gone outside with the patients earlier in the day and was singing and appearing to have a good time. Awakens when spoken to. Denies SI, HI and AVH.  A: Continue to monitor for safety R: Safety maintained.

## 2018-11-17 NOTE — Progress Notes (Signed)
Recreation Therapy Notes  INPATIENT RECREATION THERAPY ASSESSMENT  Patient Details Name: Alan Eaton MRN: [id number] DOB: 1975/07/11 Today's Date: 11/17/2018       Information Obtained From: Patient  Able to Participate in Assessment/Interview: Yes  Patient Presentation: Responsive  Reason for Admission (Per Patient): Active Symptoms  Patient Stressors:    Coping Skills:   Music, Talk  Leisure Interests (2+):  Music - Listen, Music - Singing  Frequency of Recreation/Participation:    Awareness of Community Resources:     [employer]:     Current Use:    If no, Barriers?:    Expressed Interest in [employer] Information:    [place] of Residence:  Insurance underwriter  Patient Main Form of Transportation: Musician  Patient Strengths:  Caring  Patient Identified Areas of Improvement:  my choices  Patient Goal for Hospitalization:  Focus on me  Current SI (including self-harm):  No  Current HI:  No  Current AVH: No  Staff Intervention Plan: Collaborate with Interdisciplinary Treatment Team, Group Attendance  Consent to Intern Participation: N/A  Daniya Aramburo 11/17/2018, 2:25 PM

## 2018-11-17 NOTE — BHH Group Notes (Signed)
[place] Group Notes:  (Nursing/MHT/Case Management/Adjunct)  Date:  11/17/2018  Time:  9:28 PM  Type of Therapy:  Wrap Up Group  Participation Level:  Did Not Attend   Adela Lank [facility] 11/17/2018, 9:28 PM

## 2018-11-17 NOTE — Progress Notes (Signed)
Patient is pleasant and having fun in the unit , singing and being flamboyant with peers , patient is maintaining safety , takes his medications and sleep long hours , denies any distress or depressed, denies SI/HIAVH , patient is stable no distress.

## 2018-11-18 LAB — HIV ANTIBODY (ROUTINE TESTING W REFLEX): HIV Screen 4th Generation wRfx: NONREACTIVE

## 2018-11-18 MED ORDER — PHENOBARBITAL 32.4 MG PO TABS
32.4000 mg | ORAL_TABLET | Freq: Two times a day (BID) | ORAL | Status: AC
  Administered 2018-11-18 – 2018-11-19 (×4): 32.4 mg via ORAL
  Filled 2018-11-18 (×4): qty 1

## 2018-11-18 MED ORDER — GABAPENTIN 300 MG PO CAPS
300.0000 mg | ORAL_CAPSULE | Freq: Three times a day (TID) | ORAL | Status: DC
  Administered 2018-11-18 – 2018-11-22 (×12): 300 mg via ORAL
  Filled 2018-11-18 (×13): qty 1

## 2018-11-18 MED ORDER — METOPROLOL SUCCINATE ER 25 MG PO TB24
50.0000 mg | ORAL_TABLET | Freq: Every day | ORAL | Status: DC
  Administered 2018-11-19 – 2018-11-22 (×4): 50 mg via ORAL
  Filled 2018-11-18 (×5): qty 2

## 2018-11-18 NOTE — Plan of Care (Signed)
Active in the milieu, eating and sleeping well. Attending groups and taking medications as scheduled. Denying suicidal/homicidal thoughts. Denying hallucinations.
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
## 2018-11-18 NOTE — Plan of Care (Signed)
  Problem: Education: Goal: Ability to make informed decisions regarding treatment will improve Outcome: Progressing  D: Patient's mother in to visit. Mother is asking when patient will be discharged. Stated that he has an appointment to get a Vivitrol injection at an outpatient facility in [place] on Tuesday. Informed patient and mother that the team is waiting to find out if patient has been accepted to [facility]. Referral has been made. Mother would like for patient to be discharged on Monday if he does not get into [facility] so he can keep his appointment on Tuesday and then go to [facility] from home once that find out that he has been accepted. Patient says he wants to go from here to [place] without going home to await placement. He says he does not want to go home because his mom is too overprotective. Patient's mood and affect are anxious. Denies SI, HI and AVH. Contracting for safety. A: Continue to monitor for safety. Encourage patient to follow-up with treatment team on Monday to discuss his concerns and his mother's concerns regarding discharge. R: Safety maintained.

## 2018-11-18 NOTE — Progress Notes (Signed)
D: Patient's mother in to visit. Mother is asking when patient will be discharged. Stated that he has an appointment to get a Vivitrol injection at an outpatient facility in [place] on Tuesday. Informed patient and mother that the team is waiting to find out if patient has been accepted to [facility]. Referral has been made. Mother would like for patient to be discharged on Monday if he does not get into [facility] so he can keep his appointment on Tuesday and then go to [facility] from home once that find out that he has been accepted. Patient says he wants to go from here to [place] without going home to await placement. He says he does not want to go home because his mom is too overprotective. Patient's mood and affect are anxious. Denies SI, HI and AVH. Contracting for safety. A: Continue to monitor for safety. Encourage patient to follow-up with treatment team on Monday to discuss his concerns and his mother's concerns regarding discharge. R: Safety maintained.

## 2018-11-18 NOTE — Progress Notes (Signed)
[facility] MD Progress Note  11/18/2018 8:42 AM Alan Eaton  MRN:  [id number] Subjective:   Patient seen on rounds he is alert and oriented to person place time situation and day, he is a poor historian and difficult to discern exact symptomatology because he tends to make joking and flippant answers, interspersed with probably true answers.  He gives me a variable report as to how much he has been drinking.  He continues to tell the same unusual story that he learned his girlfriend was actually married and that he took some Vicodin in an overdose attempt.   He denies current suicidal thoughts plans or intent and is focused on going to some long-term rehab at [facility] He presented with a blood alcohol level of 161 on 7/24, and on this admission his blood alcohol level was 139, and during the beginning of the interview tells me he is a daily drinker and has withdrawal symptoms if he does not drink at the end of the interview denies that he is a daily drinker and states he does not have seizures or withdrawals but again his history is variable.   Principal Problem: Adjustment disorder with mixed disturbance of emotions and conduct Diagnosis: Principal Problem:   Adjustment disorder with mixed disturbance of emotions and conduct Active Problems:   HTN (hypertension)   Alcohol use disorder, severe, dependence ([place])  Total Time spent with patient: 20 minutes  Past Psychiatric History: Chart indicates numerous encounters chronic suicidal threats  Past Medical History:  Past Medical History:  Diagnosis Date  . Alcohol abuse   . Bronchitis   . Depression   . Diabetes mellitus without complication ([place])    lost weight diet mgmt at present  . Drug abuse ([place])   . Family history of adverse reaction to anesthesia   . GERD (gastroesophageal reflux disease)   . Gout   . Hypertension   . Morbid obesity ([place])   . Suicide ideation     Past Surgical History:  Procedure Laterality Date  . none    .  TRICEPS TENDON REPAIR Right 09/26/2018   Procedure: Debridement and primary repair of partial-thickness right distal triceps tendon tear. ;  Surgeon: Corky Mull, MD;  Location: [facility] ORS;  Service: Orthopedics;  Laterality: Right;  . UPPER GASTROINTESTINAL ENDOSCOPY     Family History:  Family History  Problem Relation Age of Onset  . Hypertension Other   . Diabetes Mellitus II Other    Family Psychiatric  History: Patient denies Social History:  Social History   Substance and Sexual Activity  Alcohol Use Yes  . Alcohol/week: 0.0 standard drinks   Comment: 14 beers today     Social History   Substance and Sexual Activity  Drug Use Yes  . Frequency: 1.0 times per week  . Types: Marijuana, Cocaine   Comment: last used cocaine Monday; last smoked marijuana monday    Social History   Socioeconomic History  . Marital status: Divorced    Spouse name: Not on file  . Number of children: Not on file  . Years of education: Not on file  . Highest education level: Not on file  Occupational History  . Not on file  Social Needs  . Financial resource strain: Not on file  . Food insecurity    Worry: Not on file    Inability: Not on file  . Transportation needs    Medical: Not on file    Non-medical: Not on file  Tobacco Use  .  Smoking status: Current Every Day Smoker    Packs/day: 1.00    Types: Cigarettes  . Smokeless tobacco: Current User    Types: Snuff  Substance and Sexual Activity  . Alcohol use: Yes    Alcohol/week: 0.0 standard drinks    Comment: 14 beers today  . Drug use: Yes    Frequency: 1.0 times per week    Types: Marijuana, Cocaine    Comment: last used cocaine Monday; last smoked marijuana monday  . Sexual activity: Not on file  Lifestyle  . Physical activity    Days per week: Not on file    Minutes per session: Not on file  . Stress: Not on file  Relationships  . Social Herbalist on phone: Not on file    Gets together: Not on file     Attends religious service: Not on file    Active member of club or organization: Not on file    Attends meetings of clubs or organizations: Not on file    Relationship status: Not on file  Other Topics Concern  . Not on file  Social History Narrative  . Not on file   Additional Social History:                         Sleep: Fair  Appetite:  Fair  Current Medications: Current Facility-Administered Medications  Medication Dose Route Frequency Provider Last Rate Last Dose  . acetaminophen (TYLENOL) tablet 650 mg  650 mg Oral Q6H PRN Money, [name], [name]      . albuterol (PROVENTIL) (2.5 MG/3ML) 0.083% nebulizer solution 2.5 mg  2.5 mg Inhalation Q6H PRN Clapacs, John T, MD      . alum & mag hydroxide-simeth (MAALOX/MYLANTA) 200-200-20 MG/5ML suspension 30 mL  30 mL Oral Q4H PRN Money, [name], [name]      . gabapentin (NEURONTIN) capsule 300 mg  300 mg Oral TID Johnn Hai, MD      . hydrOXYzine (ATARAX/VISTARIL) tablet 25 mg  25 mg Oral TID PRN Money, [name], [name]   25 mg at 11/16/18 1816  . magnesium hydroxide (MILK OF MAGNESIA) suspension 30 mL  30 mL Oral Daily PRN Money, [name], [name]      . [START ON 11/19/2018] metoprolol succinate (TOPROL-XL) 24 hr tablet 50 mg  50 mg Oral Daily Johnn Hai, MD      . NIFEdipine (ADALAT CC) 24 hr tablet 60 mg  60 mg Oral Daily Clapacs, Madie Reno, MD   60 mg at 11/18/18 0630  . pantoprazole (PROTONIX) EC tablet 40 mg  40 mg Oral Daily Clapacs, Madie Reno, MD   40 mg at 11/17/18 0848  . PHENobarbital (LUMINAL) tablet 32.4 mg  32.4 mg Oral BID Johnn Hai, MD      . traZODone (DESYREL) tablet 50 mg  50 mg Oral QHS PRN Money, [name], [name]      . vitamin B-12 (CYANOCOBALAMIN) tablet 1,000 mcg  1,000 mcg Oral Daily Clapacs, Madie Reno, MD   1,000 mcg at 11/17/18 0848    Lab Results:  Results for orders placed or performed during the hospital encounter of 11/15/18 (from the past 48 hour(s))  HIV Antibody (routine testing w rflx)     Status: None    Collection Time: 11/17/18  9:32 AM  Result Value Ref Range   HIV Screen 4th Generation wRfx Non Reactive Non Reactive    Comment: (NOTE) Performed At: [facility] [place], [place] [id number] Rush Farmer MD [id number]     Blood Alcohol level:  Lab Results  Component Value Date   ETH 139 (H) 11/15/2018   ETH 244 (H) 99991111    Metabolic Disorder Labs: Lab Results  Component Value Date   HGBA1C 6.0 06/14/2016   MPG 126 01/03/2016   No results found for: PROLACTIN Lab Results  Component Value Date   CHOL 121 04/20/2016   TRIG 88 04/20/2016   HDL 38 (L) 04/20/2016   CHOLHDL 3.2 04/20/2016   VLDL 18 04/20/2016   LDLCALC 65 04/20/2016   LDLCALC 77 01/03/2016    Physical Findings: AIMS: Facial and Oral Movements Muscles of Facial Expression: None, normal Jaw: None, normal Tongue: None, normal,Extremity Movements Upper (arms, wrists, hands, fingers): None, normal Lower (legs, knees, ankles, toes): None, normal, Trunk Movements Neck, shoulders, hips: None, normal, Overall Severity Severity of abnormal movements (highest score from questions above): None, normal Incapacitation due to abnormal movements: None, normal Patient's awareness of abnormal movements (rate only patient's report): No Awareness, Dental Status Current problems with teeth and/or dentures?: No Does patient usually wear dentures?: No  CIWA:  CIWA-Ar Total: 2 COWS:  COWS Total Score: 4  Musculoskeletal: Strength & Muscle Tone: within normal limits Gait & Station: normal Patient leans: N/A  Psychiatric Specialty Exam: Physical Exam  ROS  Blood pressure (!) 138/99, pulse 100, temperature 98.2 F (36.8 C), temperature source Oral, resp. rate 18, height 6' (1.829 m), weight 131.5 kg, SpO2 100 %.Body mass index is 39.33 kg/m.  General Appearance: Disheveled  Eye Contact:  Fair  Speech:  Clear and Coherent  Volume:  Increased  Mood:  Often jovial without  hypomania/somewhat flippant regarding answers during the interview but overall cordial  Affect:  Full Range  Thought Process:  Irrelevant and Descriptions of Associations: Circumstantial  Orientation:  Full (Time, Place, and Person)  Thought Content:  Illogical and Tangential  Suicidal Thoughts:  No  Homicidal Thoughts:  No  Memory:  Immediate;   Poor Recent;   Poor Remote;   Fair  Judgement:  Fair  Insight:  Shallow  Psychomotor Activity:  Normal  Concentration:  Concentration: Fair and Attention Span: Fair  Recall:  [employer] of Knowledge:  Fair  Language:  Fair  Akathisia:  Negative  Handed:  Right  AIMS (if indicated):     Assets:  Social Support  ADL's:  Intact  Cognition:  WNL  Sleep:  Number of Hours: 7.75     Treatment Plan Summary: Daily contact with patient to assess and evaluate symptoms and progress in treatment and Medication management  Because it is unclear if he has true alcohol dependency or just abuse, it would be wise to go ahead and use a brief detox regimen, since he is couple days into his admission we will go ahead and use a phenobarbital regimen of course off label but the longer half-life may be protective.  Add Neurontin as well for alcoholism.  Continue to engage in cognitive and reality-based therapies.  Continue current precautions.  Hopefully he will obtain longer term rehab.  Johnn Hai, MD 11/18/2018, 8:42 AM

## 2018-11-19 MED ORDER — RISPERIDONE 1 MG PO TABS
2.0000 mg | ORAL_TABLET | Freq: Two times a day (BID) | ORAL | Status: DC
  Administered 2018-11-19 – 2018-11-21 (×5): 2 mg via ORAL
  Filled 2018-11-19 (×5): qty 2

## 2018-11-19 NOTE — [facility] Group Notes (Signed)
LCSW Aftercare Discharge Planning Group Note   11/19/2018 1315  Type of Group and Topic: Psychoeducational Group:  Discharge Planning  Participation Level:  Active  Description of Group  Discharge planning group reviews patient's anticipated discharge plans and assists patients to anticipate and address any barriers to wellness/recovery in the community.  Suicide prevention education is reviewed with patients in group.  Therapeutic Goals 1. Patients will state their anticipated discharge plan and mental health aftercare 2. Patients will identify potential barriers to wellness in the community setting 3. Patients will engage in problem solving, solution focused discussion of ways to anticipate and address barriers to wellness/recovery  Summary of Patient Progress: pt overly talkative and had to be redirected from side conversations, but quite good natured and also made good comments during discussion.  Pt did well during wellness discussion and read several descriptions from the handoff.     Plan for Discharge/Comments:  Pursuing [facility] referral  Transportation Means:   Supports:  Therapeutic Modalities: Motivational Interviewing    Joanne Chars, LCSW 11/19/2018 2:23 PM

## 2018-11-19 NOTE — Plan of Care (Signed)
Active in the milieu, cooperative and compliant with treatment. Reporting that he will not return to his mother's home, that he needs help finding alternative housing.

## 2018-11-19 NOTE — Progress Notes (Signed)
[facility] MD Progress Note  11/19/2018 9:55 AM Alan Eaton  MRN:  [id number] Subjective:   Patient remains rather hypomanic and intrusive, beginning rounds by telling me which of the other patients need to be discharged in which need to stay here, inserting himself into the affairs of others.  His encouraged worry about his own case solely.  He denies cravings tremors or withdrawal symptoms denies thoughts of harming self again tends to give some flip answers so it is hard to gauge but no acute psychosis discerned No involuntary movements no tremors  Principal Problem: Adjustment disorder with mixed disturbance of emotions and conduct Diagnosis: Principal Problem:   Adjustment disorder with mixed disturbance of emotions and conduct Active Problems:   HTN (hypertension)   Alcohol use disorder, severe, dependence ([employer])  Total Time spent with patient: 20 minutes  Past Psychiatric History: EXST  Past Medical History:  Past Medical History:  Diagnosis Date  . Alcohol abuse   . Bronchitis   . Depression   . Diabetes mellitus without complication ([place])    lost weight diet mgmt at present  . Drug abuse ([place])   . Family history of adverse reaction to anesthesia   . GERD (gastroesophageal reflux disease)   . Gout   . Hypertension   . Morbid obesity ([place])   . Suicide ideation     Past Surgical History:  Procedure Laterality Date  . none    . TRICEPS TENDON REPAIR Right 09/26/2018   Procedure: Debridement and primary repair of partial-thickness right distal triceps tendon tear. ;  Surgeon: Corky Mull, MD;  Location: [facility] OR[facility];  Service: Orthopedics;  Laterality: Right;  . UPPER GASTROINTESTINAL ENDOSCOPY     Family History:  Family History  Problem Relation Age of Onset  . Hypertension Other   . Diabetes Mellitus II Other    Family Psychiatric  History: no new data Social History:  Social History   Substance and Sexual Activity  Alcohol Use Yes  . Alcohol/week: 0.0 standard  drinks   Comment: 14 beers today     Social History   Substance and Sexual Activity  Drug Use Yes  . Frequency: 1.0 times per week  . Types: Marijuana, Cocaine   Comment: last used cocaine Monday; last smoked marijuana monday    Social History   Socioeconomic History  . Marital status: Divorced    Spouse name: Not on file  . Number of children: Not on file  . Years of education: Not on file  . Highest education level: Not on file  Occupational History  . Not on file  Social Needs  . Financial resource strain: Not on file  . Food insecurity    Worry: Not on file    Inability: Not on file  . Transportation needs    Medical: Not on file    Non-medical: Not on file  Tobacco Use  . Smoking status: Current Every Day Smoker    Packs/day: 1.00    Types: Cigarettes  . Smokeless tobacco: Current User    Types: Snuff  Substance and Sexual Activity  . Alcohol use: Yes    Alcohol/week: 0.0 standard drinks    Comment: 14 beers today  . Drug use: Yes    Frequency: 1.0 times per week    Types: Marijuana, Cocaine    Comment: last used cocaine Monday; last smoked marijuana monday  . Sexual activity: Not on file  Lifestyle  . Physical activity    Days per week: Not on  file    Minutes per session: Not on file  . Stress: Not on file  Relationships  . Social Herbalist on phone: Not on file    Gets together: Not on file    Attends religious service: Not on file    Active member of club or organization: Not on file    Attends meetings of clubs or organizations: Not on file    Relationship status: Not on file  Other Topics Concern  . Not on file  Social History Narrative  . Not on file   Additional Social History:                         Sleep: Good  Appetite:  Good  Current Medications: Current Facility-Administered Medications  Medication Dose Route Frequency Provider Last Rate Last Dose  . acetaminophen (TYLENOL) tablet 650 mg  650 mg Oral Q6H  PRN Money, [name], [name]   650 mg at 11/18/18 1557  . albuterol (PROVENTIL) (2.5 MG/3ML) 0.083% nebulizer solution 2.5 mg  2.5 mg Inhalation Q6H PRN Clapacs, John T, MD      . alum & mag hydroxide-simeth (MAALOX/MYLANTA) 200-200-20 MG/5ML suspension 30 mL  30 mL Oral Q4H PRN Money, Darnelle Maffucci B, [name]      . gabapentin (NEURONTIN) capsule 300 mg  300 mg Oral TID Johnn Hai, MD   300 mg at 11/19/18 0815  . hydrOXYzine (ATARAX/VISTARIL) tablet 25 mg  25 mg Oral TID PRN Money, [name], [name]   25 mg at 11/16/18 1816  . magnesium hydroxide (MILK OF MAGNESIA) suspension 30 mL  30 mL Oral Daily PRN Money, [name], [name]      . metoprolol succinate (TOPROL-XL) 24 hr tablet 50 mg  50 mg Oral Daily Johnn Hai, MD   50 mg at 11/19/18 0815  . NIFEdipine (ADALAT CC) 24 hr tablet 60 mg  60 mg Oral Daily Clapacs, Madie Reno, MD   60 mg at 11/19/18 0816  . pantoprazole (PROTONIX) EC tablet 40 mg  40 mg Oral Daily Clapacs, Madie Reno, MD   40 mg at 11/19/18 0814  . PHENobarbital (LUMINAL) tablet 32.4 mg  32.4 mg Oral BID Johnn Hai, MD   32.4 mg at 11/19/18 [id number]  . risperiDONE (RISPERDAL) tablet 2 mg  2 mg Oral BID Johnn Hai, MD      . traZODone (DESYREL) tablet 50 mg  50 mg Oral QHS PRN Money, [name], [name]   50 mg at 11/18/18 2120  . vitamin B-12 (CYANOCOBALAMIN) tablet 1,000 mcg  1,000 mcg Oral Daily Clapacs, John T, MD   1,000 mcg at 11/19/18 0815    Lab Results: No results found for this or any previous visit (from the past 48 hour(s)).  Blood Alcohol level:  Lab Results  Component Value Date   ETH 139 (H) 11/15/2018   ETH 244 (H) 99991111    Metabolic Disorder Labs: Lab Results  Component Value Date   HGBA1C 6.0 06/14/2016   MPG 126 01/03/2016   No results found for: PROLACTIN Lab Results  Component Value Date   CHOL 121 04/20/2016   TRIG 88 04/20/2016   HDL 38 (L) 04/20/2016   CHOLHDL 3.2 04/20/2016   VLDL 18 04/20/2016   LDLCALC 65 04/20/2016   LDLCALC 77 01/03/2016    Physical  Findings: AIMS: Facial and Oral Movements Muscles of Facial Expression: None, normal Jaw: None, normal Tongue: None, normal,Extremity Movements Upper (arms, wrists, hands,  fingers): None, normal Lower (legs, knees, ankles, toes): None, normal, Trunk Movements Neck, shoulders, hips: None, normal, Overall Severity Severity of abnormal movements (highest score from questions above): None, normal Incapacitation due to abnormal movements: None, normal Patient's awareness of abnormal movements (rate only patient's report): No Awareness, Dental Status Current problems with teeth and/or dentures?: No Does patient usually wear dentures?: No  CIWA:  CIWA-Ar Total: 2 COWS:  COWS Total Score: 4  Musculoskeletal: Strength & Muscle Tone: within normal limits Gait & Station: normal Patient leans: N/A  Psychiatric Specialty Exam: Physical Exam  ROS  Blood pressure (!) 142/90, pulse 78, temperature (!) 97.5 F (36.4 C), temperature source Oral, resp. rate 18, height 6' (1.829 m), weight 131.5 kg, SpO2 96 %.Body mass index is 39.33 kg/m.  General Appearance: Disheveled  Eye Contact:  Fair  Speech:  Clear and Coherent and Pressured  Volume:  Increased  Mood:  Hypomanic  Affect:  Congruent  Thought Process:  Linear and Descriptions of Associations: Circumstantial  Orientation:  Full (Time, Place, and Person)  Thought Content:  Tangential  Suicidal Thoughts:  No  Homicidal Thoughts:  No  Memory:  Immediate;   Fair Recent;   Good  Judgement:  Fair  Insight:  Fair  Psychomotor Activity:  Normal  Concentration:  Concentration: Fair  Recall:  [employer] of Knowledge:  Fair  Language:  Fair  Akathisia:  Negative  Handed:  Right  AIMS (if indicated):     Assets:  Communication Skills Desire for Improvement Leisure Time Physical Health Resilience  ADL's:  Intact  Cognition:  WNL  Sleep:  Number of Hours: 5.75     Treatment Plan Summary: Daily contact with patient to assess and  evaluate symptoms and progress in treatment and Medication management  Add Risperdal low-dose for hypomania continue current measures for blood pressure, continue the last few doses of phenobarbital for protracted withdrawal from alcohol continue to engage in cognitive and rehab based therapies no change in precautions  Symia Herdt, MD 11/19/2018, 9:55 AM

## 2018-11-19 NOTE — Progress Notes (Signed)
Patient's mother called to request patient's discharge. Reported that patient is scheduled for a monthly injection tomorrow and mother would like to pick him up up and take him to his outpatient provider for the shot. However, patient continues to report that he does not want to return to his mother's, that she is over controlling "she tells me what to do..I am 43..ibuprofen am not a little boy...". He can be intrusive and demanding.

## 2018-11-20 NOTE — Progress Notes (Signed)
CSW spoke with [facility] regarding pt referral. Candy reported she received the referral and it is waiting to be reviewed by the psychiatrist.   Evalina Field, MSW, LCSW Clinical Social Work 11/20/2018 9:04 AM

## 2018-11-20 NOTE — Plan of Care (Signed)
Patient is pleasant and cooperative in the unit.Denies SI,HI and AVH.Compliant with medications.Attended groups.Appetite and energy level good.Patient was sad this morning because of his cousin's brain death.Support and encouragement given.

## 2018-11-20 NOTE — Progress Notes (Signed)
[facility] MD Progress Note  11/20/2018 1:41 PM Alan Eaton  MRN:  [id number] Subjective: Patient has no acute symptoms.  Denies suicidal or homicidal ideation.  Physically stable.  He still thinks that he would prefer to go to the alcohol and drug abuse treatment center saying that he does not like being around his mother.  Mother has called today and is concerned that he is due to get his Vivitrol shot.  Vital stable.  Behavior stable. Principal Problem: Adjustment disorder with mixed disturbance of emotions and conduct Diagnosis: Principal Problem:   Adjustment disorder with mixed disturbance of emotions and conduct Active Problems:   HTN (hypertension)   Alcohol use disorder, severe, dependence (HCC)  Total Time spent with patient: 20 minutes  Past Psychiatric History: Past history of alcohol abuse mood instability  Past Medical History:  Past Medical History:  Diagnosis Date  . Alcohol abuse   . Bronchitis   . Depression   . Diabetes mellitus without complication ([place])    lost weight diet mgmt at present  . Drug abuse ([place])   . Family history of adverse reaction to anesthesia   . GERD (gastroesophageal reflux disease)   . Gout   . Hypertension   . Morbid obesity ([place])   . Suicide ideation     Past Surgical History:  Procedure Laterality Date  . none    . TRICEPS TENDON REPAIR Right 09/26/2018   Procedure: Debridement and primary repair of partial-thickness right distal triceps tendon tear. ;  Surgeon: Corky Mull, MD;  Location: [facility] ORS;  Service: Orthopedics;  Laterality: Right;  . UPPER GASTROINTESTINAL ENDOSCOPY     Family History:  Family History  Problem Relation Age of Onset  . Hypertension Other   . Diabetes Mellitus II Other    Family Psychiatric  History: See previous Social History:  Social History   Substance and Sexual Activity  Alcohol Use Yes  . Alcohol/week: 0.0 standard drinks   Comment: 14 beers today     Social History   Substance and Sexual  Activity  Drug Use Yes  . Frequency: 1.0 times per week  . Types: Marijuana, Cocaine   Comment: last used cocaine Monday; last smoked marijuana monday    Social History   Socioeconomic History  . Marital status: Divorced    Spouse name: Not on file  . Number of children: Not on file  . Years of education: Not on file  . Highest education level: Not on file  Occupational History  . Not on file  Social Needs  . Financial resource strain: Not on file  . Food insecurity    Worry: Not on file    Inability: Not on file  . Transportation needs    Medical: Not on file    Non-medical: Not on file  Tobacco Use  . Smoking status: Current Every Day Smoker    Packs/day: 1.00    Types: Cigarettes  . Smokeless tobacco: Current User    Types: Snuff  Substance and Sexual Activity  . Alcohol use: Yes    Alcohol/week: 0.0 standard drinks    Comment: 14 beers today  . Drug use: Yes    Frequency: 1.0 times per week    Types: Marijuana, Cocaine    Comment: last used cocaine Monday; last smoked marijuana monday  . Sexual activity: Not on file  Lifestyle  . Physical activity    Days per week: Not on file    Minutes per session: Not on file  .  Stress: Not on file  Relationships  . Social Herbalist on phone: Not on file    Gets together: Not on file    Attends religious service: Not on file    Active member of club or organization: Not on file    Attends meetings of clubs or organizations: Not on file    Relationship status: Not on file  Other Topics Concern  . Not on file  Social History Narrative  . Not on file   Additional Social History:                         Sleep: Fair  Appetite:  Fair  Current Medications: Current Facility-Administered Medications  Medication Dose Route Frequency Provider Last Rate Last Dose  . acetaminophen (TYLENOL) tablet 650 mg  650 mg Oral Q6H PRN Money, [name], [name]   650 mg at 11/18/18 1557  . albuterol (PROVENTIL) (2.5  MG/3ML) 0.083% nebulizer solution 2.5 mg  2.5 mg Inhalation Q6H PRN ,  T, MD      . alum & mag hydroxide-simeth (MAALOX/MYLANTA) 200-200-20 MG/5ML suspension 30 mL  30 mL Oral Q4H PRN Money, Darnelle Maffucci B, [name]      . gabapentin (NEURONTIN) capsule 300 mg  300 mg Oral TID n Hai, MD   300 mg at 11/20/18 1207  . hydrOXYzine (ATARAX/VISTARIL) tablet 25 mg  25 mg Oral TID PRN Money, [name], [name]   25 mg at 11/16/18 1816  . magnesium hydroxide (MILK OF MAGNESIA) suspension 30 mL  30 mL Oral Daily PRN Money, [name], [name]      . metoprolol succinate (TOPROL-XL) 24 hr tablet 50 mg  50 mg Oral Daily n Hai, MD   50 mg at 11/20/18 0855  . NIFEdipine (ADALAT CC) 24 hr tablet 60 mg  60 mg Oral Daily , Madie Reno, MD   60 mg at 11/20/18 0857  . pantoprazole (PROTONIX) EC tablet 40 mg  40 mg Oral Daily , Madie Reno, MD   40 mg at 11/20/18 0857  . risperiDONE (RISPERDAL) tablet 2 mg  2 mg Oral BID n Hai, MD   2 mg at 11/20/18 0857  . traZODone (DESYREL) tablet 50 mg  50 mg Oral QHS PRN Money, [name], [name]   50 mg at 11/18/18 2120  . vitamin B-12 (CYANOCOBALAMIN) tablet 1,000 mcg  1,000 mcg Oral Daily , Madie Reno, MD   1,000 mcg at 11/20/18 [id number]    Lab Results: No results found for this or any previous visit (from the past 48 hour(s)).  Blood Alcohol level:  Lab Results  Component Value Date   ETH 139 (H) 11/15/2018   ETH 244 (H) 99991111    Metabolic Disorder Labs: Lab Results  Component Value Date   HGBA1C 6.0 06/14/2016   MPG 126 01/03/2016   No results found for: PROLACTIN Lab Results  Component Value Date   CHOL 121 04/20/2016   TRIG 88 04/20/2016   HDL 38 (L) 04/20/2016   CHOLHDL 3.2 04/20/2016   VLDL 18 04/20/2016   LDLCALC 65 04/20/2016   LDLCALC 77 01/03/2016    Physical Findings: AIMS: Facial and Oral Movements Muscles of Facial Expression: None, normal Jaw: None, normal Tongue: None, normal,Extremity Movements Upper (arms, wrists, hands,  fingers): None, normal Lower (legs, knees, ankles, toes): None, normal, Trunk Movements Neck, shoulders, hips: None, normal, Overall Severity Severity of abnormal movements (highest score from questions above): None, normal Incapacitation  due to abnormal movements: None, normal Patient's awareness of abnormal movements (rate only patient's report): No Awareness, Dental Status Current problems with teeth and/or dentures?: No Does patient usually wear dentures?: No  CIWA:  CIWA-Ar Total: 2 COWS:  COWS Total Score: 4  Musculoskeletal: Strength & Muscle Tone: within normal limits Gait & Station: normal Patient leans: N/A  Psychiatric Specialty Exam: Physical Exam  Nursing note and vitals reviewed. Constitutional: He appears well-developed and well-nourished.  HENT:  Head: Normocephalic and atraumatic.  Eyes: Pupils are equal, round, and reactive to light. Conjunctivae are normal.  Neck: Normal range of motion.  Cardiovascular: Regular rhythm and normal heart sounds.  Respiratory: Effort normal. No respiratory distress.  GI: Soft.  Musculoskeletal: Normal range of motion.  Neurological: He is alert.  Skin: Skin is warm and dry.  Psychiatric: He has a normal mood and affect. His behavior is normal. Judgment and thought content normal.    Review of Systems  Constitutional: Negative.   HENT: Negative.   Eyes: Negative.   Respiratory: Negative.   Cardiovascular: Negative.   Gastrointestinal: Negative.   Musculoskeletal: Negative.   Skin: Negative.   Neurological: Negative.   Psychiatric/Behavioral: Negative.     Blood pressure (!) 156/107, pulse 89, temperature (!) 97.4 F (36.3 C), temperature source Oral, resp. rate 16, height 6' (1.829 m), weight 131.5 kg, SpO2 96 %.Body mass index is 39.33 kg/m.  General Appearance: Casual  Eye Contact:  Fair  Speech:  Clear and Coherent  Volume:  Normal  Mood:  Euthymic  Affect:  Congruent  Thought Process:  Goal Directed   Orientation:  Full (Time, Place, and Person)  Thought Content:  Logical  Suicidal Thoughts:  No  Homicidal Thoughts:  No  Memory:  Immediate;   Fair Recent;   Fair Remote;   Fair  Judgement:  Fair  Insight:  Fair  Psychomotor Activity:  Decreased  Concentration:  Concentration: Fair  Recall:  [employer] of Knowledge:  Fair  Language:  Fair  Akathisia:  No  Handed:  Right  AIMS (if indicated):     Assets:  Desire for Improvement Social Support  ADL's:  Intact  Cognition:  WNL  Sleep:  Number of Hours: 6.25     Treatment Plan Summary: Daily contact with patient to assess and evaluate symptoms and progress in treatment, Medication management and Plan No change to medication management.  We will try to find out as soon as possible whether alcohol and drug abuse treatment center would be willing to accept him.  I think we will plan for likely discharge tomorrow 1 way or the other.  He understands the plan.  Alethia Berthold, MD 11/20/2018, 1:41 PM

## 2018-11-20 NOTE — Progress Notes (Signed)
Patient remains  optimistic and cheerful , socializing with peers but intrusive  into peers business to become his, patient is complying with therapeutic regimen and maintaining safety in the unit, sleep,is adequate and good appetite , mood is overly excited with by him self, self coping mechanism is encouraged, patient has no complains other than being discharged from here, denies any SI/HI/AVH no signs of delusions noted patient only  Requiring 15 minutes safety checks, no distress.

## 2018-11-20 NOTE — Progress Notes (Signed)
Recreation Therapy Notes   Date: 11/20/2018  Time: 9:30 am  Location: Craft room  Behavioral response: Appropriate   Intervention Topic: Relaxation   Discussion/Intervention:  Group content today was focused on relaxation. The group defined relaxation and identified healthy ways to relax. Individuals expressed how much time they spend relaxing. Patients expressed how much their life would be if they did not make time for themselves to relax. The group stated ways they could improve their relaxation techniques in the future.  Individuals participated in the intervention "Time to Relax" where they had a chance to experience different relaxation techniques.  Clinical Observations/Feedback:  Patient came to group and stated that he listens to music, colors and fishes to relax.  Individual was social with peers and staff while participating in the intervention.    Alan Eaton LRT/CTRS         Melvin Marmo 11/20/2018 11:13 AM

## 2018-11-20 NOTE — [facility] Group Notes (Signed)
Overcoming Obstacles  11/20/2018 1PM  Type of Therapy and Topic:  Group Therapy:  Overcoming Obstacles  Participation Level:  Minimal    Description of Group:    In this group patients will be encouraged to explore what they see as obstacles to their own wellness and recovery. They will be guided to discuss their thoughts, feelings, and behaviors related to these obstacles. The group will process together ways to cope with barriers, with attention given to specific choices patients can make. Each patient will be challenged to identify changes they are motivated to make in order to overcome their obstacles. This group will be process-oriented, with patients participating in exploration of their own experiences as well as giving and receiving support and challenge from other group members.   Therapeutic Goals: 1. Patient will identify personal and current obstacles as they relate to admission. 2. Patient will identify barriers that currently interfere with their wellness or overcoming obstacles.  3. Patient will identify feelings, thought process and behaviors related to these barriers. 4. Patient will identify two changes they are willing to make to overcome these obstacles:      Summary of Patient Progress  Pt states he does not have an obstacle that he would like to overcome. Pt did offer support and feedback to group members who discussed the challenges they are trying to overcome. Pt respected boundaries during session.   Therapeutic Modalities:   Cognitive Behavioral Therapy Solution Focused Therapy Motivational Interviewing Relapse Prevention Therapy    Sanjuana Kava, MSW, LCSW 11/20/2018 1:53 PM

## 2018-11-21 MED ORDER — ALBUTEROL SULFATE HFA 108 (90 BASE) MCG/ACT IN AERS: 2.0000 | INHALATION_SPRAY | RESPIRATORY_TRACT | 1 refills | Status: AC | PRN

## 2018-11-21 MED ORDER — GABAPENTIN 300 MG PO CAPS: 300.0000 mg | ORAL_CAPSULE | Freq: Three times a day (TID) | ORAL | 1 refills | Status: DC

## 2018-11-21 MED ORDER — NIFEDIPINE ER OSMOTIC RELEASE 60 MG PO TB24: 60.0000 mg | ORAL_TABLET | Freq: Every day | ORAL | 1 refills | Status: AC

## 2018-11-21 MED ORDER — PANTOPRAZOLE SODIUM 40 MG PO TBEC: 40.0000 mg | DELAYED_RELEASE_TABLET | Freq: Every day | ORAL | 1 refills | Status: DC

## 2018-11-21 MED ORDER — METOPROLOL SUCCINATE ER 50 MG PO TB24: 50.0000 mg | ORAL_TABLET | Freq: Every day | ORAL | 1 refills | Status: DC

## 2018-11-21 MED ORDER — TRAZODONE HCL 50 MG PO TABS: 50.0000 mg | ORAL_TABLET | Freq: Every evening | ORAL | 1 refills | Status: DC | PRN

## 2018-11-21 MED ORDER — VITAMIN B-12 1000 MCG PO TABS: 1000.0000 ug | ORAL_TABLET | Freq: Every day | ORAL | 1 refills | Status: AC

## 2018-11-21 NOTE — Progress Notes (Signed)
Recreation Therapy Notes    Date: 11/21/2018  Time: 9:30 am   Location: Craft room   Behavioral response: N/A   Intervention Topic: Necessities    Discussion/Intervention: Patient did not attend group.   Clinical Observations/Feedback:  Patient did not attend group.   Deunta Beneke LRT/CTRS        Kimiah Hibner 11/21/2018 11:13 AM

## 2018-11-21 NOTE — [facility] Group Notes (Signed)
  LCSW Group Therapy Note  11/21/2018 12:17 PM   Type of Therapy/Topic:  Group Therapy:  Feelings about Diagnosis  Participation Level:  Minimal   Description of Group:   This group will allow patients to explore their thoughts and feelings about diagnoses they have received. Patients will be guided to explore their level of understanding and acceptance of these diagnoses. Facilitator will encourage patients to process their thoughts and feelings about the reactions of others to their diagnosis and will guide patients in identifying ways to discuss their diagnosis with significant others in their lives. This group will be process-oriented, with patients participating in exploration of their own experiences, giving and receiving support, and processing challenge from other group members.   Therapeutic Goals: 1. Patient will demonstrate understanding of diagnosis as evidenced by identifying two or more symptoms of the disorder 2. Patient will be able to express two feelings regarding the diagnosis 3. Patient will demonstrate their ability to communicate their needs through discussion and/or role play  Summary of Patient Progress: Pt was present in group. Pt participated when identifying coping skills reporting he likes to listen to music and karaoke. Pt reported he has a great support system and his friends and family try to help him all the time.   Therapeutic Modalities:   Cognitive Behavioral Therapy Brief Therapy Feelings Identification    Evalina Field, MSW, LCSW Clinical Social Work 11/21/2018 12:17 PM

## 2018-11-21 NOTE — Plan of Care (Signed)
  Problem: Education: Goal: Ability to make informed decisions regarding treatment will improve Outcome: Progressing  Patient can make informed decisions regarding his treatment plan.

## 2018-11-21 NOTE — Progress Notes (Signed)
Pt has been accepted at [facility] and is awaiting a bed date as they currently have no beds available.   Evalina Field, MSW, LCSW Clinical Social Work 11/21/2018 10:01 AM

## 2018-11-21 NOTE — Progress Notes (Signed)
Patient alert and oriented x 4, affect is blunted, mood is pleasant and receptive to staff, he appears less anxious, denies SI/HI/AVH interacting appropriately with peers and staff, compliant with medication regimen. 15 minutes safety checks maintained will continue to monitor

## 2018-11-21 NOTE — [facility] Suicide Risk Assessment (Signed)
[facility] Discharge Suicide Risk Assessment   Principal Problem: Adjustment disorder with mixed disturbance of emotions and conduct Discharge Diagnoses: Principal Problem:   Adjustment disorder with mixed disturbance of emotions and conduct Active Problems:   HTN (hypertension)   Alcohol use disorder, severe, dependence ([place])   Total Time spent with patient: 30 minutes  Musculoskeletal: Strength & Muscle Tone: within normal limits Gait & Station: normal Patient leans: N/A  Psychiatric Specialty Exam: Review of Systems  Constitutional: Negative.   HENT: Negative.   Eyes: Negative.   Respiratory: Negative.   Cardiovascular: Negative.   Gastrointestinal: Negative.   Musculoskeletal: Negative.   Skin: Negative.   Neurological: Negative.   Psychiatric/Behavioral: Negative.     Blood pressure 135/84, pulse 87, temperature 98.6 F (37 C), temperature source Oral, resp. rate 18, height 6' (1.829 m), weight 131.5 kg, SpO2 97 %.Body mass index is 39.33 kg/m.  General Appearance: Casual  Eye Contact::  Fair  Speech:  Clear and [id number]  Volume:  Normal  Mood:  Euthymic  Affect:  Congruent  Thought Process:  Goal Directed  Orientation:  Full (Time, Place, and Person)  Thought Content:  Logical  Suicidal Thoughts:  No  Homicidal Thoughts:  No  Memory:  Immediate;   Fair Recent;   Poor Remote;   Fair  Judgement:  Fair  Insight:  Fair  Psychomotor Activity:  Normal  Concentration:  Fair  Recall:  [employer] of Knowledge:Fair  Language: Fair  Akathisia:  No  Handed:  Right  AIMS (if indicated):     Assets:  Desire for Improvement Social Support  Sleep:  Number of Hours: 7.15  Cognition: WNL  ADL's:  Intact   Mental Status Per Nursing Assessment::   On Admission:  NA  Demographic Factors:  Male  Loss Factors: NA  Historical Factors: Impulsivity  Risk Reduction Factors:   Living with another person, especially a relative and Positive social support  Continued  Clinical Symptoms:  Depression:   Impulsivity Alcohol/Substance Abuse/Dependencies Personality Disorders:   Cluster B  Cognitive Features That Contribute To Risk:  None    Suicide Risk:  Minimal: No identifiable suicidal ideation.  Patients presenting with no risk factors but with morbid ruminations; may be classified as minimal risk based on the severity of the depressive symptoms  [facility] Follow up.   Contact information: [place] [facility] [id number] [phone]           Plan Of Care/Follow-up recommendations:  Activity:  Activity as tolerated Diet:  Regular diet Other:  Follow-up with outpatient mental health and substance abuse services.  Follow-up with the alcohol and drug abuse treatment center on your own by keeping up with when a bed is available  Alethia Berthold, MD 11/21/2018, 4:17 PM

## 2018-11-21 NOTE — Progress Notes (Signed)
Pt has not been as social today. He has had a flat affect and more irritable. Collier Bullock RN

## 2018-11-21 NOTE — Progress Notes (Signed)
[facility] MD Progress Note  11/21/2018 4:14 PM Alan Eaton  MRN:  [id number] Subjective: Follow-up for this patient with chronic mood instability and alcohol abuse.  Patient today is telling me that he feels like he would be just as comfortable going home.  No longer insisting on alcohol and drug abuse treatment center.  Denies suicidal ideation.  Affect euthymic.  No new physical problems.  Agreeable to outpatient treatment.  No new complaints.  Blood pressure stable Principal Problem: Adjustment disorder with mixed disturbance of emotions and conduct Diagnosis: Principal Problem:   Adjustment disorder with mixed disturbance of emotions and conduct Active Problems:   HTN (hypertension)   Alcohol use disorder, severe, dependence (HCC)  Total Time spent with patient: 20 minutes  Past Psychiatric History: History of recurrent admissions because of substance abuse and suicidal ideation  Past Medical History:  Past Medical History:  Diagnosis Date  . Alcohol abuse   . Bronchitis   . Depression   . Diabetes mellitus without complication ([place])    lost weight diet mgmt at present  . Drug abuse ([place])   . Family history of adverse reaction to anesthesia   . GERD (gastroesophageal reflux disease)   . Gout   . Hypertension   . Morbid obesity ([place])   . Suicide ideation     Past Surgical History:  Procedure Laterality Date  . none    . TRICEPS TENDON REPAIR Right 09/26/2018   Procedure: Debridement and primary repair of partial-thickness right distal triceps tendon tear. ;  Surgeon: Corky Mull, MD;  Location: [facility] ORS;  Service: Orthopedics;  Laterality: Right;  . UPPER GASTROINTESTINAL ENDOSCOPY     Family History:  Family History  Problem Relation Age of Onset  . Hypertension Other   . Diabetes Mellitus II Other    Family Psychiatric  History: See previous Social History:  Social History   Substance and Sexual Activity  Alcohol Use Yes  . Alcohol/week: 0.0 standard drinks    Comment: 14 beers today     Social History   Substance and Sexual Activity  Drug Use Yes  . Frequency: 1.0 times per week  . Types: Marijuana, Cocaine   Comment: last used cocaine Monday; last smoked marijuana monday    Social History   Socioeconomic History  . Marital status: Divorced    Spouse name: Not on file  . Number of children: Not on file  . Years of education: Not on file  . Highest education level: Not on file  Occupational History  . Not on file  Social Needs  . Financial resource strain: Not on file  . Food insecurity    Worry: Not on file    Inability: Not on file  . Transportation needs    Medical: Not on file    Non-medical: Not on file  Tobacco Use  . Smoking status: Current Every Day Smoker    Packs/day: 1.00    Types: Cigarettes  . Smokeless tobacco: Current User    Types: Snuff  Substance and Sexual Activity  . Alcohol use: Yes    Alcohol/week: 0.0 standard drinks    Comment: 14 beers today  . Drug use: Yes    Frequency: 1.0 times per week    Types: Marijuana, Cocaine    Comment: last used cocaine Monday; last smoked marijuana monday  . Sexual activity: Not on file  Lifestyle  . Physical activity    Days per week: Not on file    Minutes per session:  Not on file  . Stress: Not on file  Relationships  . Social Herbalist on phone: Not on file    Gets together: Not on file    Attends religious service: Not on file    Active member of club or organization: Not on file    Attends meetings of clubs or organizations: Not on file    Relationship status: Not on file  Other Topics Concern  . Not on file  Social History Narrative  . Not on file   Additional Social History:                         Sleep: Fair  Appetite:  Fair  Current Medications: Current Facility-Administered Medications  Medication Dose Route Frequency Provider Last Rate Last Dose  . acetaminophen (TYLENOL) tablet 650 mg  650 mg Oral Q6H PRN Money,  [name], [name]   650 mg at 11/18/18 1557  . albuterol (PROVENTIL) (2.5 MG/3ML) 0.083% nebulizer solution 2.5 mg  2.5 mg Inhalation Q6H PRN ,  T, MD      . alum & mag hydroxide-simeth (MAALOX/MYLANTA) 200-200-20 MG/5ML suspension 30 mL  30 mL Oral Q4H PRN Money, Darnelle Maffucci B, [name]      . gabapentin (NEURONTIN) capsule 300 mg  300 mg Oral TID n Hai, MD   300 mg at 11/21/18 1312  . hydrOXYzine (ATARAX/VISTARIL) tablet 25 mg  25 mg Oral TID PRN Money, [name], [name]   25 mg at 11/16/18 1816  . magnesium hydroxide (MILK OF MAGNESIA) suspension 30 mL  30 mL Oral Daily PRN Money, [name], [name]      . metoprolol succinate (TOPROL-XL) 24 hr tablet 50 mg  50 mg Oral Daily n Hai, MD   50 mg at 11/21/18 [id number]  . NIFEdipine (ADALAT CC) 24 hr tablet 60 mg  60 mg Oral Daily , Madie Reno, MD   60 mg at 11/21/18 0824  . pantoprazole (PROTONIX) EC tablet 40 mg  40 mg Oral Daily , Madie Reno, MD   40 mg at 11/21/18 0823  . traZODone (DESYREL) tablet 50 mg  50 mg Oral QHS PRN Money, [name], [name]   50 mg at 11/18/18 2120  . vitamin B-12 (CYANOCOBALAMIN) tablet 1,000 mcg  1,000 mcg Oral Daily , Madie Reno, MD   1,000 mcg at 11/21/18 [id number]    Lab Results: No results found for this or any previous visit (from the past 48 hour(s)).  Blood Alcohol level:  Lab Results  Component Value Date   ETH 139 (H) 11/15/2018   ETH 244 (H) 99991111    Metabolic Disorder Labs: Lab Results  Component Value Date   HGBA1C 6.0 06/14/2016   MPG 126 01/03/2016   No results found for: PROLACTIN Lab Results  Component Value Date   CHOL 121 04/20/2016   TRIG 88 04/20/2016   HDL 38 (L) 04/20/2016   CHOLHDL 3.2 04/20/2016   VLDL 18 04/20/2016   LDLCALC 65 04/20/2016   LDLCALC 77 01/03/2016    Physical Findings: AIMS: Facial and Oral Movements Muscles of Facial Expression: None, normal Jaw: None, normal Tongue: None, normal,Extremity Movements Upper (arms, wrists, hands, fingers): None,  normal Lower (legs, knees, ankles, toes): None, normal, Trunk Movements Neck, shoulders, hips: None, normal, Overall Severity Severity of abnormal movements (highest score from questions above): None, normal Incapacitation due to abnormal movements: None, normal Patient's awareness of abnormal movements (rate only patient's report): No Awareness,  Dental Status Current problems with teeth and/or dentures?: No Does patient usually wear dentures?: No  CIWA:  CIWA-Ar Total: 2 COWS:  COWS Total Score: 4  Musculoskeletal: Strength & Muscle Tone: within normal limits Gait & Station: normal Patient leans: N/A  Psychiatric Specialty Exam: Physical Exam  Nursing note and vitals reviewed. Constitutional: He appears well-developed and well-nourished.  HENT:  Head: Normocephalic and atraumatic.  Eyes: Pupils are equal, round, and reactive to light. Conjunctivae are normal.  Neck: Normal range of motion.  Cardiovascular: Regular rhythm and normal heart sounds.  Respiratory: Effort normal.  GI: Soft.  Musculoskeletal: Normal range of motion.  Neurological: He is alert.  Skin: Skin is warm and dry.  Psychiatric: He has a normal mood and affect. His behavior is normal. Judgment and thought content normal.    Review of Systems  Constitutional: Negative.   HENT: Negative.   Eyes: Negative.   Respiratory: Negative.   Cardiovascular: Negative.   Gastrointestinal: Negative.   Musculoskeletal: Negative.   Skin: Negative.   Neurological: Negative.   Psychiatric/Behavioral: Negative.     Blood pressure 135/84, pulse 87, temperature 98.6 F (37 C), temperature source Oral, resp. rate 18, height 6' (1.829 m), weight 131.5 kg, SpO2 97 %.Body mass index is 39.33 kg/m.  General Appearance: Guarded  Eye Contact:  Fair  Speech:  Slow  Volume:  Normal  Mood:  Euthymic  Affect:  Constricted  Thought Process:  Coherent  Orientation:  Full (Time, Place, and Person)  Thought Content:  Logical   Suicidal Thoughts:  No  Homicidal Thoughts:  No  Memory:  Immediate;   Fair Recent;   Fair Remote;   Fair  Judgement:  Fair  Insight:  Fair  Psychomotor Activity:  Decreased  Concentration:  Concentration: Fair  Recall:  [employer] of Knowledge:  Fair  Language:  Fair  Akathisia:  No  Handed:  Right  AIMS (if indicated):     Assets:  Desire for Improvement Housing Social Support  ADL's:  Impaired  Cognition:  WNL  Sleep:  Number of Hours: 7.15     Treatment Plan Summary: Daily contact with patient to assess and evaluate symptoms and progress in treatment, Medication management and Plan Patient will be discharged tomorrow morning with follow-up for outpatient mental health and substance abuse treatment  Alethia Berthold, MD 11/21/2018, 4:14 PM

## 2018-11-22 ENCOUNTER — Other Ambulatory Visit: Payer: Self-pay

## 2018-11-22 ENCOUNTER — Emergency Department: Payer: Medicare Other

## 2018-11-22 ENCOUNTER — Emergency Department
Admission: EM | Admit: 2018-11-22 | Discharge: 2018-11-22 | Disposition: A | Discharge status: Home / Self Care | Payer: Medicare Other | Attending: Emergency Medicine | Admitting: Emergency Medicine

## 2018-11-22 DIAGNOSIS — I10 Essential (primary) hypertension: Secondary | ICD-10-CM | POA: Insufficient documentation

## 2018-11-22 DIAGNOSIS — F17210 Nicotine dependence, cigarettes, uncomplicated: Secondary | ICD-10-CM | POA: Insufficient documentation

## 2018-11-22 DIAGNOSIS — E119 Type 2 diabetes mellitus without complications: Secondary | ICD-10-CM | POA: Insufficient documentation

## 2018-11-22 DIAGNOSIS — R0789 Other chest pain: Secondary | ICD-10-CM | POA: Insufficient documentation

## 2018-11-22 LAB — CBC
HCT: 41 % (ref 39.0–52.0)
Hemoglobin: 13.6 g/dL (ref 13.0–17.0)
MCH: 29.5 pg (ref 26.0–34.0)
MCHC: 33.2 g/dL (ref 30.0–36.0)
MCV: 88.9 fL (ref 80.0–100.0)
Platelets: 230 10*3/uL (ref 150–400)
RBC: 4.61 MIL/uL (ref 4.22–5.81)
RDW: 13.7 % (ref 11.5–15.5)
WBC: 8.3 10*3/uL (ref 4.0–10.5)
nRBC: 0 % (ref 0.0–0.2)

## 2018-11-22 LAB — BASIC METABOLIC PANEL
Anion gap: 8 (ref 5–15)
BUN: 16 mg/dL (ref 6–20)
CO2: 27 mmol/L (ref 22–32)
Calcium: 9.6 mg/dL (ref 8.9–10.3)
Chloride: 105 mmol/L (ref 98–111)
Creatinine, Ser: 1.2 mg/dL (ref 0.61–1.24)
GFR calc Af Amer: >60 mL/min (ref >60)
GFR calc non Af Amer: >60 mL/min (ref >60)
Glucose, Bld: 159 mg/dL — ABNORMAL HIGH (ref 70–99)
Potassium: 3.9 mmol/L (ref 3.5–5.1)
Sodium: 140 mmol/L (ref 135–145)

## 2018-11-22 LAB — FIBRIN DERIVATIVES D-DIMER (ARMC ONLY): Fibrin derivatives D-dimer (ARMC): 683.09 ng/mL (FEU) — ABNORMAL HIGH (ref 0.00–499.00)

## 2018-11-22 LAB — TROPONIN I (HIGH SENSITIVITY)
Troponin I (High Sensitivity): 5 ng/L (ref <18)
Troponin I (High Sensitivity): 5 ng/L (ref <18)

## 2018-11-22 MED ORDER — ONDANSETRON HCL 4 MG/2ML IJ SOLN
4.0000 mg | Freq: Once | INTRAMUSCULAR | Status: AC
  Administered 2018-11-22: 4 mg via INTRAVENOUS
  Filled 2018-11-22: qty 2

## 2018-11-22 MED ORDER — IOHEXOL 350 MG/ML SOLN
75.0000 mL | Freq: Once | INTRAVENOUS | Status: AC | PRN
  Administered 2018-11-22: 12:00:00 75 mL via INTRAVENOUS

## 2018-11-22 MED ORDER — ALUM & MAG HYDROXIDE-SIMETH 200-200-20 MG/5ML PO SUSP
30.0000 mL | Freq: Once | ORAL | Status: AC
  Administered 2018-11-22: 30 mL via ORAL
  Filled 2018-11-22: qty 30

## 2018-11-22 MED ORDER — LIDOCAINE VISCOUS HCL 2 % MT SOLN
15.0000 mL | Freq: Once | OROMUCOSAL | Status: AC
  Administered 2018-11-22: 15 mL via ORAL
  Filled 2018-11-22: qty 15

## 2018-11-22 NOTE — Progress Notes (Signed)
Recreation Therapy Notes  INPATIENT RECREATION TR PLAN  Patient Details Name: Alan Eaton MRN: [id number] DOB: 1975/04/10 Today's Date: 11/22/2018  Rec Therapy Plan Is patient appropriate for Therapeutic Recreation?: Yes Treatment times per week: at least 3 Estimated Length of Stay: 5-7 days TR Treatment/Interventions: Group participation (Comment)  Discharge Criteria Pt will be discharged from therapy if:: Discharged Treatment plan/goals/alternatives discussed and agreed upon by:: Patient/family  Discharge Summary Short term goals set: Patient will engage in groups without prompting or encouragement from LRT x3 group sessions within 5 recreation therapy group sessions Short term goals met: Complete Progress toward goals comments: Groups attended Which groups?: Other (Comment)(Happiness, Emotions, Relaxation) Reason goals not met: N/A Therapeutic equipment acquired: N/A Reason patient discharged from therapy: Discharge from hospital Pt/family agrees with progress & goals achieved: Yes Date patient discharged from therapy: 11/22/18   Marrio Scribner 11/22/2018, 11:30 AM

## 2018-11-22 NOTE — Discharge Summary (Signed)
Physician Discharge Summary Note  Patient:  Alan Eaton is an 43 y.o., male MRN:  [id number] DOB:  01/06/1976 Patient phone:  [phone] (home)  Patient address:   [address] [place] [address],  Total Time spent with patient: 30 minutes  Date of Admission:  11/15/2018 Date of Discharge: November 22, 2018  Reason for Admission: Patient was admitted through the emergency room where he presented with mood disorder alcohol abuse suicidal ideation.  Patient claimed that he had overdosed on narcotics although the evidence is contradictory  Principal Problem: Adjustment disorder with mixed disturbance of emotions and conduct Discharge Diagnoses: Principal Problem:   Adjustment disorder with mixed disturbance of emotions and conduct Active Problems:   HTN (hypertension)   Alcohol use disorder, severe, dependence ([place])   Past Psychiatric History: Patient has a long history of alcohol abuse and mood instability.  Long history of personality problems and acting out especially when intoxicated.  Had been maintaining some sobriety evidently but seems to be in a downward.  The last couple months.  Past Medical History:  Past Medical History:  Diagnosis Date  . Alcohol abuse   . Bronchitis   . Depression   . Diabetes mellitus without complication ([place])    lost weight diet mgmt at present  . Drug abuse ([place])   . Family history of adverse reaction to anesthesia   . GERD (gastroesophageal reflux disease)   . Gout   . Hypertension   . Morbid obesity ([place])   . Suicide ideation     Past Surgical History:  Procedure Laterality Date  . none    . TRICEPS TENDON REPAIR Right 09/26/2018   Procedure: Debridement and primary repair of partial-thickness right distal triceps tendon tear. ;  Surgeon: Corky Mull, MD;  Location: [facility] ORS;  Service: Orthopedics;  Laterality: Right;  . UPPER GASTROINTESTINAL ENDOSCOPY     Family History:  Family History  Problem Relation Age of Onset   . Hypertension Other   . Diabetes Mellitus II Other    Family Psychiatric  History: Does not report Social History:  Social History   Substance and Sexual Activity  Alcohol Use Yes  . Alcohol/week: 0.0 standard drinks   Comment: 14 beers today     Social History   Substance and Sexual Activity  Drug Use Yes  . Frequency: 1.0 times per week  . Types: Marijuana, Cocaine   Comment: last used cocaine Monday; last smoked marijuana monday    Social History   Socioeconomic History  . Marital status: Divorced    Spouse name: Not on file  . Number of children: Not on file  . Years of education: Not on file  . Highest education level: Not on file  Occupational History  . Not on file  Social Needs  . Financial resource strain: Not on file  . Food insecurity    Worry: Not on file    Inability: Not on file  . Transportation needs    Medical: Not on file    Non-medical: Not on file  Tobacco Use  . Smoking status: Current Every Day Smoker    Packs/day: 1.00    Types: Cigarettes  . Smokeless tobacco: Current User    Types: Snuff  Substance and Sexual Activity  . Alcohol use: Yes    Alcohol/week: 0.0 standard drinks    Comment: 14 beers today  . Drug use: Yes    Frequency: 1.0 times per week    Types: Marijuana, Cocaine  Comment: last used cocaine Monday; last smoked marijuana monday  . Sexual activity: Not on file  Lifestyle  . Physical activity    Days per week: Not on file    Minutes per session: Not on file  . Stress: Not on file  Relationships  . Social Herbalist on phone: Not on file    Gets together: Not on file    Attends religious service: Not on file    Active member of club or organization: Not on file    Attends meetings of clubs or organizations: Not on file    Relationship status: Not on file  Other Topics Concern  . Not on file  Social History Narrative  . Not on file    Hospital Course: Patient admitted to psychiatric unit.   15-minute checks employed.  Patient did not display any dangerous aggressive or suicidal behavior on the unit.  He was cooperative with treatment evaluation and treatment team.  No major withdrawal symptoms noted.  Did not require lengthy detox.  Patient was started back on psychiatric medicines for chronic depression and anxiety.  Affect quickly returned to his baseline euthymic upbeat affect.  Denied suicidal ideation.  Patient had requested being referred to the alcohol and drug abuse treatment center and stated he did not want to return home to his mother but by the day of discharge had changed his mind about that and agreed to go back home to stay with his mother.  He has been accepted to the alcohol and drug abuse treatment Center in [place] but a bed is not yet available.  He is to follow-up with them about bed availability and follow-up with regular outpatient psychiatric treatment  Physical Findings: AIMS: Facial and Oral Movements Muscles of Facial Expression: None, normal Jaw: None, normal Tongue: None, normal,Extremity Movements Upper (arms, wrists, hands, fingers): None, normal Lower (legs, knees, ankles, toes): None, normal, Trunk Movements Neck, shoulders, hips: None, normal, Overall Severity Severity of abnormal movements (highest score from questions above): None, normal Incapacitation due to abnormal movements: None, normal Patient's awareness of abnormal movements (rate only patient's report): No Awareness, Dental Status Current problems with teeth and/or dentures?: No Does patient usually wear dentures?: No  CIWA:  CIWA-Ar Total: 2 COWS:  COWS Total Score: 4  Musculoskeletal: Strength & Muscle Tone: within normal limits Gait & Station: normal Patient leans: N/A  Psychiatric Specialty Exam: Physical Exam  Nursing note and vitals reviewed. Constitutional: He appears well-developed and well-nourished.  HENT:  Head: Normocephalic and atraumatic.  Eyes: Pupils are equal,  round, and reactive to light. Conjunctivae are normal.  Neck: Normal range of motion.  Cardiovascular: Regular rhythm and normal heart sounds.  Respiratory: Effort normal. No respiratory distress.  GI: Soft.  Musculoskeletal: Normal range of motion.  Neurological: He is alert.  Skin: Skin is warm and dry.  Psychiatric: He has a normal mood and affect. His behavior is normal. Judgment and thought content normal.    Review of Systems  Constitutional: Negative.   HENT: Negative.   Eyes: Negative.   Respiratory: Negative.   Cardiovascular: Negative.   Gastrointestinal: Negative.   Musculoskeletal: Negative.   Skin: Negative.   Neurological: Negative.   Psychiatric/Behavioral: Negative.     Blood pressure (!) 148/93, pulse (!) 102, temperature 97.7 F (36.5 C), temperature source Oral, resp. rate 17, height 6' (1.829 m), weight 131.5 kg, SpO2 97 %.Body mass index is 39.33 kg/m.  General Appearance: Casual  Eye Contact:  Good  Speech:  Clear and Coherent  Volume:  Normal  Mood:  Euthymic  Affect:  Congruent  Thought Process:  Goal Directed  Orientation:  Full (Time, Place, and Person)  Thought Content:  Logical  Suicidal Thoughts:  No  Homicidal Thoughts:  No  Memory:  Immediate;   Fair Recent;   Fair Remote;   Fair  Judgement:  Fair  Insight:  Fair  Psychomotor Activity:  Normal  Concentration:  Concentration: Fair  Recall:  [place] of Knowledge:  Fair  Language:  Fair  Akathisia:  No  Handed:  Right  AIMS (if indicated):     Assets:  Desire for Improvement Housing  ADL's:  Intact  Cognition:  WNL  Sleep:  Number of Hours: 7.15     Have you used any form of tobacco in the last 30 days? (Cigarettes, Smokeless Tobacco, Cigars, and/or Pipes): Yes  Has this patient used any form of tobacco in the last 30 days? (Cigarettes, Smokeless Tobacco, Cigars, and/or Pipes) Yes, Yes, A prescription for an FDA-approved tobacco cessation medication was offered at discharge and  the patient refused  Blood Alcohol level:  Lab Results  Component Value Date   ETH 139 (H) 11/15/2018   ETH 244 (H) 89/16/9450    Metabolic Disorder Labs:  Lab Results  Component Value Date   HGBA1C 6.0 06/14/2016   MPG 126 01/03/2016   No results found for: PROLACTIN Lab Results  Component Value Date   CHOL 121 04/20/2016   TRIG 88 04/20/2016   HDL 38 (L) 04/20/2016   CHOLHDL 3.2 04/20/2016   VLDL 18 04/20/2016   LDLCALC 65 04/20/2016   LDLCALC 77 01/03/2016    See Psychiatric Specialty Exam and Suicide Risk Assessment completed by Attending Physician prior to discharge.  Discharge destination:  Home  Is patient on multiple antipsychotic therapies at discharge:  No   Has Patient had three or more failed trials of antipsychotic monotherapy by history:  No  Recommended Plan for Multiple Antipsychotic Therapies: NA  Discharge Instructions    Diet - low sodium heart healthy   Complete by: As directed    Increase activity slowly   Complete by: As directed      Allergies as of 11/22/2018      Reactions   Bee Venom Swelling   Bactrim [sulfamethoxazole-trimethoprim] Swelling, Rash      Medication List    STOP taking these medications   hydrOXYzine 50 MG tablet Commonly known as: ATARAX/VISTARIL   promethazine 25 MG tablet Commonly known as: PHENERGAN     TAKE these medications     Indication  albuterol 108 (90 Base) MCG/ACT inhaler Commonly known as: Ventolin HFA Inhale 2 puffs into the lungs every 4 (four) hours as needed for wheezing or shortness of breath. What changed: See the new instructions.  Indication: Chronic Obstructive Lung Disease   gabapentin 300 MG capsule Commonly known as: NEURONTIN Take 1 capsule (300 mg total) by mouth 3 (three) times daily.  Indication: Fibromyalgia Syndrome   metoprolol succinate 50 MG 24 hr tablet Commonly known as: TOPROL-XL Take 1 tablet (50 mg total) by mouth daily. What changed:   medication  strength  how much to take  Indication: High Blood Pressure Disorder   NIFEdipine 60 MG 24 hr tablet Commonly known as: PROCARDIA XL/NIFEDICAL XL Take 1 tablet (60 mg total) by mouth daily.  Indication: High Blood Pressure Disorder   pantoprazole 40 MG tablet Commonly known as: PROTONIX Take 1 tablet (40 mg  total) by mouth daily. What changed: when to take this  Indication: Gastroesophageal Reflux Disease   traZODone 50 MG tablet Commonly known as: DESYREL Take 1 tablet (50 mg total) by mouth at bedtime as needed for sleep.  Indication: Trouble Sleeping   vitamin B-12 1000 MCG tablet Commonly known as: CYANOCOBALAMIN Take 1 tablet (1,000 mcg total) by mouth daily.  Indication: Inadequate Vitamin B12   Vivitrol 380 MG Susr Generic drug: Naltrexone 380 mg every 30 (thirty) days.  Indication: Abuse or Misuse of Alcohol      Follow-up [place] Follow [facility].   Why: You have been accepted and are awaiting a bed. Please call them daily to see when a bed will be available. Thank you. Contact information: [address] [place] [address] [phone]           Follow-up recommendations:  Activity:  Activity as tolerated Diet:  Regular diet Other:  Follow-up with outpatient treatment for substance abuse as well as following up with the inpatient treatment program in [place] patient  Comments: Patient was upbeat at time of discharge no longer met commitment criteria.  Agreed to outpatient treatment plan.  Signed: Alethia Berthold, MD 11/22/2018, 9:20 AM

## 2018-11-22 NOTE — Progress Notes (Signed)
Patient alert and oriented x 4, affect is blunted , mood is pleasant, interacting appropriately with peers and staff, he denies SI/HI/AVH no distress noted, he appears less anxious compliant with medication regimen and she attended evening wrap up group. 15 minutes safety checks maintained will continue to monitor.

## 2018-11-22 NOTE — Plan of Care (Signed)
  Problem: Group Participation Goal: STG - Patient will engage in groups without prompting or encouragement from LRT x3 group sessions within 5 recreation therapy group sessions Description: STG - Patient will engage in groups without prompting or encouragement from LRT x3 group sessions within 5 recreation therapy group sessions Outcome: Completed/Met

## 2018-11-22 NOTE — Progress Notes (Signed)
  [facility] Adult Case Management Discharge Plan :  Will you be returning to the same living situation after discharge:  Yes,  lives with mother At discharge, do you have transportation home?: Yes,  mother will pick up Do you have the ability to pay for your medications: Yes,  insurance  Release of information consent forms completed and in the chart;  [name] needed at discharge.  Patient to Follow up at: [place] Follow up.   Why: You have been accepted and are awaiting a bed. Please call them daily to see when a bed will be available. Thank you. Contact information: [address] [place] [address] [phone]           Next level of care provider has access to [place] and Suicide Prevention discussed: Yes,  with pt; pt declined family contact  Have you used any form of tobacco in the last 30 days? (Cigarettes, Smokeless Tobacco, Cigars, and/or Pipes): Yes  Has patient been referred to the Quitline?: N/A patient is not a smoker  Patient has been referred for addiction treatment: N/A  Yvette Rack, LCSW 11/22/2018, 8:46 AM

## 2018-11-22 NOTE — Plan of Care (Signed)
  Problem: Education: Goal: Ability to make informed decisions regarding treatment will improve Outcome: Progressing  Patient able to make informed decisions regardijng treatment plan

## 2018-11-22 NOTE — ED Notes (Signed)
Pt given phone to call mother for ride home

## 2018-11-22 NOTE — ED Triage Notes (Signed)
Reports centralized sharp chest pain that radiates to left arm, starting 30 minutes ago while at rest. Reports SOB after chest pain began, pt does not appear to be SOB. Pt alert and oriented X4, cooperative, RR even and unlabored, color WNL. Pt in NAD. positive for nausea and diaphoresis after chest pain began. Baseline nausea from acid reflux per pt history.

## 2018-11-22 NOTE — ED Notes (Signed)
Blood obtained from vein

## 2018-11-22 NOTE — ED Provider Notes (Signed)
[facility] Emergency Department Provider Note   ____________________________________________   First MD Initiated Contact with Patient 11/22/18 [phone]     (approximate)  I have reviewed the triage vital signs and the nursing notes.   HISTORY  Chief Complaint Chest Pain    HPI Alan Eaton is a 43 y.o. male with past medical history of hypertension, diabetes, alcohol abuse who presents to the ED complaining of chest pain.  Patient reports he was being discharged from the hospital after being admitted for suicidal ideations when he started having sharp pain in the center of his chest.  He states that it radiates down his right upper extremity and has been constant since onset.  Symptoms are not exacerbated or alleviated by anything.  He did have some associated nausea with 2 episodes of nonbilious and nonbloody vomiting.  He states he is otherwise been feeling well recently and denies any fevers, chills, cough, or shortness of breath.  He reports that he had a stress test "a while ago" and that it was normal.        Past Medical History:  Diagnosis Date  . Alcohol abuse   . Bronchitis   . Depression   . Diabetes mellitus without complication ([place])    lost weight diet mgmt at present  . Drug abuse ([place])   . Family history of adverse reaction to anesthesia   . GERD (gastroesophageal reflux disease)   . Gout   . Hypertension   . Morbid obesity ([place])   . Suicide ideation     Patient Active Problem List   Diagnosis Date Noted  . 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 MDD (major depressive disorder), severe ([place]) 10/31/2018  . Tachycardia 04/19/2016  . Factitious disorder 01/13/2016  . Unspecified depressive disorder 01/13/2016  . Alcohol use disorder, severe, dependence ([place]) 01/13/2016  . 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 HTN (hypertension) 01/05/2016  . Gout 01/05/2016  . GERD (gastroesophageal  reflux disease) 01/05/2016  . Diabetes ([place]) 01/05/2016  . Tobacco use disorder 01/05/2016  . Obesity 10/27/2015    Past Surgical History:  Procedure Laterality Date  . none    . TRICEPS TENDON REPAIR Right 09/26/2018   Procedure: Debridement and primary repair of partial-thickness right distal triceps tendon tear. ;  Surgeon: Corky Mull, MD;  Location: [facility] ORS;  Service: Orthopedics;  Laterality: Right;  . UPPER GASTROINTESTINAL ENDOSCOPY      Prior to Admission medications   Medication Sig Start Date End Date Taking? Authorizing Provider  gabapentin (NEURONTIN) 300 MG capsule Take 1 capsule (300 mg total) by mouth 3 (three) times daily. 11/21/18  Yes Clapacs, Madie Reno, MD  metoprolol succinate (TOPROL-XL) 50 MG 24 hr tablet Take 1 tablet (50 mg total) by mouth daily. 11/22/18  Yes Clapacs, Madie Reno, MD  NIFEdipine (PROCARDIA XL/NIFEDICAL XL) 60 MG 24 hr tablet Take 1 tablet (60 mg total) by mouth daily. 11/21/18  Yes Clapacs, Madie Reno, MD  pantoprazole (PROTONIX) 40 MG tablet Take 1 tablet (40 mg total) by mouth daily. 11/22/18  Yes Clapacs, Madie Reno, MD  traZODone (DESYREL) 50 MG tablet Take 1 tablet (50 mg total) by mouth at bedtime as needed for sleep. 11/21/18  Yes Clapacs, Madie Reno, MD  vitamin B-12 (CYANOCOBALAMIN) 1000 MCG tablet Take 1 tablet (1,000 mcg total) by mouth daily. 11/21/18  Yes Clapacs, Madie Reno, MD  VIVITROL 380 MG SUSR 380 mg every 30 (thirty) days. 11/14/18  Yes  [provider]  albuterol (VENTOLIN HFA) 108 (90 Base) MCG/ACT inhaler Inhale 2 puffs into the lungs every 4 (four) hours as needed for wheezing or shortness of breath. 11/21/18   Clapacs, Madie Reno, MD    Allergies Bee venom and Bactrim [sulfamethoxazole-trimethoprim]  Family History  Problem Relation Age of Onset  . Hypertension Other   . Diabetes Mellitus II Other     Social History Social History   Tobacco Use  . Smoking status: Current Every Day Smoker    Packs/day: 1.00    Types: Cigarettes  . Smokeless  tobacco: Current User    Types: Snuff  Substance Use Topics  . Alcohol use: Yes    Alcohol/week: 0.0 standard drinks    Comment: 14 beers today  . Drug use: Yes    Frequency: 1.0 times per week    Types: Marijuana, Cocaine    Comment: last used cocaine Monday; last smoked marijuana monday    Review of Systems  Constitutional: No fever/chills Eyes: No visual changes. ENT: No sore throat. Cardiovascular: Positive for chest pain. Respiratory: Denies shortness of breath. Gastrointestinal: No abdominal pain.  Positive for nausea and vomiting.  No diarrhea.  No constipation. Genitourinary: Negative for dysuria. Musculoskeletal: Negative for back pain. Skin: Negative for rash. Neurological: Negative for headaches, focal weakness or numbness.  ____________________________________________   PHYSICAL EXAM:  VITAL SIGNS: ED Triage Vitals [11/22/18 0934]  Enc Vitals Group     BP (!) 155/93     Pulse Rate (!) 116     Resp 20     Temp 98.2 F (36.8 C)     Temp Source Oral     SpO2 94 %     Weight 264 lb 8.8 oz (120 kg)     Height 6' (1.829 m)     Head Circumference      Peak Flow      Pain Score 8     Pain Loc      Pain Edu?      Excl. in [place]?     Constitutional: Alert and oriented.  Obese male. Eyes: Conjunctivae are normal. Head: Atraumatic. Nose: No congestion/rhinnorhea. Mouth/Throat: Mucous membranes are moist. Neck: Normal ROM Cardiovascular: Normal rate, regular rhythm. Grossly normal heart sounds. Respiratory: Normal respiratory effort.  No retractions. Lungs CTAB. Gastrointestinal: Soft and nontender. No distention. Genitourinary: deferred Musculoskeletal: No lower extremity tenderness nor edema. Neurologic:  Normal speech and language. No gross focal neurologic deficits are appreciated. Skin:  Skin is warm, dry and intact. No rash noted. Psychiatric: Mood and affect are normal. Speech and behavior are normal.  ____________________________________________    LABS (all labs ordered are listed, but only abnormal results are displayed)  Labs Reviewed  BASIC METABOLIC PANEL - Abnormal; Notable for the following components:      Result Value   Glucose, Bld 159 (*)    All other components within normal limits  FIBRIN DERIVATIVES D-DIMER ([facility] ONLY) - Abnormal; Notable for the following components:   Fibrin derivatives D-dimer ([facility]) 683.09 (*)    All other components within normal limits  CBC  TROPONIN I (HIGH SENSITIVITY)  TROPONIN I (HIGH SENSITIVITY)   ____________________________________________  EKG  ED ECG REPORT I, Blake Divine, the attending physician, personally viewed and interpreted this ECG.   Date: 11/22/2018  EKG Time: 9:38  Rate: 117  Rhythm: sinus bradycardia  Axis: LAD  Intervals:none  ST&T Change: Poor R wave progression    PROCEDURES  Procedure(s) performed (including Critical Care):  Procedures   ____________________________________________   INITIAL IMPRESSION / ASSESSMENT AND PLAN / ED COURSE       44 year old male with history of hypertension and diabetes presents to the ED complaining of acute onset chest pain radiating down his right arm just after being discharged from psychiatric admission.  EKG shows sinus tachycardia without acute ischemic changes.  Overall, low suspicion for ACS given patient with heart score of less than 4, will check 2 sets of troponin.  Will need to rule out PE given his recent admission and tachycardia.  He is low risk by [place], doubt PE if d-dimer negative.  There may also be a component of reflux, will treat with GI cocktail and Zofran.  D-dimer elevated however CTA negative for PE or other acute process.  2 sets of troponin within normal limits, do not suspect ACS given patient's heart score of less than 4.  He reports symptoms are improved following GI cocktail, suspect there is been element of GERD contributing to his symptoms.  Counseled patient to follow-up with PCP  and return to the ED for new or worsening symptoms.  Patient agrees with plan.      ____________________________________________   FINAL CLINICAL IMPRESSION(S) / ED DIAGNOSES  Final diagnoses:  Atypical chest pain     ED Discharge Orders    None       Note:  This document was prepared using Dragon voice recognition software and may include unintentional dictation errors.   Blake Divine, MD 11/22/18 [phone]

## 2018-11-22 NOTE — ED Notes (Signed)
Patient transported to CT 

## 2018-11-22 NOTE — ED Notes (Signed)
Pt reports chest pain that started 30 minutes ago that radiates down right arm. Reports N/V that started 30 minutes ago as well.

## 2018-11-22 NOTE — ED Notes (Signed)
Pt alert and oriented X4. NAD noted

## 2018-11-22 NOTE — Progress Notes (Signed)
Patient alert, oriented and ambulatory. Patient denies SI/HI and A/V hallucinations. Patient requesting to be discharge as soon as possible because mother is outside waiting. AVS/Follow-Up/Prescriptions reviewed with patient and written copies given to patient and he verbalized understanding. Patient denies pain at this time. All patient belongings returned to him and he verified receipt. Patient escorted off unit by staff to meet mom.

## 2018-12-14 ENCOUNTER — Emergency Department: Payer: Medicare Other

## 2018-12-14 ENCOUNTER — Encounter: Payer: Self-pay | Admitting: *Deleted

## 2018-12-14 ENCOUNTER — Other Ambulatory Visit: Payer: Self-pay

## 2018-12-14 ENCOUNTER — Emergency Department
Admission: EM | Admit: 2018-12-14 | Discharge: 2018-12-14 | Disposition: A | Discharge status: Home / Self Care | Payer: Medicare Other | Attending: Student | Admitting: Student

## 2018-12-14 DIAGNOSIS — E119 Type 2 diabetes mellitus without complications: Secondary | ICD-10-CM | POA: Diagnosis not present

## 2018-12-14 DIAGNOSIS — F17210 Nicotine dependence, cigarettes, uncomplicated: Secondary | ICD-10-CM | POA: Diagnosis not present

## 2018-12-14 DIAGNOSIS — Z79899 Other long term (current) drug therapy: Secondary | ICD-10-CM | POA: Diagnosis not present

## 2018-12-14 DIAGNOSIS — X500XXA Overexertion from strenuous movement or load, initial encounter: Secondary | ICD-10-CM | POA: Diagnosis not present

## 2018-12-14 DIAGNOSIS — Y9389 Activity, other specified: Secondary | ICD-10-CM | POA: Insufficient documentation

## 2018-12-14 DIAGNOSIS — Y990 Civilian activity done for income or pay: Secondary | ICD-10-CM | POA: Insufficient documentation

## 2018-12-14 DIAGNOSIS — Y9289 Other specified places as the place of occurrence of the external cause: Secondary | ICD-10-CM | POA: Diagnosis not present

## 2018-12-14 DIAGNOSIS — S4992XA Unspecified injury of left shoulder and upper arm, initial encounter: Secondary | ICD-10-CM

## 2018-12-14 DIAGNOSIS — S4991XA Unspecified injury of right shoulder and upper arm, initial encounter: Secondary | ICD-10-CM | POA: Diagnosis present

## 2018-12-14 DIAGNOSIS — I10 Essential (primary) hypertension: Secondary | ICD-10-CM | POA: Insufficient documentation

## 2018-12-14 DIAGNOSIS — S46311A Strain of muscle, fascia and tendon of triceps, right arm, initial encounter: Secondary | ICD-10-CM | POA: Insufficient documentation

## 2018-12-14 DIAGNOSIS — T148XXA Other injury of unspecified body region, initial encounter: Secondary | ICD-10-CM

## 2018-12-14 NOTE — ED Triage Notes (Signed)
Pt has pain in right arm and elbow.  Pt states he heard a pop.  Pt had surgery on right arm 2 months ago.  Pt states WC.  Pt alert   Speech clear.

## 2018-12-14 NOTE — ED Provider Notes (Signed)
[facility] Emergency Department Provider Note  ____________________________________________  Time seen: Approximately 4:38 PM  I have reviewed the triage vital signs and the nursing notes.   HISTORY  Chief Complaint Arm Pain    HPI Alan Eaton is a 43 y.o. male that presents to the emergency department for evaluation of right posterior pain after injury at work.  Patient states that he was lifting up a case of 36 waters at work and felt a pop in the back of his arm.  He has had pain to this area since.  No alleviating measures have been attempted.  No additional injuries.  He had surgery to this right elbow in July with Dr. Roland Rack.  No numbness, tingling.   Past Medical History:  Diagnosis Date  . Alcohol abuse   . Bronchitis   . Depression   . Diabetes mellitus without complication ([place])    lost weight diet mgmt at present  . Drug abuse ([place])   . Family history of adverse reaction to anesthesia   . GERD (gastroesophageal reflux disease)   . Gout   . Hypertension   . Morbid obesity ([place])   . Suicide ideation     Patient Active Problem List   Diagnosis Date Noted  . 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 MDD (major depressive disorder), severe ([place]) 10/31/2018  . Tachycardia 04/19/2016  . Factitious disorder 01/13/2016  . Unspecified depressive disorder 01/13/2016  . Alcohol use disorder, severe, dependence ([place]) 01/13/2016  . 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 HTN (hypertension) 01/05/2016  . Gout 01/05/2016  . GERD (gastroesophageal reflux disease) 01/05/2016  . Diabetes ([place]) 01/05/2016  . Tobacco use disorder 01/05/2016  . Obesity 10/27/2015    Past Surgical History:  Procedure Laterality Date  . none    . TRICEPS TENDON REPAIR Right 09/26/2018   Procedure: Debridement and primary repair of partial-thickness right distal triceps tendon tear. ;  Surgeon: Corky Mull, MD;  Location: [facility]  ORS;  Service: Orthopedics;  Laterality: Right;  . UPPER GASTROINTESTINAL ENDOSCOPY      Prior to Admission medications   Medication Sig Start Date End Date Taking? Authorizing Provider  albuterol (VENTOLIN HFA) 108 (90 Base) MCG/ACT inhaler Inhale 2 puffs into the lungs every 4 (four) hours as needed for wheezing or shortness of breath. 11/21/18   Clapacs, Madie Reno, MD  gabapentin (NEURONTIN) 300 MG capsule Take 1 capsule (300 mg total) by mouth 3 (three) times daily. 11/21/18   Clapacs, Madie Reno, MD  metoprolol succinate (TOPROL-XL) 50 MG 24 hr tablet Take 1 tablet (50 mg total) by mouth daily. 11/22/18   Clapacs, Madie Reno, MD  NIFEdipine (PROCARDIA XL/NIFEDICAL XL) 60 MG 24 hr tablet Take 1 tablet (60 mg total) by mouth daily. 11/21/18   Clapacs, Madie Reno, MD  pantoprazole (PROTONIX) 40 MG tablet Take 1 tablet (40 mg total) by mouth daily. 11/22/18   Clapacs, Madie Reno, MD  traZODone (DESYREL) 50 MG tablet Take 1 tablet (50 mg total) by mouth at bedtime as needed for sleep. 11/21/18   Clapacs, Madie Reno, MD  vitamin B-12 (CYANOCOBALAMIN) 1000 MCG tablet Take 1 tablet (1,000 mcg total) by mouth daily. 11/21/18   Clapacs, John T, MD  VIVITROL 380 MG SUSR 380 mg every 30 (thirty) days. 11/14/18   [provider]    Allergies Bee venom and Bactrim [sulfamethoxazole-trimethoprim]  Family History  Problem Relation Age of Onset  . Hypertension Other   .  Diabetes Mellitus II Other     Social History Social History   Tobacco Use  . Smoking status: Current Every Day Smoker    Packs/day: 1.00    Types: Cigarettes  . Smokeless tobacco: Current User    Types: Snuff  Substance Use Topics  . Alcohol use: Not Currently    Alcohol/week: 0.0 standard drinks    Comment: 14 beers today  . Drug use: Yes    Frequency: 1.0 times per week    Types: Marijuana, Cocaine    Comment: last used cocaine Monday; last smoked marijuana monday     Review of Systems  Constitutional: No fever/chills Cardiovascular: No  chest pain. Respiratory: No SOB. Gastrointestinal: No nausea, no vomiting.  Musculoskeletal: Positive for arm pain. Skin: Negative for rash, abrasions, lacerations, ecchymosis. Neurological: Negative for numbness or tingling   ____________________________________________   PHYSICAL EXAM:  VITAL SIGNS: ED Triage Vitals  Enc Vitals Group     BP 12/14/18 1545 (!) 138/97     Pulse Rate 12/14/18 1545 96     Resp 12/14/18 1545 20     Temp 12/14/18 1545 98.5 F (36.9 C)     Temp Source 12/14/18 1545 Oral     SpO2 12/14/18 1545 94 %     Weight --      Height --      Head Circumference --      Peak Flow --      Pain Score 12/14/18 1546 10     Pain Loc --      Pain Edu? --      Excl. in [place]? --      Constitutional: Alert and oriented. Well appearing and in no acute distress. Eyes: Conjunctivae are normal. PERRL. EOMI. Head: Atraumatic. ENT:      Ears:      Nose: No congestion/rhinnorhea.      Mouth/Throat: Mucous membranes are moist.  Neck: No stridor.  Cardiovascular: Normal rate, regular rhythm.  Good peripheral circulation.  Symmetric radial pulses. Respiratory: Normal respiratory effort without tachypnea or retractions. Lungs CTAB. Good air entry to the bases with no decreased or absent breath sounds. Musculoskeletal: Full range of motion to all extremities. No gross deformities appreciated.  Tenderness to palpation to triceps.  Strength equal in upper extremities bilaterally.  Full range of motion of right shoulder and elbow.  Grip strength intact. Neurologic:  Normal speech and language. No gross focal neurologic deficits are appreciated.  Skin:  Skin is warm, dry and intact. No rash noted. Psychiatric: Mood and affect are normal. Speech and behavior are normal. Patient exhibits appropriate insight and judgement.   ____________________________________________   LABS (all labs ordered are listed, but only abnormal results are displayed)  Labs Reviewed - No data to  display ____________________________________________  EKG   ____________________________________________  RADIOLOGY Robinette Haines, personally viewed and evaluated these images (plain radiographs) as part of my medical decision making, as well as reviewing the written report by the radiologist.  Dg Humerus Right  Result Date: 12/14/2018 CLINICAL DATA:  Onset right upper arm pain today. Patient heard a pop in his arm when he was caring heavy bottles today. History of triceps surgery 09/26/2018. EXAM: RIGHT HUMERUS - 2+ VIEW COMPARISON:  None. FINDINGS: There is no evidence of fracture or other focal bone lesions. Soft tissues are unremarkable. IMPRESSION: Negative exam. Electronically Signed   By: Inge Rise M.D.   On: 12/14/2018 17:10    ____________________________________________    PROCEDURES  Procedure(s) performed:  Procedures    Medications - No data to display   ____________________________________________   INITIAL IMPRESSION / ASSESSMENT AND PLAN / ED COURSE  Pertinent labs & imaging results that were available during my care of the patient were reviewed by me and considered in my medical decision making (see chart for details).  Review of the [place] CSRS was performed in accordance of the [place] prior to dispensing any controlled drugs.     Patient presenting the emergency department for evaluation of arm injury.  Vital signs and exam are reassuring.  X-ray negative for acute abnormalities.  Exam is consistent with muscle strain/tear.  Area was Ace wrap and shoulder sling was given.  Patient will follow-up with Dr. Roland Rack.  Patient is to follow up with Ortho as directed. Patient is given ED precautions to return to the ED for any worsening or new symptoms.  Alan Eaton was evaluated in Emergency Department on 12/14/2018 for the symptoms described in the history of present illness. He was evaluated in the context of the global COVID-19 pandemic, which  necessitated consideration that the patient might be at risk for infection with the SARS-CoV-2 virus that causes COVID-19. Institutional protocols and algorithms that pertain to the evaluation of patients at risk for COVID-19 are in a state of rapid change based on information released by regulatory bodies including the CDC and federal and state organizations. These policies and algorithms were followed during the patient's care in the ED.   ____________________________________________  FINAL CLINICAL IMPRESSION(S) / ED DIAGNOSES  Final diagnoses:  Injury of left upper extremity, initial encounter  Muscle strain      NEW MEDICATIONS STARTED DURING THIS VISIT:  ED Discharge Orders    None          This chart was dictated using voice recognition software/Dragon. Despite best efforts to proofread, errors can occur which can change the meaning. Any change was purely unintentional.    Laban Emperor, PA-C 12/14/18 1757    Lilia Pro., MD 12/15/18 [phone]

## 2018-12-14 NOTE — Discharge Instructions (Addendum)
Please wear Ace bandage and use shoulder sling until seen by Dr. Roland Rack.  Please call Dr. Nicholaus Bloom office tomorrow for an appointment next week for recheck.

## 2018-12-28 ENCOUNTER — Other Ambulatory Visit: Payer: Self-pay | Admitting: Psychiatry

## 2019-01-12 ENCOUNTER — Emergency Department: Payer: Medicare Other

## 2019-01-12 ENCOUNTER — Other Ambulatory Visit: Payer: Self-pay

## 2019-01-12 ENCOUNTER — Encounter: Payer: Self-pay | Admitting: Emergency Medicine

## 2019-01-12 ENCOUNTER — Emergency Department
Admission: EM | Admit: 2019-01-12 | Discharge: 2019-01-13 | Disposition: A | Discharge status: Home / Self Care | Payer: Medicare Other | Attending: Emergency Medicine | Admitting: Emergency Medicine

## 2019-01-12 DIAGNOSIS — Z79899 Other long term (current) drug therapy: Secondary | ICD-10-CM | POA: Diagnosis not present

## 2019-01-12 DIAGNOSIS — F17228 Nicotine dependence, chewing tobacco, with other nicotine-induced disorders: Secondary | ICD-10-CM | POA: Diagnosis not present

## 2019-01-12 DIAGNOSIS — E119 Type 2 diabetes mellitus without complications: Secondary | ICD-10-CM | POA: Insufficient documentation

## 2019-01-12 DIAGNOSIS — F17210 Nicotine dependence, cigarettes, uncomplicated: Secondary | ICD-10-CM | POA: Diagnosis not present

## 2019-01-12 DIAGNOSIS — F1210 Cannabis abuse, uncomplicated: Secondary | ICD-10-CM | POA: Insufficient documentation

## 2019-01-12 DIAGNOSIS — R2231 Localized swelling, mass and lump, right upper limb: Secondary | ICD-10-CM | POA: Diagnosis present

## 2019-01-12 DIAGNOSIS — M109 Gout, unspecified: Secondary | ICD-10-CM | POA: Diagnosis not present

## 2019-01-12 DIAGNOSIS — I10 Essential (primary) hypertension: Secondary | ICD-10-CM | POA: Insufficient documentation

## 2019-01-12 DIAGNOSIS — F1410 Cocaine abuse, uncomplicated: Secondary | ICD-10-CM | POA: Insufficient documentation

## 2019-01-12 LAB — SEDIMENTATION RATE: Sed Rate: 14 mm/hr (ref 0–15)

## 2019-01-12 LAB — CBC WITH DIFFERENTIAL/PLATELET
Abs Immature Granulocytes: 0.07 10*3/uL (ref 0.00–0.07)
Basophils Absolute: 0.1 10*3/uL (ref 0.0–0.1)
Basophils Relative: 0 %
Eosinophils Absolute: 0.5 10*3/uL (ref 0.0–0.5)
Eosinophils Relative: 4 %
HCT: 42.2 % (ref 39.0–52.0)
Hemoglobin: 14.1 g/dL (ref 13.0–17.0)
Immature Granulocytes: 1 %
Lymphocytes Relative: 19 %
Lymphs Abs: 2.3 10*3/uL (ref 0.7–4.0)
MCH: 29.8 pg (ref 26.0–34.0)
MCHC: 33.4 g/dL (ref 30.0–36.0)
MCV: 89.2 fL (ref 80.0–100.0)
Monocytes Absolute: 1.4 10*3/uL — ABNORMAL HIGH (ref 0.1–1.0)
Monocytes Relative: 11 %
Neutro Abs: 8.1 10*3/uL — ABNORMAL HIGH (ref 1.7–7.7)
Neutrophils Relative %: 65 %
Platelets: 267 10*3/uL (ref 150–400)
RBC: 4.73 MIL/uL (ref 4.22–5.81)
RDW: 14.1 % (ref 11.5–15.5)
WBC: 12.3 10*3/uL — ABNORMAL HIGH (ref 4.0–10.5)
nRBC: 0 % (ref 0.0–0.2)

## 2019-01-12 LAB — BASIC METABOLIC PANEL
Anion gap: 12 (ref 5–15)
BUN: 15 mg/dL (ref 6–20)
CO2: 25 mmol/L (ref 22–32)
Calcium: 9.1 mg/dL (ref 8.9–10.3)
Chloride: 104 mmol/L (ref 98–111)
Creatinine, Ser: 1.43 mg/dL — ABNORMAL HIGH (ref 0.61–1.24)
GFR calc Af Amer: >60 mL/min (ref >60)
GFR calc non Af Amer: 59 mL/min — ABNORMAL LOW (ref >60)
Glucose, Bld: 136 mg/dL — ABNORMAL HIGH (ref 70–99)
Potassium: 3.5 mmol/L (ref 3.5–5.1)
Sodium: 141 mmol/L (ref 135–145)

## 2019-01-12 LAB — LACTIC ACID, PLASMA: Lactic Acid, Venous: 1.9 mmol/L (ref 0.5–1.9)

## 2019-01-12 NOTE — ED Triage Notes (Signed)
Pt presents to ED with worsening swelling to the middle knuckle on his right middle finger. Onset of symptoms was several days ago. Pt states he has a hx of the same about 6 years ago and he had to be admitted for infection. Pt alert and talkative at this time. Finger has obvious swelling noted.

## 2019-01-12 NOTE — ED Provider Notes (Signed)
[facility] Emergency Department Provider Note  Time seen: 11:41 PM  I have reviewed the triage vital signs and the nursing notes.   HISTORY  Chief Complaint Hand Pain    HPI Alan Eaton is a 43 y.o. male diabetes, gastric reflux, hypertension, presents to the emergency department for right middle finger swelling.  According to the patient over the past 3 days or so he has noted swelling to his right middle finger.  States this occurred approximately 4 years ago to and they were not sure if there was an infection or gout.  Patient has a history of gout but states he typically only gets gout in his feet.  Denies any injuries to the finger, blunt trauma cuts or scrapes.  Patient denies any fever.  No cough or shortness of breath.  Past Medical History:  Diagnosis Date  . Alcohol abuse   . Bronchitis   . Depression   . Diabetes mellitus without complication ([place])    lost weight diet mgmt at present  . Drug abuse ([place])   . Family history of adverse reaction to anesthesia   . GERD (gastroesophageal reflux disease)   . Gout   . Hypertension   . Morbid obesity ([place])   . Suicide ideation     Patient Active Problem List   Diagnosis Date Noted  . 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 MDD (major depressive disorder), severe ([place]) 10/31/2018  . Tachycardia 04/19/2016  . Factitious disorder 01/13/2016  . Unspecified depressive disorder 01/13/2016  . Alcohol use disorder, severe, dependence ([place]) 01/13/2016  . 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 HTN (hypertension) 01/05/2016  . Gout 01/05/2016  . GERD (gastroesophageal reflux disease) 01/05/2016  . Diabetes ([place]) 01/05/2016  . Tobacco use disorder 01/05/2016  . Obesity 10/27/2015    Past Surgical History:  Procedure Laterality Date  . none    . TRICEPS TENDON REPAIR Right 09/26/2018   Procedure: Debridement and primary repair of partial-thickness right distal  triceps tendon tear. ;  Surgeon: Corky Mull, MD;  Location: [facility] ORS;  Service: Orthopedics;  Laterality: Right;  . UPPER GASTROINTESTINAL ENDOSCOPY      Prior to Admission medications   Medication Sig Start Date End Date Taking? Authorizing Provider  albuterol (VENTOLIN HFA) 108 (90 Base) MCG/ACT inhaler Inhale 2 puffs into the lungs every 4 (four) hours as needed for wheezing or shortness of breath. 11/21/18   Clapacs, Madie Reno, MD  gabapentin (NEURONTIN) 300 MG capsule TAKE 1 CAPSULE BY MOUTH 3 TIMES DAILY 01/08/19   Clapacs, Madie Reno, MD  metoprolol succinate (TOPROL-XL) 50 MG 24 hr tablet TAKE 1 TABLET BY MOUTH ONCE DAILY 01/08/19   Clapacs, Madie Reno, MD  NIFEdipine (PROCARDIA XL/NIFEDICAL XL) 60 MG 24 hr tablet Take 1 tablet (60 mg total) by mouth daily. 11/21/18   Clapacs, Madie Reno, MD  pantoprazole (PROTONIX) 40 MG tablet Take 1 tablet (40 mg total) by mouth daily. 11/22/18   Clapacs, Madie Reno, MD  traZODone (DESYREL) 50 MG tablet TAKE 1 TABLET BY MOUTH AT BEDTIME AS NEEDED SLEEP 01/08/19   Clapacs, Madie Reno, MD  vitamin B-12 (CYANOCOBALAMIN) 1000 MCG tablet Take 1 tablet (1,000 mcg total) by mouth daily. 11/21/18   Clapacs, John T, MD  VIVITROL 380 MG SUSR 380 mg every 30 (thirty) days. 11/14/18   [provider]    Allergies  Allergen Reactions  . Bee Venom Swelling  . Bactrim [Sulfamethoxazole-Trimethoprim] Swelling and Rash  Family History  Problem Relation Age of Onset  . Hypertension Other   . Diabetes Mellitus II Other     Social History Social History   Tobacco Use  . Smoking status: Current Every Day Smoker    Packs/day: 1.00    Types: Cigarettes  . Smokeless tobacco: Current User    Types: Snuff  Substance Use Topics  . Alcohol use: Not Currently    Alcohol/week: 0.0 standard drinks    Comment: not for 6 months  . Drug use: Yes    Frequency: 1.0 times per week    Types: Marijuana, Cocaine    Comment: not recently (quitting)    Review of  Systems Constitutional: Negative for fever. Cardiovascular: Negative for chest pain. Respiratory: Negative for shortness of breath. Gastrointestinal: Negative for abdominal pain Musculoskeletal: Right middle finger swelling and mild discomfort Skin: Negative for skin complaints  Neurological: Negative for headache All other ROS negative  ____________________________________________   PHYSICAL EXAM:  VITAL SIGNS: ED Triage Vitals  Enc Vitals Group     BP 01/12/19 2053 132/78     Pulse Rate 01/12/19 2053 (!) 113     Resp 01/12/19 2053 20     Temp 01/12/19 2053 98.7 F (37.1 C)     Temp Source 01/12/19 2053 Oral     SpO2 01/12/19 2053 93 %     Weight 01/12/19 2054 (!) 307 lb (139.3 kg)     Height 01/12/19 2054 6' (1.829 m)     Head Circumference --      Peak Flow --      Pain Score 01/12/19 2054 10     Pain Loc --      Pain Edu? --      Excl. in [place]? --    Constitutional: Alert and oriented. Well appearing and in no distress. Eyes: Normal exam ENT      Head: Normocephalic and atraumatic      Mouth/Throat: Mucous membranes are moist. Cardiovascular: Normal rate, regular rhythm. Respiratory: Normal respiratory effort without tachypnea nor retractions. Breath sounds are clear Gastrointestinal: Soft and nontender. No distention. Musculoskeletal: Mild to moderate swelling of the right first PIP joint of the middle finger. Neurologic:  Normal speech and language. No gross focal neurologic deficits Skin:  Skin is warm, dry and intact.  Psychiatric: Mood and affect are normal  ____________________________________________   RADIOLOGY  X-ray shows soft tissue swelling no other acute abnormality  ____________________________________________   INITIAL IMPRESSION / ASSESSMENT AND PLAN / ED COURSE  Pertinent labs & imaging results that were available during my care of the patient were reviewed by me and considered in my medical decision making (see chart for details).    Patient presents emergency department for right middle finger swelling over the past 3 days.  Patient has a history of gout but states it typically occurs in lower extremities.  States he did have similar swelling to the same joint approximately 4 years ago.  States he was treated with antibiotics and for gout and symptoms improved but they did not know if it was due to an infection or gout.  Fact that patient has no injury, cut scrapes etc. to the finger with recurrent swelling for years after an initial presentation I highly suspect gout in this patient.  Patient's lab work shows a very slight WBC elevation, normal sed rate.    Patient's uric acid level has resulted elevated.  Again highly suspect gout.  Normal ESR.  No sign of infection.  We will cover with colchicine have the patient follow-up with his doctor for recheck/reevaluation.  Etienne Mowers was evaluated in Emergency Department on 01/12/2019 for the symptoms described in the history of present illness. He was evaluated in the context of the global COVID-19 pandemic, which necessitated consideration that the patient might be at risk for infection with the SARS-CoV-2 virus that causes COVID-19. Institutional protocols and algorithms that pertain to the evaluation of patients at risk for COVID-19 are in a state of rapid change based on information released by regulatory bodies including the CDC and federal and state organizations. These policies and algorithms were followed during the patient's care in the ED.  ____________________________________________   FINAL CLINICAL IMPRESSION(S) / ED DIAGNOSES  Gout   Harvest Dark, MD 01/13/19 [phone]

## 2019-01-13 DIAGNOSIS — M109 Gout, unspecified: Secondary | ICD-10-CM | POA: Diagnosis not present

## 2019-01-13 LAB — URIC ACID: Uric Acid, Serum: 10.7 mg/dL — ABNORMAL HIGH (ref 3.7–8.6)

## 2019-01-13 LAB — C-REACTIVE PROTEIN: CRP: 2 mg/dL — ABNORMAL HIGH (ref <1.0)

## 2019-01-13 MED ORDER — COLCHICINE 0.6 MG PO TABS: 0.6000 mg | ORAL_TABLET | Freq: Every day | ORAL | 0 refills | Status: DC

## 2019-01-13 MED ORDER — COLCHICINE 0.6 MG PO TABS
1.2000 mg | ORAL_TABLET | Freq: Once | ORAL | Status: AC
  Administered 2019-01-13: 02:00:00 1.2 mg via ORAL
  Filled 2019-01-13: qty 2

## 2019-01-13 NOTE — ED Notes (Signed)
Pt's mother very upset regarding turn around time for uric acid. Mother and pt reminded to please leave masks in place while in the ED.

## 2019-01-13 NOTE — ED Notes (Signed)
Pt provided with po fluids.

## 2019-01-17 LAB — CULTURE, BLOOD (ROUTINE X 2): Culture: NO GROWTH

## 2019-02-17 ENCOUNTER — Other Ambulatory Visit: Payer: Self-pay | Admitting: Psychiatry

## 2019-03-12 ENCOUNTER — Emergency Department (HOSPITAL_COMMUNITY)
Admission: EM | Admit: 2019-03-12 | Discharge: 2019-03-12 | Discharge status: Against Medical Advice | Payer: Medicare Other | Attending: Emergency Medicine | Admitting: Emergency Medicine

## 2019-03-12 ENCOUNTER — Other Ambulatory Visit: Payer: Self-pay

## 2019-03-15 ENCOUNTER — Other Ambulatory Visit: Payer: Self-pay | Admitting: Psychiatry

## 2019-04-26 ENCOUNTER — Telehealth: Payer: Self-pay

## 2019-04-26 NOTE — Telephone Encounter (Signed)
This patient called requesting a refill on Pantoprazole 40mg . I see in chart that he was treated in the ER on 11/15/18 but no office visits are noted. The last time this medication was filled on 11/22/18 it was sent to [facility] in [place]. Thank you.

## 2019-05-18 ENCOUNTER — Other Ambulatory Visit: Payer: Self-pay | Admitting: Psychiatry

## 2019-09-05 ENCOUNTER — Other Ambulatory Visit: Payer: Self-pay

## 2019-09-05 ENCOUNTER — Encounter (HOSPITAL_COMMUNITY): Payer: Self-pay | Admitting: Emergency Medicine

## 2019-09-05 ENCOUNTER — Emergency Department (HOSPITAL_COMMUNITY)
Admission: EM | Admit: 2019-09-05 | Discharge: 2019-09-05 | Disposition: A | Discharge status: Home / Self Care | Payer: Medicare Other | Attending: Emergency Medicine | Admitting: Emergency Medicine

## 2019-09-05 DIAGNOSIS — I10 Essential (primary) hypertension: Secondary | ICD-10-CM | POA: Diagnosis not present

## 2019-09-05 DIAGNOSIS — E119 Type 2 diabetes mellitus without complications: Secondary | ICD-10-CM | POA: Diagnosis not present

## 2019-09-05 DIAGNOSIS — R609 Edema, unspecified: Secondary | ICD-10-CM | POA: Insufficient documentation

## 2019-09-05 DIAGNOSIS — R45851 Suicidal ideations: Secondary | ICD-10-CM | POA: Diagnosis not present

## 2019-09-05 DIAGNOSIS — Z79899 Other long term (current) drug therapy: Secondary | ICD-10-CM | POA: Insufficient documentation

## 2019-09-05 DIAGNOSIS — F17210 Nicotine dependence, cigarettes, uncomplicated: Secondary | ICD-10-CM | POA: Insufficient documentation

## 2019-09-05 DIAGNOSIS — R32 Unspecified urinary incontinence: Secondary | ICD-10-CM | POA: Diagnosis present

## 2019-09-05 LAB — CBC WITH DIFFERENTIAL/PLATELET
Abs Immature Granulocytes: 0.03 10*3/uL (ref 0.00–0.07)
Basophils Absolute: 0 10*3/uL (ref 0.0–0.1)
Basophils Relative: 0 %
Eosinophils Absolute: 0.6 10*3/uL — ABNORMAL HIGH (ref 0.0–0.5)
Eosinophils Relative: 6 %
HCT: 42.8 % (ref 39.0–52.0)
Hemoglobin: 13.9 g/dL (ref 13.0–17.0)
Immature Granulocytes: 0 %
Lymphocytes Relative: 26 %
Lymphs Abs: 2.3 10*3/uL (ref 0.7–4.0)
MCH: 30.8 pg (ref 26.0–34.0)
MCHC: 32.5 g/dL (ref 30.0–36.0)
MCV: 94.9 fL (ref 80.0–100.0)
Monocytes Absolute: 0.9 10*3/uL (ref 0.1–1.0)
Monocytes Relative: 10 %
Neutro Abs: 5.1 10*3/uL (ref 1.7–7.7)
Neutrophils Relative %: 58 %
Platelets: 283 10*3/uL (ref 150–400)
RBC: 4.51 MIL/uL (ref 4.22–5.81)
RDW: 14.1 % (ref 11.5–15.5)
WBC: 9 10*3/uL (ref 4.0–10.5)
nRBC: 0 % (ref 0.0–0.2)

## 2019-09-05 LAB — COMPREHENSIVE METABOLIC PANEL
ALT: 58 U/L — ABNORMAL HIGH (ref 0–44)
AST: 46 U/L — ABNORMAL HIGH (ref 15–41)
Albumin: 4.2 g/dL (ref 3.5–5.0)
Alkaline Phosphatase: 69 U/L (ref 38–126)
Anion gap: 9 (ref 5–15)
BUN: 10 mg/dL (ref 6–20)
CO2: 28 mmol/L (ref 22–32)
Calcium: 9 mg/dL (ref 8.9–10.3)
Chloride: 101 mmol/L (ref 98–111)
Creatinine, Ser: 1.32 mg/dL — ABNORMAL HIGH (ref 0.61–1.24)
GFR calc Af Amer: >60 mL/min (ref >60)
GFR calc non Af Amer: >60 mL/min (ref >60)
Glucose, Bld: 109 mg/dL — ABNORMAL HIGH (ref 70–99)
Potassium: 3.8 mmol/L (ref 3.5–5.1)
Sodium: 138 mmol/L (ref 135–145)
Total Bilirubin: 0.8 mg/dL (ref 0.3–1.2)
Total Protein: 8.4 g/dL — ABNORMAL HIGH (ref 6.5–8.1)

## 2019-09-05 LAB — URINALYSIS, ROUTINE W REFLEX MICROSCOPIC
Bilirubin Urine: NEGATIVE
Glucose, UA: NEGATIVE mg/dL
Hgb urine dipstick: NEGATIVE
Ketones, ur: NEGATIVE mg/dL
Leukocytes,Ua: NEGATIVE
Nitrite: NEGATIVE
Protein, ur: NEGATIVE mg/dL
Specific Gravity, Urine: 1.002 — ABNORMAL LOW (ref 1.005–1.030)
pH: 6 (ref 5.0–8.0)

## 2019-09-05 LAB — LIPASE, BLOOD: Lipase: 76 U/L — ABNORMAL HIGH (ref 11–51)

## 2019-09-05 LAB — ETHANOL: Alcohol, Ethyl (B): 138 mg/dL — ABNORMAL HIGH (ref <10)

## 2019-09-05 NOTE — ED Provider Notes (Signed)
[facility] DEPT Provider Note   CSN: [id number] Arrival date & time: 09/05/19  7902     History Chief Complaint  Patient presents with  . Urinary Incontinence  . Leg Swelling    Alan Eaton is a 44 y.o. male.  HPI    Patient with multiple medical issues including history of alcohol abuse presents with concern for bilateral lower extremity swelling and an episode of cough associated urinary incontinence. Patient states that he has been doing generally well, but now over the past 2 days he has developed new symptoms. He states his last alcohol use was about 3 months ago, and he has been abstaining successfully. He has been smoking cigarettes, however.  No drug use. He states that he has been taking all of his prescription medication as directed. About 2 days ago patient noticed increasing swelling in both extremities distally, lower, symmetrically. Today the patient had 1 episode of coughing, followed by an episode of urinary incontinence.  He denies lower abdominal pain, or any pain anywhere beyond his discomfort in his legs. No associated fecal incontinence, no additional episodes of urinary incontinence, but with all of these concerns he presents for evaluation. Past Medical History:  Diagnosis Date  . Alcohol abuse   . Bronchitis   . Depression   . Diabetes mellitus without complication ([place])    lost weight diet mgmt at present  . Drug abuse ([place])   . Family history of adverse reaction to anesthesia   . GERD (gastroesophageal reflux disease)   . Gout   . Hypertension   . Morbid obesity ([place])   . Suicide ideation     Patient Active Problem List   Diagnosis Date Noted  . 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 MDD (major depressive disorder), severe ([place]) 10/31/2018  . Tachycardia 04/19/2016  . Factitious disorder 01/13/2016  . Unspecified depressive disorder 01/13/2016  . Alcohol use  disorder, severe, dependence ([place]) 01/13/2016  . 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 HTN (hypertension) 01/05/2016  . Gout 01/05/2016  . GERD (gastroesophageal reflux disease) 01/05/2016  . Diabetes ([place]) 01/05/2016  . Tobacco use disorder 01/05/2016  . Obesity 10/27/2015    Past Surgical History:  Procedure Laterality Date  . none    . TRICEPS TENDON REPAIR Right 09/26/2018   Procedure: Debridement and primary repair of partial-thickness right distal triceps tendon tear. ;  Surgeon: Corky Mull, MD;  Location: [facility] ORS;  Service: Orthopedics;  Laterality: Right;  . UPPER GASTROINTESTINAL ENDOSCOPY         Family History  Problem Relation Age of Onset  . Hypertension Other   . Diabetes Mellitus II Other     Social History   Tobacco Use  . Smoking status: Current Every Day Smoker    Packs/day: 1.00    Types: Cigarettes  . Smokeless tobacco: Current User    Types: Snuff  Vaping Use  . Vaping Use: Some days  Substance Use Topics  . Alcohol use: Not Currently    Alcohol/week: 0.0 standard drinks    Comment: not for 6 months  . Drug use: Yes    Frequency: 1.0 times per week    Types: Marijuana, Cocaine    Comment: not recently (quitting)    Home Medications Prior to Admission medications   Medication Sig Start Date End Date Taking? Authorizing Provider  albuterol (VENTOLIN HFA) 108 (90 Base) MCG/ACT inhaler Inhale 2 puffs into the lungs every 4 (four) hours as  needed for wheezing or shortness of breath. 11/21/18  Yes Clapacs, Madie Reno, MD  allopurinol (ZYLOPRIM) 100 MG tablet Take 100 mg by mouth daily.   Yes [provider]  allopurinol (ZYLOPRIM) 300 MG tablet Take 300 mg by mouth daily.   Yes [provider]  amLODipine (NORVASC) 5 MG tablet Take 5 mg by mouth daily.   Yes [provider]  cloNIDine (CATAPRES) 0.1 MG tablet Take 0.1 mg by mouth 2 (two) times daily.   Yes [provider]  etodolac (LODINE) 500 MG tablet Take 500 mg  by mouth 2 (two) times daily.   Yes [provider]  furosemide (LASIX) 80 MG tablet Take 80 mg by mouth daily.   Yes [provider]  gabapentin (NEURONTIN) 300 MG capsule TAKE 1 CAPSULE BY MOUTH 3 TIMES DAILY 01/08/19  Yes Clapacs, Madie Reno, MD  hydrOXYzine (ATARAX/VISTARIL) 50 MG tablet Take 60 mg by mouth daily.   Yes [provider]  NIFEdipine (PROCARDIA XL/NIFEDICAL XL) 60 MG 24 hr tablet Take 1 tablet (60 mg total) by mouth daily. 11/21/18  Yes Clapacs, Madie Reno, MD  pantoprazole (PROTONIX) 40 MG tablet Take 1 tablet (40 mg total) by mouth daily. 11/22/18  Yes Clapacs, Madie Reno, MD  promethazine (PHENERGAN) 25 MG tablet Take 25 mg by mouth as needed for nausea or vomiting.   Yes [provider]  traZODone (DESYREL) 50 MG tablet TAKE 1 TABLET BY MOUTH AT BEDTIME AS NEEDED SLEEP 03/02/19  Yes Clapacs, Madie Reno, MD  vitamin B-12 (CYANOCOBALAMIN) 1000 MCG tablet Take 1 tablet (1,000 mcg total) by mouth daily. 11/21/18  Yes Clapacs, Madie Reno, MD  VIVITROL 380 MG SUSR 380 mg every 30 (thirty) days. 11/14/18  Yes [provider]  colchicine 0.6 MG tablet Take 1 tablet (0.6 mg total) by mouth daily. Patient not taking: Reported on 09/05/2019 01/13/19 01/13/20  Harvest Dark, MD  metoprolol succinate (TOPROL-XL) 50 MG 24 hr tablet TAKE 1 TABLET BY MOUTH ONCE DAILY Patient not taking: Reported on 09/05/2019 03/16/19   Clapacs, Madie Reno, MD    Allergies    Bee venom and Bactrim [sulfamethoxazole-trimethoprim]  Review of Systems   Review of Systems  Constitutional:       Per HPI, otherwise negative  HENT:       Per HPI, otherwise negative  Respiratory:       Per HPI, otherwise negative  Cardiovascular:       Per HPI, otherwise negative  Gastrointestinal: Negative for vomiting.  Endocrine:       Negative aside from HPI  Genitourinary:       Neg aside from HPI   Musculoskeletal:       Per HPI, otherwise negative  Skin: Negative.   Neurological: Negative for  syncope.    Physical Exam Updated Vital Signs BP (!) 166/101 (BP Location: Right Arm)   Pulse 87   Temp 98.2 F (36.8 C) (Oral)   Resp 18   SpO2 97%   Physical Exam Vitals and nursing note reviewed.  Constitutional:      General: He is not in acute distress.    Appearance: He is well-developed. He is obese.  HENT:     Head: Normocephalic and atraumatic.  Eyes:     Conjunctiva/sclera: Conjunctivae normal.  Cardiovascular:     Rate and Rhythm: Normal rate and regular rhythm.  Pulmonary:     Effort: Pulmonary effort is normal. No respiratory distress.     Breath sounds: No  stridor.  Abdominal:     General: There is no distension.     Tenderness: There is no abdominal tenderness. There is no guarding.  Musculoskeletal:        General: No deformity.     Right lower leg: Edema present.     Left lower leg: Edema present.  Skin:    General: Skin is warm and dry.  Neurological:     Mental Status: He is alert and oriented to person, place, and time.     ED Results / Procedures / Treatments   Labs (all labs ordered are listed, but only abnormal results are displayed) Labs Reviewed  COMPREHENSIVE METABOLIC PANEL - Abnormal; Notable for the following components:      Result Value   Glucose, Bld 109 (*)    Creatinine, Ser 1.32 (*)    Total Protein 8.4 (*)    AST 46 (*)    ALT 58 (*)    All other components within normal limits  ETHANOL - Abnormal; Notable for the following components:   Alcohol, Ethyl (B) 138 (*)    All other components within normal limits  LIPASE, BLOOD - Abnormal; Notable for the following components:   Lipase 76 (*)    All other components within normal limits  CBC WITH DIFFERENTIAL/PLATELET - Abnormal; Notable for the following components:   Eosinophils Absolute 0.6 (*)    All other components within normal limits  URINALYSIS, ROUTINE W REFLEX MICROSCOPIC - Abnormal; Notable for the following components:   Color, Urine STRAW (*)    Specific  Gravity, Urine 1.002 (*)    All other components within normal limits    EKG None  Radiology No results found.  Procedures Procedures (including critical care time)  Medications Ordered in ED Medications - No data to display  ED Course  I have reviewed the triage vital signs and the nursing notes.  Pertinent labs & imaging results that were available during my care of the patient were reviewed by me and considered in my medical decision making (see chart for details).     Chart review notable for history of alcohol abuse, drug abuse, diabetes, hypertension, obesity. With consideration of liver dysfunction, renal dysfunction, heart failure, though this latter is less likely given his description of no dyspnea, no chest pain, no hypoxia on exam, the patient had labs, urinalysis ordered. MDM Rules/Calculators/A&P 11:55 AM Patient awake, alert, ambulatory. We reviewed his findings at length. He is now accompanied by a male companion, identified as his fiance. Evaluation are generally reassuring, though notably, the patient had elevated alcohol level, and now in discussing this he states that he did have alcohol yesterday.  Is likely contributing to his ongoing symptoms.  No evidence for substantial renal or hepatic dysfunction, no evidence for distress, no dyspnea, no hypoxia, all reassuring. Patient amenable to, requesting discharge, stating he is ready to go.  Patient will follow up with primary care. Final Clinical Impression(s) / ED Diagnoses Final diagnoses:  Peripheral edema     Carmin Muskrat, MD 09/05/19 1156

## 2019-09-05 NOTE — ED Triage Notes (Signed)
Patient has urinary incontinence and swelling in bilateral legs for 3 days.

## 2019-09-05 NOTE — Discharge Instructions (Signed)
As discussed, your evaluation today has been largely reassuring.  But, it is important that you monitor your condition carefully, and do not hesitate to return to the ED if you develop new, or concerning changes in your condition. ? ?Otherwise, please follow-up with your physician for appropriate ongoing care. ? ?

## 2019-10-14 ENCOUNTER — Emergency Department: Payer: Medicare Other

## 2019-10-14 ENCOUNTER — Other Ambulatory Visit: Payer: Self-pay

## 2019-10-14 ENCOUNTER — Emergency Department
Admission: EM | Admit: 2019-10-14 | Discharge: 2019-10-14 | Disposition: A | Discharge status: Home / Self Care | Payer: Medicare Other | Attending: Emergency Medicine | Admitting: Emergency Medicine

## 2019-10-14 DIAGNOSIS — N50819 Testicular pain, unspecified: Secondary | ICD-10-CM | POA: Diagnosis not present

## 2019-10-14 DIAGNOSIS — Z5321 Procedure and treatment not carried out due to patient leaving prior to being seen by health care provider: Secondary | ICD-10-CM | POA: Diagnosis not present

## 2019-10-14 DIAGNOSIS — N5089 Other specified disorders of the male genital organs: Secondary | ICD-10-CM | POA: Insufficient documentation

## 2019-10-14 LAB — URINALYSIS, COMPLETE (UACMP) WITH MICROSCOPIC
Bacteria, UA: NONE SEEN
Bilirubin Urine: NEGATIVE
Glucose, UA: NEGATIVE mg/dL
Hgb urine dipstick: NEGATIVE
Ketones, ur: NEGATIVE mg/dL
Leukocytes,Ua: NEGATIVE
Nitrite: NEGATIVE
Protein, ur: 100 mg/dL — AB
Specific Gravity, Urine: 1.02 (ref 1.005–1.030)
pH: 5 (ref 5.0–8.0)

## 2019-10-14 LAB — COMPREHENSIVE METABOLIC PANEL
ALT: 51 U/L — ABNORMAL HIGH (ref 0–44)
AST: 38 U/L (ref 15–41)
Albumin: 3.9 g/dL (ref 3.5–5.0)
Alkaline Phosphatase: 64 U/L (ref 38–126)
Anion gap: 12 (ref 5–15)
BUN: 14 mg/dL (ref 6–20)
CO2: 27 mmol/L (ref 22–32)
Calcium: 9.6 mg/dL (ref 8.9–10.3)
Chloride: 103 mmol/L (ref 98–111)
Creatinine, Ser: 1.49 mg/dL — ABNORMAL HIGH (ref 0.61–1.24)
GFR calc Af Amer: >60 mL/min (ref >60)
GFR calc non Af Amer: 55 mL/min — ABNORMAL LOW (ref >60)
Glucose, Bld: 113 mg/dL — ABNORMAL HIGH (ref 70–99)
Potassium: 3.5 mmol/L (ref 3.5–5.1)
Sodium: 142 mmol/L (ref 135–145)
Total Bilirubin: 0.6 mg/dL (ref 0.3–1.2)
Total Protein: 8 g/dL (ref 6.5–8.1)

## 2019-10-14 LAB — CBC
HCT: 42.7 % (ref 39.0–52.0)
Hemoglobin: 14.4 g/dL (ref 13.0–17.0)
MCH: 30.8 pg (ref 26.0–34.0)
MCHC: 33.7 g/dL (ref 30.0–36.0)
MCV: 91.4 fL (ref 80.0–100.0)
Platelets: 265 10*3/uL (ref 150–400)
RBC: 4.67 MIL/uL (ref 4.22–5.81)
RDW: 14 % (ref 11.5–15.5)
WBC: 9.1 10*3/uL (ref 4.0–10.5)
nRBC: 0 % (ref 0.0–0.2)

## 2019-10-14 NOTE — ED Triage Notes (Signed)
Pt states that for the pas couple days he has had a large amount of swelling to his scrotum, states that this happened once before and he had to be admitted, reports no known reason why it is happening

## 2020-01-25 ENCOUNTER — Encounter: Payer: Self-pay | Admitting: Urology

## 2020-01-25 ENCOUNTER — Ambulatory Visit: Payer: Medicare Other | Admitting: Urology

## 2020-05-05 ENCOUNTER — Emergency Department (HOSPITAL_COMMUNITY)
Admission: EM | Admit: 2020-05-05 | Discharge: 2020-05-06 | Disposition: A | Discharge status: Home / Self Care | Payer: Medicare Other | Attending: Emergency Medicine | Admitting: Emergency Medicine

## 2020-05-05 ENCOUNTER — Other Ambulatory Visit: Payer: Self-pay

## 2020-05-05 DIAGNOSIS — Z79899 Other long term (current) drug therapy: Secondary | ICD-10-CM | POA: Insufficient documentation

## 2020-05-05 DIAGNOSIS — F419 Anxiety disorder, unspecified: Secondary | ICD-10-CM | POA: Insufficient documentation

## 2020-05-05 DIAGNOSIS — I10 Essential (primary) hypertension: Secondary | ICD-10-CM | POA: Diagnosis not present

## 2020-05-05 DIAGNOSIS — F17210 Nicotine dependence, cigarettes, uncomplicated: Secondary | ICD-10-CM | POA: Insufficient documentation

## 2020-05-05 DIAGNOSIS — F10129 Alcohol abuse with intoxication, unspecified: Secondary | ICD-10-CM | POA: Diagnosis not present

## 2020-05-05 DIAGNOSIS — R0602 Shortness of breath: Secondary | ICD-10-CM | POA: Diagnosis not present

## 2020-05-05 DIAGNOSIS — E119 Type 2 diabetes mellitus without complications: Secondary | ICD-10-CM | POA: Insufficient documentation

## 2020-05-06 ENCOUNTER — Encounter (HOSPITAL_COMMUNITY): Payer: Self-pay

## 2020-05-06 ENCOUNTER — Emergency Department (HOSPITAL_COMMUNITY): Payer: Medicare Other

## 2020-05-06 DIAGNOSIS — R0602 Shortness of breath: Secondary | ICD-10-CM | POA: Diagnosis not present

## 2020-05-06 LAB — COMPREHENSIVE METABOLIC PANEL
ALT: 49 U/L — ABNORMAL HIGH (ref 0–44)
AST: 35 U/L (ref 15–41)
Albumin: 4.2 g/dL (ref 3.5–5.0)
Alkaline Phosphatase: 84 U/L (ref 38–126)
Anion gap: 12 (ref 5–15)
BUN: 11 mg/dL (ref 6–20)
CO2: 29 mmol/L (ref 22–32)
Calcium: 8.9 mg/dL (ref 8.9–10.3)
Chloride: 96 mmol/L — ABNORMAL LOW (ref 98–111)
Creatinine, Ser: 1.13 mg/dL (ref 0.61–1.24)
GFR, Estimated: >60 mL/min (ref >60)
Glucose, Bld: 107 mg/dL — ABNORMAL HIGH (ref 70–99)
Potassium: 3.6 mmol/L (ref 3.5–5.1)
Sodium: 137 mmol/L (ref 135–145)
Total Bilirubin: 0.5 mg/dL (ref 0.3–1.2)
Total Protein: 8.4 g/dL — ABNORMAL HIGH (ref 6.5–8.1)

## 2020-05-06 LAB — CBC WITH DIFFERENTIAL/PLATELET
Abs Immature Granulocytes: 0.03 10*3/uL (ref 0.00–0.07)
Basophils Absolute: 0 10*3/uL (ref 0.0–0.1)
Basophils Relative: 0 %
Eosinophils Absolute: 0.5 10*3/uL (ref 0.0–0.5)
Eosinophils Relative: 5 %
HCT: 42.6 % (ref 39.0–52.0)
Hemoglobin: 13.7 g/dL (ref 13.0–17.0)
Immature Granulocytes: 0 %
Lymphocytes Relative: 30 %
Lymphs Abs: 2.5 10*3/uL (ref 0.7–4.0)
MCH: 29.7 pg (ref 26.0–34.0)
MCHC: 32.2 g/dL (ref 30.0–36.0)
MCV: 92.4 fL (ref 80.0–100.0)
Monocytes Absolute: 0.7 10*3/uL (ref 0.1–1.0)
Monocytes Relative: 9 %
Neutro Abs: 4.6 10*3/uL (ref 1.7–7.7)
Neutrophils Relative %: 56 %
Platelets: 243 10*3/uL (ref 150–400)
RBC: 4.61 MIL/uL (ref 4.22–5.81)
RDW: 14.6 % (ref 11.5–15.5)
WBC: 8.3 10*3/uL (ref 4.0–10.5)
nRBC: 0 % (ref 0.0–0.2)

## 2020-05-06 LAB — BLOOD GAS, VENOUS
Acid-Base Excess: 4.8 mmol/L — ABNORMAL HIGH (ref 0.0–2.0)
Bicarbonate: 33 mmol/L — ABNORMAL HIGH (ref 20.0–28.0)
O2 Saturation: 51.1 %
Patient temperature: 98.6
pCO2, Ven: 67.6 mmHg — ABNORMAL HIGH (ref 44.0–60.0)
pH, Ven: 7.31 (ref 7.250–7.430)
pO2, Ven: 30.7 mmHg — CL (ref 32.0–45.0)

## 2020-05-06 LAB — URINALYSIS, ROUTINE W REFLEX MICROSCOPIC
Bacteria, UA: NONE SEEN
Bilirubin Urine: NEGATIVE
Glucose, UA: NEGATIVE mg/dL
Ketones, ur: NEGATIVE mg/dL
Leukocytes,Ua: NEGATIVE
Nitrite: NEGATIVE
Protein, ur: 30 mg/dL — AB
Specific Gravity, Urine: 1.003 — ABNORMAL LOW (ref 1.005–1.030)
pH: 6 (ref 5.0–8.0)

## 2020-05-06 LAB — I-STAT CHEM 8, ED
BUN: 11 mg/dL (ref 6–20)
Calcium, Ion: 1.07 mmol/L — ABNORMAL LOW (ref 1.15–1.40)
Chloride: 95 mmol/L — ABNORMAL LOW (ref 98–111)
Creatinine, Ser: 1.6 mg/dL — ABNORMAL HIGH (ref 0.61–1.24)
Glucose, Bld: 106 mg/dL — ABNORMAL HIGH (ref 70–99)
HCT: 44 % (ref 39.0–52.0)
Hemoglobin: 15 g/dL (ref 13.0–17.0)
Potassium: 3.7 mmol/L (ref 3.5–5.1)
Sodium: 139 mmol/L (ref 135–145)
TCO2: 31 mmol/L (ref 22–32)

## 2020-05-06 LAB — BRAIN NATRIURETIC PEPTIDE: B Natriuretic Peptide: 34.6 pg/mL (ref 0.0–100.0)

## 2020-05-06 MED ORDER — ALBUTEROL SULFATE HFA 108 (90 BASE) MCG/ACT IN AERS
4.0000 | INHALATION_SPRAY | Freq: Once | RESPIRATORY_TRACT | Status: AC
  Administered 2020-05-06: 4 via RESPIRATORY_TRACT
  Filled 2020-05-06: qty 6.7

## 2020-05-06 MED ORDER — IPRATROPIUM BROMIDE HFA 17 MCG/ACT IN AERS
2.0000 | INHALATION_SPRAY | Freq: Once | RESPIRATORY_TRACT | Status: AC
  Administered 2020-05-06: 2 via RESPIRATORY_TRACT
  Filled 2020-05-06: qty 12.9

## 2020-05-06 MED ORDER — AEROCHAMBER Z-STAT PLUS/MEDIUM MISC
1.0000 | Freq: Once | Status: AC
  Administered 2020-05-06: 1
  Filled 2020-05-06: qty 1

## 2020-05-06 NOTE — ED Provider Notes (Signed)
[facility] DEPT Provider Note  CSN: [id number] Arrival date & time: 05/05/20 2349  Chief Complaint(s) Shortness of Breath and Alcohol Intoxication  ED Triage Notes Sanjuana Mae, RN (Registered Nurse) . [facility] . [facility] Date of Service: 05/06/2020 12:10 AM . . Signed   Pt presents from home via [facility], c/o SOB x 3 days, also reports drinking 4-5 beers tonight. EMS states patient was 87% on RA, during triage patient is 95% on RA. Denies medical hx      HPI Alan Eaton is a 45 y.o. male patient reports for an episode of shortness of breath.  Reports that these have been coming on for several weeks.  Reports that they are related to anxiety.  The anxiety is related to the death of his father last month.  Reports that the shortness of breath last for several minutes.  At times they are mild but the one from tonight was more severe.  Reports that his shortness of breath is now resolved.  He is normally able to relieve his shortness of breath by calming down.  He denied any associated chest pain.  No nausea or vomiting.  No recent fevers or chills.  No coughing or congestion.  He did admit to alcohol consumption and reports that he has been drinking more since this father passed away.  Reported to have sats in 80%s by EMS.  HPI  Past Medical History Past Medical History:  Diagnosis Date  . Alcohol abuse   . Bronchitis   . Depression   . Diabetes mellitus without complication ([place])    lost weight diet mgmt at present  . Drug abuse ([place])   . Family history of adverse reaction to anesthesia   . GERD (gastroesophageal reflux disease)   . Gout   . Hypertension   . Morbid obesity ([place])   . Suicide ideation    Patient Active Problem List   Diagnosis Date Noted  . 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 MDD (major depressive disorder), severe ([place]) 10/31/2018  . Tachycardia 04/19/2016  . Factitious  disorder 01/13/2016  . Unspecified depressive disorder 01/13/2016  . Alcohol use disorder, severe, dependence ([place]) 01/13/2016  . 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 HTN (hypertension) 01/05/2016  . Gout 01/05/2016  . GERD (gastroesophageal reflux disease) 01/05/2016  . Diabetes ([place]) 01/05/2016  . Tobacco use disorder 01/05/2016  . Obesity 10/27/2015   Home Medication(s) Prior to Admission medications   Medication Sig Start Date End Date Taking? Authorizing Provider  albuterol (VENTOLIN HFA) 108 (90 Base) MCG/ACT inhaler Inhale 2 puffs into the lungs every 4 (four) hours as needed for wheezing or shortness of breath. 11/21/18  Yes Clapacs, Madie Reno, MD  allopurinol (ZYLOPRIM) 100 MG tablet Take 100 mg by mouth every morning.   Yes [provider]  allopurinol (ZYLOPRIM) 300 MG tablet Take 300 mg by mouth every evening.   Yes [provider]  amLODipine (NORVASC) 5 MG tablet Take 5 mg by mouth daily.   Yes [provider]  cloNIDine (CATAPRES) 0.1 MG tablet Take 0.1 mg by mouth 2 (two) times daily.   Yes [provider]  furosemide (LASIX) 40 MG tablet Take 40 mg by mouth daily.   Yes [provider]  gabapentin (NEURONTIN) 300 MG capsule TAKE 1 CAPSULE BY MOUTH 3 TIMES DAILY Patient taking differently: Take 300 mg by mouth 3 (three) times daily. 01/08/19  Yes Clapacs, Madie Reno, MD  hydrOXYzine (  ATARAX/VISTARIL) 50 MG tablet Take 50 mg by mouth daily.   Yes [provider]  metoprolol tartrate (LOPRESSOR) 50 MG tablet Take 50 mg by mouth daily. 04/29/20  Yes [provider]  NIFEdipine (PROCARDIA XL/NIFEDICAL XL) 60 MG 24 hr tablet Take 1 tablet (60 mg total) by mouth daily. 11/21/18  Yes Clapacs, Madie Reno, MD  pantoprazole (PROTONIX) 40 MG tablet Take 1 tablet (40 mg total) by mouth daily. 11/22/18  Yes Clapacs, Madie Reno, MD  tamsulosin (FLOMAX) 0.4 MG CAPS capsule Take 0.4 mg by mouth daily. 04/29/20  Yes [provider]  traZODone  (DESYREL) 50 MG tablet TAKE 1 TABLET BY MOUTH AT BEDTIME AS NEEDED SLEEP Patient taking differently: Take 50 mg by mouth at bedtime as needed for sleep. 03/02/19  Yes Clapacs, Madie Reno, MD  vitamin B-12 (CYANOCOBALAMIN) 1000 MCG tablet Take 1 tablet (1,000 mcg total) by mouth daily. 11/21/18  Yes Clapacs, Madie Reno, MD  metoprolol succinate (TOPROL-XL) 50 MG 24 hr tablet TAKE 1 TABLET BY MOUTH ONCE DAILY 03/16/19   Clapacs, Madie Reno, MD                                                                                                                                    Past Surgical History Past Surgical History:  Procedure Laterality Date  . none    . TRICEPS TENDON REPAIR Right 09/26/2018   Procedure: Debridement and primary repair of partial-thickness right distal triceps tendon tear. ;  Surgeon: Corky Mull, MD;  Location: [facility] ORS;  Service: Orthopedics;  Laterality: Right;  . UPPER GASTROINTESTINAL ENDOSCOPY     Family History Family History  Problem Relation Age of Onset  . Hypertension Other   . Diabetes Mellitus II Other     Social History Social History   Tobacco Use  . Smoking status: Current Every Day Smoker    Packs/day: 1.00    Types: Cigarettes  . Smokeless tobacco: Current User    Types: Snuff  Vaping Use  . Vaping Use: Some days  Substance Use Topics  . Alcohol use: Not Currently    Alcohol/week: 0.0 standard drinks    Comment: not for 6 months  . Drug use: Yes    Frequency: 1.0 times per week    Types: Marijuana, Cocaine    Comment: not recently (quitting)   Allergies Sulfamethoxazole-trimethoprim and Bee venom  Review of Systems Review of Systems All other systems are reviewed and are negative for acute change except as noted in the HPI  Physical Exam Vital Signs  I have reviewed the triage vital signs BP (!) 148/92 (BP Location: Right Arm)   Pulse (!) 107   Temp 98.7 F (37.1 C) (Oral)   Resp 18   SpO2 94%   Physical Exam Vitals reviewed.   Constitutional:      General: He is not in acute distress.    Appearance: He is  well-developed and well-nourished. He is obese. He is not diaphoretic.  HENT:     Head: Normocephalic and atraumatic.     Nose: Nose normal.  Eyes:     General: No scleral icterus.       Right eye: No discharge.        Left eye: No discharge.     Extraocular Movements: EOM normal.     Conjunctiva/sclera: Conjunctivae normal.     Pupils: Pupils are equal, round, and reactive to light.  Cardiovascular:     Rate and Rhythm: Normal rate and regular rhythm.     Heart sounds: No murmur heard. No friction rub. No gallop.   Pulmonary:     Effort: Pulmonary effort is normal. No respiratory distress.     Breath sounds: Normal breath sounds. No stridor. No rales.  Abdominal:     General: There is no distension.     Palpations: Abdomen is soft.     Tenderness: There is no abdominal tenderness.  Musculoskeletal:        General: No tenderness or edema.     Cervical back: Normal range of motion and neck supple.  Skin:    General: Skin is warm and dry.     Findings: No erythema or rash.  Neurological:     Mental Status: He is alert and oriented to person, place, and time.  Psychiatric:        Mood and Affect: Mood and affect normal.     ED Results and Treatments Labs (all labs ordered are listed, but only abnormal results are displayed) Labs Reviewed  COMPREHENSIVE METABOLIC PANEL - Abnormal; Notable for the following components:      Result Value   Chloride 96 (*)    Glucose, Bld 107 (*)    Total Protein 8.4 (*)    ALT 49 (*)    All other components within normal limits  URINALYSIS, ROUTINE W REFLEX MICROSCOPIC - Abnormal; Notable for the following components:   Color, Urine STRAW (*)    Specific Gravity, Urine 1.003 (*)    Hgb urine dipstick SMALL (*)    Protein, ur 30 (*)    All other components within normal limits  BLOOD GAS, VENOUS - Abnormal; Notable for the following components:   pCO2,  Ven 67.6 (*)    pO2, Ven 30.7 (*)    Bicarbonate 33.0 (*)    Acid-Base Excess 4.8 (*)    All other components within normal limits  I-STAT CHEM 8, ED - Abnormal; Notable for the following components:   Chloride 95 (*)    Creatinine, Ser 1.60 (*)    Glucose, Bld 106 (*)    Calcium, Ion 1.07 (*)    All other components within normal limits  CBC WITH DIFFERENTIAL/PLATELET  BRAIN NATRIURETIC PEPTIDE                                                                                                                         EKG  EKG Interpretation  Date/Time:  Tuesday May 06 2020 01:17:35 EST Ventricular Rate:  105 PR Interval:    QRS Duration: 81 QT Interval:  329 QTC Calculation: 435 R Axis:   62 Text Interpretation: Sinus tachycardia Low voltage, precordial leads Anteroseptal infarct, old No acute changes Confirmed by Addison Lank [phone]) on 05/06/2020 1:43:09 AM      Radiology  Result time 05/06/20 00:48:43 Final result by Bella Kennedy, MD (05/06/20 00:48:43)           Narrative:   CLINICAL DATA: Shortness of breath   EXAM:  PORTABLE CHEST 1 VIEW   COMPARISON: 11/22/2018   FINDINGS:  Shallow lung inflation without focal airspace consolidation or overt  pulmonary edema. Central pulmonary vascular congestion. No  pneumothorax or sizable pleural effusion.   IMPRESSION:  Shallow lung inflation and central pulmonary vascular congestion.           Medications Ordered in ED Medications  albuterol (VENTOLIN HFA) 108 (90 Base) MCG/ACT inhaler 4 puff (4 puffs Inhalation Given 05/06/20 0056)  ipratropium (ATROVENT HFA) inhaler 2 puff (2 puffs Inhalation Given 05/06/20 0056)  aerochamber Z-Stat Plus/medium 1 each (1 each Other Given 05/06/20 0056)                                                                                                                                    Procedures Procedures  (including critical care time)  Medical Decision Making / ED  Course I have reviewed the nursing notes for this encounter and the patient's prior records (if available in EHR or on provided paperwork).   Crews Mccollam was evaluated in Emergency Department on 05/07/2020 for the symptoms described in the history of present illness. He was evaluated in the context of the global COVID-19 pandemic, which necessitated consideration that the patient might be at risk for infection with the SARS-CoV-2 virus that causes COVID-19. Institutional protocols and algorithms that pertain to the evaluation of patients at risk for COVID-19 are in a state of rapid change based on information released by regulatory bodies including the CDC and federal and state organizations. These policies and algorithms were followed during the patient's care in the ED.  SOB related to anxiety. No resolved. satting well on RA. Chest x-ray without evidence suggestive of pneumonia, pneumothorax, pneumomediastinum.  No abnormal contour of the mediastinum to suggest dissection. No evidence of acute injuries. Labs reassuring without leukocytosis or anemia.  No significant electrolyte derangement or renal sufficiency. Work-up is not consistent with heart failure. Doubt ACS or PE.  Patient eloped prior to updating him on results      Final Clinical Impression(s) / ED Diagnoses Final diagnoses:  SOB (shortness of breath)      This chart was dictated using voice recognition software.  Despite best efforts to proofread,  errors can occur which can change the documentation meaning.   Fatima Blank, MD 05/07/20 [phone]

## 2020-05-06 NOTE — ED Notes (Signed)
The patient pushed the call bell because he had a BM on himself and wanted staff to clean him up Patient directed to bathroom as he is AxOx4 and ambulatory without assistance Patient continues to take monitor leads off after repeatedly being asked to leave them on

## 2020-05-06 NOTE — ED Notes (Signed)
Date and time results received: 05/06/20 0129 (use smartphrase ".now" to insert current time)  Test: pO2 Critical Value: 30.7  Name of Provider Notified: Leonette Monarch, MD  Orders Received? Or Actions Taken?: Orders Received - See Orders for details

## 2020-05-06 NOTE — ED Notes (Signed)
Patient requested this RN take out his peripheral IV Patient states he is leaving without his paperwork Message sent to MD in an attempt to provide discharge paperwork to patient prior to him leaving

## 2020-05-06 NOTE — ED Triage Notes (Signed)
Pt presents [facility] via [facility], c/o SOB x 3 days, also reports drinking 4-5 beers tonight. EMS states patient was 87% on RA, during triage patient is 95% on RA. Denies medical hx

## 2020-07-30 ENCOUNTER — Other Ambulatory Visit: Payer: Self-pay

## 2020-07-30 ENCOUNTER — Emergency Department (HOSPITAL_COMMUNITY): Payer: Medicare Other

## 2020-07-30 ENCOUNTER — Encounter (HOSPITAL_COMMUNITY): Payer: Self-pay | Admitting: Emergency Medicine

## 2020-07-30 ENCOUNTER — Emergency Department (HOSPITAL_COMMUNITY)
Admission: EM | Admit: 2020-07-30 | Discharge: 2020-07-30 | Disposition: A | Discharge status: Home / Self Care | Payer: Medicare Other | Attending: Emergency Medicine | Admitting: Emergency Medicine

## 2020-07-30 DIAGNOSIS — R059 Cough, unspecified: Secondary | ICD-10-CM | POA: Insufficient documentation

## 2020-07-30 DIAGNOSIS — R600 Localized edema: Secondary | ICD-10-CM | POA: Insufficient documentation

## 2020-07-30 DIAGNOSIS — R0602 Shortness of breath: Secondary | ICD-10-CM | POA: Diagnosis present

## 2020-07-30 DIAGNOSIS — Z79899 Other long term (current) drug therapy: Secondary | ICD-10-CM | POA: Diagnosis not present

## 2020-07-30 DIAGNOSIS — F17210 Nicotine dependence, cigarettes, uncomplicated: Secondary | ICD-10-CM | POA: Insufficient documentation

## 2020-07-30 DIAGNOSIS — E119 Type 2 diabetes mellitus without complications: Secondary | ICD-10-CM | POA: Diagnosis not present

## 2020-07-30 DIAGNOSIS — Z20822 Contact with and (suspected) exposure to covid-19: Secondary | ICD-10-CM | POA: Insufficient documentation

## 2020-07-30 DIAGNOSIS — R0981 Nasal congestion: Secondary | ICD-10-CM | POA: Diagnosis not present

## 2020-07-30 DIAGNOSIS — I10 Essential (primary) hypertension: Secondary | ICD-10-CM | POA: Insufficient documentation

## 2020-07-30 DIAGNOSIS — R0600 Dyspnea, unspecified: Secondary | ICD-10-CM

## 2020-07-30 LAB — RESP PANEL BY RT-PCR (FLU A&B, COVID) ARPGX2
Influenza A by PCR: NEGATIVE
Influenza B by PCR: NEGATIVE
SARS Coronavirus 2 by RT PCR: NEGATIVE

## 2020-07-30 LAB — CBC WITH DIFFERENTIAL/PLATELET
Abs Immature Granulocytes: 0.02 10*3/uL (ref 0.00–0.07)
Basophils Absolute: 0 10*3/uL (ref 0.0–0.1)
Basophils Relative: 0 %
Eosinophils Absolute: 0.4 10*3/uL (ref 0.0–0.5)
Eosinophils Relative: 5 %
HCT: 41.7 % (ref 39.0–52.0)
Hemoglobin: 13.5 g/dL (ref 13.0–17.0)
Immature Granulocytes: 0 %
Lymphocytes Relative: 24 %
Lymphs Abs: 2.1 10*3/uL (ref 0.7–4.0)
MCH: 30.3 pg (ref 26.0–34.0)
MCHC: 32.4 g/dL (ref 30.0–36.0)
MCV: 93.7 fL (ref 80.0–100.0)
Monocytes Absolute: 0.7 10*3/uL (ref 0.1–1.0)
Monocytes Relative: 8 %
Neutro Abs: 5.2 10*3/uL (ref 1.7–7.7)
Neutrophils Relative %: 63 %
Platelets: 250 10*3/uL (ref 150–400)
RBC: 4.45 MIL/uL (ref 4.22–5.81)
RDW: 14.6 % (ref 11.5–15.5)
WBC: 8.5 10*3/uL (ref 4.0–10.5)
nRBC: 0 % (ref 0.0–0.2)

## 2020-07-30 LAB — COMPREHENSIVE METABOLIC PANEL
ALT: 51 U/L — ABNORMAL HIGH (ref 0–44)
AST: 41 U/L (ref 15–41)
Albumin: 4 g/dL (ref 3.5–5.0)
Alkaline Phosphatase: 74 U/L (ref 38–126)
Anion gap: 11 (ref 5–15)
BUN: 11 mg/dL (ref 6–20)
CO2: 26 mmol/L (ref 22–32)
Calcium: 8.8 mg/dL — ABNORMAL LOW (ref 8.9–10.3)
Chloride: 95 mmol/L — ABNORMAL LOW (ref 98–111)
Creatinine, Ser: 1.17 mg/dL (ref 0.61–1.24)
GFR, Estimated: >60 mL/min (ref >60)
Glucose, Bld: 105 mg/dL — ABNORMAL HIGH (ref 70–99)
Potassium: 4 mmol/L (ref 3.5–5.1)
Sodium: 132 mmol/L — ABNORMAL LOW (ref 135–145)
Total Bilirubin: <0.1 mg/dL — ABNORMAL LOW (ref 0.3–1.2)
Total Protein: 8.3 g/dL — ABNORMAL HIGH (ref 6.5–8.1)

## 2020-07-30 LAB — BRAIN NATRIURETIC PEPTIDE: B Natriuretic Peptide: 38.3 pg/mL (ref 0.0–100.0)

## 2020-07-30 LAB — TROPONIN I (HIGH SENSITIVITY): Troponin I (High Sensitivity): 3 ng/L (ref <18)

## 2020-07-30 NOTE — ED Triage Notes (Signed)
Pt from home with c/o SOB x2  Months that got worse today inhaler ineffective

## 2020-07-30 NOTE — Discharge Instructions (Signed)
Return to the emergency department if you develop worsening breathing, severe chest pain, or other new and concerning symptoms.

## 2020-07-30 NOTE — ED Provider Notes (Signed)
[facility] DEPT Provider Note   CSN: [id number] Arrival date & time: 07/30/20  0057     History Chief Complaint  Patient presents with  . Shortness of Breath    X2 months worse tonight    Tahjai Schetter is a 45 y.o. male.  Patient is a 45 year old male with history of morbid obesity, diabetes, GERD, depression.  Patient presenting today for evaluation of shortness of breath.  Patient states that this has been present for the past 2 months, but became significantly worse today.  EMS was called and was found to have low oxygen saturations.  He was then transported here.  Patient reports recent congestion and cough.  He denies fevers or chills.  He denies chest pain.  He does report some leg swelling that seems to be a chronic issue.  The history is provided by the patient.  Shortness of Breath Severity:  Moderate Onset quality:  Gradual Duration:  2 months Timing:  Constant Progression:  Worsening Chronicity:  New Context: activity and URI   Relieved by:  Nothing Worsened by:  Nothing Ineffective treatments:  None tried      Past Medical History:  Diagnosis Date  . Alcohol abuse   . Bronchitis   . Depression   . Diabetes mellitus without complication ([place])    lost weight diet mgmt at present  . Drug abuse ([place])   . Family history of adverse reaction to anesthesia   . GERD (gastroesophageal reflux disease)   . Gout   . Hypertension   . Morbid obesity ([place])   . Suicide ideation     Patient Active Problem List   Diagnosis Date Noted  . 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 MDD (major depressive disorder), severe ([place]) 10/31/2018  . Tachycardia 04/19/2016  . Factitious disorder 01/13/2016  . Unspecified depressive disorder 01/13/2016  . Alcohol use disorder, severe, dependence ([place]) 01/13/2016  . 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 HTN (hypertension) 01/05/2016  . Gout 01/05/2016  .  GERD (gastroesophageal reflux disease) 01/05/2016  . Diabetes ([place]) 01/05/2016  . Tobacco use disorder 01/05/2016  . Obesity 10/27/2015    Past Surgical History:  Procedure Laterality Date  . none    . TRICEPS TENDON REPAIR Right 09/26/2018   Procedure: Debridement and primary repair of partial-thickness right distal triceps tendon tear. ;  Surgeon: Corky Mull, MD;  Location: [facility] ORS;  Service: Orthopedics;  Laterality: Right;  . UPPER GASTROINTESTINAL ENDOSCOPY         Family History  Problem Relation Age of Onset  . Hypertension Other   . Diabetes Mellitus II Other     Social History   Tobacco Use  . Smoking status: Current Every Day Smoker    Packs/day: 1.00    Types: Cigarettes  . Smokeless tobacco: Current User    Types: Snuff  Vaping Use  . Vaping Use: Some days  Substance Use Topics  . Alcohol use: Not Currently    Alcohol/week: 0.0 standard drinks    Comment: not for 6 months  . Drug use: Yes    Frequency: 1.0 times per week    Types: Marijuana, Cocaine    Comment: not recently (quitting)    Home Medications Prior to Admission medications   Medication Sig Start Date End Date Taking? Authorizing Provider  albuterol (VENTOLIN HFA) 108 (90 Base) MCG/ACT inhaler Inhale 2 puffs into the lungs every 4 (four) hours as needed for wheezing or shortness of  breath. 11/21/18   Clapacs, Madie Reno, MD  allopurinol (ZYLOPRIM) 100 MG tablet Take 100 mg by mouth every morning.    [provider]  allopurinol (ZYLOPRIM) 300 MG tablet Take 300 mg by mouth every evening.    [provider]  amLODipine (NORVASC) 5 MG tablet Take 5 mg by mouth daily.    [provider]  cloNIDine (CATAPRES) 0.1 MG tablet Take 0.1 mg by mouth 2 (two) times daily.    [provider]  furosemide (LASIX) 40 MG tablet Take 40 mg by mouth daily.    [provider]  gabapentin (NEURONTIN) 300 MG capsule TAKE 1 CAPSULE BY MOUTH 3 TIMES DAILY Patient taking  differently: Take 300 mg by mouth 3 (three) times daily. 01/08/19   Clapacs, Madie Reno, MD  hydrOXYzine (ATARAX/VISTARIL) 50 MG tablet Take 50 mg by mouth daily.    [provider]  metoprolol succinate (TOPROL-XL) 50 MG 24 hr tablet TAKE 1 TABLET BY MOUTH ONCE DAILY 03/16/19   Clapacs, Madie Reno, MD  metoprolol tartrate (LOPRESSOR) 50 MG tablet Take 50 mg by mouth daily. 04/29/20   [provider]  NIFEdipine (PROCARDIA XL/NIFEDICAL XL) 60 MG 24 hr tablet Take 1 tablet (60 mg total) by mouth daily. 11/21/18   Clapacs, Madie Reno, MD  pantoprazole (PROTONIX) 40 MG tablet Take 1 tablet (40 mg total) by mouth daily. 11/22/18   Clapacs, Madie Reno, MD  tamsulosin (FLOMAX) 0.4 MG CAPS capsule Take 0.4 mg by mouth daily. 04/29/20   [provider]  traZODone (DESYREL) 50 MG tablet TAKE 1 TABLET BY MOUTH AT BEDTIME AS NEEDED SLEEP Patient taking differently: Take 50 mg by mouth at bedtime as needed for sleep. 03/02/19   Clapacs, Madie Reno, MD  vitamin B-12 (CYANOCOBALAMIN) 1000 MCG tablet Take 1 tablet (1,000 mcg total) by mouth daily. 11/21/18   Clapacs, Madie Reno, MD    Allergies    Sulfamethoxazole-trimethoprim and Bee venom  Review of Systems   Review of Systems  Respiratory: Positive for shortness of breath.   All other systems reviewed and are negative.   Physical Exam Updated Vital Signs BP (!) 162/100   Pulse (!) 106   Temp 98.3 F (36.8 C) (Oral)   Resp (!) 37   SpO2 93%   Physical Exam Vitals and nursing note reviewed.  Constitutional:      General: He is not in acute distress.    Appearance: He is well-developed. He is not diaphoretic.  HENT:     Head: Normocephalic and atraumatic.  Cardiovascular:     Rate and Rhythm: Normal rate and regular rhythm.     Heart sounds: No murmur heard. No friction rub.  Pulmonary:     Effort: Pulmonary effort is normal. No respiratory distress.     Breath sounds: Examination of the right-lower field reveals rales. Examination of the  left-lower field reveals rales. Rales present. No wheezing.  Abdominal:     General: Bowel sounds are normal. There is no distension.     Palpations: Abdomen is soft.     Tenderness: There is no abdominal tenderness.  Musculoskeletal:        General: Normal range of motion.     Cervical back: Normal range of motion and neck supple.     Right lower leg: No tenderness. Edema present.     Left lower leg: No tenderness. Edema present.     Comments: There is 1+ pitting edema both lower extremities.  Skin:  General: Skin is warm and dry.  Neurological:     Mental Status: He is alert and oriented to person, place, and time.     Coordination: Coordination normal.     ED Results / Procedures / Treatments   Labs (all labs ordered are listed, but only abnormal results are displayed) Labs Reviewed  RESP PANEL BY RT-PCR (FLU A&B, COVID) [id number]  COMPREHENSIVE METABOLIC PANEL  BRAIN NATRIURETIC PEPTIDE  CBC WITH DIFFERENTIAL/PLATELET  TROPONIN I (HIGH SENSITIVITY)    EKG EKG Interpretation  Date/Time:  Wednesday Jul 30 2020 01:48:51 EDT Ventricular Rate:  109 PR Interval:  146 QRS Duration: 82 QT Interval:  316 QTC Calculation: 426 R Axis:   73 Text Interpretation: Sinus tachycardia Low voltage, precordial leads Otherwise normal ECG Confirmed by Veryl Speak [phone]) on 07/30/2020 1:51:35 AM   Radiology No results found.  Procedures Procedures   Medications Ordered in ED Medications - No data to display  ED Course  I have reviewed the triage vital signs and the nursing notes.  Pertinent labs & imaging results that were available during my care of the patient were reviewed by me and considered in my medical decision making (see chart for details).    MDM Rules/Calculators/A&P  Patient brought to the ER by EMS for evaluation of difficulty breathing.  He reports a 3-month history of worsening dyspnea that became worse tonight.  Patient arrives here afebrile with stable  vital signs with oxygen saturations in the low to mid 90s.  Work-up initiated including laboratory studies, chest x-ray, and EKG.  These have returned and are essentially unremarkable.  Patient did seem somewhat dyspneic initially upon presentation, however later was found sitting up in his room and demanding to go home.  I returned to the room to discuss the results of his test and the disposition.  He was insistent upon leaving.  Nothing in the work-up is significantly abnormal.  His BNP is normal, troponin is negative, and blood counts unremarkable.  COVID testing is negative and chest x-ray shows cardiomegaly with mild central vascular congestion, but no evidence for infiltrate.  Patient discharged upon his request after being informed of the results of his test results.  Patient to return as needed if symptoms worsen or change.  Final Clinical Impression(s) / ED Diagnoses Final diagnoses:  None    Rx / DC Orders ED Discharge Orders    None       Veryl Speak, MD 07/30/20 0422

## 2021-01-08 ENCOUNTER — Encounter (HOSPITAL_COMMUNITY): Payer: Self-pay | Admitting: Emergency Medicine

## 2021-01-08 ENCOUNTER — Emergency Department (HOSPITAL_COMMUNITY)
Admission: EM | Admit: 2021-01-08 | Discharge: 2021-01-09 | Discharge status: Against Medical Advice | Payer: Medicare Other | Attending: Emergency Medicine | Admitting: Emergency Medicine

## 2021-01-08 DIAGNOSIS — Z79899 Other long term (current) drug therapy: Secondary | ICD-10-CM | POA: Insufficient documentation

## 2021-01-08 DIAGNOSIS — K625 Hemorrhage of anus and rectum: Secondary | ICD-10-CM | POA: Diagnosis not present

## 2021-01-08 DIAGNOSIS — F17210 Nicotine dependence, cigarettes, uncomplicated: Secondary | ICD-10-CM | POA: Diagnosis not present

## 2021-01-08 DIAGNOSIS — Z20822 Contact with and (suspected) exposure to covid-19: Secondary | ICD-10-CM | POA: Diagnosis not present

## 2021-01-08 DIAGNOSIS — I10 Essential (primary) hypertension: Secondary | ICD-10-CM | POA: Diagnosis not present

## 2021-01-08 DIAGNOSIS — E119 Type 2 diabetes mellitus without complications: Secondary | ICD-10-CM | POA: Insufficient documentation

## 2021-01-08 LAB — CBC WITH DIFFERENTIAL/PLATELET
Abs Immature Granulocytes: 0.09 10*3/uL — ABNORMAL HIGH (ref 0.00–0.07)
Basophils Absolute: 0 10*3/uL (ref 0.0–0.1)
Basophils Relative: 0 %
Eosinophils Absolute: 0.4 10*3/uL (ref 0.0–0.5)
Eosinophils Relative: 4 %
HCT: 40.3 % (ref 39.0–52.0)
Hemoglobin: 13.3 g/dL (ref 13.0–17.0)
Immature Granulocytes: 1 %
Lymphocytes Relative: 22 %
Lymphs Abs: 2.2 10*3/uL (ref 0.7–4.0)
MCH: 31.2 pg (ref 26.0–34.0)
MCHC: 33 g/dL (ref 30.0–36.0)
MCV: 94.6 fL (ref 80.0–100.0)
Monocytes Absolute: 0.8 10*3/uL (ref 0.1–1.0)
Monocytes Relative: 8 %
Neutro Abs: 6.5 10*3/uL (ref 1.7–7.7)
Neutrophils Relative %: 65 %
Platelets: 245 10*3/uL (ref 150–400)
RBC: 4.26 MIL/uL (ref 4.22–5.81)
RDW: 14.9 % (ref 11.5–15.5)
WBC: 10 10*3/uL (ref 4.0–10.5)
nRBC: 0.2 % (ref 0.0–0.2)

## 2021-01-08 LAB — COMPREHENSIVE METABOLIC PANEL
ALT: 67 U/L — ABNORMAL HIGH (ref 0–44)
AST: 63 U/L — ABNORMAL HIGH (ref 15–41)
Albumin: 4 g/dL (ref 3.5–5.0)
Alkaline Phosphatase: 58 U/L (ref 38–126)
Anion gap: 9 (ref 5–15)
BUN: 12 mg/dL (ref 6–20)
CO2: 29 mmol/L (ref 22–32)
Calcium: 9.2 mg/dL (ref 8.9–10.3)
Chloride: 98 mmol/L (ref 98–111)
Creatinine, Ser: 1.56 mg/dL — ABNORMAL HIGH (ref 0.61–1.24)
GFR, Estimated: 55 mL/min — ABNORMAL LOW (ref >60)
Glucose, Bld: 116 mg/dL — ABNORMAL HIGH (ref 70–99)
Potassium: 2.9 mmol/L — ABNORMAL LOW (ref 3.5–5.1)
Sodium: 136 mmol/L (ref 135–145)
Total Bilirubin: 0.5 mg/dL (ref 0.3–1.2)
Total Protein: 8.5 g/dL — ABNORMAL HIGH (ref 6.5–8.1)

## 2021-01-08 LAB — ABO/RH: ABO/RH(D): A POS

## 2021-01-08 LAB — RESP PANEL BY RT-PCR (FLU A&B, COVID) ARPGX2
Influenza A by PCR: NEGATIVE
Influenza B by PCR: NEGATIVE
SARS Coronavirus 2 by RT PCR: NEGATIVE

## 2021-01-08 LAB — POC OCCULT BLOOD, ED: Fecal Occult Bld: POSITIVE — AB

## 2021-01-08 LAB — PROTIME-INR
INR: 1 (ref 0.8–1.2)
Prothrombin Time: 12.7 seconds (ref 11.4–15.2)

## 2021-01-08 LAB — TYPE AND SCREEN
ABO/RH(D): A POS
Antibody Screen: NEGATIVE

## 2021-01-08 LAB — LIPASE, BLOOD: Lipase: 71 U/L — ABNORMAL HIGH (ref 11–51)

## 2021-01-08 NOTE — ED Notes (Signed)
Dark green and blue save tube in main lab

## 2021-01-08 NOTE — ED Triage Notes (Addendum)
Pt reports "excessive" rectal bleeding for a few days. Sent by PCP. ETOH on board.

## 2021-01-08 NOTE — ED Notes (Signed)
Pt state to Probation officer he did have a few beers before coming. Triage RN and PA have been made aware

## 2021-01-08 NOTE — ED Provider Notes (Signed)
[facility] DEPT Provider Note   CSN: [id number] Arrival date & time: 01/08/21  2119     History Chief Complaint  Patient presents with   Rectal Bleeding    Quantavis Obryant is a 45 y.o. male.  Patient presents to the emergency department with a chief complaint of rectal bleeding.  He reports having significant rectal bleeding for the past couple of days.  He reports some epigastric abdominal pain.  He denies any fevers or chills.  Denies nausea, vomiting, or diarrhea.  Denies use of any blood thinners.  He does have hx of alcohol abuse.  The history is provided by the patient. No language interpreter was used.      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   MDD (major depressive disorder), severe ([place]) 10/31/2018   Tachycardia 04/19/2016   Factitious disorder 01/13/2016   Unspecified depressive disorder 01/13/2016   Alcohol use disorder, severe, dependence ([place]) 01/13/2016   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 ;  Surgeon: Corky Mull, MD;  Location: [facility] ORS;  Service: Orthopedics;  Laterality: Right;   UPPER  GASTROINTESTINAL ENDOSCOPY         Family History  Problem Relation Age of Onset   Hypertension Other    Diabetes Mellitus II Other     Social History   Tobacco Use   Smoking status: Every Day    Packs/day: 1.00    Types: Cigarettes   Smokeless tobacco: Current    Types: Snuff  Vaping Use   Vaping Use: Some days  Substance Use Topics   Alcohol use: Not Currently    Alcohol/week: 0.0 standard drinks    Comment: not for 6 months   Drug use: Yes    Frequency: 1.0 times per week    Types: Marijuana, Cocaine    Comment: not recently (quitting)    Home Medications Prior to Admission medications   Medication Sig Start Date End Date Taking? Authorizing Provider  albuterol (VENTOLIN HFA) 108 (90 Base) MCG/ACT inhaler Inhale 2 puffs into the lungs every 4 (four) hours as needed for wheezing or shortness of breath. 11/21/18   Clapacs, Madie Reno, MD  allopurinol (ZYLOPRIM) 300 MG tablet Take 300 mg by mouth every evening.    [provider]  amLODipine (NORVASC) 5 MG tablet Take 5 mg by mouth daily.    [provider]  cloNIDine (CATAPRES) 0.1 MG tablet Take 0.1 mg by mouth 2 (two) times daily.    [provider]  furosemide (LASIX) 40 MG tablet Take 40 mg by mouth daily.  [provider]  gabapentin (NEURONTIN) 300 MG capsule TAKE 1 CAPSULE BY MOUTH 3 TIMES DAILY Patient taking differently: Take 300 mg by mouth 3 (three) times daily. 01/08/19   Clapacs, Madie Reno, MD  hydrOXYzine (ATARAX/VISTARIL) 50 MG tablet Take 50 mg by mouth daily.    [provider]  metoprolol succinate (TOPROL-XL) 50 MG 24 hr tablet TAKE 1 TABLET BY MOUTH ONCE DAILY 03/16/19   Clapacs, Madie Reno, MD  metoprolol tartrate (LOPRESSOR) 50 MG tablet Take 50 mg by mouth daily. 04/29/20   [provider]  NIFEdipine (PROCARDIA XL/NIFEDICAL XL) 60 MG 24 hr tablet Take 1 tablet (60 mg total) by mouth daily. 11/21/18   Clapacs, Madie Reno, MD  pantoprazole (PROTONIX) 40 MG tablet  Take 1 tablet (40 mg total) by mouth daily. 11/22/18   Clapacs, Madie Reno, MD  tamsulosin (FLOMAX) 0.4 MG CAPS capsule Take 0.4 mg by mouth daily. 04/29/20   [provider]  vitamin B-12 (CYANOCOBALAMIN) 1000 MCG tablet Take 1 tablet (1,000 mcg total) by mouth daily. 11/21/18   Clapacs, Madie Reno, MD    Allergies    Sulfamethoxazole-trimethoprim and Bee venom  Review of Systems   Review of Systems  All other systems reviewed and are negative.  Physical Exam Updated Vital Signs BP (!) 184/129   Pulse (!) 113   Temp 97.7 F (36.5 C) (Oral)   Resp 18   SpO2 93%   Physical Exam Vitals and nursing note reviewed.  Constitutional:      Appearance: He is well-developed.  HENT:     Head: Normocephalic and atraumatic.  Eyes:     Conjunctiva/sclera: Conjunctivae normal.  Cardiovascular:     Rate and Rhythm: Normal rate and regular rhythm.     Heart sounds: No murmur heard. Pulmonary:     Effort: Pulmonary effort is normal. No respiratory distress.     Breath sounds: Normal breath sounds.  Abdominal:     Palpations: Abdomen is soft.     Tenderness: There is no abdominal tenderness.  Genitourinary:    Comments: Gross blood on DRE, but no active hemorrhage Musculoskeletal:        General: Normal range of motion.     Cervical back: Neck supple.  Skin:    General: Skin is warm and dry.  Neurological:     Mental Status: He is alert and oriented to person, place, and time.  Psychiatric:        Mood and Affect: Mood normal.        Behavior: Behavior normal.    ED Results / Procedures / Treatments   Labs (all labs ordered are listed, but only abnormal results are displayed) Labs Reviewed  COMPREHENSIVE METABOLIC PANEL - Abnormal; Notable for the following components:      Result Value   Potassium 2.9 (*)    Glucose, Bld 116 (*)    Creatinine, Ser 1.56 (*)    Total Protein 8.5 (*)    AST 63 (*)    ALT 67 (*)    GFR, Estimated 55 (*)    All other components within normal  limits  CBC WITH DIFFERENTIAL/PLATELET - Abnormal; Notable for the following components:   Abs Immature Granulocytes 0.09 (*)    All other components within normal limits  LIPASE, BLOOD - Abnormal; Notable for the following components:   Lipase 71 (*)    All other components within normal limits  RESP PANEL BY RT-PCR (FLU A&B, COVID) ARPGX2  TYPE AND SCREEN  ABO/RH    EKG None  Radiology No results found.  Procedures Procedures   Medications Ordered in ED Medications - No data to display  ED Course  I have reviewed the triage vital signs and the nursing notes.  Pertinent labs & imaging results that were available during my care of the patient were reviewed by me and considered in my medical decision making (see chart for details).    MDM Rules/Calculators/A&P                           Patient here with grossly bloody stools.  Has blood on rectal exam.  Not hemorrhaging.  Will check labs and CT.  Hemoglobin is normal.  CT angio shows no active bleeding.  Given patient's presentation, I am still concerned about GI bleed.  I feel that he would benefit most from GI consultation and admission to the hospital.  He is noted to be tachycardic.  Will give fluids.  No certain history of esophageal varices.  3:55 AM I was called to the bedside, the patient asks to be discharged.   Patients that they would like to leave.  I have discussed my concerns with the patient about them leaving without completing the evaluation.   Patient has capacity to make own medical decisions.  Patient presents with GI bleed  Symptoms include: Tachycardia and abdominal pain  Concern for: Upper or lower GI bleed  Study limitations and other tests offered include: Offered admission and GI consultation  Treatment and recommended follow-up include: Omeprazole  I do not feel that the patient should leave prior to completing their workup. I have discussed the above symptoms, initial findings,  study limitations, and treatment plan with the patient. Patient is not altered, does not have any distracting issues, and has capacity to make decisions for themselves. Patient places themselves at risk of worsening bleeding, worsening symptoms, disability, morbidity and/or death.  Final Clinical Impression(s) / ED Diagnoses Final diagnoses:  Rectal bleeding    Rx / DC Orders ED Discharge Orders     None        Montine Circle, PA-C 01/09/21 0356    Malvin Johns, MD 01/10/21 1452

## 2021-01-08 NOTE — ED Provider Notes (Signed)
Emergency Medicine Provider Triage Evaluation Note  Clydell Sposito , a 45 y.o. male  was evaluated in triage.  Pt complains of rectal bleeding and abdominal pain.  Patient reports that he has had rectal bleeding over the last 2 to 3 days.  Reports that bleeding is present without bowel movements.  Patient does have pain with defecation.  Denies any known hemorrhoids.  Patient denies any melena.  Patient also endorses periumbilical abdominal pain.  Denies any nausea, vomiting, constipation, diarrhea, fevers, chills, dysuria.    Review of Systems  Positive: Rectal bleeding, pain with defecation, abdominal pain Negative: Nausea, vomiting, constipation, diarrhea, fevers, chills, dysuria  Physical Exam  BP (!) 184/129   Pulse (!) 113   Temp 97.7 F (36.5 C) (Oral)   Resp 18   SpO2 93%  Gen:   Awake, no distress   Resp:  Normal effort  MSK:   Moves extremities without difficulty  Other:  Abdomen soft, nondistended, nontender.  Medical Decision Making  Medically screening exam initiated at 9:44 PM.  Appropriate orders placed.  Murl Golladay was informed that the remainder of the evaluation will be completed by another provider, this initial triage assessment does not replace that evaluation, and the importance of remaining in the ED until their evaluation is complete.     Loni Beckwith, PA-C 01/08/21 2148    Malvin Johns, MD 01/08/21 2202

## 2021-01-09 ENCOUNTER — Encounter (HOSPITAL_COMMUNITY): Payer: Self-pay

## 2021-01-09 ENCOUNTER — Emergency Department (HOSPITAL_COMMUNITY): Payer: Medicare Other

## 2021-01-09 MED ORDER — OMEPRAZOLE 20 MG PO CPDR: 20.0000 mg | DELAYED_RELEASE_CAPSULE | Freq: Every day | ORAL | 0 refills | Status: DC

## 2021-01-09 MED ORDER — ONDANSETRON HCL 4 MG/2ML IJ SOLN
INTRAMUSCULAR | Status: AC
  Filled 2021-01-09: qty 2

## 2021-01-09 MED ORDER — IOHEXOL 350 MG/ML SOLN
80.0000 mL | Freq: Once | INTRAVENOUS | Status: AC | PRN
  Administered 2021-01-09: 80 mL via INTRAVENOUS

## 2021-01-09 MED ORDER — SODIUM CHLORIDE 0.9 % IV BOLUS: 1000.0000 mL | Freq: Once | INTRAVENOUS | Status: DC

## 2021-01-09 NOTE — Discharge Instructions (Addendum)
It is my recommendation that you should remain in the hospital.  Due to your gastrointestinal bleeding, you set yourself at risk for severe bleeding, infection, disability, and death by leaving the hospital.  You can return at any time.  I recommend that you follow-up with a gastroenterologist.

## 2021-07-02 IMAGING — CR DG HUMERUS 2V *R*
1 series · 4 of 4 positions shown · non-contrast
Comparison: None.

CLINICAL DATA: Onset right upper arm pain today. Patient heard a
pop in his arm when he was caring heavy bottles today. History of
triceps surgery 09/26/2018.

EXAM:
RIGHT HUMERUS - 2+ VIEW

[Series 1: dg humerus right · 0.14mm/px · 4 of 4 slices shown]
[im 1/4]
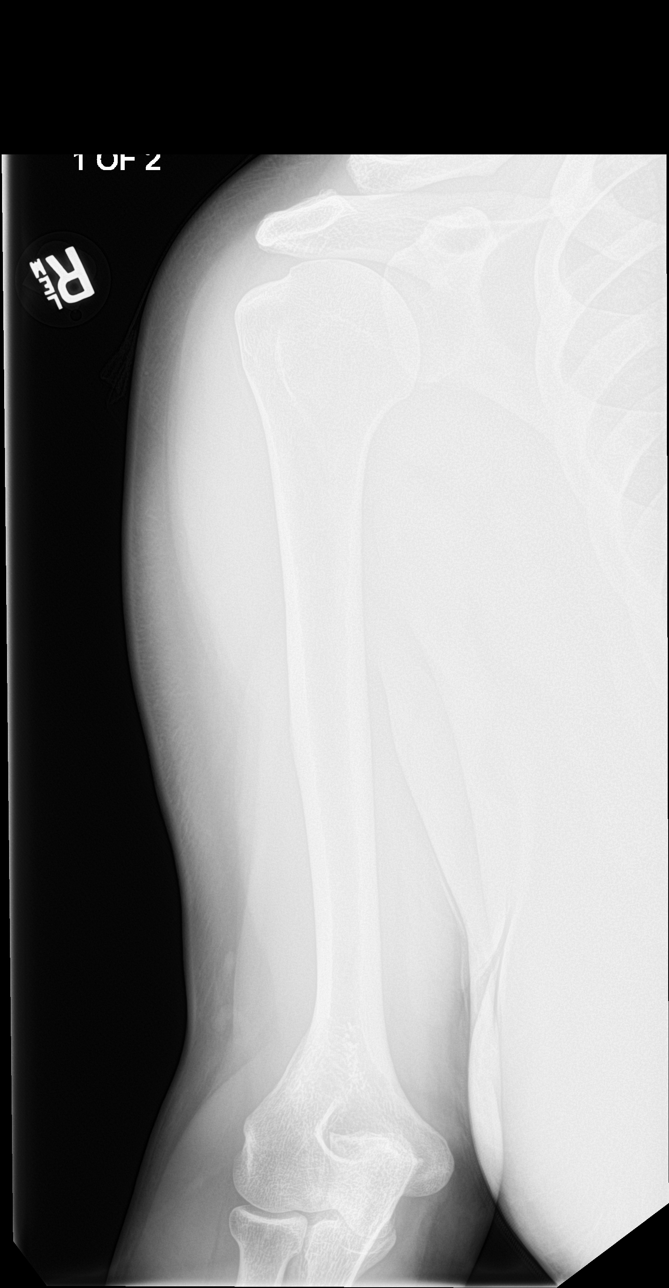
[im 2/4]
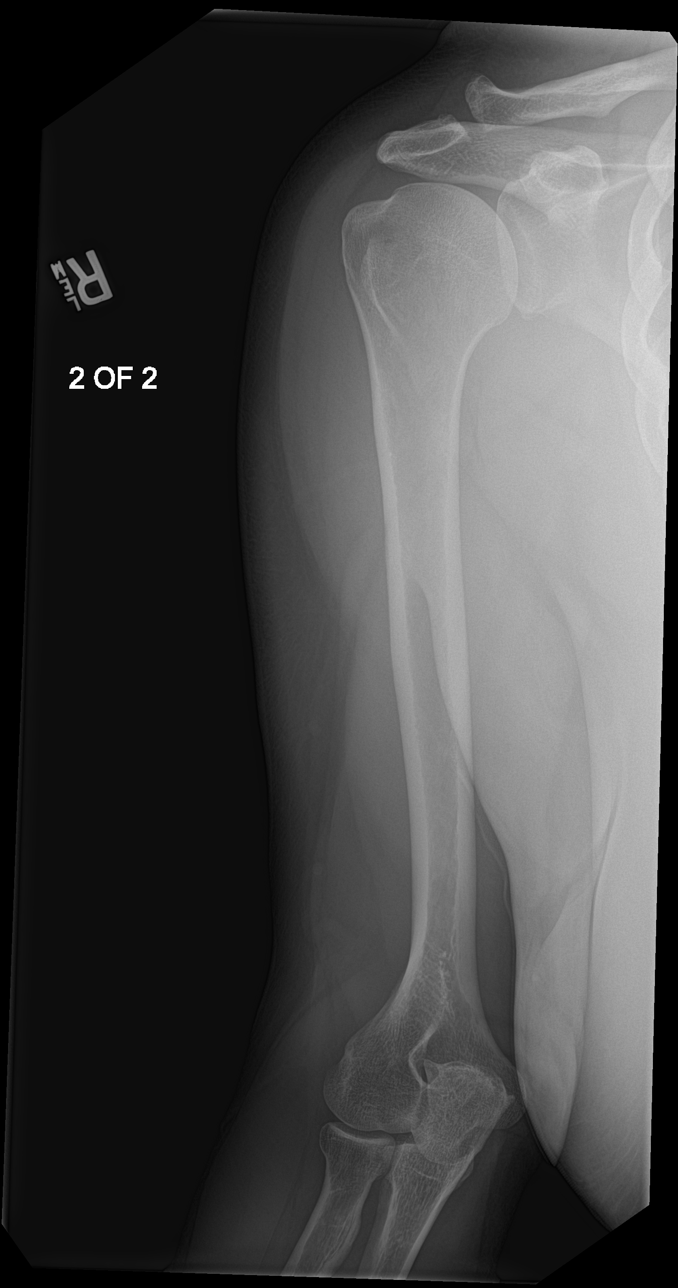
[im 3/4]
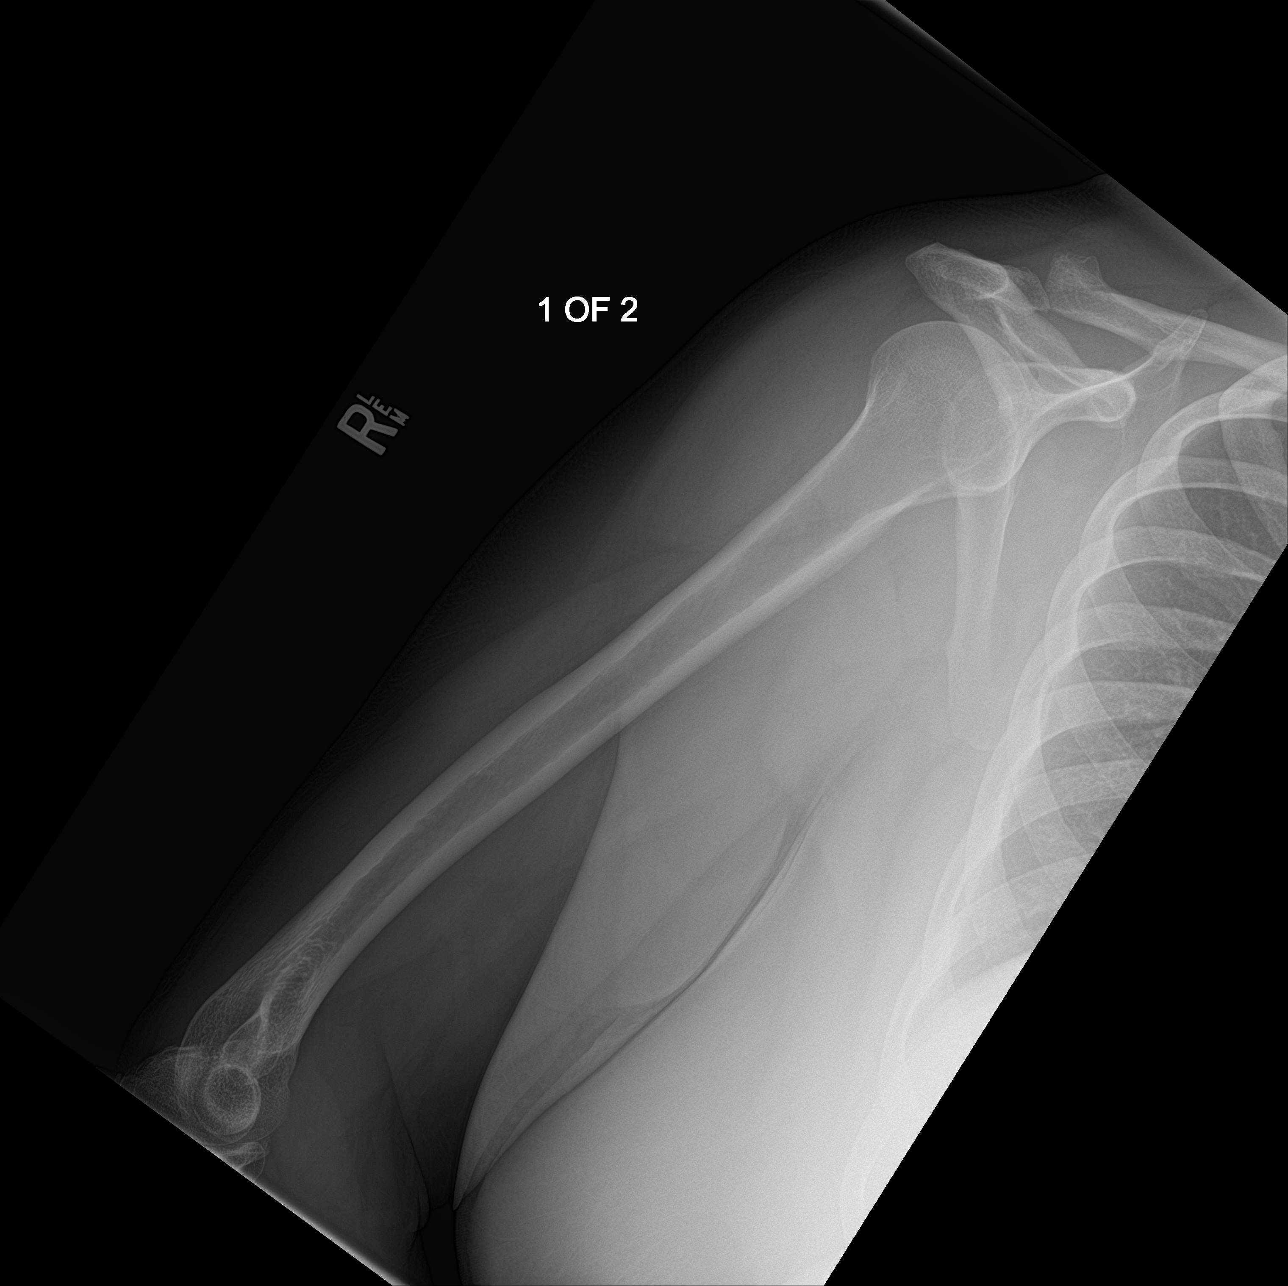
[im 4/4]
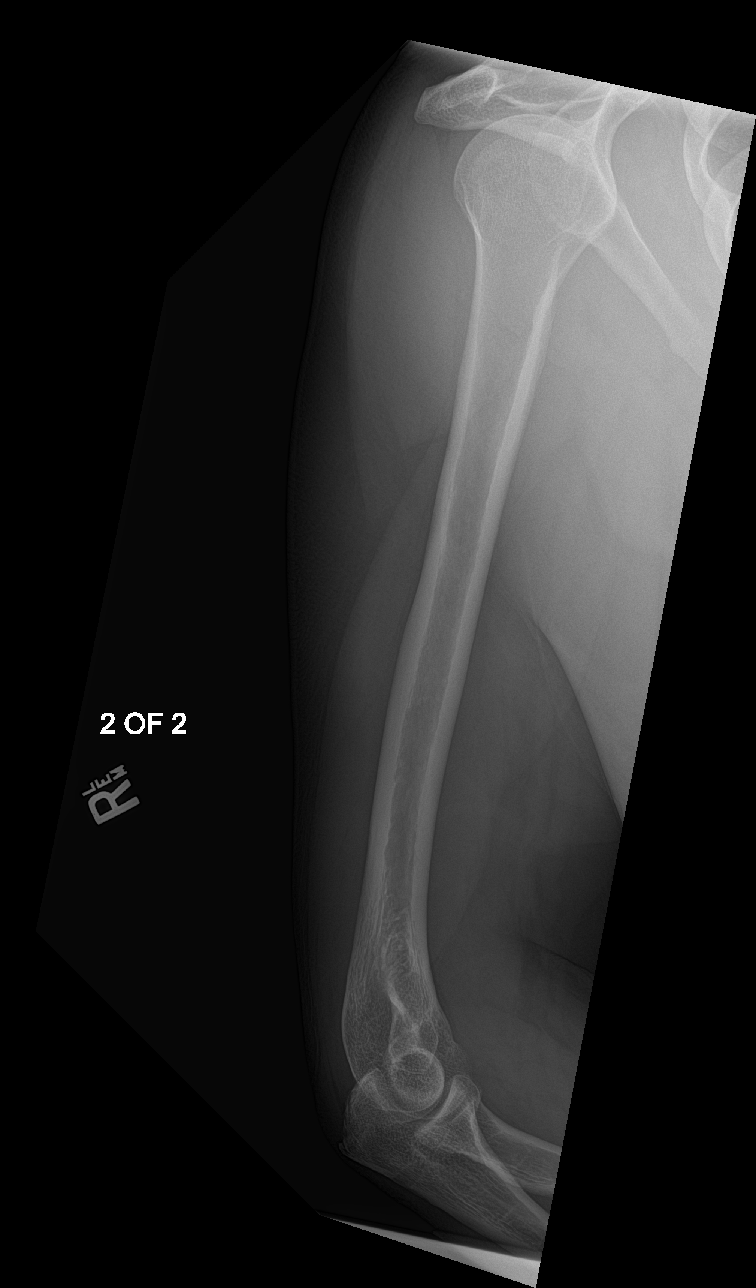

[4 of 4 positions shown; findings below may reference images not displayed]

FINDINGS: There is no evidence of fracture or other focal bone lesions. Soft
tissues are unremarkable.
IMPRESSION: Negative exam.

## 2021-11-04 ENCOUNTER — Other Ambulatory Visit: Payer: Self-pay | Admitting: Surgery

## 2021-11-13 ENCOUNTER — Encounter
Admission: RE | Admit: 2021-11-13 | Discharge: 2021-11-13 | Disposition: A | Discharge status: Home / Self Care | Payer: Medicare Other | Source: Ambulatory Visit | Attending: Surgery | Admitting: Surgery

## 2021-11-13 ENCOUNTER — Other Ambulatory Visit: Payer: Self-pay

## 2021-11-13 DIAGNOSIS — I10 Essential (primary) hypertension: Secondary | ICD-10-CM

## 2021-11-13 DIAGNOSIS — Z01812 Encounter for preprocedural laboratory examination: Secondary | ICD-10-CM

## 2021-11-13 HISTORY — DX: Pneumonia, unspecified organism: J18.9

## 2021-11-13 HISTORY — DX: Personal history of urinary calculi: Z87.442

## 2021-11-13 HISTORY — DX: Anxiety disorder, unspecified: F41.9

## 2021-11-13 NOTE — Patient Instructions (Addendum)
Your procedure is scheduled on: 11/25/21 - Wednesday Report to the Registration Desk on the 1st floor of the [facility]. To find out your arrival time, please call [phone] between 1PM - 3PM on: 11/24/21 - Tuesday If your arrival time is 6:00 am, do not arrive prior to that time as the [facility] entrance doors do not open until 6:00 am.  REMEMBER: Instructions that are not followed completely may result in serious medical risk, up to and including death; or upon the discretion of your surgeon and anesthesiologist your surgery may need to be rescheduled.  Do not eat food after midnight the night before surgery.  No gum chewing, lozengers or hard candies.  You may however, drink CLEAR liquids up to 2 hours before you are scheduled to arrive for your surgery. Do not drink anything within 2 hours of your scheduled arrival time.  Clear liquids include: - water  Type 1 and Type 2 diabetics should only drink water.  In addition, your doctor has ordered for you to drink the provided  Gatorade G2 Drinking this carbohydrate drink up to two hours before surgery helps to reduce insulin resistance and improve patient outcomes. Please complete drinking 2 hours prior to scheduled arrival time.  TAKE THESE MEDICATIONS THE MORNING OF SURGERY WITH A SIP OF WATER:  - amLODipine (NORVASC) - gabapentin (NEURONTIN) - metoprolol succinate (TOPROL-XL)  - tamsulosin (FLOMAX)   Use albuterol (VENTOLIN HFA) 108 (90 Base) on the day of surgery and bring to the hospital.  Hold OZEMPIC 7 days prior to your surgery, may resume after surgery.  Hold Aspirin on the day of surgery.  One week prior to surgery: Stop Anti-inflammatories (NSAIDS) such as Advil, Aleve, Ibuprofen, Motrin, Naproxen, Naprosyn and Aspirin based products such as Excedrin, Goodys Powder, BC Powder.  Stop ANY OVER THE COUNTER supplements until after surgery.  You may however, continue to take Tylenol if needed for pain up until  the day of surgery.  No Alcohol for 24 hours before or after surgery.  No Smoking including e-cigarettes for 24 hours prior to surgery.  No chewable tobacco products for at least 6 hours prior to surgery.  No nicotine patches on the day of surgery.  Do not use any "recreational" drugs for at least a week prior to your surgery.  Please be advised that the combination of cocaine and anesthesia may have negative outcomes, up to and including death. If you test positive for cocaine, your surgery will be cancelled.  On the morning of surgery brush your teeth with toothpaste and water, you may rinse your mouth with mouthwash if you wish. Do not swallow any toothpaste or mouthwash.  Use CHG Soap or wipes as directed on instruction sheet.  Do not wear jewelry, make-up, hairpins, clips or nail polish.  Do not wear lotions, powders, or perfumes.   Do not shave body from the neck down 48 hours prior to surgery just in case you cut yourself which could leave a site for infection.  Also, freshly shaved skin may become irritated if using the CHG soap.  Contact lenses, hearing aids and dentures may not be worn into surgery.  Do not bring valuables to the hospital. [facility] is not responsible for any missing/lost belongings or valuables.   Notify your doctor if there is any change in your medical condition (cold, fever, infection).  Wear comfortable clothing (specific to your surgery type) to the hospital.  After surgery, you can help prevent lung complications by  doing breathing exercises.  Take deep breaths and cough every 1-2 hours. Your doctor may order a device called an Incentive Spirometer to help you take deep breaths. When coughing or sneezing, hold a pillow firmly against your incision with both hands. This is called "splinting." Doing this helps protect your incision. It also decreases belly discomfort.  If you are being admitted to the hospital overnight, leave your suitcase in the  car. After surgery it may be brought to your room.  If you are being discharged the day of surgery, you will not be allowed to drive home. You will need a responsible adult (18 years or older) to drive you home and stay with you that night.   If you are taking public transportation, you will need to have a responsible adult (18 years or older) with you. Please confirm with your physician that it is acceptable to use public transportation.   Please call the [facility] Dept. at [phone] if you have any questions about these instructions.  Surgery Visitation Policy:  Patients undergoing a surgery or procedure may have two family members or support persons with them as long as the person is not COVID-19 positive or experiencing its symptoms.   Inpatient Visitation:    Visiting hours are 7 a.m. to 8 p.m. Up to four visitors are allowed at one time in a patient room, including children. The visitors may rotate out with other people during the day. One designated support person (adult) may remain overnight.

## 2021-11-16 ENCOUNTER — Encounter
Admission: RE | Admit: 2021-11-16 | Discharge: 2021-11-16 | Disposition: A | Discharge status: Home / Self Care | Payer: Medicare Other | Source: Ambulatory Visit | Attending: Surgery | Admitting: Surgery

## 2021-11-16 DIAGNOSIS — Z01810 Encounter for preprocedural cardiovascular examination: Secondary | ICD-10-CM | POA: Insufficient documentation

## 2021-11-16 DIAGNOSIS — I10 Essential (primary) hypertension: Secondary | ICD-10-CM

## 2021-11-16 DIAGNOSIS — R000 Tachycardia, unspecified: Secondary | ICD-10-CM | POA: Insufficient documentation

## 2021-11-16 DIAGNOSIS — Z01812 Encounter for preprocedural laboratory examination: Secondary | ICD-10-CM

## 2021-11-25 ENCOUNTER — Encounter: Payer: Self-pay | Admitting: Surgery

## 2021-11-25 ENCOUNTER — Ambulatory Visit
Admission: RE | Admit: 2021-11-25 | Discharge: 2021-11-25 | Disposition: A | Discharge status: Home / Self Care | Payer: Medicare Other | Attending: Surgery | Admitting: Surgery

## 2021-11-25 ENCOUNTER — Other Ambulatory Visit: Payer: Self-pay

## 2021-11-25 ENCOUNTER — Ambulatory Visit: Payer: Medicare Other | Admitting: General Practice

## 2021-11-25 ENCOUNTER — Encounter
Admission: RE | Disposition: A | Discharge status: Home / Self Care | Payer: Self-pay | Source: Home / Self Care | Attending: Surgery

## 2021-11-25 DIAGNOSIS — F17220 Nicotine dependence, chewing tobacco, uncomplicated: Secondary | ICD-10-CM | POA: Diagnosis not present

## 2021-11-25 DIAGNOSIS — G4730 Sleep apnea, unspecified: Secondary | ICD-10-CM | POA: Diagnosis not present

## 2021-11-25 DIAGNOSIS — Z6838 Body mass index (BMI) 38.0-38.9, adult: Secondary | ICD-10-CM | POA: Diagnosis not present

## 2021-11-25 DIAGNOSIS — K219 Gastro-esophageal reflux disease without esophagitis: Secondary | ICD-10-CM | POA: Diagnosis not present

## 2021-11-25 DIAGNOSIS — J45909 Unspecified asthma, uncomplicated: Secondary | ICD-10-CM | POA: Diagnosis not present

## 2021-11-25 DIAGNOSIS — E119 Type 2 diabetes mellitus without complications: Secondary | ICD-10-CM | POA: Diagnosis not present

## 2021-11-25 DIAGNOSIS — Z01812 Encounter for preprocedural laboratory examination: Secondary | ICD-10-CM

## 2021-11-25 DIAGNOSIS — M7021 Olecranon bursitis, right elbow: Secondary | ICD-10-CM | POA: Insufficient documentation

## 2021-11-25 DIAGNOSIS — Z79899 Other long term (current) drug therapy: Secondary | ICD-10-CM | POA: Insufficient documentation

## 2021-11-25 DIAGNOSIS — I10 Essential (primary) hypertension: Secondary | ICD-10-CM | POA: Insufficient documentation

## 2021-11-25 DIAGNOSIS — Z7985 Long-term (current) use of injectable non-insulin antidiabetic drugs: Secondary | ICD-10-CM | POA: Insufficient documentation

## 2021-11-25 HISTORY — PX: OLECRANON BURSECTOMY: SHX2097

## 2021-11-25 LAB — GLUCOSE, CAPILLARY
Glucose-Capillary: 116 mg/dL — ABNORMAL HIGH (ref 70–99)
Glucose-Capillary: 139 mg/dL — ABNORMAL HIGH (ref 70–99)

## 2021-11-25 SURGERY — BURSECTOMY, ELBOW
Anesthesia: General | Site: Elbow | Laterality: Right

## 2021-11-25 MED ORDER — CHLORHEXIDINE GLUCONATE 0.12 % MT SOLN: 15.0000 mL | Freq: Once | OROMUCOSAL | Status: AC

## 2021-11-25 MED ORDER — OXYCODONE HCL 5 MG/5ML PO SOLN: 5.0000 mg | Freq: Once | ORAL | Status: DC | PRN

## 2021-11-25 MED ORDER — FENTANYL CITRATE (PF) 100 MCG/2ML IJ SOLN: 25.0000 ug | INTRAMUSCULAR | Status: DC | PRN

## 2021-11-25 MED ORDER — IPRATROPIUM-ALBUTEROL 0.5-2.5 (3) MG/3ML IN SOLN
RESPIRATORY_TRACT | Status: AC
  Filled 2021-11-25: qty 3

## 2021-11-25 MED ORDER — PHENYLEPHRINE HCL (PRESSORS) 10 MG/ML IV SOLN
INTRAVENOUS | Status: DC | PRN
  Administered 2021-11-25 (×2): 240 ug via INTRAVENOUS

## 2021-11-25 MED ORDER — CEFAZOLIN IN SODIUM CHLORIDE 3-0.9 GM/100ML-% IV SOLN
3.0000 g | INTRAVENOUS | Status: AC
  Administered 2021-11-25: 3 g via INTRAVENOUS
  Filled 2021-11-25: qty 100

## 2021-11-25 MED ORDER — BUPIVACAINE HCL (PF) 0.5 % IJ SOLN
INTRAMUSCULAR | Status: AC
  Filled 2021-11-25: qty 30

## 2021-11-25 MED ORDER — OXYCODONE HCL 5 MG PO TABS: 5.0000 mg | ORAL_TABLET | Freq: Once | ORAL | Status: DC | PRN

## 2021-11-25 MED ORDER — IPRATROPIUM-ALBUTEROL 0.5-2.5 (3) MG/3ML IN SOLN
3.0000 mL | Freq: Once | RESPIRATORY_TRACT | Status: AC
  Administered 2021-11-25: 3 mL via RESPIRATORY_TRACT

## 2021-11-25 MED ORDER — ONDANSETRON HCL 4 MG/2ML IJ SOLN
INTRAMUSCULAR | Status: DC | PRN
  Administered 2021-11-25: 4 mg via INTRAVENOUS

## 2021-11-25 MED ORDER — ACETAMINOPHEN 10 MG/ML IV SOLN
INTRAVENOUS | Status: DC | PRN
  Administered 2021-11-25: 1000 mg via INTRAVENOUS

## 2021-11-25 MED ORDER — ORAL CARE MOUTH RINSE
15.0000 mL | Freq: Once | OROMUCOSAL | Status: AC
Start: 2021-11-25 — End: 2021-11-25

## 2021-11-25 MED ORDER — FAMOTIDINE 20 MG PO TABS: 20.0000 mg | ORAL_TABLET | Freq: Once | ORAL | Status: AC

## 2021-11-25 MED ORDER — BUPIVACAINE HCL 0.5 % IJ SOLN
INTRAMUSCULAR | Status: DC | PRN
  Administered 2021-11-25: 10 mL

## 2021-11-25 MED ORDER — FAMOTIDINE 20 MG PO TABS
ORAL_TABLET | ORAL | Status: AC
  Administered 2021-11-25: 20 mg via ORAL
  Filled 2021-11-25: qty 1

## 2021-11-25 MED ORDER — LIDOCAINE HCL (CARDIAC) PF 100 MG/5ML IV SOSY
PREFILLED_SYRINGE | INTRAVENOUS | Status: DC | PRN
  Administered 2021-11-25: 100 mg via INTRAVENOUS

## 2021-11-25 MED ORDER — PROPOFOL 10 MG/ML IV BOLUS
INTRAVENOUS | Status: DC | PRN
  Administered 2021-11-25: 200 mg via INTRAVENOUS

## 2021-11-25 MED ORDER — SUCCINYLCHOLINE CHLORIDE 200 MG/10ML IV SOSY
PREFILLED_SYRINGE | INTRAVENOUS | Status: DC | PRN
  Administered 2021-11-25: 120 mg via INTRAVENOUS

## 2021-11-25 MED ORDER — FENTANYL CITRATE (PF) 100 MCG/2ML IJ SOLN
INTRAMUSCULAR | Status: DC | PRN
  Administered 2021-11-25 (×2): 50 ug via INTRAVENOUS

## 2021-11-25 MED ORDER — MIDAZOLAM HCL 2 MG/2ML IJ SOLN
INTRAMUSCULAR | Status: AC
  Filled 2021-11-25: qty 2

## 2021-11-25 MED ORDER — VASOPRESSIN 20 UNIT/ML IV SOLN
INTRAVENOUS | Status: DC | PRN
  Administered 2021-11-25: 2 [IU] via INTRAVENOUS

## 2021-11-25 MED ORDER — FENTANYL CITRATE (PF) 100 MCG/2ML IJ SOLN
INTRAMUSCULAR | Status: AC
  Filled 2021-11-25: qty 2

## 2021-11-25 MED ORDER — ACETAMINOPHEN 10 MG/ML IV SOLN
INTRAVENOUS | Status: AC
Start: 2021-11-25 — End: ?
  Filled 2021-11-25: qty 100

## 2021-11-25 MED ORDER — CHLORHEXIDINE GLUCONATE 0.12 % MT SOLN
OROMUCOSAL | Status: AC
  Administered 2021-11-25: 15 mL via OROMUCOSAL
  Filled 2021-11-25: qty 15

## 2021-11-25 MED ORDER — SODIUM CHLORIDE 0.9 % IV SOLN: INTRAVENOUS | Status: DC

## 2021-11-25 SURGICAL SUPPLY — 40 items
APL PRP STRL LF DISP 70% ISPRP (MISCELLANEOUS) ×2
BNDG CMPR 5X4 CHSV STRCH STRL (GAUZE/BANDAGES/DRESSINGS) ×2
BNDG COHESIVE 4X5 TAN STRL LF (GAUZE/BANDAGES/DRESSINGS) ×1 IMPLANT
BNDG ESMARK 4X12 TAN STRL LF (GAUZE/BANDAGES/DRESSINGS) ×1 IMPLANT
CHLORAPREP W/TINT 26 (MISCELLANEOUS) ×1 IMPLANT
CUFF TOURN SGL QUICK 18X4 (TOURNIQUET CUFF) IMPLANT
CUFF TOURN SGL QUICK 24 (TOURNIQUET CUFF)
CUFF TRNQT CYL 24X4X16.5-23 (TOURNIQUET CUFF) IMPLANT
DRAPE 3/4 80X56 (DRAPES) ×1 IMPLANT
ELECT CAUTERY BLADE 6.4 (BLADE) ×1 IMPLANT
ELECT REM PT RETURN 9FT ADLT (ELECTROSURGICAL) ×1
ELECTRODE REM PT RTRN 9FT ADLT (ELECTROSURGICAL) ×1 IMPLANT
GAUZE SPONGE 4X4 12PLY STRL (GAUZE/BANDAGES/DRESSINGS) ×1 IMPLANT
GAUZE XEROFORM 1X8 LF (GAUZE/BANDAGES/DRESSINGS) ×1 IMPLANT
GLOVE BIO SURGEON STRL SZ8 (GLOVE) ×2 IMPLANT
GLOVE BIOGEL M 7.0 STRL (GLOVE) ×2 IMPLANT
GLOVE SURG UNDER LTX SZ8 (GLOVE) ×2 IMPLANT
GLOVE SURG UNDER POLY LF SZ7.5 (GLOVE) ×1 IMPLANT
GOWN STRL REUS W/ TWL LRG LVL3 (GOWN DISPOSABLE) ×1 IMPLANT
GOWN STRL REUS W/ TWL XL LVL3 (GOWN DISPOSABLE) ×1 IMPLANT
GOWN STRL REUS W/TWL LRG LVL3 (GOWN DISPOSABLE) ×1
GOWN STRL REUS W/TWL XL LVL3 (GOWN DISPOSABLE) ×1
KIT TURNOVER KIT A (KITS) ×1 IMPLANT
MANIFOLD NEPTUNE II (INSTRUMENTS) ×1 IMPLANT
NDL FILTER BLUNT 18X1 1/2 (NEEDLE) ×1 IMPLANT
NEEDLE FILTER BLUNT 18X 1/2SAF (NEEDLE) ×1
NEEDLE FILTER BLUNT 18X1 1/2 (NEEDLE) ×1 IMPLANT
NS IRRIG 500ML POUR BTL (IV SOLUTION) ×1 IMPLANT
PACK EXTREMITY ARMC (MISCELLANEOUS) ×1 IMPLANT
PAD ABD DERMACEA PRESS 5X9 (GAUZE/BANDAGES/DRESSINGS) ×1 IMPLANT
PAD CAST 4YDX4 CTTN HI CHSV (CAST SUPPLIES) ×1 IMPLANT
PADDING CAST COTTON 4X4 STRL (CAST SUPPLIES)
SPONGE T-LAP 18X18 ~~LOC~~+RFID (SPONGE) ×1 IMPLANT
STOCKINETTE IMPERVIOUS 9X36 MD (GAUZE/BANDAGES/DRESSINGS) ×1 IMPLANT
STRIP CLOSURE SKIN 1/2X4 (GAUZE/BANDAGES/DRESSINGS) ×1 IMPLANT
SUT VIC AB 2-0 SH 27 (SUTURE) ×1
SUT VIC AB 2-0 SH 27XBRD (SUTURE) ×1 IMPLANT
SUT VIC AB 3-0 PS2 18 (SUTURE) ×1 IMPLANT
TRAP FLUID SMOKE EVACUATOR (MISCELLANEOUS) ×1 IMPLANT
WATER STERILE IRR 500ML POUR (IV SOLUTION) ×1 IMPLANT

## 2021-11-25 NOTE — Anesthesia Postprocedure Evaluation (Signed)
Anesthesia Post Note  Patient: Alan Eaton  Procedure(s) Performed: OPEN EXCISION OF RIGHT OLECRANON BURSA (Right: Elbow)  Patient location during evaluation: PACU Anesthesia Type: General Level of consciousness: awake and alert Pain management: pain level controlled Vital Signs Assessment: post-procedure vital signs reviewed and stable Respiratory status: spontaneous breathing, nonlabored ventilation, respiratory function stable and patient connected to nasal cannula oxygen Cardiovascular status: blood pressure returned to baseline and stable Postop Assessment: no apparent nausea or vomiting Anesthetic complications: yes Comments: Patient desaturated post extubation after waiting until the patient was awake and responding to commands before extubation. Oral airway and a nasal trumpet were placed and patient was masked and bagged back up to 100%. Patient started breathing spontaneously and was taken to PACU. His saturation was maintained in the 90%s on room air in the PACU. Patient was offered to stay a night in the hospital to monitor his breathing but patient declined and stated several times he wanted to go home. Instructed patient to go to the ER if he had any problems breathing. Patient stated he understood and agreed.   Encounter Notable Events  Notable Event Outcome Phase Comment  Difficult to intubate - expected  Intraprocedure Filed from anesthesia note documentation.     Last Vitals:  Vitals:   11/25/21 1653 11/25/21 1700  BP:  (!) 178/89  Pulse: (!) 103 (!) 109  Resp: 20 19  Temp:  36.4 C  SpO2: 99% 92%    Last Pain:  Vitals:   11/25/21 1700  TempSrc:   PainSc: 0-No pain                 Dimas Millin

## 2021-11-25 NOTE — H&P (Signed)
History of Present Illness:  Alan Eaton is a 46 y.o. male who has been referred by Rachelle Hora, PA-C, for evaluation and treatment of posterior right elbow pain and swelling. He notes the symptoms developed 2 months ago without any specific cause or injury. He saw Rachelle Hora, PA-C, who diagnosed him with olecranon bursitis. He performed an aspiration/injection which provided temporary partial relief of his symptoms. However, his symptoms recurred so he followed up with Rachelle Hora, PA-C. Because of these continued symptoms, the patient has been referred to me for discussion of further treatment options. The patient is known to me as he is now 3 years status post a repair of a partial tear of his right triceps tendon from which he has recovered well. The patient denies any recent injury to the elbow, and denies any fevers or chills. He also denies any numbness or paresthesias down his arm to his hand.  Current Outpatient Medications: albuterol 90 mcg/actuation inhaler Inhale 2 inhalations into the lungs every 6 (six) hours as needed for Wheezing 1 Inhaler 5  allopurinoL (ZYLOPRIM) 300 MG tablet Take 1 tablet (300 mg total) by mouth once daily 90 tablet 1  aspirin 81 MG chewable tablet Take 81 mg by mouth once daily  cyanocobalamin (VITAMIN B12) 1000 MCG tablet Take 1,000 mcg by mouth once daily  FUROsemide (LASIX) 40 MG tablet Take 1 tablet (40 mg total) by mouth once daily for 180 days 90 tablet 1  gabapentin (NEURONTIN) 300 MG capsule Take 1 capsule (300 mg total) by mouth 3 (three) times daily 270 capsule 1  hydrOXYzine (ATARAX) 50 MG tablet TAKE 1 TABLET BY MOUTH TWICE DAILY 60 tablet 10  ipratropium-albuteroL (DUO-NEB) nebulizer solution Take 3 mLs by nebulization 4 (four) times daily for 360 days 360 mL 5  metoprolol tartrate (LOPRESSOR) 50 MG tablet TAKE 1 TABLET (50 MG TOTAL) BY MOUTH ONCE DAILY 90 tablet 1  oxyCODONE-acetaminophen (PERCOCET) 5-325 mg tablet Take 1 tablet by mouth once  daily as needed for Pain 30 tablet 0  pantoprazole (PROTONIX) 40 MG DR tablet TAKE 1 TABLET BY MOUTH TWICE DAILY BEFORE MEALS 60 tablet 10  semaglutide (OZEMPIC) 0.25 mg or 0.5 mg(2 mg/1.5 mL) pen injector Inject 0.1875 mLs (0.25 mg total) subcutaneously once a week for 30 days 0.75 mL 5  tamsulosin (FLOMAX) 0.4 mg capsule Take 1 capsule (0.4 mg total) by mouth once daily 30 MINUTES AFTER SAME MEAL EACH DAY 90 capsule 1   Allergies:   Venom-Honey Bee Swelling  Sulfamethoxazole-Trimethoprim Rash and Swelling   Past Medical History:  Anxiety  Depression  GERD (gastroesophageal reflux disease)  Hypertension  Migraine  Nervous stomach   Past Surgical History:  Debridement and primary repair of partial thickness right distal triceps tendon tear Right 09/26/2018 (Dr. Roland Rack)   Family History:  High blood pressure (Hypertension) Mother  Diabetes type II Mother  High blood pressure (Hypertension) Father  Diabetes type II Father  Lung cancer Maternal Grandmother  Heart disease Maternal Grandmother  Heart disease Paternal Grandmother   Social History:   Socioeconomic History:  Marital status: Single  Tobacco Use  Smoking status: Every Day  Smokeless tobacco: Current  Types: Chew  Substance and Sexual Activity  Alcohol use: Yes  Comment: Socially  Drug use: Defer  Sexual activity: Defer   Review of Systems:  A comprehensive 14 point ROS was performed, reviewed, and the pertinent orthopaedic findings are documented in the HPI.  Physical Exam: Vitals:  10/30/21 1049  BP: 134/88  Weight: (!) 152.9 kg (337 lb)  Height: 182.9 cm (6')  PainSc: 10-Worst pain ever  PainLoc: Elbow   General/Constitutional: Pleasant significantly overweight middle-age male in no acute distress. Neuro/Psych: Normal mood and affect, oriented to person, place and time. Eyes: Non-icteric. Pupils are equal, round, and reactive to light, and exhibit synchronous movement. ENT: Unremarkable. Lymphatic:  No palpable adenopathy. Respiratory: Lungs clear to auscultation, Normal chest excursion, No wheezes, and Non-labored breathing Cardiovascular: Regular rate and rhythm. No murmurs. and No edema, swelling or tenderness, except as noted in detailed exam. Integumentary: No impressive skin lesions present, except as noted in detailed exam. Musculoskeletal: Unremarkable, except as noted in detailed exam.  Right elbow exam: Skin inspection of the right elbow is notable for well-healed surgical incision, as well as mild swelling over the posterior aspect of the elbow. There does appear to be a small amount of fluid in the bursal tissue as well as some thickened olecranon bursa. No erythema, ecchymosis, abrasions, or other skin abnormalities are identified. He does have mild-moderate tenderness to palpation over the posterior aspect of the elbow, but there is no tenderness medially, laterally, or anteriorly. He exhibits near full range of motion of the elbow without discomfort and has no elbow effusion. He is able to actively extend the elbow against resistance without pain. He is neurovascular intact to the right upper extremity and hand.  X-rays/MRI/Lab data:  Recent AP, lateral, and oblique views of the right elbow are available for review and have been reviewed by myself. These films demonstrate no evidence for fractures, lytic lesions, or significant degenerative changes.  Assessment: Olecranon bursitis, right elbow   Plan: The treatment options were discussed with the patient and his family. In addition, patient educational materials were provided regarding the diagnosis and treatment options. The patient is quite frustrated by his symptoms and functional limitations, and would like to consider more aggressive treatment options. Therefore, I have recommended a surgical procedure, specifically an excision of the right olecranon bursa. The procedure was discussed with the patient, as were the potential  risks (including bleeding, infection, nerve and/or blood vessel injury, persistent or recurrent pain, recurrent olecranon bursitis, stiffness of the elbow, need for further surgery, blood clots, strokes, heart attacks and/or arhythmias, pneumonia, etc.) and benefits. The patient states his understanding and wishes to proceed. All of the patient's questions and concerns were answered. He can call any time with further concerns. He will follow up post-surgery, routine.    H&P reviewed and patient re-examined. No changes.

## 2021-11-25 NOTE — Anesthesia Procedure Notes (Signed)
Procedure Name: Intubation Date/Time: 11/25/2021 2:37 PM  Performed by: Lowry Bowl, CRNAPre-anesthesia Checklist: Patient identified, Emergency Drugs available, Suction available and Patient being monitored Patient Re-evaluated:Patient Re-evaluated prior to induction Oxygen Delivery Method: Circle system utilized Preoxygenation: Pre-oxygenation with 100% oxygen Induction Type: IV induction, Cricoid Pressure applied and Rapid sequence Laryngoscope Size: McGraph and 4 Grade View: Grade II Tube type: Oral Tube size: 7.5 mm Number of attempts: 1 Airway Equipment and Method: Stylet and Video-laryngoscopy Placement Confirmation: ETT inserted through vocal cords under direct vision, positive ETCO2 and breath sounds checked- equal and bilateral Secured at: 23 cm Tube secured with: Tape Dental Injury: Teeth and Oropharynx as per pre-operative assessment  Difficulty Due To: Difficulty was anticipated and Difficult Airway- due to large tongue Future Recommendations: Recommend- induction with short-acting agent, and alternative techniques readily available

## 2021-11-25 NOTE — Progress Notes (Addendum)
Patient signed AMA form ,Family and patient made aware he can return to the ED if he feels short of breath or has any other issues ,refused Vital signs prior to discharge

## 2021-11-25 NOTE — Discharge Instructions (Addendum)
AMBULATORY SURGERY  DISCHARGE INSTRUCTIONS   The drugs that you were given will stay in your system until tomorrow so for the next 24 hours you should not:  Drive an automobile Make any legal decisions Drink any alcoholic beverage   You may resume regular meals tomorrow.  Today it is better to start with liquids and gradually work up to solid foods.  You may eat anything you prefer, but it is better to start with liquids, then soup and crackers, and gradually work up to solid foods.   Please notify your doctor immediately if you have any unusual bleeding, trouble breathing, redness and pain at the surgery site, drainage, fever, or pain not relieved by medication.    Additional Instructions:  Orthopedic discharge instructions: Keep dressing dry and intact. Keep hand elevated above heart level. May shower after dressing removed on postop day 4 (Sunday). Cover staples with Band-Aids after drying off. Apply ice to affected area frequently. Take ibuprofen 600-800 mg TID with meals for 5-7 days, then as necessary. Take ES Tylenol or pain medication as prescribed when needed.  Return for follow-up in 10-14 days or as scheduled.    Please contact your physician with any problems or Same Day Surgery at [phone], Monday through Friday 6 am to 4 pm, or [place] at [facility] number at [phone].

## 2021-11-25 NOTE — Transfer of Care (Signed)
Immediate Anesthesia Transfer of Care Note  Patient: Alan Eaton  Procedure(s) Performed: OPEN EXCISION OF RIGHT OLECRANON BURSA (Right: Elbow)  Patient Location: PACU  Anesthesia Type:General  Level of Consciousness: awake, drowsy and patient cooperative  Airway & Oxygen Therapy: Patient Spontanous Breathing and Patient connected to face mask oxygen  Post-op Assessment: Report given to RN and Post -op Vital signs reviewed and stable  Post vital signs: Reviewed and stable; BP 152/86  Last Vitals:  Vitals Value Taken Time  BP    Temp 36.4 C 11/25/21 1603  Pulse 106 11/25/21 1603  Resp 20 11/25/21 1603  SpO2 93 % 11/25/21 1603    Last Pain:  Vitals:   11/25/21 1603  TempSrc:   PainSc: 0-No pain         Complications:  Encounter Notable Events  Notable Event Outcome Phase Comment  Difficult to intubate - expected  Intraprocedure Filed from anesthesia note documentation.

## 2021-11-25 NOTE — Op Note (Signed)
11/25/2021  3:38 PM  Patient:   Alan Eaton  Pre-Op Diagnosis:   Chronic sterile olecranon bursitis, right elbow.  Post-Op Diagnosis:   Same  Procedure:   Excision of olecranon bursa, right elbow  Surgeon:   Pascal Lux, MD  Assistant:   None  Anesthesia:   GET  Findings:   As above.  Complications:   None  Fluids:   300 cc crystalloid  EBL:   3 cc  UOP:   None  TT:   28 minutes at 250 mmHg  Drains:   None  Closure:   Staples  Brief Clinical Note:   The patient is a 46 year old male with a history of recurrent painful swelling of the posterior aspect of his right elbow.  The symptoms have persisted despite medications, activity modification, steroid injections, etc.  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  The patient presents at this time for excision of the right olecranon bursa.  Procedure:   The patient was brought into the operating room and laid in the supine position.  After adequate general endotracheal intubation and anesthesia were obtained, the patient was rolled into the left lateral decubitus position and secured using a beanbag.  Care was taken to be to place an axillary roll and to be sure that his lower side and left lower leg was adequately padded.  The left upper extremity was prepped with ChloraPrep solution before being draped sterilely.  A timeout was performed to verify the appropriate surgical site before the limb was exsanguinated with an Esmarch and the tourniquet inflated to 250 mmHg.  Incorporating the prior incision, an approximately 7 to 8 cm curvilinear incision was made over the posterior aspect of the elbow.  The incision was carried down through the subcutaneous tissues to expose the bursa.  This was dissected out circumferentially using both sharp and blunt dissection before the bursa was removed in its entirety.  Hemostasis was achieved using electrocautery.  The  site of the prior underlying triceps tendon repair was carefully inspected and found to be intact, as was the rest of the triceps tendon.  The wound was copiously irrigated with sterile saline solution using bulb irrigation before the subcutaneous tissues were closed using 2-0 Vicryl interrupted sutures.  The skin was closed using staples.  A total of 10 cc of 0.5% plain Sensorcaine was injected in the around the incision to help with postoperative analgesia before a sterile bulky dressing was applied to the elbow.  The patient was then awakened, extubated, and returned to the recovery room in satisfactory condition after tolerating the procedure well.

## 2021-11-25 NOTE — Anesthesia Preprocedure Evaluation (Signed)
Anesthesia Evaluation  Patient identified by MRN, date of birth, ID band Patient awake    Reviewed: Allergy & Precautions, NPO status , Patient's Chart, lab work & pertinent test results  History of Anesthesia Complications Negative for: history of anesthetic complications  Airway Mallampati: IV  TM Distance: >3 FB Neck ROM: full    Dental  (+) Lower Dentures, Upper Dentures, Missing, Dental Advidsory Given   Pulmonary neg shortness of breath, asthma , sleep apnea , Current Smoker and Patient abstained from smoking.,     + decreased breath sounds      Cardiovascular hypertension, (-) angina(-) Past MI and (-) CABG negative cardio ROS Normal cardiovascular exam     Neuro/Psych PSYCHIATRIC DISORDERS negative neurological ROS     GI/Hepatic Neg liver ROS, GERD  Medicated,  Endo/Other  diabetesMorbid obesity  Renal/GU      Musculoskeletal   Abdominal   Peds  Hematology negative hematology ROS (+)   Anesthesia Other Findings Past Medical History: No date: Alcohol abuse No date: Anxiety No date: Bronchitis No date: Depression No date: Diabetes mellitus without complication (HCC)     Comment:  lost weight diet mgmt at present No date: Drug abuse ([place]) No date: GERD (gastroesophageal reflux disease) No date: Gout No date: History of kidney stones No date: Hypertension No date: Morbid obesity ([place]) No date: Pneumonia No date: Suicide ideation  Past Surgical History: No date: none 09/26/2018: TRICEPS TENDON REPAIR; Right     Comment:  Procedure: Debridement and primary repair of               partial-thickness right distal triceps tendon tear. ;                Surgeon: Corky Mull, MD;  Location: [facility] ORS;                Service: Orthopedics;  Laterality: Right; No date: UPPER GASTROINTESTINAL ENDOSCOPY  BMI    Body Mass Index: 38.65 kg/m      Reproductive/Obstetrics negative OB ROS                              Anesthesia Physical Anesthesia Plan  ASA: 3  Anesthesia Plan: General ETT   Post-op Pain Management:    Induction: Intravenous  PONV Risk Score and Plan: 2 and Ondansetron, Dexamethasone, Midazolam and Treatment may vary due to age or medical condition  Airway Management Planned: Oral ETT  Additional Equipment:   Intra-op Plan:   Post-operative Plan: Extubation in OR  Informed Consent: I have reviewed the patients History and Physical, chart, labs and discussed the procedure including the risks, benefits and alternatives for the proposed anesthesia with the patient or authorized representative who has indicated his/her understanding and acceptance.     Dental Advisory Given  Plan Discussed with: Anesthesiologist, CRNA and Surgeon  Anesthesia Plan Comments: (Patient consented for risks of anesthesia including but not limited to:  - adverse reactions to medications - damage to eyes, teeth, lips or other oral mucosa - nerve damage due to positioning  - sore throat or hoarseness - Damage to heart, brain, nerves, lungs, other parts of body or loss of life  Patient voiced understanding.)        Anesthesia Quick Evaluation

## 2021-11-25 NOTE — Progress Notes (Addendum)
Patient's O2 sats 70s to 80s when patient taken to post op. Notified Dr. Amie Critchley. Dr. Amie Critchley discussed with patient risk of leaving with current O2 sats. Patient states he is not going to stay in the hospital. Patient states he will sign AMA form and he is going home. Faythe Dingwall, RN post op nurse and family at bedside and aware. Dr. Roland Rack notified. Patient instructed to return to the emergency room if needed.

## 2021-11-26 ENCOUNTER — Encounter: Payer: Self-pay | Admitting: Surgery

## 2021-11-27 LAB — SURGICAL PATHOLOGY

## 2022-06-21 DEATH — deceased
